# Patient Record
Sex: Female | Born: 1975
Health system: Southern US, Community
[De-identification: ages and names within clinical notes are randomized; demographics above are authoritative.]

## PROBLEM LIST (undated history)

## (undated) ENCOUNTER — Emergency Department (HOSPITAL_COMMUNITY): Admission: EM | Payer: Self-pay | Source: Home / Self Care

## (undated) DIAGNOSIS — E7800 Pure hypercholesterolemia, unspecified: Secondary | ICD-10-CM

## (undated) DIAGNOSIS — F32A Depression, unspecified: Secondary | ICD-10-CM

## (undated) DIAGNOSIS — G510 Bell's palsy: Secondary | ICD-10-CM

## (undated) DIAGNOSIS — J385 Laryngeal spasm: Secondary | ICD-10-CM

## (undated) DIAGNOSIS — I83892 Varicose veins of left lower extremities with other complications: Secondary | ICD-10-CM

## (undated) DIAGNOSIS — D069 Carcinoma in situ of cervix, unspecified: Secondary | ICD-10-CM

## (undated) DIAGNOSIS — K5792 Diverticulitis of intestine, part unspecified, without perforation or abscess without bleeding: Secondary | ICD-10-CM

## (undated) DIAGNOSIS — Z789 Other specified health status: Secondary | ICD-10-CM

## (undated) DIAGNOSIS — C801 Malignant (primary) neoplasm, unspecified: Secondary | ICD-10-CM

## (undated) DIAGNOSIS — F329 Major depressive disorder, single episode, unspecified: Secondary | ICD-10-CM

## (undated) HISTORY — DX: Malignant (primary) neoplasm, unspecified: C80.1

## (undated) HISTORY — DX: Varicose veins of left lower extremity with other complications: I83.892

## (undated) HISTORY — DX: Diverticulitis of intestine, part unspecified, without perforation or abscess without bleeding: K57.92

## (undated) HISTORY — DX: Bell's palsy: G51.0

## (undated) HISTORY — DX: Carcinoma in situ of cervix, unspecified: D06.9

## (undated) HISTORY — DX: Depression, unspecified: F32.A

## (undated) HISTORY — DX: Major depressive disorder, single episode, unspecified: F32.9

---
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
## 2006-07-17 ENCOUNTER — Encounter (HOSPITAL_COMMUNITY): Admission: AD | Admit: 2006-07-17 | Discharge: 2006-07-18 | Payer: Self-pay | Admitting: *Deleted

## 2006-09-29 ENCOUNTER — Ambulatory Visit: Payer: Self-pay | Admitting: Obstetrics & Gynecology

## 2006-10-01 ENCOUNTER — Inpatient Hospital Stay (HOSPITAL_COMMUNITY): Admission: AD | Admit: 2006-10-01 | Discharge: 2006-10-04 | Payer: Self-pay | Admitting: *Deleted

## 2006-10-01 ENCOUNTER — Ambulatory Visit: Payer: Self-pay | Admitting: Obstetrics and Gynecology

## 2006-10-05 ENCOUNTER — Encounter: Payer: Self-pay | Admitting: Vascular Surgery

## 2006-10-17 ENCOUNTER — Ambulatory Visit: Payer: Self-pay | Admitting: *Deleted

## 2006-10-17 ENCOUNTER — Inpatient Hospital Stay (HOSPITAL_COMMUNITY): Admission: AD | Admit: 2006-10-17 | Discharge: 2006-10-18 | Payer: Self-pay | Admitting: Family Medicine

## 2009-12-13 ENCOUNTER — Ambulatory Visit: Payer: Self-pay | Admitting: Obstetrics & Gynecology

## 2009-12-20 ENCOUNTER — Ambulatory Visit (HOSPITAL_COMMUNITY): Admission: RE | Admit: 2009-12-20 | Discharge: 2009-12-20 | Payer: Self-pay | Admitting: Family Medicine

## 2009-12-27 ENCOUNTER — Ambulatory Visit: Payer: Self-pay | Admitting: Obstetrics and Gynecology

## 2011-09-05 LAB — ABO/RH: RH Type: POSITIVE

## 2011-09-05 LAB — HEPATITIS B SURFACE ANTIGEN: Hepatitis B Surface Ag: NEGATIVE

## 2011-09-05 LAB — RPR: RPR: NONREACTIVE

## 2011-09-05 LAB — HIV ANTIBODY (ROUTINE TESTING W REFLEX)
HIV: NONREACTIVE
HIV: NONREACTIVE

## 2011-09-05 LAB — RUBELLA ANTIBODY, IGM: Rubella: IMMUNE

## 2011-09-05 LAB — GC/CHLAMYDIA PROBE AMP, GENITAL
Chlamydia: NEGATIVE
Gonorrhea: NEGATIVE

## 2011-09-05 LAB — ANTIBODY SCREEN: Antibody Screen: NEGATIVE

## 2011-11-18 ENCOUNTER — Inpatient Hospital Stay (HOSPITAL_COMMUNITY): Admission: AD | Admit: 2011-11-18 | Payer: Self-pay | Source: Ambulatory Visit | Admitting: Obstetrics

## 2011-11-19 NOTE — L&D Delivery Note (Signed)
Delivery Note At 2:30 AM a viable female was delivered via Vaginal, Spontaneous Delivery (Presentation: Left Occiput Anterior).  APGAR: 8, 9; weight 7 lb 10 oz (3459 g).   Placenta status: Intact, Spontaneous.  Cord: 3 vessels with the following complications: None.  Cord pH: none  Anesthesia: Local Epidural  Episiotomy: None Lacerations: 2nd degree Suture Repair: 2.0 Est. Blood Loss (mL): 350  Mom to postpartum.  Baby to nursery-stable.  Carlinda Ohlson A 02/03/2012, 2:50 AM

## 2011-12-24 LAB — STREP B DNA PROBE: GBS: NEGATIVE

## 2012-01-21 ENCOUNTER — Encounter (HOSPITAL_COMMUNITY): Payer: Self-pay | Admitting: *Deleted

## 2012-01-21 ENCOUNTER — Inpatient Hospital Stay (HOSPITAL_COMMUNITY)
Admission: AD | Admit: 2012-01-21 | Discharge: 2012-01-21 | Disposition: A | Discharge status: Home / Self Care | Payer: Self-pay | Source: Ambulatory Visit | Attending: Obstetrics | Admitting: Obstetrics

## 2012-01-21 DIAGNOSIS — O368190 Decreased fetal movements, unspecified trimester, not applicable or unspecified: Secondary | ICD-10-CM | POA: Insufficient documentation

## 2012-01-21 HISTORY — DX: Other specified health status: Z78.9

## 2012-01-21 NOTE — Progress Notes (Signed)
Pt states she felt that the baby was moving less yesterday. Pt states she saw the doctor today

## 2012-01-21 NOTE — Discharge Instructions (Signed)
Mtodo para contar los movimientos fetales (Fetal Movement Counts) Nombre de la paciente: __________________________________________________ Fecha probable de parto:____________________ En los embarazos de alto riesgo se recomienda contar las pataditas, pero tambin es una buena idea que lo hagan todas las embarazadas. Comience a contarlas a las 28 semanas de embarazo. Los movimientos fetales aumentan luego de una comida completa o de comer o beber algo dulce (el nivel de azcar en la sangre est ms alto). Tambin es importante beber gran cantidad de lquidos (hidratarse bien) antes de contar. Si se recuesta sobre el lado izquierdo mejorar la circulacin, o puede sentarse en una silla cmoda con los brazos sobre el abdomen y sin distracciones que la rodeen. CONTANDO  Trate de contar a la misma hora todos los das que lo haga.   Marque el da y la hora y vea cunto le lleva sentir 10 movimientos (patadas, agitaciones, sacudones, vueltas). Debe sentir al menos 10 movimientos en 2 horas. Probablemente sienta los 10 movimientos en menos de dos horas. Si no los siente, espere una hora y cuente nuevamente. Luego de algunos das tendr un patrn.   Debemos observar si hay cambios en el patrn o no hay suficientes pataditas en 2 horas. Le lleva ms tiempo contar los 10 movimientos?  SOLICITE ATENCIN MDICA SI:  Siente menos de 10 pataditas en 2 horas. Intntelo dos veces.   No siente movimientos durante 1 hora.   El patrn se modifica o le lleva ms tiempo cada da contar las 10 pataditas.   Siente que el beb no se mueve como lo hace habitualmente.  Fecha: ____________ Movimientos: ____________ Comienzo hora: ____________ Fin hora: ____________ Fecha: ____________ Movimientos: ____________ Comienzo hora: ____________ Fin hora: ____________ Fecha: ____________ Movimientos: ____________ Comienzo hora: ____________ Fin hora: ____________ Fecha: ____________ Movimientos: ____________ Comienzo hora:  ____________ Fin hora: ____________ Fecha: ____________ Movimientos: ____________ Comienzo hora: ____________ Fin hora: ____________ Fecha: ____________ Movimientos: ____________ Comienzo hora: ____________ Fin hora: ____________ Fecha: ____________ Movimientos: ____________ Comienzo hora: ____________ Fin hora: ____________  Fecha: ____________ Movimientos: ____________ Comienzo hora: ____________ Fin hora: ____________ Fecha: ____________ Movimientos: ____________ Comienzo hora: ____________ Fin hora: ____________ Fecha: ____________ Movimientos: ____________ Comienzo hora: ____________ Fin hora: ____________ Fecha: ____________ Movimientos: ____________ Comienzo hora: ____________ Fin hora: ____________ Fecha: ____________ Movimientos: ____________ Comienzo hora: ____________ Fin hora: ____________ Fecha: ____________ Movimientos: ____________ Comienzo hora: ____________ Fin hora: ____________ Fecha: ____________ Movimientos: ____________ Comienzo hora: ____________ Fin hora: ____________  Fecha: ____________ Movimientos: ____________ Comienzo hora: ____________ Fin hora: ____________ Fecha: ____________ Movimientos: ____________ Comienzo hora: ____________ Fin hora: ____________ Fecha: ____________ Movimientos: ____________ Comienzo hora: ____________ Fin hora: ____________ Fecha: ____________ Movimientos: ____________ Comienzo hora: ____________ Fin hora: ____________ Fecha: ____________ Movimientos: ____________ Comienzo hora: ____________ Fin hora: ____________ Fecha: ____________ Movimientos: ____________ Comienzo hora: ____________ Fin hora: ____________ Fecha: ____________ Movimientos: ____________ Comienzo hora: ____________ Fin hora: ____________  Fecha: ____________ Movimientos: ____________ Comienzo hora: ____________ Fin hora: ____________ Fecha: ____________ Movimientos: ____________ Comienzo hora: ____________ Fin hora: ____________ Fecha: ____________ Movimientos: ____________  Comienzo hora: ____________ Fin hora: ____________ Fecha: ____________ Movimientos: ____________ Comienzo hora: ____________ Fin hora: ____________ Fecha: ____________ Movimientos: ____________ Comienzo hora: ____________ Fin hora: ____________ Fecha: ____________ Movimientos: ____________ Comienzo hora: ____________ Fin hora: ____________ Fecha: ____________ Movimientos: ____________ Comienzo hora: ____________ Fin hora: ____________  Fecha: ____________ Movimientos: ____________ Comienzo hora: ____________ Fin hora: ____________ Fecha: ____________ Movimientos: ____________ Comienzo hora: ____________ Fin hora: ____________ Fecha: ____________ Movimientos: ____________ Comienzo hora: ____________ Fin hora: ____________ Fecha: ____________ Movimientos: ____________ Comienzo hora: ____________ Fin hora: ____________ Fecha: ____________ Movimientos:   ____________ Comienzo hora: ____________ Fin hora: ____________ Fecha: ____________ Movimientos: ____________ Comienzo hora: ____________ Fin hora: ____________ Fecha: ____________ Movimientos: ____________ Comienzo hora: ____________ Fin hora: ____________  Fecha: ____________ Movimientos: ____________ Comienzo hora: ____________ Fin hora: ____________ Fecha: ____________ Movimientos: ____________ Comienzo hora: ____________ Fin hora: ____________ Fecha: ____________ Movimientos: ____________ Comienzo hora: ____________ Fin hora: ____________ Fecha: ____________ Movimientos: ____________ Comienzo hora: ____________ Fin hora: ____________ Fecha: ____________ Movimientos: ____________ Comienzo hora: ____________ Fin hora: ____________ Fecha: ____________ Movimientos: ____________ Comienzo hora: ____________ Fin hora: ____________ Fecha: ____________ Movimientos: ____________ Comienzo hora: ____________ Fin hora: ____________  Fecha: ____________ Movimientos: ____________ Comienzo hora: ____________ Fin hora: ____________ Fecha: ____________ Movimientos:  ____________ Comienzo hora: ____________ Fin hora: ____________ Fecha: ____________ Movimientos: ____________ Comienzo hora: ____________ Fin hora: ____________ Fecha: ____________ Movimientos: ____________ Comienzo hora: ____________ Fin hora: ____________ Fecha: ____________ Movimientos: ____________ Comienzo hora: ____________ Fin hora: ____________ Fecha: ____________ Movimientos: ____________ Comienzo hora: ____________ Fin hora: ____________ Fecha: ____________ Movimientos: ____________ Comienzo hora: ____________ Fin hora: ____________  Fecha: ____________ Movimientos: ____________ Comienzo hora: ____________ Fin hora: ____________ Fecha: ____________ Movimientos: ____________ Comienzo hora: ____________ Fin hora: ____________ Fecha: ____________ Movimientos: ____________ Comienzo hora: ____________ Fin hora: ____________ Fecha: ____________ Movimientos: ____________ Comienzo hora: ____________ Fin hora: ____________ Fecha: ____________ Movimientos: ____________ Comienzo hora: ____________ Fin hora: ____________ Fecha: ____________ Movimientos: ____________ Comienzo hora: ____________ Fin hora: ____________ Fecha: ____________ Movimientos: ____________ Comienzo hora: ____________ Fin hora: ____________  Document Released: 02/11/2008 Document Revised: 10/24/2011 [employer] Patient Information 2012 [employer]. 

## 2012-01-21 NOTE — ED Provider Notes (Signed)
History     Chief Complaint  Patient presents with  . Decreased Fetal Movement   HPI This is a 36 y.o. at 39 weeks who presents for NST for history of decreased fetal movment yesterday. Baby is moving well now. Saw Dr Gaynell Face in office today. No contractions, leaking or bleeding.  OB History    Grav Para Term Preterm Abortions TAB SAB Ect Mult Living   2 1 1  0 0 0 0  0 1      Past Medical History  Diagnosis Date  . No pertinent past medical history     Past Surgical History  Procedure Date  . No past surgeries     Family History  Problem Relation Age of Onset  . Diabetes Father   . Cancer Sister     History  Substance Use Topics  . Smoking status: Never Smoker   . Smokeless tobacco: Not on file  . Alcohol Use: No    Allergies: Allergies not on file  No prescriptions prior to admission    ROS As above  Physical Exam   Blood pressure 110/73, pulse 74, temperature 97.3 F (36.3 C), temperature source Oral, resp. rate 18.  Physical Exam  Constitutional: She is oriented to person, place, and time. She appears well-developed and well-nourished. No distress.  HENT:  Head: Normocephalic.  Cardiovascular: Normal rate.   Respiratory: Effort normal.  GI: Soft. She exhibits no distension and no mass. There is no tenderness. There is no rebound and no guarding.  Genitourinary:       FHR reactive with occasional contractions  Musculoskeletal: Normal range of motion.  Neurological: She is alert and oriented to person, place, and time.  Skin: Skin is warm and dry.  Psychiatric: She has a normal mood and affect.    MAU Course  Procedures  Assessment and Plan  A:  SIUP at 39 weeks      Decreased fetal movement yesterday      Reassuring fetal status P:  D/C home      Labor and FM precautions    [facility] 01/21/2012, 5:31 PM

## 2012-01-29 ENCOUNTER — Encounter (HOSPITAL_COMMUNITY): Payer: Self-pay | Admitting: *Deleted

## 2012-01-29 ENCOUNTER — Telehealth (HOSPITAL_COMMUNITY): Payer: Self-pay | Admitting: *Deleted

## 2012-01-29 NOTE — Telephone Encounter (Signed)
Preadmission screen  

## 2012-02-02 ENCOUNTER — Encounter (HOSPITAL_COMMUNITY): Payer: Self-pay | Admitting: *Deleted

## 2012-02-02 ENCOUNTER — Encounter (HOSPITAL_COMMUNITY): Payer: Self-pay | Admitting: Anesthesiology

## 2012-02-02 ENCOUNTER — Inpatient Hospital Stay (HOSPITAL_COMMUNITY): Payer: Medicaid Other | Admitting: Anesthesiology

## 2012-02-02 ENCOUNTER — Inpatient Hospital Stay (HOSPITAL_COMMUNITY)
Admission: AD | Admit: 2012-02-02 | Discharge: 2012-02-04 | DRG: 775 | Disposition: A | Discharge status: Home / Self Care | Payer: Medicaid Other | Source: Ambulatory Visit | Attending: Obstetrics | Admitting: Obstetrics

## 2012-02-02 LAB — CBC
HCT: 42.1 % (ref 36.0–46.0)
Hemoglobin: 14.6 g/dL (ref 12.0–15.0)
MCH: 29.7 pg (ref 26.0–34.0)
MCHC: 34.7 g/dL (ref 30.0–36.0)
MCV: 85.6 fL (ref 78.0–100.0)
Platelets: 241 10*3/uL (ref 150–400)
RBC: 4.92 MIL/uL (ref 3.87–5.11)
RDW: 14.5 % (ref 11.5–15.5)
WBC: 9 10*3/uL (ref 4.0–10.5)

## 2012-02-02 MED ORDER — LACTATED RINGERS IV SOLN
500.0000 mL | INTRAVENOUS | Status: DC | PRN
  Administered 2012-02-02: 500 mL via INTRAVENOUS

## 2012-02-02 MED ORDER — NALBUPHINE SYRINGE 5 MG/0.5 ML: 10.0000 mg | INJECTION | INTRAMUSCULAR | Status: DC | PRN

## 2012-02-02 MED ORDER — EPHEDRINE 5 MG/ML INJ: 10.0000 mg | INTRAVENOUS | Status: DC | PRN

## 2012-02-02 MED ORDER — NALBUPHINE HCL 10 MG/ML IJ SOLN: 10.0000 mg | Freq: Four times a day (QID) | INTRAMUSCULAR | Status: DC | PRN

## 2012-02-02 MED ORDER — ONDANSETRON HCL 4 MG/2ML IJ SOLN: 4.0000 mg | Freq: Four times a day (QID) | INTRAMUSCULAR | Status: DC | PRN

## 2012-02-02 MED ORDER — OXYCODONE-ACETAMINOPHEN 5-325 MG PO TABS
1.0000 | ORAL_TABLET | ORAL | Status: DC | PRN
  Administered 2012-02-03: 1 via ORAL
  Filled 2012-02-02: qty 1

## 2012-02-02 MED ORDER — DIPHENHYDRAMINE HCL 50 MG/ML IJ SOLN: 12.5000 mg | INTRAMUSCULAR | Status: DC | PRN

## 2012-02-02 MED ORDER — LACTATED RINGERS IV SOLN
INTRAVENOUS | Status: DC
  Administered 2012-02-02: 125 mL/h via INTRAVENOUS

## 2012-02-02 MED ORDER — HYDROXYZINE HCL 50 MG/ML IM SOLN: 50.0000 mg | Freq: Four times a day (QID) | INTRAMUSCULAR | Status: DC | PRN

## 2012-02-02 MED ORDER — LACTATED RINGERS IV SOLN
500.0000 mL | Freq: Once | INTRAVENOUS | Status: AC
  Administered 2012-02-03: 500 mL via INTRAVENOUS

## 2012-02-02 MED ORDER — FENTANYL 2.5 MCG/ML BUPIVACAINE 1/10 % EPIDURAL INFUSION (WH - ANES)
14.0000 mL/h | INTRAMUSCULAR | Status: DC
  Administered 2012-02-02: 14 mL/h via EPIDURAL
  Filled 2012-02-02 (×2): qty 60

## 2012-02-02 MED ORDER — IBUPROFEN 600 MG PO TABS: 600.0000 mg | ORAL_TABLET | Freq: Four times a day (QID) | ORAL | Status: DC | PRN

## 2012-02-02 MED ORDER — OXYTOCIN BOLUS FROM INFUSION
500.0000 mL | Freq: Once | INTRAVENOUS | Status: DC
  Filled 2012-02-02: qty 500
  Filled 2012-02-02: qty 1000

## 2012-02-02 MED ORDER — OXYTOCIN 20 UNITS IN LACTATED RINGERS INFUSION - SIMPLE: 125.0000 mL/h | Freq: Once | INTRAVENOUS | Status: DC

## 2012-02-02 MED ORDER — FLEET ENEMA 7-19 GM/118ML RE ENEM: 1.0000 | ENEMA | RECTAL | Status: DC | PRN

## 2012-02-02 MED ORDER — LIDOCAINE HCL (PF) 1 % IJ SOLN
30.0000 mL | INTRAMUSCULAR | Status: DC | PRN
  Administered 2012-02-03: 30 mL via SUBCUTANEOUS
  Filled 2012-02-02: qty 30

## 2012-02-02 MED ORDER — LIDOCAINE HCL (PF) 1 % IJ SOLN
INTRAMUSCULAR | Status: DC | PRN
  Administered 2012-02-02 (×3): 4 mL

## 2012-02-02 MED ORDER — EPHEDRINE 5 MG/ML INJ
10.0000 mg | INTRAVENOUS | Status: DC | PRN
  Filled 2012-02-02: qty 4

## 2012-02-02 MED ORDER — CITRIC ACID-SODIUM CITRATE 334-500 MG/5ML PO SOLN: 30.0000 mL | ORAL | Status: DC | PRN

## 2012-02-02 MED ORDER — ACETAMINOPHEN 325 MG PO TABS: 650.0000 mg | ORAL_TABLET | ORAL | Status: DC | PRN

## 2012-02-02 MED ORDER — PHENYLEPHRINE 40 MCG/ML (10ML) SYRINGE FOR IV PUSH (FOR BLOOD PRESSURE SUPPORT): 80.0000 ug | PREFILLED_SYRINGE | INTRAVENOUS | Status: DC | PRN

## 2012-02-02 MED ORDER — PHENYLEPHRINE 40 MCG/ML (10ML) SYRINGE FOR IV PUSH (FOR BLOOD PRESSURE SUPPORT)
80.0000 ug | PREFILLED_SYRINGE | INTRAVENOUS | Status: DC | PRN
  Filled 2012-02-02: qty 5

## 2012-02-02 NOTE — MAU Note (Signed)
Pt G2 P1 at 41wks, having contractions every 6-75min since this am.  Denies leaking.  Reports small amt of bloody show this am.  Denies any problems with pregnancy.

## 2012-02-02 NOTE — Anesthesia Preprocedure Evaluation (Signed)
Anesthesia Evaluation  Patient identified by MRN, date of birth, ID band Patient awake    Reviewed: Allergy & Precautions, H&P , NPO status , Patient's Chart, lab work & pertinent test results, reviewed documented beta blocker date and time   History of Anesthesia Complications Negative for: history of anesthetic complications  Airway Mallampati: I TM Distance: >3 FB Neck ROM: full    Dental  (+) Teeth Intact   Pulmonary neg pulmonary ROS,  breath sounds clear to auscultation        Cardiovascular negative cardio ROS  Rhythm:regular Rate:Normal     Neuro/Psych negative neurological ROS  negative psych ROS   GI/Hepatic negative GI ROS, Neg liver ROS,   Endo/Other  negative endocrine ROS  Renal/GU negative Renal ROS     Musculoskeletal   Abdominal   Peds  Hematology negative hematology ROS (+)   Anesthesia Other Findings   Reproductive/Obstetrics (+) Pregnancy                           Anesthesia Physical Anesthesia Plan  ASA: II  Anesthesia Plan: Epidural   Post-op Pain Management:    Induction:   Airway Management Planned:   Additional Equipment:   Intra-op Plan:   Post-operative Plan:   Informed Consent: I have reviewed the patients History and Physical, chart, labs and discussed the procedure including the risks, benefits and alternatives for the proposed anesthesia with the patient or authorized representative who has indicated his/her understanding and acceptance.     Plan Discussed with:   Anesthesia Plan Comments:         Anesthesia Quick Evaluation  

## 2012-02-02 NOTE — Progress Notes (Signed)
Dr. Clearance Coots on the phone and notified of pt status, epidural placement, FHR, elevated baseline, maternal temp WNL, UC pattern, and SVE.

## 2012-02-02 NOTE — Anesthesia Procedure Notes (Signed)
Epidural Patient location during procedure: OB Start time: 02/02/2012 9:33 PM Reason for block: procedure for pain  Staffing Performed by: anesthesiologist   Preanesthetic Checklist Completed: patient identified, site marked, surgical consent, pre-op evaluation, timeout performed, IV checked, risks and benefits discussed and monitors and equipment checked  Epidural Patient position: sitting Prep: site prepped and draped and DuraPrep Patient monitoring: continuous pulse ox and blood pressure Approach: midline Injection technique: LOR air  Needle:  Needle type: Tuohy  Needle gauge: 17 G Needle length: 9 cm Needle insertion depth: 4 cm Catheter type: closed end flexible Catheter size: 19 Gauge Catheter at skin depth: 9 cm Test dose: negative  Assessment Events: blood not aspirated, injection not painful, no injection resistance, negative IV test and no paresthesia  Additional Notes Discussed risk of headache, infection, bleeding, nerve injury and failed or incomplete block.  Patient voices understanding and wishes to proceed.

## 2012-02-03 ENCOUNTER — Encounter (HOSPITAL_COMMUNITY): Payer: Self-pay | Admitting: Family Medicine

## 2012-02-03 ENCOUNTER — Inpatient Hospital Stay (HOSPITAL_COMMUNITY): Admission: RE | Admit: 2012-02-03 | Payer: Self-pay | Source: Ambulatory Visit

## 2012-02-03 LAB — CBC
HCT: 36.8 % (ref 36.0–46.0)
Hemoglobin: 12.6 g/dL (ref 12.0–15.0)
MCH: 29.6 pg (ref 26.0–34.0)
MCHC: 34.2 g/dL (ref 30.0–36.0)
MCV: 86.4 fL (ref 78.0–100.0)
Platelets: 198 10*3/uL (ref 150–400)
RBC: 4.26 MIL/uL (ref 3.87–5.11)
RDW: 14.2 % (ref 11.5–15.5)
WBC: 12.5 10*3/uL — ABNORMAL HIGH (ref 4.0–10.5)

## 2012-02-03 LAB — RPR: RPR Ser Ql: NONREACTIVE

## 2012-02-03 MED ORDER — ONDANSETRON HCL 4 MG PO TABS: 4.0000 mg | ORAL_TABLET | ORAL | Status: DC | PRN

## 2012-02-03 MED ORDER — TETANUS-DIPHTH-ACELL PERTUSSIS 5-2.5-18.5 LF-MCG/0.5 IM SUSP
0.5000 mL | Freq: Once | INTRAMUSCULAR | Status: AC
  Administered 2012-02-04: 0.5 mL via INTRAMUSCULAR
  Filled 2012-02-03: qty 0.5

## 2012-02-03 MED ORDER — PRENATAL MULTIVITAMIN CH
1.0000 | ORAL_TABLET | Freq: Every day | ORAL | Status: DC
  Administered 2012-02-03 – 2012-02-04 (×2): 1 via ORAL
  Filled 2012-02-03 (×2): qty 1

## 2012-02-03 MED ORDER — IBUPROFEN 600 MG PO TABS
600.0000 mg | ORAL_TABLET | Freq: Four times a day (QID) | ORAL | Status: DC
  Administered 2012-02-03 – 2012-02-04 (×6): 600 mg via ORAL
  Filled 2012-02-03 (×6): qty 1

## 2012-02-03 MED ORDER — SIMETHICONE 80 MG PO CHEW: 80.0000 mg | CHEWABLE_TABLET | ORAL | Status: DC | PRN

## 2012-02-03 MED ORDER — DIBUCAINE 1 % RE OINT
1.0000 "application " | TOPICAL_OINTMENT | RECTAL | Status: DC | PRN
  Administered 2012-02-03: 1 via RECTAL
  Filled 2012-02-03 (×2): qty 28

## 2012-02-03 MED ORDER — METHYLERGONOVINE MALEATE 0.2 MG PO TABS: 0.2000 mg | ORAL_TABLET | ORAL | Status: DC | PRN

## 2012-02-03 MED ORDER — MEDROXYPROGESTERONE ACETATE 150 MG/ML IM SUSP: 150.0000 mg | INTRAMUSCULAR | Status: DC | PRN

## 2012-02-03 MED ORDER — OXYCODONE-ACETAMINOPHEN 5-325 MG PO TABS
1.0000 | ORAL_TABLET | ORAL | Status: DC | PRN
  Administered 2012-02-03: 1 via ORAL
  Filled 2012-02-03: qty 1

## 2012-02-03 MED ORDER — OXYTOCIN 10 UNIT/ML IJ SOLN
40.0000 [IU] | INTRAVENOUS | Status: DC
  Administered 2012-02-03: 40 [IU] via INTRAVENOUS
  Filled 2012-02-03: qty 4

## 2012-02-03 MED ORDER — BENZOCAINE-MENTHOL 20-0.5 % EX AERO
INHALATION_SPRAY | CUTANEOUS | Status: AC
  Administered 2012-02-03: 1 via TOPICAL
  Filled 2012-02-03: qty 56

## 2012-02-03 MED ORDER — METHYLERGONOVINE MALEATE 0.2 MG/ML IJ SOLN: 0.2000 mg | INTRAMUSCULAR | Status: DC | PRN

## 2012-02-03 MED ORDER — ZOLPIDEM TARTRATE 5 MG PO TABS: 5.0000 mg | ORAL_TABLET | Freq: Every evening | ORAL | Status: DC | PRN

## 2012-02-03 MED ORDER — WITCH HAZEL-GLYCERIN EX PADS: 1.0000 "application " | MEDICATED_PAD | CUTANEOUS | Status: DC | PRN

## 2012-02-03 MED ORDER — ONDANSETRON HCL 4 MG/2ML IJ SOLN: 4.0000 mg | INTRAMUSCULAR | Status: DC | PRN

## 2012-02-03 MED ORDER — OXYTOCIN 20 UNITS IN LACTATED RINGERS INFUSION - SIMPLE: 125.0000 mL/h | INTRAVENOUS | Status: DC | PRN

## 2012-02-03 MED ORDER — SENNOSIDES-DOCUSATE SODIUM 8.6-50 MG PO TABS
2.0000 | ORAL_TABLET | Freq: Every day | ORAL | Status: DC
  Administered 2012-02-03: 2 via ORAL

## 2012-02-03 MED ORDER — DIPHENHYDRAMINE HCL 25 MG PO CAPS: 25.0000 mg | ORAL_CAPSULE | Freq: Four times a day (QID) | ORAL | Status: DC | PRN

## 2012-02-03 MED ORDER — LANOLIN HYDROUS EX OINT: TOPICAL_OINTMENT | CUTANEOUS | Status: DC | PRN

## 2012-02-03 MED ORDER — BENZOCAINE-MENTHOL 20-0.5 % EX AERO
1.0000 "application " | INHALATION_SPRAY | CUTANEOUS | Status: DC | PRN
  Administered 2012-02-03: 1 via TOPICAL
  Filled 2012-02-03: qty 56

## 2012-02-03 NOTE — Progress Notes (Signed)
Catrice Zuleta is a 36 y.o. G2P1001 at [redacted]w[redacted]d by LMP admitted for active labor  Subjective:   Objective: BP 132/87  Pulse 71  Temp(Src) 97.9 F (36.6 C) (Oral)  Resp 18  Ht 5\' 2"  (1.575 m)  Wt 138 lb (62.596 kg)  BMI 25.24 kg/m2  SpO2 100%   Total I/O In: -  Out: 600 [Urine:600]  FHT:  FHR: 150 bpm, variability: moderate,  accelerations:  Present,  decelerations:  Absent UC:   regular, every 2-5 minutes SVE:   Dilation: 10 Effacement (%): 90 Station: +2 Exam by:: Dr. Clearance Coots  Labs: Lab Results  Component Value Date   WBC 9.0 02/02/2012   HGB 14.6 02/02/2012   HCT 42.1 02/02/2012   MCV 85.6 02/02/2012   PLT 241 02/02/2012    Assessment / Plan: Spontaneous labor, progressing normally  Labor: Progressing normally Preeclampsia:  n/a Fetal Wellbeing:  Category I Pain Control:  Epidural I/D:  n/a Anticipated MOD:  NSVD  Giordan Fordham A 02/03/2012, 2:21 AM

## 2012-02-03 NOTE — Progress Notes (Signed)
NSVD of a viable female.  

## 2012-02-03 NOTE — Anesthesia Postprocedure Evaluation (Signed)
  Anesthesia Post-op Note  Patient: Alicia Miller  Procedure(s) Performed: * No surgery found *  Patient Location: Mother/Baby  Anesthesia Type: Epidural  Level of Consciousness: awake  Airway and Oxygen Therapy: Patient Spontanous Breathing  Post-op Pain: none  Post-op Assessment: Post-op Vital signs reviewed, Respiratory Function Stable, No signs of Nausea or vomiting, Adequate PO intake, Pain level controlled, No headache, No backache, No residual numbness and No residual motor weakness  Post-op Vital Signs: Reviewed and stable  Complications: No apparent anesthesia complications

## 2012-02-03 NOTE — H&P (Signed)
Alicia Miller is a 36 y.o. female presenting for UC's.. Maternal Medical History:  Reason for admission: Reason for admission: contractions.  Contractions: Onset was 3-5 hours ago.   Frequency: regular.   Duration is approximately 1 minute.   Perceived severity is moderate.    Fetal activity: Perceived fetal activity is normal.   Last perceived fetal movement was within the past hour.    Prenatal complications: no prenatal complications   OB History    Grav Para Term Preterm Abortions TAB SAB Ect Mult Living   2 1 1  0 0 0 0  0 1     Past Medical History  Diagnosis Date  . No pertinent past medical history    Past Surgical History  Procedure Date  . No past surgeries    Family History: family history includes Cancer in her sister and Diabetes in her father. Social History:  reports that she has never smoked. She has never used smokeless tobacco. She reports that she does not drink alcohol or use illicit drugs.  Review of Systems  All other systems reviewed and are negative.    Dilation: 10 Effacement (%): 90 Station: +1 Exam by:: T. Lessard RN Blood pressure 132/87, pulse 71, temperature 97.9 F (36.6 C), temperature source Oral, resp. rate 18, height 5\' 2"  (1.575 m), weight 138 lb (62.596 kg), SpO2 100.00%. Maternal Exam:  Uterine Assessment: Contraction strength is moderate.  Contraction duration is 1 minute. Contraction frequency is regular.   Abdomen: Patient reports no abdominal tenderness. Fetal presentation: vertex  Introitus: Normal vulva. Normal vagina.    Physical Exam  Nursing note and vitals reviewed. Constitutional: She appears well-developed and well-nourished.  Cardiovascular: Normal rate.   Respiratory: Effort normal.  GI: Soft.  Genitourinary: Vagina normal and uterus normal.  Skin: Skin is warm and dry.  Psychiatric: She has a normal mood and affect. Her behavior is normal. Judgment and thought content normal.    Prenatal labs: ABO,  Rh: B/Positive/-- (10/18 0000) Antibody: Negative (10/18 0000) Rubella: Immune (10/18 0000) RPR: NON REACTIVE (03/17 2034)  HBsAg: Negative (10/18 0000)  HIV: Non-reactive, Non-reactive (10/18 0000)  GBS: Negative (02/05 0000)   Assessment/Plan: Term pregnancy.  Active labor.  Expectant.   Alicia Miller A 02/03/2012, 2:18 AM

## 2012-02-03 NOTE — Progress Notes (Signed)
UR chart review completed.  

## 2012-02-04 NOTE — Discharge Instructions (Signed)
Discharge instructions   You can wash your hair  Shower  Eat what you want  Drink what you want  See me in 6 weeks  Your ankles are going to swell more in the next 2 weeks than when pregnant  No sex for 6 weeks   Neva Ramaswamy A, MD 02/04/2012

## 2012-02-04 NOTE — Discharge Summary (Signed)
Obstetric Discharge Summary Reason for Admission: onset of labor Prenatal Procedures: none Intrapartum Procedures: spontaneous vaginal delivery Postpartum Procedures: none Complications-Operative and Postpartum: none Hemoglobin  Date Value Range Status  02/03/2012 12.6  12.0-15.0 (g/dL) Final     HCT  Date Value Range Status  02/03/2012 36.8  36.0-46.0 (%) Final    Physical Exam:  General: alert Lochia: appropriate Uterine Fundus: firm Incision: healing well DVT Evaluation: No evidence of DVT seen on physical exam.  Discharge Diagnoses: Term Pregnancy-delivered  Discharge Information: Date: 02/04/2012 Activity: pelvic rest Diet: routine Medications: Percocet Condition: stable Instructions: refer to practice specific booklet Discharge to: home Follow-up Information    Follow up with Amadu Schlageter A, MD. Call in 6 weeks.   Contact information:   [address] [place] [address] [phone]          Newborn Data: Live born female  Birth Weight: 7 lb 10 oz (3459 g) APGAR: 8, 9  Home with mother.  Eural Holzschuh A 02/04/2012, 6:47 AM

## 2012-11-30 ENCOUNTER — Ambulatory Visit: Payer: Self-pay | Admitting: Family Medicine

## 2012-11-30 VITALS — BP 100/70 | HR 81 | Temp 98.6°F | Resp 18 | Wt 123.0 lb

## 2012-11-30 DIAGNOSIS — R21 Rash and other nonspecific skin eruption: Secondary | ICD-10-CM

## 2012-11-30 DIAGNOSIS — R0789 Other chest pain: Secondary | ICD-10-CM

## 2012-11-30 DIAGNOSIS — I776 Arteritis, unspecified: Secondary | ICD-10-CM

## 2012-11-30 LAB — POCT CBC
Granulocyte percent: 75 %G (ref 37–80)
HCT, POC: 48.5 % — AB (ref 37.7–47.9)
Hemoglobin: 15.4 g/dL (ref 12.2–16.2)
Lymph, poc: 2.5 (ref 0.6–3.4)
MCH, POC: 28.6 pg (ref 27–31.2)
MCHC: 31.8 g/dL (ref 31.8–35.4)
MCV: 90.1 fL (ref 80–97)
MID (cbc): 0.6 (ref 0–0.9)
MPV: 8.4 fL (ref 0–99.8)
POC Granulocyte: 9.3 — AB (ref 2–6.9)
POC LYMPH PERCENT: 19.9 %L (ref 10–50)
POC MID %: 5.1 %M (ref 0–12)
Platelet Count, POC: 475 10*3/uL — AB (ref 142–424)
RBC: 4.38 M/uL (ref 4.04–5.48)
RDW, POC: 12.4 %
WBC: 12.4 10*3/uL — AB (ref 4.6–10.2)

## 2012-11-30 LAB — POCT SEDIMENTATION RATE: POCT SED RATE: 41 mm/hr — AB (ref 0–22)

## 2012-11-30 MED ORDER — PREDNISONE 20 MG PO TABS: ORAL_TABLET | ORAL | Status: DC

## 2012-11-30 NOTE — Progress Notes (Signed)
Subjective: Patient is here with a rash on her legs it is been there for several weeks. His gotten gotten worse. It itches. Her legs now pain. She thought she gotten bit by her neck on her shoulder by some spiders. She had a couple of bumps there apparently. There is nothing left that I can see from that. She never saw a spider. She also is been having chest pain behind the left breast. Has been going on for an indefinite long time. It hurts sometimes when she takes a deep breath. It seems to be a stabbing pain. She did not speak English very well, but little he helps translate as did the patient's friend who was with her.  Objective: Shows a striking vasculitic-appearing rash on her ankles. Not much on the feet. No the palms or soles. The worst is on the left ankle. A photo was taken. She has little splotches on the upper leg. The big biggest area shows some linear excoriations. There is no blanching of the rash. No nodes were noted. She has a few scattered little spots is caused her groin. None on her chest or back. Her left arm may be starting to get some, a little difficult to tell.  Her chest is clear. Heart regular without murmurs. Mild tenderness behind the left breast, above the nipple line.  Assessment: Vasculitic rash Atypical chest pain History of present possible spider bites  Plan: Await for additional labs to return. In the meanwhile went ahead and placed on prednisone tapered dose to help with pain and maybe the rash. She may well require referral to a specialist, which cannot afford. Suggested that they consider getting on Obamacare.       Results for orders placed in visit on 11/30/12  POCT CBC      Component Value Range   WBC 12.4 (*) 4.6 - 10.2 K/uL   Lymph, poc 2.5  0.6 - 3.4   POC LYMPH PERCENT 19.9  10 - 50 %L   MID (cbc) 0.6  0 - 0.9   POC MID % 5.1  0 - 12 %M   POC Granulocyte 9.3 (*) 2 - 6.9   Granulocyte percent 75.0  37 - 80 %G   RBC 4.38  4.04 - 5.48 M/uL   Hemoglobin 15.4  12.2 - 16.2 g/dL   HCT, POC 16.1 (*) 09.6 - 47.9 %   MCV 90.1  80 - 97 fL   MCH, POC 28.6  27 - 31.2 pg   MCHC 31.8  31.8 - 35.4 g/dL   RDW, POC 04.5     Platelet Count, POC 475 (*) 142 - 424 K/uL   MPV 8.4  0 - 99.8 fL

## 2012-11-30 NOTE — Patient Instructions (Signed)
Take the prednisone as directed for the vasculitis rash.  Return in one week for recheck, sooner if worse

## 2012-12-01 LAB — COMPREHENSIVE METABOLIC PANEL
ALT: 9 U/L (ref 0–35)
AST: 12 U/L (ref 0–37)
Albumin: 4.6 g/dL (ref 3.5–5.2)
Alkaline Phosphatase: 58 U/L (ref 39–117)
BUN: 17 mg/dL (ref 6–23)
CO2: 24 mEq/L (ref 19–32)
Calcium: 9.7 mg/dL (ref 8.4–10.5)
Chloride: 103 mEq/L (ref 96–112)
Creat: 0.66 mg/dL (ref 0.50–1.10)
Glucose, Bld: 96 mg/dL (ref 70–99)
Potassium: 4.4 mEq/L (ref 3.5–5.3)
Sodium: 138 mEq/L (ref 135–145)
Total Bilirubin: 0.4 mg/dL (ref 0.3–1.2)
Total Protein: 7.6 g/dL (ref 6.0–8.3)

## 2012-12-01 LAB — ANA: Anti Nuclear Antibody(ANA): NEGATIVE

## 2012-12-07 ENCOUNTER — Ambulatory Visit: Payer: Self-pay | Admitting: Family Medicine

## 2012-12-07 VITALS — BP 106/74 | HR 92 | Temp 98.4°F | Resp 16 | Ht 63.0 in | Wt 124.4 lb

## 2012-12-07 DIAGNOSIS — F419 Anxiety disorder, unspecified: Secondary | ICD-10-CM

## 2012-12-07 DIAGNOSIS — F411 Generalized anxiety disorder: Secondary | ICD-10-CM

## 2012-12-07 DIAGNOSIS — R21 Rash and other nonspecific skin eruption: Secondary | ICD-10-CM

## 2012-12-07 NOTE — Progress Notes (Signed)
Subjective: Patient is here for recheck of the rash on her legs. She still has concerns about the rash. She is getting her friend to interpret further in Spanish on the phone. Patient reports having been a little bit lightheaded and some tingling in her mouth.  Objective: Rash is much improved. On a mild brownish discoloration remaining in the area that was so inflamed. Her previous sedimentation rate was slightly high at 41 and the CBC had a slight elevated white blood count, both of which would be expected with the degree of inflammation that she had then. Do not believe that other labs are needed at this time. Chest is clear. Heart regular. No carotid bruits.  Assessment: Dermatitis, probably allergic, etiology not totally defined, resolving. Anxiety  Plan: I do not feel like any further testing or biopsies are indicated at this time. We'll just watch and see if anything comes back.

## 2012-12-07 NOTE — Patient Instructions (Addendum)
Return if worse or if rash reoccurs.

## 2013-01-08 ENCOUNTER — Ambulatory Visit: Payer: Self-pay | Admitting: Internal Medicine

## 2013-01-08 VITALS — BP 99/61 | HR 74 | Temp 98.6°F | Resp 18 | Wt 124.0 lb

## 2013-01-08 DIAGNOSIS — R109 Unspecified abdominal pain: Secondary | ICD-10-CM

## 2013-01-08 DIAGNOSIS — R1013 Epigastric pain: Secondary | ICD-10-CM

## 2013-01-08 LAB — POCT UA - MICROSCOPIC ONLY
Bacteria, U Microscopic: NEGATIVE
Casts, Ur, LPF, POC: NEGATIVE
Crystals, Ur, HPF, POC: NEGATIVE
Mucus, UA: NEGATIVE
WBC, Ur, HPF, POC: NEGATIVE
Yeast, UA: NEGATIVE

## 2013-01-08 LAB — POCT CBC
Granulocyte percent: 77 %G (ref 37–80)
HCT, POC: 46.4 % (ref 37.7–47.9)
Hemoglobin: 14.8 g/dL (ref 12.2–16.2)
Lymph, poc: 1.5 (ref 0.6–3.4)
MCH, POC: 28.5 pg (ref 27–31.2)
MCHC: 31.9 g/dL (ref 31.8–35.4)
MCV: 89.2 fL (ref 80–97)
MID (cbc): 0.5 (ref 0–0.9)
MPV: 9.3 fL (ref 0–99.8)
POC Granulocyte: 6.8 (ref 2–6.9)
POC LYMPH PERCENT: 17.3 %L (ref 10–50)
POC MID %: 5.7 %M (ref 0–12)
Platelet Count, POC: 378 10*3/uL (ref 142–424)
RBC: 5.2 M/uL (ref 4.04–5.48)
RDW, POC: 13.2 %
WBC: 8.8 10*3/uL (ref 4.6–10.2)

## 2013-01-08 LAB — POCT URINALYSIS DIPSTICK
Bilirubin, UA: NEGATIVE
Glucose, UA: NEGATIVE
Ketones, UA: NEGATIVE
Leukocytes, UA: NEGATIVE
Nitrite, UA: NEGATIVE
Protein, UA: NEGATIVE
Spec Grav, UA: 1.01
Urobilinogen, UA: 0.2
pH, UA: 7

## 2013-01-08 MED ORDER — MELOXICAM 15 MG PO TABS: 15.0000 mg | ORAL_TABLET | Freq: Every day | ORAL | Status: DC

## 2013-01-08 MED ORDER — CYCLOBENZAPRINE HCL 10 MG PO TABS: 10.0000 mg | ORAL_TABLET | Freq: Every day | ORAL | Status: DC

## 2013-01-08 NOTE — Progress Notes (Signed)
Subjective:    Patient ID: Alicia Miller, female    DOB: 1976-07-08, 37 y.o.   MRN: [id number]  HPI  Patient comes in today with pain in her upper abdominal area that connects straight through her back. She denies any nausea or vomiting. No changes in her diet. She states it is just a constant pain. It does not get better of worse. She is unable to sleep well. She thinks it may be from eating spicy foods frequently. It hurts to take a deep breath. There is more pain in her back than in the abdomen with movement. She feels more pain if the area is pressed on. If she twists her waist she feels the pain sharply. Pain with and without movement.   She has a sore throat also. It hurts to swallow sometimes. It has been hurting for a while.   She has had complaints of headaches especially at night. No chills, night sweats, fever.   Review of Systems  Constitutional: Negative for fever, chills, appetite change, fatigue and unexpected weight change.  HENT: Negative for congestion, rhinorrhea, sneezing, neck pain, neck stiffness, voice change and postnasal drip.   Gastrointestinal: Negative for nausea, vomiting, diarrhea, constipation and blood in stool.  Genitourinary: Negative for dysuria and frequency.       Objective:   Physical Exam BP 99/61  Pulse 74  Temp(Src) 98.6 F (37 C) (Oral)  Resp 18  Wt 124 lb (56.246 kg)  BMI 21.97 kg/m2  LMP 12/24/2012  Breastfeeding? No HEENT= clear No nodes or thyromegaly Heart regular without murmur Lungs clear Abdomen soft/tender to palpation in the epigastrium/no rebound tenderness/no percussion tenderness  Also tender along the lower rib margins bilaterally to the mid thoracic area/rotation increases  this discomfort/bowel sounds present/no organomegaly/lower quadrants clear Mild flank tenderness to CVA percussion bilaterally Straight leg raise negative Lumbosacral area nontender Skin clear       Results for orders placed in visit on  01/08/13  POCT CBC      Result Value Range   WBC 8.8  4.6 - 10.2 K/uL   Lymph, poc 1.5  0.6 - 3.4   POC LYMPH PERCENT 17.3  10 - 50 %L   MID (cbc) 0.5  0 - 0.9   POC MID % 5.7  0 - 12 %M   POC Granulocyte 6.8  2 - 6.9   Granulocyte percent 77.0  37 - 80 %G   RBC 5.20  4.04 - 5.48 M/uL   Hemoglobin 14.8  12.2 - 16.2 g/dL   HCT, POC 30.8  65.7 - 47.9 %   MCV 89.2  80 - 97 fL   MCH, POC 28.5  27 - 31.2 pg   MCHC 31.9  31.8 - 35.4 g/dL   RDW, POC 84.6     Platelet Count, POC 378  142 - 424 K/uL   MPV 9.3  0 - 99.8 fL  POCT URINALYSIS DIPSTICK      Result Value Range   Color, UA yellow     Clarity, UA clear     Glucose, UA neg     Bilirubin, UA neg     Ketones, UA neg     Spec Grav, UA 1.010     Blood, UA trace-lysed     pH, UA 7.0     Protein, UA neg     Urobilinogen, UA 0.2     Nitrite, UA neg     Leukocytes, UA Negative    POCT UA - MICROSCOPIC  ONLY      Result Value Range   WBC, Ur, HPF, POC neg     RBC, urine, microscopic 0-1     Bacteria, U Microscopic neg     Mucus, UA neg     Epithelial cells, urine per micros 0-1     Crystals, Ur, HPF, POC neg     Casts, Ur, LPF, POC neg     Yeast, UA neg       Assessment & Plan:  Epigastric abdominal pain Flank pain Thoracic pain-musculoskeletal  Meds ordered this encounter  Medications  . meloxicam (MOBIC) 15 MG tablet    Sig: Take 1 tablet (15 mg total) by mouth daily.    Dispense:  14 tablet    Refill:  0  . cyclobenzaprine (FLEXERIL) 10 MG tablet    Sig: Take 1 tablet (10 mg total) by mouth at bedtime.    Dispense:  14 tablet    Refill:  0     Etiology uncertain/pain characteristics favor a musculoskeletal but history favors gastrointestinal Will need close followup/recheck Tues At 4 PM-sooner if worse/we will have Spanish speakers in the building that afternoon I discussed this with her friend translating by phone

## 2014-01-04 ENCOUNTER — Ambulatory Visit: Payer: Medicaid Other

## 2014-01-05 ENCOUNTER — Ambulatory Visit: Payer: No Typology Code available for payment source | Attending: Internal Medicine

## 2014-01-20 ENCOUNTER — Encounter: Payer: Self-pay | Admitting: Family Medicine

## 2014-01-20 ENCOUNTER — Ambulatory Visit: Payer: No Typology Code available for payment source | Attending: Internal Medicine | Admitting: Family Medicine

## 2014-01-20 VITALS — BP 117/79 | HR 74 | Temp 98.0°F | Resp 14 | Ht 62.0 in | Wt 127.2 lb

## 2014-01-20 DIAGNOSIS — R209 Unspecified disturbances of skin sensation: Secondary | ICD-10-CM

## 2014-01-20 DIAGNOSIS — G589 Mononeuropathy, unspecified: Secondary | ICD-10-CM | POA: Insufficient documentation

## 2014-01-20 DIAGNOSIS — R109 Unspecified abdominal pain: Secondary | ICD-10-CM | POA: Insufficient documentation

## 2014-01-20 DIAGNOSIS — Z008 Encounter for other general examination: Secondary | ICD-10-CM | POA: Insufficient documentation

## 2014-01-20 DIAGNOSIS — R200 Anesthesia of skin: Secondary | ICD-10-CM

## 2014-01-20 NOTE — Progress Notes (Signed)
Subjective:     Patient ID: Giuliana Handyside, female   DOB: January 27, 1976, 38 y.o.   MRN: [id number]  HPI Mrs [name] presents today with acute abdominal pain. Her pain is located in her left upper quadrant and started the night of 01-19-14 and she describes it as sharp. It has no aggravating factors and is relieved by rest. She currently rates her pain as a 3-5 and improving. She denies any nausea or vomiting associated with the pain. No fevers.   She states when she awoke this morning she was also experiencing numbness around the lips and in the left side of her hands. She denies any tingling.   Review of Systems Her review of systems was negative for any other symptoms.    Objective:   Physical Exam Heart: RRR S1, S2 Respiratory: Clear Abdomen: Soft nontender, nondistended, and normoactive bowel sounds. No splenomegaly Neuro - arms and hands neurologically intact - normal pulses, normal sensation, normal rom. phalens test wnl cranial nerves intact grossly    Assessment:     1. Abdominal pain 2 - neuropathy, transient with unknown etiology    Plan:     Galadriel was seen today for establish care.  Diagnoses and associated orders for this visit:  Numbness of fingers of both hands - Ambulatory referral to Neurology - Vitamin B12  abd pain  - suspect viral gastroenturits. Provided education and reassurance. Advised bland diet and pushing fluids. If wornses or not improving, rtc. If acutely worse go to ED.

## 2014-01-20 NOTE — Progress Notes (Signed)
Patient is here to establish care. Complains of chills and pain in Lt upper abdomen x1 day. Also complains of bilateral numbness in hands starting today. Light pain in upper back x2 weeks. Taking ibuprofen for pain. Pain scale of 5 in abdomen; more discomfort. Back pain of a 5 today. Chills increased to sweating and nausea, no vomiting. Patient has an interpreter.

## 2014-01-20 NOTE — Patient Instructions (Signed)
Dolor abdominal en adultos  (Abdominal Pain, Adult)  El dolor puede tener muchas causas. Normalmente la causa del dolor abdominal no es una enfermedad y mejorará sin tratamiento. Frecuentemente puede controlarse y tratarse en casa. Su médico le realizará un examen físico y posiblemente solicite análisis de sangre y radiografías para ayudar a determinar la gravedad de su dolor. Sin embargo, en muchos casos, debe transcurrir más tiempo antes de que se pueda encontrar una causa evidente del dolor. Antes de llegar a ese punto, es posible que su médico no sepa si necesita más pruebas o un tratamiento más profundo.  INSTRUCCIONES PARA EL CUIDADO EN EL HOGAR   Esté atento al dolor para ver si hay cambios. Las siguientes indicaciones ayudarán a aliviar cualquier molestia que pueda sentir:  · Tome solo medicamentos de venta libre o recetados, según las indicaciones del médico.  · No tome laxantes a menos que se lo haya indicado su médico.  · Pruebe con una dieta líquida absoluta (caldo, té o agua) según se lo indique su médico. Introduzca gradualmente una dieta normal, según su tolerancia.  SOLICITE ATENCIÓN MÉDICA SI:  · Tiene dolor abdominal sin explicación.  · Tiene dolor abdominal relacionado con náuseas o diarrea.  · Tiene dolor cuando orina o defeca.  · Experimenta dolor abdominal que lo despierta de noche.  · Tiene dolor abdominal que empeora o mejora cuando come alimentos.  · Tiene dolor abdominal que empeora cuando come alimentos grasosos.  SOLICITE ATENCIÓN MÉDICA DE INMEDIATO SI:   · El dolor no desaparece en un plazo máximo de 2 horas.  · Tiene fiebre.  · No deja de (vomitar).  · El dolor se siente solo en partes del abdomen, como el lado derecho o la parte inferior izquierda del abdomen.  · Evacúa materia fecal sanguinolenta o negra, de aspecto alquitranado.  ASEGÚRESE DE QUE:  · Comprende estas instrucciones.  · Controlará su afección.  · Recibirá ayuda de inmediato si no mejora o si empeora.  Document  Released: 11/04/2005 Document Revised: 08/25/2013  ExitCare® Patient Information ©2014 [employer].

## 2014-01-21 LAB — VITAMIN B12: Vitamin B-12: 764 pg/mL (ref 211–911)

## 2014-01-24 ENCOUNTER — Telehealth: Payer: Self-pay | Admitting: Emergency Medicine

## 2014-01-24 NOTE — Progress Notes (Signed)
Pt given normal lab results via pacific interpretor

## 2014-02-03 ENCOUNTER — Ambulatory Visit: Payer: No Typology Code available for payment source | Attending: Internal Medicine | Admitting: Internal Medicine

## 2014-02-03 VITALS — BP 106/67 | HR 83 | Temp 99.1°F | Resp 16 | Ht 63.0 in | Wt 125.0 lb

## 2014-02-03 DIAGNOSIS — R300 Dysuria: Secondary | ICD-10-CM

## 2014-02-03 LAB — POCT URINALYSIS DIPSTICK
Glucose, UA: NEGATIVE
Ketones, UA: NEGATIVE
Leukocytes, UA: NEGATIVE
Nitrite, UA: NEGATIVE
Protein, UA: 30
Spec Grav, UA: >=1.03
Urobilinogen, UA: 0.2
pH, UA: 6

## 2014-02-03 NOTE — Progress Notes (Unsigned)
Patient ID: Alicia Miller, female   DOB: 06/26/1976, 38 y.o.   MRN: [id number]   HPI: Alicia Miller is a 38 y.o. female presenting on 02/03/2014 with     Past Medical History  Diagnosis Date  . No pertinent past medical history   . Depression     Past Surgical History  Procedure Laterality Date  . No past surgeries      Current Outpatient Prescriptions  Medication Sig Dispense Refill  . cyclobenzaprine (FLEXERIL) 10 MG tablet Take 1 tablet (10 mg total) by mouth at bedtime.  14 tablet  0   No current facility-administered medications for this visit.    No Known Allergies  Family History  Problem Relation Age of Onset  . Diabetes Father   . Cancer Sister   . Diabetes Paternal Grandmother     History   Social History  . Marital Status: Married    Spouse Name: N/A    Number of Children: N/A  . Years of Education: N/A   Occupational History  . Not on file.   Social History Main Topics  . Smoking status: Never Smoker   . Smokeless tobacco: Never Used  . Alcohol Use: No  . Drug Use: No  . Sexual Activity: Yes    Birth Control/ Protection: Condom   Other Topics Concern  . Not on file   Social History Narrative  . No narrative on file    Review of Systems  Review of Systems  Constitutional: Negative for fever, chills, diaphoresis, activity change, appetite change and fatigue.  HENT: Negative for ear pain, nosebleeds, congestion, facial swelling, rhinorrhea, neck pain, neck stiffness and ear discharge.  Eyes: Negative for pain, discharge, redness, itching and visual disturbance.  Respiratory: Negative for cough, choking, chest tightness, shortness of breath, wheezing and stridor.  Cardiovascular: Negative for chest pain, palpitations and leg swelling.  Gastrointestinal: Negative for abdominal distention, vomiting, diarrhea or consitpation Genitourinary: Negative for dysuria, urgency, frequency, hematuria, flank pain, decreased urine volume,  difficulty urinating and dyspareunia.  Musculoskeletal: Negative for back pain, joint swelling, arthralgias or gait problem.  Neurological: Negative for dizziness, tremors, seizures, syncope, facial asymmetry, speech difficulty, weakness, light-headedness, numbness and headaches.  Hematological: Negative for adenopathy. Does not bruise/bleed easily.  Psychiatric/Behavioral: Negative for hallucinations, behavioral problems, confusion, dysphoric mood   Objective:  BP 106/67  Pulse 83  Temp(Src) 99.1 F (37.3 C) (Oral)  Resp 16  Ht 5\' 3"  (1.6 m)  Wt 125 lb (56.7 kg)  BMI 22.15 kg/m2  SpO2 100%  LMP 01/11/2014 Filed Weights   02/03/14 1153  Weight: 125 lb (56.7 kg)     Physical Exam  Constitutional: Appears well-developed and well-nourished. No distress. HENT: Normocephalic. External right and left ear normal. Oropharynx is clear and moist.  Eyes: Conjunctivae and EOM are normal. PERRLA, no scleral icterus.  Neck: Normal ROM. Neck supple. No JVD. No tracheal deviation. No thyromegaly.  CVS: RRR, S1/S2 +, no murmurs, no gallops, no carotid bruit.  Pulmonary: Effort and breath sounds normal, no stridor, rhonchi, wheezes, rales.  Abdominal: Soft. BS +,  no distension, tenderness, rebound or guarding.  Musculoskeletal: Normal range of motion. No edema and no tenderness.  Neuro: Alert. Normal reflexes, muscle tone coordination. No cranial nerve deficit. Skin: Skin is warm and dry. No rash noted. Not diaphoretic. No erythema. No pallor.  Psychiatric: Normal mood and affect. Behavior, judgment, thought content normal.   Lab Results  Component Value Date   WBC 8.8 01/08/2013   HGB  14.8 01/08/2013   HCT 46.4 01/08/2013   MCV 89.2 01/08/2013   PLT 198 02/03/2012   Lab Results  Component Value Date   CREATININE 0.66 11/30/2012   BUN 17 11/30/2012   NA 138 11/30/2012   K 4.4 11/30/2012   CL 103 11/30/2012   CO2 24 11/30/2012    No results found for this basename: HGBA1C   Lipid Panel   No results found for this basename: chol, trig, hdl, cholhdl, vldl, ldlcalc        There are no active problems to display for this patient.    Preventative Medicine:  Health Maintenance  Topic Date Due  . Pap Smear  09/08/1995  . Influenza Vaccine  06/18/2013  . Tetanus/tdap  02/03/2022    Adult vaccines due  Topic Date Due  . Tetanus/tdap  02/03/2022      Assessment and plan: No diagnosis found.   No Follow-up on file.   The patient was given clear instructions to go to ER or return to medical center if symptoms don't improve, worsen or new problems develop. The patient verbalized understanding. The patient was told to call to get lab results if they haven't heard anything in the next week.     Debbe Odea, MD

## 2014-02-03 NOTE — Progress Notes (Unsigned)
Pt is following up on her nausea, dizzy, chills and feeling tired. Pt reports that her hands are numb. Pt states that she feels a burning sensation when she urinates.  Pt has an interpretor.

## 2014-02-04 ENCOUNTER — Ambulatory Visit: Payer: Self-pay | Admitting: Physician Assistant

## 2014-02-04 VITALS — BP 105/65 | HR 83 | Temp 97.5°F | Resp 16 | Ht 63.0 in | Wt 125.0 lb

## 2014-02-04 DIAGNOSIS — R1030 Lower abdominal pain, unspecified: Secondary | ICD-10-CM

## 2014-02-04 DIAGNOSIS — F419 Anxiety disorder, unspecified: Secondary | ICD-10-CM

## 2014-02-04 DIAGNOSIS — M545 Low back pain, unspecified: Secondary | ICD-10-CM

## 2014-02-04 DIAGNOSIS — R110 Nausea: Secondary | ICD-10-CM

## 2014-02-04 DIAGNOSIS — N760 Acute vaginitis: Secondary | ICD-10-CM

## 2014-02-04 DIAGNOSIS — R42 Dizziness and giddiness: Secondary | ICD-10-CM

## 2014-02-04 DIAGNOSIS — F411 Generalized anxiety disorder: Secondary | ICD-10-CM

## 2014-02-04 DIAGNOSIS — R079 Chest pain, unspecified: Secondary | ICD-10-CM

## 2014-02-04 DIAGNOSIS — R109 Unspecified abdominal pain: Secondary | ICD-10-CM

## 2014-02-04 DIAGNOSIS — B9689 Other specified bacterial agents as the cause of diseases classified elsewhere: Secondary | ICD-10-CM

## 2014-02-04 DIAGNOSIS — R300 Dysuria: Secondary | ICD-10-CM

## 2014-02-04 LAB — POCT CBC
Granulocyte percent: 63.7 %G (ref 37–80)
HCT, POC: 44.3 % (ref 37.7–47.9)
Hemoglobin: 14.5 g/dL (ref 12.2–16.2)
Lymph, poc: 1.4 (ref 0.6–3.4)
MCH, POC: 28.9 pg (ref 27–31.2)
MCHC: 32.7 g/dL (ref 31.8–35.4)
MCV: 88.2 fL (ref 80–97)
MID (cbc): 0.6 (ref 0–0.9)
MPV: 9 fL (ref 0–99.8)
POC Granulocyte: 3.5 (ref 2–6.9)
POC LYMPH PERCENT: 26 %L (ref 10–50)
POC MID %: 10.3 %M (ref 0–12)
Platelet Count, POC: 320 10*3/uL (ref 142–424)
RBC: 5.02 M/uL (ref 4.04–5.48)
RDW, POC: 13.3 %
WBC: 5.5 10*3/uL (ref 4.6–10.2)

## 2014-02-04 LAB — POCT UA - MICROSCOPIC ONLY
Casts, Ur, LPF, POC: NEGATIVE
Crystals, Ur, HPF, POC: NEGATIVE
Epithelial cells, urine per micros: NEGATIVE
Mucus, UA: NEGATIVE
WBC, Ur, HPF, POC: NEGATIVE
Yeast, UA: NEGATIVE

## 2014-02-04 LAB — POCT URINALYSIS DIPSTICK
Bilirubin, UA: NEGATIVE
Glucose, UA: NEGATIVE
Ketones, UA: 40
Leukocytes, UA: NEGATIVE
Nitrite, UA: NEGATIVE
Protein, UA: NEGATIVE
Spec Grav, UA: 1.025
Urobilinogen, UA: 0.2
pH, UA: 5

## 2014-02-04 LAB — POCT WET PREP WITH KOH
Clue Cells Wet Prep HPF POC: 50
KOH Prep POC: NEGATIVE
RBC Wet Prep HPF POC: NEGATIVE
Trichomonas, UA: NEGATIVE
Yeast Wet Prep HPF POC: NEGATIVE

## 2014-02-04 LAB — POCT URINE PREGNANCY: Preg Test, Ur: NEGATIVE

## 2014-02-04 MED ORDER — NAPROXEN 500 MG PO TABS: 500.0000 mg | ORAL_TABLET | Freq: Two times a day (BID) | ORAL | Status: DC | PRN

## 2014-02-04 MED ORDER — HYDROXYZINE HCL 25 MG PO TABS: 12.5000 mg | ORAL_TABLET | Freq: Three times a day (TID) | ORAL | Status: DC | PRN

## 2014-02-04 MED ORDER — METRONIDAZOLE 500 MG PO TABS: 500.0000 mg | ORAL_TABLET | Freq: Two times a day (BID) | ORAL | Status: DC

## 2014-02-04 NOTE — Patient Instructions (Addendum)
Begin taking the metronidazole as directed.  Be sure to finish the full course.    Use the naproxen twice daily with food if needed for back or arm pain  Take the hydroxyzine every 8 hours if needed for panic symptoms - if this medication is not working well, please come back in so we can discuss other options  I will let you know when your thyroid test is back and if we need to do anything for this   Vaginosis bacteriana (Bacterial Vaginosis) La vaginosis bacteriana es una infeccin vaginal que perturba el equilibrio normal de las bacterias que se encuentran en la vagina. Es el resultado de un crecimiento excesivo de ciertas bacterias. Esta es la infeccin vaginal ms frecuente en mujeres en edad reproductiva. El tratamiento es importante para prevenir complicaciones, especialmente en mujeres embarazadas, dado que puede causar un parto prematuro. CAUSAS  La vaginosis bacteriana se origina por un aumento de bacterias nocivas que, generalmente, estn presentes en cantidades ms pequeas en la vagina. Varios tipos diferentes de bacterias pueden causar esta afeccin. Sin embargo, la causa de su desarrollo no se comprende totalmente. [place] o comportamientos pueden exponerlo a un mayor riesgo de desarrollar vaginosis bacteriana, entre los que se incluyen:  Tener una nueva pareja sexual o mltiples parejas sexuales.  Las duchas vaginales  El uso del DIU (dispositivo intrauterino) como mtodo anticonceptivo. El contagio no se produce en baos, por ropas de cama, en piscinas o por contacto con objetos. SIGNOS Y SNTOMAS  Algunas mujeres que padecen vaginosis bacteriana no presentan signos ni sntomas. Los sntomas ms comunes son:  Secrecin vaginal de color grisceo.  Secrecin vaginal con olor similar al [employer], especialmente despus de Retail banker.  Picazn o sensacin de ardor en la vagina o la vulva.  Ardor o dolor al  [employer]. DIAGNSTICO  Su mdico analizar su historia clnica y le examinar la vagina para detectar signos de vaginosis bacteriana. Puede tomarle Truddie Coco de flujo vaginal. Su mdico examinar esta muestra con un microscopio para controlar las bacterias y clulas anormales. Tambin puede realizarse un anlisis del pH vaginal.  TRATAMIENTO  La vaginosis bacteriana puede tratarse con antibiticos, en forma de comprimidos o de crema vaginal. Puede indicarse una segunda tanda de antibiticos si la afeccin se repite despus del tratamiento.  [place] solo medicamentos de venta libre o recetados, segn las indicaciones del mdico.  Si le han recetado antibiticos, tmelos como se le indic. Asegrese de que finaliza la prescripcin completa aunque se sienta mejor.  No mantenga relaciones sexuales Animator.  Comunique a sus compaeros sexuales que sufre una infeccin vaginal. Deben consultar a su mdico y recibir tratamiento si tienen problemas, como picazn o una erupcin cutnea leve.  Practique el sexo seguro usando preservativos y tenga un nico compaero sexual. SOLICITE ATENCIN MDICA SI:   Sus sntomas no mejoran despus de 3 das de [employer].  Aumenta la secrecin o Conservation officer, historic buildings.  Tiene fiebre. ASEGRESE DE QUE:   Comprende estas instrucciones.  Controlar su afeccin.  Recibir ayuda de inmediato si no mejora o si empeora. PARA OBTENER MS INFORMACIN  Centros para el control y la prevencin de Probation officer for Disease Control and Prevention, CDC): AppraiserFraud.fi Asociacin Estadounidense de la Salud Sexual (American Sexual Health Association, SHA): www.ashastd.org  Document Released: 02/11/2008 Document Revised: 08/25/2013 [facility] Patient Information 2014 [place], [place].   Crisis de angustia ( Panic Attacks) Las crisis  de [place] son ataques repentinos y [place] de [place], miedo o Tree surgeon  extremos. Es posible que ocurran sin motivo, cuando est relajado, ansioso o cuando duerme. Las crisis de [place] pueden ocurrir por algunas de estas razones:   En ocasiones, las personas sanas presentan crisis de [place] en situaciones extremas, potencialmente mortales, como la guerra o los desastres naturales. La ansiedad normal es un mecanismo de defensa del cuerpo que nos ayuda a Firefighter ante situaciones de peligro ( respuesta de defensa o huida).  Con frecuencia, las crisis de [place] aparecen acompaadas de trastornos de ansiedad, como trastorno de pnico, trastorno de ansiedad social, trastorno de ansiedad generalizada y fobias. Los trastornos de ansiedad provocan ansiedad excesiva o incontrolable. Pueden interferir en sus vnculos u otras actividades de la vida.  En ocasiones, las crisis de ansiedad se presentan con otras enfermedades mentales, como la depresin y el trastorno por estrs postraumtico.  Algunas enfermedades, medicamentos recetados y drogas pueden provocar crisis de [place]. SNTOMAS  Las crisis de [place] comienzan repentinamente, Artist punto mximo a los 20 minutos y se presentan junto con cuatro o ms de los siguientes sntomas:  Latidos cardacos acelerados o frecuencia cardaca elevada (palpitaciones).  Sudoracin.  Temblores o sacudidas.  Dificultad para respirar o sensacin de asfixia.  Sensacin de [employer].  Dolor o Adult nurse.  Nuseas o sensacin extraa en el estmago.  Mareos o sensacin de desvanecimiento o desmayo.  Escalofros o sofocos.  Hormigueos o adormecimiento en [place] y los pies.  Sensacin de [employer] no son reales o de que no es usted.  Temor a perder el control o el juicio.  Temor a Pharmacologist. Algunos de estos sntomas pueden parecerse a enfermedades graves. Por ejemplo, es posible que piense que tendr un ataque cardaco. Aunque las crisis de [place] pueden ser muy atemorizantes, no  representan un peligro de vida. DIAGNSTICO  Las crisis de [place] se diagnostican con una evaluacin que realiza el mdico. Su mdico le realizar preguntas sobre los sntomas, como cundo y dnde ocurrieron. Tambin le preguntar sobre su historia clnica y consumo de alcohol y drogas, incluidos los medicamentos recetados. Es posible que su mdico le indique anlisis de sangre u otros estudios para Teacher, English as a foreign language graves. El mdico podr derivarlo a un profesional de la salud mental para que le realice una evaluacin ms profunda. TRATAMIENTO   En general, las personas sanas que registran una o dos crisis de [place] bajo una situacin extrema de peligro de vida, no requerirn Clinical research associate.  El [place] de las crisis de [place] asociadas con trastornos de ansiedad u otras enfermedades mentales, generalmente, requiere orientacin por parte de un profesional de la salud mental o un mdico, o bien la combinacin de [place]. Su mdico le ayudar a Leisure centre manager tratamiento para usted.  Las crisis de [place] asociadas a enfermedades fsicas, generalmente, desaparecen con el tratamiento de la enfermedad. Si un medicamento recetado le causa crisis de [place], consulte a su mdico si debe suspenderlo, disminuir la dosis o sustituirlo por otro medicamento.  Las crisis de [place] asociadas al consumo de drogas o alcohol desaparecen con la abstinencia. Algunos adultos necesitan ayuda profesional con el fin de dejar de beber o usar drogas. [place] todos los medicamentos segn las indicaciones.  Consulte con su mdico antes de usar medicamentos recetados o de venta libre por primera vez.  [place] con todas las visitas de control, segn le indique su  mdico. SOLICITE ATENCIN MDICA SI:  No puede tomar los [employer] se lo han indicado.  Los sntomas no mejoran o empeoran. SOLICITE ATENCIN MDICA DE INMEDIATO SI:   Experimenta sntomas  de crisis de [place] diferentes de los que presenta habitualmente.  Tiene pensamientos serios acerca de lastimarse a usted mismo o daar a Producer, television/film/video.  Toma medicamentos para las crisis de [place] y presenta efectos secundarios graves. ASEGRESE DE QUE:  Comprende estas instrucciones.  Controlar su afeccin.  Recibir ayuda de inmediato si no mejora o si empeora. Document Released: 11/04/2005 Document Revised: 08/25/2013 [facility] Patient Information 2014 [place], [place].

## 2014-02-04 NOTE — Progress Notes (Signed)
Subjective:    Patient ID: Alicia Miller, female    DOB: [birth year], 38 y.o.   MRN: [id number]  HPI   Alicia Miller is a pleasant 38 yr old female here with multiple concerns:  First, she complains of 3 days of dysuria.  She denies frequency, urgency, or hematuria.  She was seen for this yesterday.  Urine dip at that time was negative.  She does endorse some low belly pain and low back pain.  She denies hx renal stones but states she has had kidney infections before.  She is having regular bowel movements though did have 1 episode of diarrhea this morning.  She denies vaginal discharge, itching, irritation.  She is sexually active.  Condoms only.  No concern for STI and declines testing today.  LMP 01/11/14, regular every month.  She is not having fever or chills  She complains of nausea but no vomiting - this has been present for 3 days.  Appetite is down.  She is eating and drinking normally though.  No sick contacts with GI symptoms  She reports pain in her arm x 3 weeks.  She reports episodes of dizziness that are happening with increasing frequency.  She endorses that this is a sensation like she is going to pass out, not room spinning dizziness.  She feels hot and sweaty when this happens.  Associated nausea.  She states there is pressure in her chest when this happens.  She does not currently have chest pressure or pain.  She is not short of breath  She complains of numbness/tingling in her hands.  Additionally has perioral numbness    Review of Systems  Constitutional: Positive for fatigue. Negative for fever and chills.  HENT: Negative.   Respiratory: Positive for shortness of breath. Negative for cough.   Cardiovascular: Positive for chest pain (pressure).  Gastrointestinal: Positive for nausea, abdominal pain (lower) and diarrhea. Negative for vomiting.  Genitourinary: Positive for dysuria. Negative for urgency, frequency, hematuria, flank pain, vaginal bleeding,  vaginal discharge and menstrual problem.  Musculoskeletal: Positive for arthralgias and back pain.  Skin: Negative.   Neurological: Positive for light-headedness and numbness (hands, face). Negative for syncope and weakness.       Objective:   Physical Exam  Vitals reviewed. Constitutional: She is oriented to person, place, and time. She appears well-developed and well-nourished. No distress.  HENT:  Head: Normocephalic and atraumatic.  Eyes: Conjunctivae are normal. No scleral icterus.  Neck: Neck supple.  Cardiovascular: Normal rate, regular rhythm, normal heart sounds and intact distal pulses.   Pulmonary/Chest: Effort normal and breath sounds normal. She has no wheezes. She has no rales.  Abdominal: Soft. Bowel sounds are normal. There is tenderness in the right lower quadrant, suprapubic area and left lower quadrant. There is no rebound, no CVA tenderness and no tenderness at McBurney's point.  Musculoskeletal: Normal range of motion.  Lymphadenopathy:    She has no cervical adenopathy.  Neurological: She is alert and oriented to person, place, and time.  Skin: Skin is warm and dry.  Psychiatric: She has a normal mood and affect. Her behavior is normal.    EKG discussed with Dr. Lorelei Pont - NSR  Results for orders placed in visit on 02/04/14  TSH      Result Value Ref Range   TSH 1.509  0.350 - 4.500 uIU/mL  POCT URINALYSIS DIPSTICK      Result Value Ref Range   Color, UA yellow     Clarity, UA  clear     Glucose, UA neg     Bilirubin, UA neg     Ketones, UA 40     Spec Grav, UA 1.025     Blood, UA large     pH, UA 5.0     Protein, UA neg     Urobilinogen, UA 0.2     Nitrite, UA neg     Leukocytes, UA Negative    POCT UA - MICROSCOPIC ONLY      Result Value Ref Range   WBC, Ur, HPF, POC neg     RBC, urine, microscopic 0-2     Bacteria, U Microscopic trace     Mucus, UA neg     Epithelial cells, urine per micros neg     Crystals, Ur, HPF, POC neg     Casts, Ur,  LPF, POC neg     Yeast, UA neg    POCT URINE PREGNANCY      Result Value Ref Range   Preg Test, Ur Negative    POCT CBC      Result Value Ref Range   WBC 5.5  4.6 - 10.2 K/uL   Lymph, poc 1.4  0.6 - 3.4   POC LYMPH PERCENT 26.0  10 - 50 %L   MID (cbc) 0.6  0 - 0.9   POC MID % 10.3  0 - 12 %M   POC Granulocyte 3.5  2 - 6.9   Granulocyte percent 63.7  37 - 80 %G   RBC 5.02  4.04 - 5.48 M/uL   Hemoglobin 14.5  12.2 - 16.2 g/dL   HCT, POC 44.3  37.7 - 47.9 %   MCV 88.2  80 - 97 fL   MCH, POC 28.9  27 - 31.2 pg   MCHC 32.7  31.8 - 35.4 g/dL   RDW, POC 13.3     Platelet Count, POC 320  142 - 424 K/uL   MPV 9.0  0 - 99.8 fL  POCT WET PREP WITH KOH      Result Value Ref Range   Trichomonas, UA Negative     Clue Cells Wet Prep HPF POC 50%     Epithelial Wet Prep HPF POC 5-7     Yeast Wet Prep HPF POC neg     Bacteria Wet Prep HPF POC 2+     RBC Wet Prep HPF POC neg     WBC Wet Prep HPF POC 1-3     KOH Prep POC Negative          Assessment & Plan:  Bacterial vaginosis - Plan: metroNIDAZOLE (FLAGYL) 500 MG tablet  Dysuria - Plan: POCT urinalysis dipstick, POCT UA - Microscopic Only, POCT urine pregnancy, POCT CBC, POCT Wet Prep with KOH  Dizziness - Plan: TSH  Nausea alone - Plan: TSH  Abdominal pain, lower - Plan: TSH  Chest pain - Plan: EKG 12-Lead  Anxiety - Plan: hydrOXYzine (ATARAX/VISTARIL) 25 MG tablet  Low back pain - Plan: naproxen (NAPROSYN) 500 MG tablet   Alicia Miller is a very pleasant 38 yr old female here with multiple complaints.  She does have bacterial vaginosis today, which I think is causing her dysuria and may be contributing to her lower abdominal pain and lower back pain.  Urine is clear today, and was clear yesterday.  CBC is normal.  Will treat with Flagyl for BV.  She is having episodes of nausea, dizziness, chest pressure.  EKG is normal.  She is also  having numbness in bilateral hands and periorally.  I think all of these symptoms are  attributable to anxiety/panic.  I discussed this with pt and discussed that work up is reassuring.  Will try prn Atarax for now.  Discussed with pt that if she continues to have panic/anxiety symptoms, there are other medications we can try.  She will RTC if this is the case.  Pt to call or RTC if worsening or not improving  E. Natividad Brood MHS, PA-C [facility] 3/21/20153:10 PM  '

## 2014-02-05 LAB — TSH: TSH: 1.509 u[IU]/mL (ref 0.350–4.500)

## 2014-04-07 ENCOUNTER — Ambulatory Visit: Payer: No Typology Code available for payment source | Attending: Internal Medicine | Admitting: *Deleted

## 2014-04-07 VITALS — BP 113/74 | HR 79 | Temp 99.3°F | Resp 16

## 2014-04-07 DIAGNOSIS — N390 Urinary tract infection, site not specified: Secondary | ICD-10-CM

## 2014-04-07 DIAGNOSIS — R110 Nausea: Secondary | ICD-10-CM

## 2014-04-07 DIAGNOSIS — M549 Dorsalgia, unspecified: Secondary | ICD-10-CM

## 2014-04-07 LAB — POCT URINALYSIS DIPSTICK
Bilirubin, UA: NEGATIVE
Glucose, UA: NEGATIVE
Ketones, UA: NEGATIVE
Leukocytes, UA: NEGATIVE
Nitrite, UA: NEGATIVE
Protein, UA: NEGATIVE
Spec Grav, UA: 1.01
Urobilinogen, UA: 0.2
pH, UA: 7

## 2014-04-07 LAB — POCT URINE PREGNANCY: Preg Test, Ur: NEGATIVE

## 2014-04-07 MED ORDER — IBUPROFEN 600 MG PO TABS: 600.0000 mg | ORAL_TABLET | Freq: Three times a day (TID) | ORAL | Status: DC | PRN

## 2014-04-07 MED ORDER — ONDANSETRON HCL 4 MG PO TABS: 4.0000 mg | ORAL_TABLET | Freq: Three times a day (TID) | ORAL | Status: DC | PRN

## 2014-04-07 NOTE — Patient Instructions (Signed)
If you are not feeling better by Tuesday return to the clinic.

## 2014-04-07 NOTE — Progress Notes (Unsigned)
Patient here today stating she is having some stomach pain that radiates to the back with nausea. LMP 03/18/2014. UA and pregnancy test are negative. Patient informed of negative results via interpreter services.

## 2014-04-12 ENCOUNTER — Ambulatory Visit (HOSPITAL_COMMUNITY)
Admission: RE | Admit: 2014-04-12 | Discharge: 2014-04-12 | Disposition: A | Discharge status: Home / Self Care | Payer: No Typology Code available for payment source | Source: Ambulatory Visit | Attending: Internal Medicine | Admitting: Internal Medicine

## 2014-04-12 ENCOUNTER — Ambulatory Visit: Payer: No Typology Code available for payment source | Attending: Internal Medicine | Admitting: *Deleted

## 2014-04-12 VITALS — BP 105/69 | HR 71 | Temp 98.3°F | Resp 16 | Wt 128.4 lb

## 2014-04-12 DIAGNOSIS — R1013 Epigastric pain: Secondary | ICD-10-CM | POA: Insufficient documentation

## 2014-04-12 DIAGNOSIS — R109 Unspecified abdominal pain: Secondary | ICD-10-CM

## 2014-04-12 NOTE — Patient Instructions (Signed)
Continue to take your Ibuprofen for pain. Go to [facility] for X-ray of your abdomen. We will call you with the results and keep your appointment for 04/22/2014.

## 2014-04-12 NOTE — Progress Notes (Unsigned)
Patient came in today complaining of stomach and back pain. Patient was here on Apr 07, 2014 prescribed Zofran and Ibuprofen. Patient states nausea is gone but still has stomach and back pain. Patient ordered a an x-ray of abdomen. Patient informed to go to hospital for xray and keep follow up appointment for 04/22/2014. Patient verbalized understanding via telephone interpreter.Alverda Skeans, RN

## 2014-04-18 DIAGNOSIS — K289 Gastrojejunal ulcer, unspecified as acute or chronic, without hemorrhage or perforation: Secondary | ICD-10-CM

## 2014-04-18 DIAGNOSIS — A048 Other specified bacterial intestinal infections: Secondary | ICD-10-CM | POA: Insufficient documentation

## 2014-04-18 DIAGNOSIS — B9681 Helicobacter pylori [H. pylori] as the cause of diseases classified elsewhere: Secondary | ICD-10-CM | POA: Insufficient documentation

## 2014-04-22 ENCOUNTER — Ambulatory Visit: Payer: No Typology Code available for payment source | Attending: Internal Medicine | Admitting: Internal Medicine

## 2014-04-22 ENCOUNTER — Encounter: Payer: Self-pay | Admitting: Internal Medicine

## 2014-04-22 VITALS — BP 108/73 | HR 68 | Temp 98.0°F | Resp 17 | Wt 128.6 lb

## 2014-04-22 DIAGNOSIS — M5380 Other specified dorsopathies, site unspecified: Secondary | ICD-10-CM | POA: Insufficient documentation

## 2014-04-22 DIAGNOSIS — L909 Atrophic disorder of skin, unspecified: Secondary | ICD-10-CM | POA: Insufficient documentation

## 2014-04-22 DIAGNOSIS — L919 Hypertrophic disorder of the skin, unspecified: Secondary | ICD-10-CM

## 2014-04-22 DIAGNOSIS — M549 Dorsalgia, unspecified: Secondary | ICD-10-CM

## 2014-04-22 DIAGNOSIS — L918 Other hypertrophic disorders of the skin: Secondary | ICD-10-CM | POA: Insufficient documentation

## 2014-04-22 DIAGNOSIS — R109 Unspecified abdominal pain: Secondary | ICD-10-CM

## 2014-04-22 DIAGNOSIS — M62830 Muscle spasm of back: Secondary | ICD-10-CM

## 2014-04-22 MED ORDER — GABAPENTIN 300 MG PO CAPS: 300.0000 mg | ORAL_CAPSULE | Freq: Every day | ORAL | Status: DC

## 2014-04-22 NOTE — Progress Notes (Signed)
MRN: [id number] Name: Alicia Miller  Sex: female Age: 38 y.o. DOB: 1976/06/13  Allergies: Review of patient's allergies indicates no known allergies.  Chief Complaint  Patient presents with  . Back Pain    HPI: Patient is 38 y.o. female who complains of chronic abdominal pain and also reported to have nausea but she took Zofran which help her with the symptoms she had abdominal x-ray done recently which was negative, she also reported to have lower back pain, she denies any urinary symptoms, she also reported to have a skin tag on her neck and is requesting to see a dermatologist. Past Medical History  Diagnosis Date  . No pertinent past medical history   . Depression     Past Surgical History  Procedure Laterality Date  . No past surgeries        Medication List       This list is accurate as of: 04/22/14 11:18 AM.  Always use your most recent med list.               gabapentin 300 MG capsule  Commonly known as:  NEURONTIN  Take 1 capsule (300 mg total) by mouth at bedtime.     hydrOXYzine 25 MG tablet  Commonly known as:  ATARAX/VISTARIL  Take 0.5-1 tablets (12.5-25 mg total) by mouth every 8 (eight) hours as needed for anxiety.     ibuprofen 600 MG tablet  Commonly known as:  ADVIL,MOTRIN  Take 1 tablet (600 mg total) by mouth every 8 (eight) hours as needed (Cramping and Back Pain).     metroNIDAZOLE 500 MG tablet  Commonly known as:  FLAGYL  Take 1 tablet (500 mg total) by mouth 2 (two) times daily with a meal. DO NOT CONSUME ALCOHOL WHILE TAKING THIS MEDICATION.     naproxen 500 MG tablet  Commonly known as:  NAPROSYN  Take 1 tablet (500 mg total) by mouth 2 (two) times daily as needed.     ondansetron 4 MG tablet  Commonly known as:  ZOFRAN  Take 1 tablet (4 mg total) by mouth every 8 (eight) hours as needed for nausea or vomiting.        Meds ordered this encounter  Medications  . gabapentin (NEURONTIN) 300 MG capsule    Sig: Take 1  capsule (300 mg total) by mouth at bedtime.    Dispense:  90 capsule    Refill:  0    Immunization History  Administered Date(s) Administered  . Tdap 02/04/2012    Family History  Problem Relation Age of Onset  . Diabetes Father   . Cancer Sister   . Diabetes Paternal Grandmother     History  Substance Use Topics  . Smoking status: Never Smoker   . Smokeless tobacco: Never Used  . Alcohol Use: No    Review of Systems   As noted in HPI  Filed Vitals:   04/22/14 1044  BP: 108/73  Pulse: 68  Temp: 98 F (36.7 C)  Resp: 17    Physical Exam  Physical Exam  Constitutional: No distress.  Eyes: EOM are normal. Pupils are equal, round, and reactive to light.  Cardiovascular: Normal rate and regular rhythm.   Pulmonary/Chest: Breath sounds normal. No respiratory distress. She has no wheezes. She has no rales.  Abdominal:  Minimal epigastric and RLQ tenderness     CBC    Component Value Date/Time   WBC 5.5 02/04/2014 1754   WBC 12.5* 02/03/2012 5427  RBC 5.02 02/04/2014 1754   RBC 4.26 02/03/2012 0605   HGB 14.5 02/04/2014 1754   HGB 12.6 02/03/2012 0605   HCT 44.3 02/04/2014 1754   HCT 36.8 02/03/2012 0605   PLT 198 02/03/2012 0605   MCV 88.2 02/04/2014 1754   MCV 86.4 02/03/2012 0605    CMP     Component Value Date/Time   NA 138 11/30/2012 1548   K 4.4 11/30/2012 1548   CL 103 11/30/2012 1548   CO2 24 11/30/2012 1548   GLUCOSE 96 11/30/2012 1548   BUN 17 11/30/2012 1548   CREATININE 0.66 11/30/2012 1548   CALCIUM 9.7 11/30/2012 1548   PROT 7.6 11/30/2012 1548   ALBUMIN 4.6 11/30/2012 1548   AST 12 11/30/2012 1548   ALT 9 11/30/2012 1548   ALKPHOS 58 11/30/2012 1548   BILITOT 0.4 11/30/2012 1548    No results found for this basename: chol, tri, ldl    No components found with this basename: hga1c    Lab Results  Component Value Date/Time   AST 12 11/30/2012  3:48 PM    Assessment and Plan   Abdominal pain, unspecified site - Plan: I have ordered her US  Abdomen Complete for further evaluation  Back pain - Plan: gabapentin (NEURONTIN) 300 MG capsule  Back muscle spasm - Plan: Advised patient to apply heating pad gabapentin (NEURONTIN) 300 MG capsule  Skin tag - Plan: Ambulatory referral to Dermatology     Return in about 3 months (around 07/23/2014).  Lorayne Marek, MD

## 2014-04-22 NOTE — Progress Notes (Signed)
Patient complains of back pain the past two months States has some nausea and complains of hot flashes Currently has her monthly period Having some pain in her varicose veins

## 2014-04-27 ENCOUNTER — Telehealth: Payer: Self-pay

## 2014-04-27 ENCOUNTER — Ambulatory Visit (HOSPITAL_COMMUNITY)
Admission: RE | Admit: 2014-04-27 | Discharge: 2014-04-27 | Disposition: A | Discharge status: Home / Self Care | Payer: No Typology Code available for payment source | Source: Ambulatory Visit | Attending: Internal Medicine | Admitting: Internal Medicine

## 2014-04-27 DIAGNOSIS — K8020 Calculus of gallbladder without cholecystitis without obstruction: Secondary | ICD-10-CM

## 2014-04-27 DIAGNOSIS — R109 Unspecified abdominal pain: Secondary | ICD-10-CM | POA: Insufficient documentation

## 2014-04-27 DIAGNOSIS — G8929 Other chronic pain: Secondary | ICD-10-CM | POA: Insufficient documentation

## 2014-04-27 DIAGNOSIS — R110 Nausea: Secondary | ICD-10-CM | POA: Insufficient documentation

## 2014-04-27 NOTE — Telephone Encounter (Signed)
Interpreter line used Patient is aware of her [place] results Referral put in epic for general surgery

## 2014-04-27 NOTE — Telephone Encounter (Signed)
Message copied by Dorothe Pea on Wed Apr 27, 2014 10:40 AM ------      Message from: Lorayne Marek      Created: Wed Apr 27, 2014 10:32 AM       Call and let the patient know that her ultrasound reported multiple gallstones, since patient has been symptomatic with pain and nausea,  recommend surgical evaluation for possible cholecystectomy, please put in the referral. ------

## 2014-05-09 DIAGNOSIS — R109 Unspecified abdominal pain: Secondary | ICD-10-CM | POA: Insufficient documentation

## 2014-07-11 ENCOUNTER — Encounter: Payer: Self-pay | Admitting: Internal Medicine

## 2014-07-27 ENCOUNTER — Ambulatory Visit: Payer: Self-pay | Attending: Internal Medicine | Admitting: Internal Medicine

## 2014-07-27 ENCOUNTER — Encounter: Payer: Self-pay | Admitting: Internal Medicine

## 2014-07-27 VITALS — BP 102/67 | HR 66 | Temp 98.0°F | Ht 63.0 in | Wt 128.4 lb

## 2014-07-27 DIAGNOSIS — K029 Dental caries, unspecified: Secondary | ICD-10-CM | POA: Insufficient documentation

## 2014-07-27 DIAGNOSIS — R109 Unspecified abdominal pain: Secondary | ICD-10-CM

## 2014-07-27 DIAGNOSIS — K2990 Gastroduodenitis, unspecified, without bleeding: Principal | ICD-10-CM

## 2014-07-27 DIAGNOSIS — K2970 Gastritis, unspecified, without bleeding: Secondary | ICD-10-CM | POA: Insufficient documentation

## 2014-07-27 NOTE — Progress Notes (Signed)
MRN: [id number] Name: Alicia Miller  Sex: female Age: 38 y.o. DOB: 1976/01/10  Allergies: Review of patient's allergies indicates no known allergies.  Chief Complaint  Patient presents with  . Follow-up    abdominal pain    HPI: Patient is 38 y.o. female who comes today for followup, on the last visit patient had abdominal ultrasound done which reported gallstone as per patient she was evaluated by specialist and was recommended to get EGD done she had EGD done in July and reported to have gastritis, she's currently taking Prilosec and reports improvement in her symptoms, she already has scheduled follow up with her GI in [place]. Patient is complaining of dental cavities and is requesting referral to see a dentist.   Past Medical History  Diagnosis Date  . No pertinent past medical history   . Depression     Past Surgical History  Procedure Laterality Date  . No past surgeries        Medication List       This list is accurate as of: 07/27/14 11:15 AM.  Always use your most recent med list.               ibuprofen 600 MG tablet  Commonly known as:  ADVIL,MOTRIN  Take 1 tablet (600 mg total) by mouth every 8 (eight) hours as needed (Cramping and Back Pain).        No orders of the defined types were placed in this encounter.    Immunization History  Administered Date(s) Administered  . Tdap 02/04/2012    Family History  Problem Relation Age of Onset  . Diabetes Father   . Cancer Sister   . Diabetes Paternal Grandmother     History  Substance Use Topics  . Smoking status: Never Smoker   . Smokeless tobacco: Never Used  . Alcohol Use: No    Review of Systems   As noted in HPI  Filed Vitals:   07/27/14 1029  BP: 102/67  Pulse: 66  Temp: 98 F (36.7 C)    Physical Exam  Physical Exam  HENT:  Left upper molar cavitites  Eyes: EOM are normal. Pupils are equal, round, and reactive to light.  Cardiovascular: Normal rate and  regular rhythm.   Pulmonary/Chest: Breath sounds normal. No respiratory distress. She has no wheezes. She has no rales.  Abdominal: Soft. There is no tenderness. There is no rebound.    CBC    Component Value Date/Time   WBC 5.5 02/04/2014 1754   WBC 12.5* 02/03/2012 0605   RBC 5.02 02/04/2014 1754   RBC 4.26 02/03/2012 0605   HGB 14.5 02/04/2014 1754   HGB 12.6 02/03/2012 0605   HCT 44.3 02/04/2014 1754   HCT 36.8 02/03/2012 0605   PLT 198 02/03/2012 0605   MCV 88.2 02/04/2014 1754   MCV 86.4 02/03/2012 0605    CMP     Component Value Date/Time   NA 138 11/30/2012 1548   K 4.4 11/30/2012 1548   CL 103 11/30/2012 1548   CO2 24 11/30/2012 1548   GLUCOSE 96 11/30/2012 1548   BUN 17 11/30/2012 1548   CREATININE 0.66 11/30/2012 1548   CALCIUM 9.7 11/30/2012 1548   PROT 7.6 11/30/2012 1548   ALBUMIN 4.6 11/30/2012 1548   AST 12 11/30/2012 1548   ALT 9 11/30/2012 1548   ALKPHOS 58 11/30/2012 1548   BILITOT 0.4 11/30/2012 1548    No results found for this basename: chol, tri, ldl  No components found with this basename: hga1c    Lab Results  Component Value Date/Time   AST 12 11/30/2012  3:48 PM    Assessment and Plan  Abdominal pain, unspecified site/Gastritis  Status post EGD reported gastritis, currently patient is on PPI  and following up with her gastroenterologist.  Dental cavities - Plan: Ambulatory referral to Dentistry   Health Maintenance  -Pap Smear: following with GYN   Return in about 6 months (around 01/25/2015), or if symptoms worsen or fail to improve, for Gastritis.  Lorayne Marek, MD

## 2014-07-27 NOTE — Progress Notes (Signed)
Patient states she is her today for follow up abdominal pain. Pain is much better and only with hurt slightly when she applies pressure to the area. Not taking any prescribed pain medications and only takes ibuprofen when needed.

## 2014-08-26 ENCOUNTER — Ambulatory Visit: Payer: Self-pay

## 2014-09-02 ENCOUNTER — Ambulatory Visit: Payer: Self-pay | Attending: Internal Medicine

## 2014-09-16 ENCOUNTER — Other Ambulatory Visit (HOSPITAL_COMMUNITY): Payer: Self-pay

## 2014-09-16 ENCOUNTER — Ambulatory Visit: Payer: Self-pay | Attending: Internal Medicine | Admitting: Internal Medicine

## 2014-09-16 ENCOUNTER — Encounter: Payer: Self-pay | Admitting: Internal Medicine

## 2014-09-16 VITALS — BP 132/76 | HR 98 | Temp 97.8°F | Resp 16 | Wt 128.4 lb

## 2014-09-16 DIAGNOSIS — R1030 Lower abdominal pain, unspecified: Secondary | ICD-10-CM

## 2014-09-16 DIAGNOSIS — M25561 Pain in right knee: Secondary | ICD-10-CM

## 2014-09-16 DIAGNOSIS — R42 Dizziness and giddiness: Secondary | ICD-10-CM

## 2014-09-16 DIAGNOSIS — N946 Dysmenorrhea, unspecified: Secondary | ICD-10-CM

## 2014-09-16 LAB — POCT URINALYSIS DIPSTICK
Bilirubin, UA: NEGATIVE
Glucose, UA: NEGATIVE
Ketones, UA: NEGATIVE
Leukocytes, UA: NEGATIVE
Nitrite, UA: NEGATIVE
Protein, UA: NEGATIVE
Spec Grav, UA: 1.02
Urobilinogen, UA: 0.2
pH, UA: 6

## 2014-09-16 LAB — CBC WITH DIFFERENTIAL/PLATELET
Basophils Absolute: 0.1 10*3/uL (ref 0.0–0.1)
Basophils Relative: 1 % (ref 0–1)
Eosinophils Absolute: 0.3 10*3/uL (ref 0.0–0.7)
Eosinophils Relative: 4 % (ref 0–5)
HCT: 42.1 % (ref 36.0–46.0)
Hemoglobin: 14.7 g/dL (ref 12.0–15.0)
Lymphocytes Relative: 33 % (ref 12–46)
Lymphs Abs: 2.1 10*3/uL (ref 0.7–4.0)
MCH: 28.9 pg (ref 26.0–34.0)
MCHC: 34.9 g/dL (ref 30.0–36.0)
MCV: 82.7 fL (ref 78.0–100.0)
Monocytes Absolute: 0.4 10*3/uL (ref 0.1–1.0)
Monocytes Relative: 7 % (ref 3–12)
Neutro Abs: 3.5 10*3/uL (ref 1.7–7.7)
Neutrophils Relative %: 55 % (ref 43–77)
Platelets: 352 10*3/uL (ref 150–400)
RBC: 5.09 MIL/uL (ref 3.87–5.11)
RDW: 12.8 % (ref 11.5–15.5)
WBC: 6.4 10*3/uL (ref 4.0–10.5)

## 2014-09-16 MED ORDER — MECLIZINE HCL 25 MG PO TABS: 25.0000 mg | ORAL_TABLET | Freq: Three times a day (TID) | ORAL | Status: DC | PRN

## 2014-09-16 NOTE — Progress Notes (Signed)
Patient complains of some dizziness since Monday Also states the past three or four months when she gets her period She is in a lot of pain

## 2014-09-16 NOTE — Progress Notes (Signed)
MRN: [id number] Name: Alicia Miller  Sex: female Age: 38 y.o. DOB: October 05, 1976  Allergies: Review of patient's allergies indicates no known allergies.  Chief Complaint  Patient presents with  . Dizziness  . Abdominal Pain    HPI: Patient is 38 y.o. female who comes today reported to have lower abdominal pain which is more worse during her periods, she also reported to have feeling dizziness that time and especially worse when she moves her head in particular direction she feels nausea but denies any vomiting denies any fever chills any ear pain or discharge, denies any numbness weakness, she has history of gallstones and is going to follow up with his surgeon as per patient his pain is different than the one she had from gallstones. Patient denies any urinary symptoms. Patient is also complaining of right knee pain denies any fall or trauma.  Past Medical History  Diagnosis Date  . No pertinent past medical history   . Depression     Past Surgical History  Procedure Laterality Date  . No past surgeries        Medication List       This list is accurate as of: 09/16/14 10:17 AM.  Always use your most recent med list.               ibuprofen 600 MG tablet  Commonly known as:  ADVIL,MOTRIN  Take 1 tablet (600 mg total) by mouth every 8 (eight) hours as needed (Cramping and Back Pain).     meclizine 25 MG tablet  Commonly known as:  ANTIVERT  Take 1 tablet (25 mg total) by mouth 3 (three) times daily as needed for nausea.        Meds ordered this encounter  Medications  . meclizine (ANTIVERT) 25 MG tablet    Sig: Take 1 tablet (25 mg total) by mouth 3 (three) times daily as needed for nausea.    Dispense:  30 tablet    Refill:  1    Immunization History  Administered Date(s) Administered  . Tdap 02/04/2012    Family History  Problem Relation Age of Onset  . Diabetes Father   . Cancer Sister   . Diabetes Paternal Grandmother     History    Substance Use Topics  . Smoking status: Never Smoker   . Smokeless tobacco: Never Used  . Alcohol Use: No    Review of Systems   As noted in HPI  Filed Vitals:   09/16/14 0927  BP: 132/76  Pulse: 98  Temp: 97.8 F (36.6 C)  Resp: 16    Physical Exam  Physical Exam  Constitutional: No distress.  HENT:  Both TMs visualized not congested  Eyes: EOM are normal. Pupils are equal, round, and reactive to light.  No nystagmus  Cardiovascular: Normal rate and regular rhythm.   Pulmonary/Chest: Breath sounds normal. No respiratory distress. She has no wheezes. She has no rales.  Abdominal:  Lower abdominal tenderness no rebound or guarding  Musculoskeletal: She exhibits no edema.    CBC    Component Value Date/Time   WBC 5.5 02/04/2014 1754   WBC 12.5* 02/03/2012 0605   RBC 5.02 02/04/2014 1754   RBC 4.26 02/03/2012 0605   HGB 14.5 02/04/2014 1754   HGB 12.6 02/03/2012 0605   HCT 44.3 02/04/2014 1754   HCT 36.8 02/03/2012 0605   PLT 198 02/03/2012 0605   MCV 88.2 02/04/2014 1754   MCV 86.4 02/03/2012 0605  CMP     Component Value Date/Time   NA 138 11/30/2012 1548   K 4.4 11/30/2012 1548   CL 103 11/30/2012 1548   CO2 24 11/30/2012 1548   GLUCOSE 96 11/30/2012 1548   BUN 17 11/30/2012 1548   CREATININE 0.66 11/30/2012 1548   CALCIUM 9.7 11/30/2012 1548   PROT 7.6 11/30/2012 1548   ALBUMIN 4.6 11/30/2012 1548   AST 12 11/30/2012 1548   ALT 9 11/30/2012 1548   ALKPHOS 58 11/30/2012 1548   BILITOT 0.4 11/30/2012 1548    No results found for this basename: chol, tri, ldl    No components found with this basename: hga1c    Lab Results  Component Value Date/Time   AST 12 11/30/2012  3:48 PM    Assessment and Plan  Lower abdominal pain - Plan:  Results for orders placed in visit on 09/16/14  POCT URINALYSIS DIPSTICK      Result Value Ref Range   Color, UA yellow     Clarity, UA clear     Glucose, UA negative     Bilirubin, UA negative     Ketones, UA negative      Spec Grav, UA 1.020     Blood, UA trace     pH, UA 6.0     Protein, UA negative     Urobilinogen, UA 0.2     Nitrite, UA negative     Leukocytes, UA Negative     Urinalysis Dipstick is negative for infection, trace blood likely secondary to menstrual periods, US Pelvis Complete  Dysmenorrhea Patient will take ibuprofen when necessary.  Dizziness and giddiness - Plan: COMPLETE METABOLIC PANEL WITH GFR, CBC with Differential, trial of meclizine (ANTIVERT) 25 MG tablet  Right knee pain - Plan: DG Knee Complete 4 Views Right   Return in about 3 months (around 12/17/2014).  Lorayne Marek, MD

## 2014-09-17 LAB — COMPLETE METABOLIC PANEL WITH GFR
ALT: 9 U/L (ref 0–35)
AST: 13 U/L (ref 0–37)
Albumin: 4.5 g/dL (ref 3.5–5.2)
Alkaline Phosphatase: 42 U/L (ref 39–117)
BUN: 13 mg/dL (ref 6–23)
CO2: 26 mEq/L (ref 19–32)
Calcium: 9.6 mg/dL (ref 8.4–10.5)
Chloride: 101 mEq/L (ref 96–112)
Creat: 0.58 mg/dL (ref 0.50–1.10)
GFR, Est African American: >89 mL/min
GFR, Est Non African American: >89 mL/min
Glucose, Bld: 71 mg/dL (ref 70–99)
Potassium: 4.2 mEq/L (ref 3.5–5.3)
Sodium: 138 mEq/L (ref 135–145)
Total Bilirubin: 0.5 mg/dL (ref 0.2–1.2)
Total Protein: 7.2 g/dL (ref 6.0–8.3)

## 2014-09-17 LAB — VITAMIN D 25 HYDROXY (VIT D DEFICIENCY, FRACTURES): Vit D, 25-Hydroxy: 40 ng/mL (ref 30–89)

## 2014-09-19 ENCOUNTER — Telehealth: Payer: Self-pay | Admitting: *Deleted

## 2014-09-19 ENCOUNTER — Encounter: Payer: Self-pay | Admitting: Internal Medicine

## 2014-09-19 NOTE — Telephone Encounter (Signed)
-----   Message from Lorayne Marek, MD sent at 09/19/2014 12:27 PM EST ----- Call and let the Patient know that blood work is normal.

## 2014-09-19 NOTE — Telephone Encounter (Signed)
Pt aware of lab results 

## 2014-09-20 ENCOUNTER — Telehealth: Payer: Self-pay | Admitting: Internal Medicine

## 2014-09-20 NOTE — Telephone Encounter (Signed)
Pt has Ultrasound scheduled on 09/22/14 and is requesting that the orders to be adjusted to included her whole abd rather than just a portion. Please f/u with pt.

## 2014-09-20 NOTE — Telephone Encounter (Signed)
Pt has Ultrasound scheduled on 09/22/14 and is requesting that the orders to be adjusted to included her whole abd rather than just a portion.  Please f/u with pt.

## 2014-09-22 ENCOUNTER — Ambulatory Visit (HOSPITAL_COMMUNITY)
Admission: RE | Admit: 2014-09-22 | Discharge: 2014-09-22 | Disposition: A | Discharge status: Home / Self Care | Payer: Self-pay | Source: Ambulatory Visit | Attending: Internal Medicine | Admitting: Internal Medicine

## 2014-09-22 ENCOUNTER — Other Ambulatory Visit: Payer: Self-pay | Admitting: Internal Medicine

## 2014-09-22 DIAGNOSIS — R1030 Lower abdominal pain, unspecified: Secondary | ICD-10-CM | POA: Insufficient documentation

## 2014-09-22 DIAGNOSIS — M25561 Pain in right knee: Secondary | ICD-10-CM

## 2014-09-22 DIAGNOSIS — M898X6 Other specified disorders of bone, lower leg: Secondary | ICD-10-CM | POA: Insufficient documentation

## 2014-09-28 ENCOUNTER — Telehealth: Payer: Self-pay | Admitting: Internal Medicine

## 2014-09-28 NOTE — Telephone Encounter (Signed)
Pt calling for results for ultrasound and x-ray. Please f/u with pt.

## 2014-09-30 NOTE — Telephone Encounter (Signed)
Expand All Collapse All   Pt calling for results for ultrasound and x-ray. Please f/u with pt.          Can you please call pt Alicia Miller?

## 2014-10-04 ENCOUNTER — Encounter: Payer: Self-pay | Admitting: *Deleted

## 2014-10-04 NOTE — Progress Notes (Signed)
Pt is aware of her [place] and xray results.

## 2014-10-06 ENCOUNTER — Ambulatory Visit: Payer: Self-pay | Attending: Internal Medicine | Admitting: Internal Medicine

## 2014-10-06 ENCOUNTER — Encounter: Payer: Self-pay | Admitting: Internal Medicine

## 2014-10-06 VITALS — BP 116/79 | HR 63 | Temp 97.6°F | Resp 16 | Ht 63.0 in | Wt 132.0 lb

## 2014-10-06 DIAGNOSIS — Z01411 Encounter for gynecological examination (general) (routine) with abnormal findings: Secondary | ICD-10-CM | POA: Insufficient documentation

## 2014-10-06 DIAGNOSIS — N898 Other specified noninflammatory disorders of vagina: Secondary | ICD-10-CM | POA: Insufficient documentation

## 2014-10-06 DIAGNOSIS — Z124 Encounter for screening for malignant neoplasm of cervix: Secondary | ICD-10-CM

## 2014-10-06 LAB — CERVICOVAGINAL ANCILLARY ONLY
Wet Prep (BD Affirm): NEGATIVE
Wet Prep (BD Affirm): NEGATIVE
Wet Prep (BD Affirm): NEGATIVE

## 2014-10-06 NOTE — Progress Notes (Signed)
Patient ID: Alicia Miller, female   DOB: January 04, 1976, 38 y.o.   MRN: [id number]  CC:  Pap/breast exam  HPI:  Patient presents to clinic today for a pap smear and breast exam.  She is a current patient of Dr. Annitta Needs and needs to be examined.  She would like to have STD testing today.  She denies vaginal itch, odor, or lesions.  Has had vaginal discharge since the birth of her children 2 years ago.  She notes that it is a white thin discharge.  She does not complete monthly breast exams.  Has seen a small white papule on her nipple with some malodorous discharge a few weeks ago.  Papule has resolved.    No Known Allergies Past Medical History  Diagnosis Date  . No pertinent past medical history   . Depression    Current Outpatient Prescriptions on File Prior to Visit  Medication Sig Dispense Refill  . ibuprofen (ADVIL,MOTRIN) 600 MG tablet Take 1 tablet (600 mg total) by mouth every 8 (eight) hours as needed (Cramping and Back Pain). 30 tablet 0  . meclizine (ANTIVERT) 25 MG tablet Take 1 tablet (25 mg total) by mouth 3 (three) times daily as needed for nausea. 30 tablet 1   No current facility-administered medications on file prior to visit.   Family History  Problem Relation Age of Onset  . Diabetes Father   . Cancer Sister   . Diabetes Paternal Grandmother    History   Social History  . Marital Status: Single    Spouse Name: N/A    Number of Children: N/A  . Years of Education: N/A   Occupational History  . Not on file.   Social History Main Topics  . Smoking status: Never Smoker   . Smokeless tobacco: Never Used  . Alcohol Use: No  . Drug Use: No  . Sexual Activity: Yes    Birth Control/ Protection: Condom   Other Topics Concern  . Not on file   Social History Narrative    Review of Systems  Genitourinary: Negative.   All other systems reviewed and are negative.    Objective:   Filed Vitals:   10/06/14 0917  BP: 116/79  Pulse: 63  Temp: 97.6 F  (36.4 C)  Resp: 16    Physical Exam  Cardiovascular: Normal rate, regular rhythm and normal heart sounds.   Pulmonary/Chest: Effort normal and breath sounds normal.  Abdominal: Soft. Bowel sounds are normal. She exhibits no distension. There is tenderness.  Genitourinary: Rectum normal and uterus normal. Cervix exhibits friability. Cervix exhibits no motion tenderness and no discharge. Right adnexum displays no tenderness. Left adnexum displays no tenderness. Vaginal discharge found.    Lymphadenopathy:       Right: No inguinal adenopathy present.       Left: No inguinal adenopathy present.     Lab Results  Component Value Date   WBC 6.4 09/16/2014   HGB 14.7 09/16/2014   HCT 42.1 09/16/2014   MCV 82.7 09/16/2014   PLT 352 09/16/2014   Lab Results  Component Value Date   CREATININE 0.58 09/16/2014   BUN 13 09/16/2014   NA 138 09/16/2014   K 4.2 09/16/2014   CL 101 09/16/2014   CO2 26 09/16/2014    No results found for: HGBA1C Lipid Panel  No results found for: CHOL, TRIG, HDL, CHOLHDL, VLDL, LDLCALC     Assessment and plan:   Alicia Miller was seen today for follow-up.  Diagnoses and associated  orders for this visit:  Papanicolaou smear - Cytology - PAP Webb - Cervicovaginal ancillary only - HIV antibody (with reflex) - RPR   Return if symptoms worsen or fail to improve, for with advani.        Chari Manning, NP-C [facility] [phone] 10/06/2014, 9:32 AM

## 2014-10-06 NOTE — Progress Notes (Signed)
Pt is here today for a physical and pap smear.

## 2014-10-06 NOTE — Patient Instructions (Signed)
Alicia Miller (Health Maintenance) Adoptar un estilo de vida saludable y recibir atencin preventiva pueden ser de suma utilidad para promover la salud y Musician. Hable con el mdico para saber cul es el esquema de exmenes peridicos adecuado para usted. Esta es una buena oportunidad para Teacher, adult education peridicamente al mdico sobre cmo prevenir enfermedades y [place] sano. Entre cada control mdico, hay muchas cosas que puede hacer por s solo. Los expertos han investigado mucho acerca de los cambios en el estilo de vida y las medidas preventivas que muy probablemente preserven su salud. Consulte al mdico para obtener ms informacin. EL PESO Y LA DIETA  Consuma una dieta saludable.  Incluya abundante cantidad de verduras, frutas, productos lcteos descremados y protenas magras.  No coma muchos alimentos con alto contenido de grasas slidas, azcares agregados o sal.  Realice actividad fsica con regularidad. Esta es una de las cosas ms importantes que puede hacer por su salud.  La [employer] de las personas adultas deben hacer actividad fsica durante por lo menos 140mnutos semanales. El ejercicio debe aumentar la frecuencia cardaca y hNature conservation officersudar (ejercicio de intensidad moderada).  Adems, casi todos los adultos deben hacer ejercicios de fortalecimiento al mToysRusveces por semana como complemento del ejercicio de iDante Mantenga un peso saludable.  El ndice de masa corporal ([facility] es una medida que puede usarse para identificar posibles problemas relacionados con el peso. Este ofrece un clculo estimativo de la gAir traffic controlleren funcin del peso y lAgricultural consultant El mdico puede determinar su IEncino Outpatient Surgery Center LLCy ayudarlo a aScience writery mTheatre managerun peso saludable.  Para las mujeres mayores de 20aos:  Un ISurgicare Surgical Associates Of Mahwah LLCmenor de 18,5 se considera bajo peso.  Un IBaptist Health Medical Center - North Little Rockentre 18,5 y 24,9 es normal.  Un ITerrell State Hospitalentre 25 y 29,9 es sobrepeso.  Un IMC de 30 o ms se considera  obesidad. Controlar los niveles de colesterol y lpidos en la sangre  Debe comenzar a hacerse anlisis de sangre para controlar los nAscutneyde lpidos y colesterol a partir de lSed[place] y repetir estos estudios cada 5aos.  Tal vez deba someterse a controles de los niveles de colesterol con ms frecuencia si:  Tiene los niveles de lpidos o colesterol elevados.  Es mayor de 50aos.  Tiene un riesgo alto de tener enfermedades cardacas. DETECCIN DE CNCER  Cncer de pulmn  Se recomienda realizar exmenes de deteccin de cncer de pulmn a las personas adultas que tienen entre 581y 80aos, y corren riesgo de tBest boycncer de pulmn debido a sus antecedentes de tabaquismo.  Se recomienda realizar una tomografa computarizada anual de baja dosis de los pulmones a las personas que:  Siguen fumando.  Hayan dejado de fumar en los ltimos 15aos.  Hayan fumado un paquete diario durante 30aos. El ndice ao-paquete equivale a fumar, en promedio, un paquete de cigarrillos diario durante 1ao.  Debe seguir realizndose estudios de deteccin anual hasta que hayan pasado 15aos desde que dej de fumar.  Estos estudios deben suspenderse si tiene un problema de salud que le impedira recibir tratamiento para eScience writerde pulmn. Cncer de mama  Ponga en prctica la "autoconciencia de las mamas". Esto significa reconocer la apariencia normal de las mamas y cSt. Clairs[place]  Adems implica realizarse autoexmenes peridicos de lJohnson & Johnson Informe al mdico si hay algn cambio, sin importar cun pequeo sea.  Si tiene entre 20 y 30aos, un mdico debe hacerle un examen clnico de las mamas cada 1 a 316aoscomo parte del  examen habitual de salud.  Si es mayor de 40aos, debe Electrical engineer un examen clnico de las Microsoft. Tambin debe considerar la posibilidad de realizarse una radiografa de las mamas ([place]) todos los [place].  Si tiene antecedentes familiares de cncer de  mama, hable con el mdico para saber si debe someterse a un estudio gentico.  Si tiene un riesgo alto de Animal nutritionist de mama, hable con el mdico para saber si debe hacerse una resonancia magntica y [employer].  Se recomienda una evaluacin del gen del cncer de mama (BRCA) a las mujeres que tengan familiares con tumores malignos relacionados con el BRCA. Los tumores malignos relacionados con elBRCA incluyen:  [employer].  Los [employer].  Los tumores malignos del peritoneo.  Los resultados de la evaluacin determinarn la necesidad de asesoramiento gentico y de [place] de BRCA1 y BRCA2. Cncer de cuello uterino Ya no se recomiendan los exmenes plvicos de rutina para la deteccin del cncer de cuello uterino en las mujeres que no estn embarazadas que son consideradas sujetos de bajo riesgo de Best boy cncer de los rganos de la pelvis (ovarios, tero y vagina) y que no tienen sntomas. Tal vez sea necesario realizar un examen plvico si tiene sntomas, incluidos aquellos que estn asociados con infecciones en la pelvis. Pregntele al mdico si un examen plvico de deteccin es adecuado para usted.   El Papanicolau es la prueba de deteccin del cncer de cuello uterino para las mujeres que podran Engineer, production.  Si le han realizado una histerectoma por un problema que no era cncer u otra enfermedad que podra causar cncer, ya no necesitar realizarse pruebas de Papanicolaou.  Si es mayor de 39aos y los resultados de las pruebas de Papanicolaou han sido normales durante los ltimos 10aos, ya no es necesario que se realice estos estudios.  Si ha recibido un tratamiento para el cncer de cuello uterino o para una enfermedad que podra causar cncer, necesitar realizarse una prueba de Papanicolaou y controles durante al menos 26 aos de concluido el [place].  Si ya no se realiza pruebas de Papanicolaou, debe evaluar sus factores de riesgo si estos  se modifican (por ejemplo, tiene un nuevo compaero sexual). Esta situacin puede influir en la necesidad de que se someta nuevamente a estudios de deteccin.  Algunas mujeres sufren problemas mdicos que aumentan la probabilidad de tener cncer de cuello uterino. En este caso, el mdico podr indicarle que se someta a exmenes de deteccin y pruebas de Papanicolaou con ms frecuencia.  La prueba del virus del Engineer, technical sales (VPH) es un estudio adicional que puede usarse para la deteccin del cncer de cuello uterino. Esta prueba busca la presencia del virus que puede causar cambios celulares en el cuello del tero. Las clulas que se recolectan durante la prueba de Papanicolaou pueden usarse para el VPH.  La prueba del VPH puede usarse para examinar a las [employer] Corporation de 46TKP. Someterse a International aid/development worker del VPH puede prolongar el [employer] las pruebas de Papanicolaou normales de tres a Product manager.  Adems, se debe realizar la prueba del VPH para evaluar a las mujeres de cualquier edad cuyos resultados del Papanicolau no sean claros.  Despus de los 30aos, las mujeres deben realizarse pruebas del VPH con la misma frecuencia que las pruebas de Papanicolau. Cncer colorrectal  Es posible detectar este tipo de cncer y, a menudo, es posible prevenirlo.  Generalmente, los estudios de deteccin de  rutina del cncer colorrectal empiezan a hacerse a partir de los 50aos y continan hasta los 75aos.  El mdico puede recomendar que se los haga antes, si tiene factores de riesgo de cncer de colon.  Adems, el mdico puede recomendar que use un kit de prueba casera para hallar sangre oculta en la materia fecal.  Es posible que se use una pequea cmara en el extremo de un tubo para examinar directamente el colon (sigmoidoscopa o colonoscopa), con el fin de detectar las formas ms incipientes de cncer colorrectal.  Generalmente, los estudios de deteccin de rutina se realizan  a partir de los 50aos.  El examen directo del colon debe repetirse cada 5 a 10aos hasta cumplir 75aos. Sin embargo, tal vez deba someterse a la prueba de deteccin con ms frecuencia si se encuentran formas incipientes de plipos precancerosos o pequeos tumores. Cncer de piel  Revsese la piel peridicamente desde los dedos de los pies hasta la cabeza.  Informe al mdico si aparecen nuevos lunares o si nota cambios en los que ya tiene, especialmente en la forma o el color.  Tambin notifquele si tiene un lunar cuyo tamao es ms grande que el de la goma de un lpiz.  Use siempre pantalla solar. Aplique pantalla solar tantas veces como pueda a lo largo del da.  Protjase usando mangas y pantalones largos, un sombrero de ala ancha y gafas para el sol todo el ao, siempre que est al aire libre. ENFERMEDADES CARDACAS, DIABETES E HIPERTENSIN ARTERIAL   Debe controlarse la presin arterial al menos cada 1 o 2aos. La hipertensin arterial causa enfermedades cardacas y aumenta el riesgo de ictus.  Si tiene entre 55 y 79aos, consulte al mdico si debe tomar aspirina para prevenir ictus.  Hgase anlisis peridicos para la diabetes, que incluyen la toma de una muestra de sangre para controlar el nivel de azcar en la sangre mientras est en ayunas.  Si su peso es normal y tiene un riesgo bajo de tener diabetes, hgase este anlisis una vez cada tres aos, despus de los 45aos.  Si tiene sobrepeso y un riesgo alto de sufrir diabetes, considere la posibilidad de hacerse los anlisis a una edad ms temprana o con ms frecuencia. PREVENCIN DE INFECCIONES  Hepatitis B  Si tiene un riesgo ms alto de tener hepatitisB, debe hacerse anlisis de deteccin de este virus. Se considera que tiene un alto riesgo de hepatitis B si:  Naci en un pas donde la hepatitisB es frecuente. Pregntele al mdico qu pases son considerados de alto riesgo.  Sus padres nacieron en un pas de alto  riesgo, y usted no recibi la vacuna contra la hepatitis B.  Tiene VIH o sida.  Usa agujas para inyectarse drogas.  Convive con una persona que tiene hepatitisB.  Tuvo relaciones sexuales con una persona que tiene hepatitisB.  Recibe tratamiento de hemodilisis.  Toma ciertos medicamentos para cuadros clnicos tales como cncer, trasplante de  

## 2014-10-07 LAB — CERVICOVAGINAL ANCILLARY ONLY
Chlamydia: NEGATIVE
Neisseria Gonorrhea: NEGATIVE

## 2014-10-07 LAB — CYTOLOGY - PAP

## 2014-10-07 LAB — RPR

## 2014-10-07 LAB — HIV ANTIBODY (ROUTINE TESTING W REFLEX): HIV 1&2 Ab, 4th Generation: NONREACTIVE

## 2014-10-18 ENCOUNTER — Telehealth: Payer: Self-pay | Admitting: *Deleted

## 2014-10-18 NOTE — Telephone Encounter (Signed)
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  She will require a Colpo, please schedule for patient.

## 2014-10-18 NOTE — Telephone Encounter (Signed)
Pt aware of Pap results. And appointment  Apt on 11/03/2014 at 09:00  [facility]

## 2014-10-20 ENCOUNTER — Telehealth: Payer: Self-pay | Admitting: Internal Medicine

## 2014-10-20 NOTE — Telephone Encounter (Signed)
Pt has question regarding pap smear results. Please f/u with pt.

## 2014-10-20 NOTE — Telephone Encounter (Signed)
Returned pt call, left voice massage to return call

## 2014-10-21 NOTE — Telephone Encounter (Signed)
Pt came in person for results, please f/u with pt.

## 2014-10-24 NOTE — Telephone Encounter (Signed)
Pt will like to have a referral for GYN. For F/U HPV

## 2014-10-26 NOTE — Telephone Encounter (Signed)
Noted. Forwarded to PCP (Advani). Also to Chari Manning since she performed the pap.

## 2014-10-26 NOTE — Telephone Encounter (Signed)
Please address with Advani who is her PCP. Thanks

## 2014-10-26 NOTE — Telephone Encounter (Signed)
Denise  Please put in the referral to GYN for abnormal Pap smear

## 2014-11-03 ENCOUNTER — Ambulatory Visit (INDEPENDENT_AMBULATORY_CARE_PROVIDER_SITE_OTHER): Payer: Self-pay | Admitting: Family Medicine

## 2014-11-03 ENCOUNTER — Encounter: Payer: Self-pay | Admitting: Family Medicine

## 2014-11-03 VITALS — BP 112/75 | HR 74 | Temp 98.4°F | Wt 128.3 lb

## 2014-11-03 DIAGNOSIS — R8789 Other abnormal findings in specimens from female genital organs: Secondary | ICD-10-CM

## 2014-11-03 DIAGNOSIS — R87618 Other abnormal cytological findings on specimens from cervix uteri: Secondary | ICD-10-CM

## 2014-11-03 DIAGNOSIS — B977 Papillomavirus as the cause of diseases classified elsewhere: Secondary | ICD-10-CM

## 2014-11-03 LAB — POCT URINE PREGNANCY: Preg Test, Ur: NEGATIVE

## 2014-11-03 NOTE — Patient Instructions (Signed)
Su examen de colposcopia parece buena, ninguna lesin anormal visto, siga con su PCP en un ao para repetir el PAP con la prueba de VPH. Llmenos si tiene alguna pregunta.

## 2014-11-03 NOTE — Progress Notes (Signed)
Patient ID: Alicia Miller, female   DOB: 11/25/1975, 38 y.o.   MRN: [id number] Patient ID: Alicia Miller, female   DOB: [birth year], 38 y.o.   MRN: [id number]  No chief complaint on file.   HPI Alicia Miller is a 38 y.o. female.  Here for colposcopy.  HPI  Indications: Pap smear on November 2015 showed: Normal pathology with positive HPV. Previous colposcopy: None . Prior cervical treatment: N/A.  Past Medical History  Diagnosis Date  . No pertinent past medical history   . Depression     Past Surgical History  Procedure Laterality Date  . No past surgeries      Family History  Problem Relation Age of Onset  . Diabetes Father   . Cancer Sister   . Diabetes Paternal Grandmother     Social History History  Substance Use Topics  . Smoking status: Never Smoker   . Smokeless tobacco: Never Used  . Alcohol Use: No    No Known Allergies  Current Outpatient Prescriptions  Medication Sig Dispense Refill  . ibuprofen (ADVIL,MOTRIN) 600 MG tablet Take 1 tablet (600 mg total) by mouth every 8 (eight) hours as needed (Cramping and Back Pain). 30 tablet 0  . meclizine (ANTIVERT) 25 MG tablet Take 1 tablet (25 mg total) by mouth 3 (three) times daily as needed for nausea. 30 tablet 1  . omeprazole (PRILOSEC) 10 MG capsule Take 10 mg by mouth daily.     No current facility-administered medications for this visit.    Review of Systems Review of Systems  Blood pressure 112/75, pulse 74, temperature 98.4 F (36.9 C), temperature source Oral, weight 128 lb 4.8 oz (58.196 kg), last menstrual period 10/11/2014.  Physical Exam Physical Exam  Data Reviewed 11/03/14  Assessment    Procedure Details  The risks and benefits of the procedure and Written informed consent obtained.  Speculum placed in vagina and excellent visualization of cervix achieved, cervix swabbed x 3 with acetic acid solution. Adequate colposcopy with good view of transitional zone,  negative aceto-white, normal lugol's staining,no abnormal vessels or puntation.  Specimens: None, no abnormal lesion visualized  Complications: none.     Plan     Normal exam. Repeat co-testing in 1 yr. F/U with PCP routinely.       Alicia Miller 11/03/2014, 10:01 AM

## 2014-11-09 ENCOUNTER — Other Ambulatory Visit: Payer: Self-pay | Admitting: Emergency Medicine

## 2014-11-09 DIAGNOSIS — R87629 Unspecified abnormal cytological findings in specimens from vagina: Secondary | ICD-10-CM

## 2014-11-27 ENCOUNTER — Encounter: Payer: Self-pay | Admitting: Obstetrics & Gynecology

## 2014-11-27 DIAGNOSIS — R87810 Cervical high risk human papillomavirus (HPV) DNA test positive: Secondary | ICD-10-CM | POA: Insufficient documentation

## 2014-12-19 ENCOUNTER — Telehealth: Payer: Self-pay | Admitting: Internal Medicine

## 2014-12-19 NOTE — Telephone Encounter (Signed)
Patient called stating that she has been having sharp pain on her back radiating towards her chest and abdomen. Patient would like to get some advice on what to do next.

## 2015-01-10 ENCOUNTER — Telehealth: Payer: Self-pay | Admitting: Internal Medicine

## 2015-01-16 ENCOUNTER — Ambulatory Visit: Payer: Self-pay | Attending: Internal Medicine | Admitting: Physician Assistant

## 2015-01-16 VITALS — BP 106/70 | HR 79 | Temp 99.0°F | Resp 16 | Ht 62.0 in | Wt 128.0 lb

## 2015-01-16 DIAGNOSIS — R309 Painful micturition, unspecified: Secondary | ICD-10-CM

## 2015-01-16 LAB — POCT URINALYSIS DIPSTICK
Glucose, UA: 100
Ketones, UA: NEGATIVE
Leukocytes, UA: NEGATIVE
Nitrite, UA: POSITIVE
Protein, UA: 30
Spec Grav, UA: 1.02
Urobilinogen, UA: 1
pH, UA: 5

## 2015-01-16 MED ORDER — CIPROFLOXACIN HCL 500 MG PO TABS: 500.0000 mg | ORAL_TABLET | Freq: Two times a day (BID) | ORAL | Status: DC

## 2015-01-16 NOTE — Progress Notes (Signed)
Lower abdominal pain as well flank pain and chills.  Patient been taking Maureen Chatters (she got in [place]) 100 mg q day for 2 days She feels medication has helped. Patient reports she has had problems with her gallbladder in the past and is worried that this is related

## 2015-01-16 NOTE — Progress Notes (Signed)
Chief Complaint: Difficulty urinating and back pain  Subjective: This is a 39 year old Hispanic female who is presenting with 2 weeks of back pain, burning on urination, and difficulty urinating. She has no blood in her urine. She has no change in odor. She has been taking some over-the-counter medication with temporarily relief. She has had a low-grade fever.  She also complains of right upper quadrant pain. She has a history of gallstones. Apparently one year ago she had symptoms murmurs symptoms and an ultrasound confirmed gallstones. She was seen by gastroenterologist at [facility]. An EGD was done which showed some ulcers and she was treated for that. She did not need surgical repair at that time. She was lost to follow-up. For the last month she's had recurrent right upper quadrant pain. This wqas brought on by a large, fatty meal. Every since then she's had intermittent pain that lasts about 10 minutes.   ROS:  GEN: = fever or chills, no change in weight Skin: denies lesions or rashes HEENT: denies headache, earache, epistaxis, sore throat, or neck pain ABD: + abd pain, N or V    Objective:  Filed Vitals:   01/16/15 1152  BP: 106/70  Pulse: 79  Temp: 99 F (37.2 C)  Resp: 16  Height: 5\' 2"  (1.575 m)  Weight: 128 lb (58.06 kg)  SpO2: 99%    Physical Exam:  General: in no acute distress. HEENT: no pallor, no icterus, moist oral mucosa, no JVD, no lymphadenopathy Lungs: Clear to auscultation bilaterally. Abdomen: Soft, +tender, nondistended, positive bowel sounds.  Pertinent Lab Results:   Medications: Prior to Admission medications   Medication Sig Start Date End Date Taking? Authorizing Provider  ciprofloxacin (CIPRO) 500 MG tablet Take 1 tablet (500 mg total) by mouth 2 (two) times daily. 01/16/15   Scharlene Catalina Daneil Dan, PA-C  ibuprofen (ADVIL,MOTRIN) 600 MG tablet Take 1 tablet (600 mg total) by mouth every 8 (eight) hours as needed (Cramping and Back Pain). 04/07/14    Tresa Garter, MD  meclizine (ANTIVERT) 25 MG tablet Take 1 tablet (25 mg total) by mouth 3 (three) times daily as needed for nausea. 09/16/14   Lorayne Marek, MD  omeprazole (PRILOSEC) 10 MG capsule Take 10 mg by mouth daily.    Historical Provider, MD    Assessment: 1. Acute cystitis 2. Right upper quadrant pain with history of cholelithiasis  Plan: Cipro Symptom relief Dietary modification Referral to GI  Follow up: as scheduled  The patient was given clear instructions to go to ER or return to medical center if symptoms don't improve, worsen or new problems develop. The patient verbalized understanding. The patient was told to call to get lab results if they haven't heard anything in the next week.   This note has been created with Surveyor, quantity. Any transcriptional errors are unintentional.   Zettie Pho, PA-C 01/16/2015, 12:12 PM

## 2015-01-18 ENCOUNTER — Telehealth: Payer: Self-pay | Admitting: Internal Medicine

## 2015-01-18 NOTE — Telephone Encounter (Signed)
3 days of treatment is enough. If she finished 3 days of cipro she may stop.

## 2015-01-18 NOTE — Telephone Encounter (Signed)
Pt aware of information Stated took medication this morning (information was given in [place])

## 2015-01-18 NOTE — Telephone Encounter (Signed)
Pt called stating that the antibiotics given to her are causing her headaches and abdominal pain . Please f/u with pt

## 2015-01-26 ENCOUNTER — Ambulatory Visit (HOSPITAL_COMMUNITY)
Admission: RE | Admit: 2015-01-26 | Discharge: 2015-01-26 | Disposition: A | Discharge status: Home / Self Care | Payer: Self-pay | Source: Ambulatory Visit | Attending: Internal Medicine | Admitting: Internal Medicine

## 2015-01-26 ENCOUNTER — Ambulatory Visit: Payer: Self-pay | Attending: Internal Medicine | Admitting: Internal Medicine

## 2015-01-26 ENCOUNTER — Encounter: Payer: Self-pay | Admitting: Internal Medicine

## 2015-01-26 VITALS — BP 105/70 | HR 76 | Temp 98.0°F | Resp 16 | Wt 132.2 lb

## 2015-01-26 DIAGNOSIS — R319 Hematuria, unspecified: Secondary | ICD-10-CM | POA: Insufficient documentation

## 2015-01-26 DIAGNOSIS — Z8719 Personal history of other diseases of the digestive system: Secondary | ICD-10-CM | POA: Insufficient documentation

## 2015-01-26 DIAGNOSIS — Z79899 Other long term (current) drug therapy: Secondary | ICD-10-CM | POA: Insufficient documentation

## 2015-01-26 DIAGNOSIS — K8020 Calculus of gallbladder without cholecystitis without obstruction: Secondary | ICD-10-CM

## 2015-01-26 DIAGNOSIS — Z87440 Personal history of urinary (tract) infections: Secondary | ICD-10-CM | POA: Insufficient documentation

## 2015-01-26 DIAGNOSIS — F329 Major depressive disorder, single episode, unspecified: Secondary | ICD-10-CM | POA: Insufficient documentation

## 2015-01-26 DIAGNOSIS — Z792 Long term (current) use of antibiotics: Secondary | ICD-10-CM | POA: Insufficient documentation

## 2015-01-26 DIAGNOSIS — F32A Depression, unspecified: Secondary | ICD-10-CM

## 2015-01-26 LAB — POCT URINALYSIS DIPSTICK
Bilirubin, UA: NEGATIVE
Glucose, UA: NEGATIVE
Ketones, UA: NEGATIVE
Leukocytes, UA: NEGATIVE
Nitrite, UA: NEGATIVE
Protein, UA: NEGATIVE
Spec Grav, UA: 1.025
Urobilinogen, UA: 0.2
pH, UA: 5.5

## 2015-01-26 MED ORDER — SERTRALINE HCL 25 MG PO TABS: 25.0000 mg | ORAL_TABLET | Freq: Every day | ORAL | Status: DC

## 2015-01-26 NOTE — Progress Notes (Signed)
Patient here for follow up on her UTI Patient had a reaction to the antibiotic-cipro and was told to stop taking it Patient states she only took the medication for three days Complains of feeling fatigued and sometimes when she wakes up her Hands and feet are swollen

## 2015-01-26 NOTE — Progress Notes (Signed)
MRN: [id number] Name: Alicia Miller  Sex: female Age: 39 y.o. DOB: Nov 06, 1976  Allergies: Review of patient's allergies indicates no known allergies.  Chief Complaint  Patient presents with  . Follow-up    HPI: Patient is 39 y.o. female who was seen in our office 2 weeks for urinary symptoms, EMR reviewed patient was prescribed Cipro which she took for 3 days, today UA is negative for nitrites but noticed to have persistent blood in the urine, she denies any history of urinary stones but she does have history of gallstones and sometimes she has upper abdominal pain when she eats greasy food, in the past she was referred to general surgery, she is requesting another referral. Patient also has been having depression symptoms for several months as per patient it is affecting her daily life and would like to try some medication, she denies any SI or HI.  Past Medical History  Diagnosis Date  . No pertinent past medical history   . Depression     Past Surgical History  Procedure Laterality Date  . No past surgeries        Medication List       This list is accurate as of: 01/26/15 12:33 PM.  Always use your most recent med list.               ciprofloxacin 500 MG tablet  Commonly known as:  CIPRO  Take 1 tablet (500 mg total) by mouth 2 (two) times daily.     ibuprofen 600 MG tablet  Commonly known as:  ADVIL,MOTRIN  Take 1 tablet (600 mg total) by mouth every 8 (eight) hours as needed (Cramping and Back Pain).     meclizine 25 MG tablet  Commonly known as:  ANTIVERT  Take 1 tablet (25 mg total) by mouth 3 (three) times daily as needed for nausea.     omeprazole 10 MG capsule  Commonly known as:  PRILOSEC  Take 10 mg by mouth daily.     sertraline 25 MG tablet  Commonly known as:  ZOLOFT  Take 1 tablet (25 mg total) by mouth daily.        Meds ordered this encounter  Medications  . sertraline (ZOLOFT) 25 MG tablet    Sig: Take 1 tablet (25 mg total)  by mouth daily.    Dispense:  30 tablet    Refill:  3    Immunization History  Administered Date(s) Administered  . Tdap 02/04/2012    Family History  Problem Relation Age of Onset  . Diabetes Father   . Cancer Sister   . Diabetes Paternal Grandmother     History  Substance Use Topics  . Smoking status: Never Smoker   . Smokeless tobacco: Never Used  . Alcohol Use: No    Review of Systems   As noted in HPI  Filed Vitals:   01/26/15 1200  BP: 105/70  Pulse: 76  Temp: 98 F (36.7 C)  Resp: 16    Physical Exam  Physical Exam  Constitutional: No distress.  Eyes: EOM are normal. Pupils are equal, round, and reactive to light.  Cardiovascular: Normal rate and regular rhythm.   Pulmonary/Chest: Breath sounds normal. No respiratory distress. She has no wheezes. She has no rales.  Abdominal:  Minimal suprapubic tenderness, no rebound or guarding, no CVA tenderness.  Musculoskeletal: She exhibits no edema.    CBC    Component Value Date/Time   WBC 6.4 09/16/2014 1020   WBC  5.5 02/04/2014 1754   RBC 5.09 09/16/2014 1020   RBC 5.02 02/04/2014 1754   HGB 14.7 09/16/2014 1020   HGB 14.5 02/04/2014 1754   HCT 42.1 09/16/2014 1020   HCT 44.3 02/04/2014 1754   PLT 352 09/16/2014 1020   MCV 82.7 09/16/2014 1020   MCV 88.2 02/04/2014 1754   LYMPHSABS 2.1 09/16/2014 1020   MONOABS 0.4 09/16/2014 1020   EOSABS 0.3 09/16/2014 1020   BASOSABS 0.1 09/16/2014 1020    CMP     Component Value Date/Time   NA 138 09/16/2014 1020   K 4.2 09/16/2014 1020   CL 101 09/16/2014 1020   CO2 26 09/16/2014 1020   GLUCOSE 71 09/16/2014 1020   BUN 13 09/16/2014 1020   CREATININE 0.58 09/16/2014 1020   CALCIUM 9.6 09/16/2014 1020   PROT 7.2 09/16/2014 1020   ALBUMIN 4.5 09/16/2014 1020   AST 13 09/16/2014 1020   ALT 9 09/16/2014 1020   ALKPHOS 42 09/16/2014 1020   BILITOT 0.5 09/16/2014 1020   GFRNONAA >89 09/16/2014 1020   GFRAA >89 09/16/2014 1020    No results  found for: CHOL  No components found for: HGA1C  Lab Results  Component Value Date/Time   AST 13 09/16/2014 10:20 AM    Assessment and Plan  History of UTI - Plan:  Results for orders placed or performed in visit on 01/26/15  Urinalysis Dipstick  Result Value Ref Range   Color, UA yellow    Clarity, UA clear    Glucose, UA neg    Bilirubin, UA neg    Ketones, UA neg    Spec Grav, UA 1.025    Blood, UA moderate    pH, UA 5.5    Protein, UA neg    Urobilinogen, UA 0.2    Nitrite, UA neg    Leukocytes, UA Negative    Urinalysis Dipstick is negative for nitrites, noticed persistent  blood in the urine, patient denies having menstrual periods currently, will get an abdominal x-ray KUB, we'll send urine for Urinalysis with Culture Reflex  Hematuria - Plan: DG Abd 1 View, Urinalysis with Culture Reflex  Depression - Plan: trial of sertraline (ZOLOFT) 25 MG tablet , advise patient to get immediate medical attention if she has any worsening of symptoms are developed any SI or HI,  Gall stones - Plan: Ambulatory referral to General Surgery   Return in about 3 months (around 04/28/2015) for depression.   This note has been created with Surveyor, quantity. Any transcriptional errors are unintentional.    Lorayne Marek, MD

## 2015-01-27 ENCOUNTER — Telehealth: Payer: Self-pay

## 2015-01-27 LAB — URINALYSIS W MICROSCOPIC + REFLEX CULTURE
Bacteria, UA: NONE SEEN
Bilirubin Urine: NEGATIVE
Casts: NONE SEEN
Crystals: NONE SEEN
Glucose, UA: NEGATIVE mg/dL
Ketones, ur: NEGATIVE mg/dL
Leukocytes, UA: NEGATIVE
Nitrite: NEGATIVE
Protein, ur: NEGATIVE mg/dL
Specific Gravity, Urine: 1.023 (ref 1.005–1.030)
Urobilinogen, UA: 0.2 mg/dL (ref 0.0–1.0)
pH: 5 (ref 5.0–8.0)

## 2015-01-27 NOTE — Telephone Encounter (Signed)
-----   Message from Lorayne Marek, MD sent at 01/27/2015 10:53 AM EST ----- Call and let the patient know that her UA stll shows Hb/blood, patient has already been referred to Urology

## 2015-01-27 NOTE — Telephone Encounter (Signed)
Interpreter used  Patient is aware that she needs to see urologist

## 2015-02-09 ENCOUNTER — Ambulatory Visit: Payer: Self-pay | Attending: Internal Medicine

## 2015-03-10 ENCOUNTER — Telehealth: Payer: Self-pay | Admitting: Internal Medicine

## 2015-03-16 ENCOUNTER — Ambulatory Visit: Payer: Self-pay | Attending: Internal Medicine | Admitting: Family Medicine

## 2015-03-16 VITALS — BP 115/76 | HR 72 | Temp 98.2°F | Resp 18 | Ht 62.0 in | Wt 132.0 lb

## 2015-03-16 DIAGNOSIS — R110 Nausea: Secondary | ICD-10-CM

## 2015-03-16 LAB — POCT URINE PREGNANCY: Preg Test, Ur: NEGATIVE

## 2015-03-16 NOTE — Progress Notes (Signed)
Body aches, nausea, "On Tuesday, even the smell of food made me nauseous", a few days ago patient felt like she was breathing cold air into chest. No pain, but weak and tired.

## 2015-03-16 NOTE — Patient Instructions (Signed)
Bland, non-spicy foods for a fews days Sips liquids frequencly Pregnancy test negative Follow-up if symptoms worsen or not resolving by Monday

## 2015-03-16 NOTE — Progress Notes (Signed)
Subjective:     Patient ID: Alicia Miller, female   DOB: 07/05/1976, 39 y.o.   MRN: [id number]  HPI   2-3 days history of achiness and nausea. She has had no fever, no vomiting, no diarrhea. He reports she has just finished her period.She denies abd pain. No upper respiratory symptoms.   Review of Systems   See HPI     Objective:   Physical Exam   Alert, oriented, appropriate, in no distress. Skin is warm and dry. Lungs are clear., HS regular. Abd with normoactive BS without tenderness or guarding     Assessment:     Nausea, possibly related to something she ate or a mild virus.    Plan:     Criss Rosales diet with frequent sips of liquids for a few days Follow-up if symptoms worsen or are not resolved by Monday A pregnancy test was negative

## 2015-04-06 ENCOUNTER — Other Ambulatory Visit: Payer: Self-pay | Admitting: Internal Medicine

## 2015-04-06 ENCOUNTER — Ambulatory Visit: Payer: Self-pay | Attending: Internal Medicine | Admitting: Family Medicine

## 2015-04-06 VITALS — BP 111/71 | HR 82 | Temp 98.5°F | Wt 132.4 lb

## 2015-04-06 DIAGNOSIS — J029 Acute pharyngitis, unspecified: Secondary | ICD-10-CM

## 2015-04-06 LAB — POCT RAPID STREP A (OFFICE): Rapid Strep A Screen: NEGATIVE

## 2015-04-06 MED ORDER — LORATADINE 10 MG PO TABS: 10.0000 mg | ORAL_TABLET | Freq: Every day | ORAL | Status: DC

## 2015-04-06 NOTE — Patient Instructions (Signed)
You do not have strep so you do not need an antibiotic. I am prescribing claritin to help with the itching. Warm salt water gargles may help Over-the counter Hall's Breezer will help the sorethroat. Should resolve in a few more days.

## 2015-04-06 NOTE — Progress Notes (Signed)
Pt c/o  Cough,st and body aches x 3 days.

## 2015-04-06 NOTE — Progress Notes (Signed)
Subjective:     Patient ID: Alicia Miller, female   DOB: 09-Jul-1976, 39 y.o.   MRN: [id number]  HPI   Presents for a 3 day history of ST, fever, body aches. Her husband has had recently similar symptoms. She reports a temp of 101+ day before yesterday. She complains of itchy throat and ears.   Review of Systems   She denies significant runny/stuffy nose or cough. Denies headaches, chest pain or SOB    Objective:   Physical Exam   Alert, oriented appropriate Skin warm and dry TMs appear normal Eyes clear Nose clear Neck supple FROM w/o adenopathy or tenderness Throat shows redness and sligh swelling of tonsil on the right, w/o exudate. A strep screen is negative Lungs are clear HS are regular w/o m,g,r.     Assessment:     Viral pharyngitis    Plan:     Discussed symptomatic measures including warm salt water gargles and Halls Breezers Claritin 10 mg. # 30. One po q day for itching Follow-up if symptoms worsen or are not improving in several more days.

## 2015-05-19 HISTORY — PX: CHOLECYSTECTOMY: SHX55

## 2015-05-29 ENCOUNTER — Ambulatory Visit: Payer: Self-pay | Admitting: Family Medicine

## 2015-05-29 ENCOUNTER — Telehealth: Payer: Self-pay | Admitting: *Deleted

## 2015-05-29 NOTE — Telephone Encounter (Signed)
Langley Gauss can you take care of this

## 2015-06-09 ENCOUNTER — Other Ambulatory Visit: Payer: Self-pay

## 2015-06-09 DIAGNOSIS — K8020 Calculus of gallbladder without cholecystitis without obstruction: Secondary | ICD-10-CM

## 2015-06-13 DIAGNOSIS — K8020 Calculus of gallbladder without cholecystitis without obstruction: Secondary | ICD-10-CM | POA: Insufficient documentation

## 2015-08-11 ENCOUNTER — Encounter: Payer: Self-pay | Admitting: Family Medicine

## 2015-08-11 ENCOUNTER — Ambulatory Visit: Payer: Self-pay | Attending: Family Medicine | Admitting: Family Medicine

## 2015-08-11 VITALS — BP 100/62 | HR 82 | Temp 98.4°F | Resp 16 | Ht 62.0 in | Wt 136.0 lb

## 2015-08-11 DIAGNOSIS — R3129 Other microscopic hematuria: Secondary | ICD-10-CM

## 2015-08-11 DIAGNOSIS — R0689 Other abnormalities of breathing: Secondary | ICD-10-CM | POA: Insufficient documentation

## 2015-08-11 DIAGNOSIS — R0989 Other specified symptoms and signs involving the circulatory and respiratory systems: Secondary | ICD-10-CM

## 2015-08-11 DIAGNOSIS — J309 Allergic rhinitis, unspecified: Secondary | ICD-10-CM | POA: Insufficient documentation

## 2015-08-11 DIAGNOSIS — R0683 Snoring: Secondary | ICD-10-CM | POA: Insufficient documentation

## 2015-08-11 DIAGNOSIS — R312 Other microscopic hematuria: Secondary | ICD-10-CM | POA: Insufficient documentation

## 2015-08-11 DIAGNOSIS — K219 Gastro-esophageal reflux disease without esophagitis: Secondary | ICD-10-CM | POA: Insufficient documentation

## 2015-08-11 LAB — COMPLETE METABOLIC PANEL WITH GFR
ALT: 13 U/L (ref 6–29)
AST: 14 U/L (ref 10–30)
Albumin: 4.3 g/dL (ref 3.6–5.1)
Alkaline Phosphatase: 53 U/L (ref 33–115)
BUN: 13 mg/dL (ref 7–25)
CO2: 25 mmol/L (ref 20–31)
Calcium: 9.1 mg/dL (ref 8.6–10.2)
Chloride: 100 mmol/L (ref 98–110)
Creat: 0.77 mg/dL (ref 0.50–1.10)
GFR, Est African American: >89 mL/min (ref >=60)
GFR, Est Non African American: >89 mL/min (ref >=60)
Glucose, Bld: 77 mg/dL (ref 65–99)
Potassium: 3.8 mmol/L (ref 3.5–5.3)
Sodium: 137 mmol/L (ref 135–146)
Total Bilirubin: 0.3 mg/dL (ref 0.2–1.2)
Total Protein: 7.3 g/dL (ref 6.1–8.1)

## 2015-08-11 LAB — POCT URINALYSIS DIPSTICK
Bilirubin, UA: NEGATIVE
Glucose, UA: NEGATIVE
Ketones, UA: NEGATIVE
Leukocytes, UA: NEGATIVE
Nitrite, UA: NEGATIVE
Protein, UA: NEGATIVE
Spec Grav, UA: 1.02
Urobilinogen, UA: 1
pH, UA: 7

## 2015-08-11 LAB — CBC
HCT: 41.9 % (ref 36.0–46.0)
Hemoglobin: 14.4 g/dL (ref 12.0–15.0)
MCH: 28.4 pg (ref 26.0–34.0)
MCHC: 34.4 g/dL (ref 30.0–36.0)
MCV: 82.6 fL (ref 78.0–100.0)
MPV: 9.7 fL (ref 8.6–12.4)
Platelets: 350 10*3/uL (ref 150–400)
RBC: 5.07 MIL/uL (ref 3.87–5.11)
RDW: 13.3 % (ref 11.5–15.5)
WBC: 8.9 10*3/uL (ref 4.0–10.5)

## 2015-08-11 MED ORDER — LORATADINE 10 MG PO TABS: 10.0000 mg | ORAL_TABLET | Freq: Every day | ORAL | Status: DC

## 2015-08-11 NOTE — Progress Notes (Signed)
Patient ID: Alicia Miller, female   DOB: June 06, 1976, 39 y.o.   MRN: [id number]   Subjective:  Patient ID: Alicia Miller, female    DOB: 08-02-1976  Age: 39 y.o. MRN: [id number]  spanish interpreter used  CC: Establish Care   HPI Alicia Miller presents for choking sensation  1. Choking sensation: at night for 1 + years. Feeling of mucus getting stuck in throat. This is worrisome to patient. She snores.  2. microscopic hematuria: in 01/2015.  Patient asked to be referred to renal.  No dysuria, flank pain or groin pain.   3. Ankle swelling: L lateral ankle. Hx of previous injury. No redness. Swelling comes and goes.  4. Chest pain: intermittent sharp pains in L anterior. This has been coming and going for years. No associated fever, cough, SOB, or reflux.  Social History  Substance Use Topics  . Smoking status: Never Smoker   . Smokeless tobacco: Never Used  . Alcohol Use: No    Outpatient Prescriptions Prior to Visit  Medication Sig Dispense Refill  . ibuprofen (ADVIL,MOTRIN) 600 MG tablet TAKE 1 TABLET BY MOUTH EVERY 8 HOURS AS NEEDED FOR CRAMPING AND BACK PAIN (Patient not taking: Reported on 08/11/2015) 30 tablet 0  . loratadine (CLARITIN) 10 MG tablet Take 1 tablet (10 mg total) by mouth daily. (Patient not taking: Reported on 08/11/2015) 30 tablet 0  . meclizine (ANTIVERT) 25 MG tablet Take 1 tablet (25 mg total) by mouth 3 (three) times daily as needed for nausea. (Patient not taking: Reported on 04/06/2015) 30 tablet 1  . omeprazole (PRILOSEC) 10 MG capsule Take 10 mg by mouth daily.    . sertraline (ZOLOFT) 25 MG tablet Take 1 tablet (25 mg total) by mouth daily. (Patient not taking: Reported on 04/06/2015) 30 tablet 3   No facility-administered medications prior to visit.    ROS Review of Systems  Constitutional: Negative for fever and chills.  Eyes: Negative for visual disturbance.  Respiratory: Negative for chest tightness and shortness of breath.     Cardiovascular: Positive for chest pain.  Gastrointestinal: Negative for abdominal pain and blood in stool.  Musculoskeletal: Positive for back pain and joint swelling. Negative for arthralgias.  Skin: Negative for rash.  Allergic/Immunologic: Negative for immunocompromised state.  Hematological: Negative for adenopathy. Does not bruise/bleed easily.  Psychiatric/Behavioral: Negative for suicidal ideas and dysphoric mood.    Objective:  BP 100/62 mmHg  Pulse 82  Temp(Src) 98.4 F (36.9 C) (Oral)  Resp 16  Ht 5\' 2"  (1.575 m)  Wt 136 lb (61.689 kg)  BMI 24.87 kg/m2  SpO2 98%  LMP 08/04/2015  BP/Weight 08/11/2015 04/06/2015 9/76/7341  Systolic BP 937 902 409  Diastolic BP 62 71 76  Wt. (Lbs) 136 132.4 132  BMI 24.87 24.21 24.14   Physical Exam  Constitutional: She is oriented to person, place, and time. She appears well-developed and well-nourished. No distress.  HENT:  Head: Normocephalic and atraumatic.  Mouth/Throat: Oropharynx is clear and moist. No oropharyngeal exudate.    Neck: No thyromegaly present.  Cardiovascular: Intact distal pulses.   Pulmonary/Chest: Effort normal.  Musculoskeletal: She exhibits no edema.  Lymphadenopathy:    She has no cervical adenopathy.  Neurological: She is alert and oriented to person, place, and time.  Skin: Skin is warm and dry. No rash noted.  Psychiatric: She has a normal mood and affect.     Assessment & Plan:   Problem List Items Addressed This Visit    Allergic rhinitis  Relevant Medications   loratadine (CLARITIN) 10 MG tablet   Choking episode occurring at night   Relevant Orders   Ambulatory referral to ENT   Microscopic hematuria - Primary   Relevant Orders   POCT urinalysis dipstick   Urine culture   COMPLETE METABOLIC PANEL WITH GFR   CBC   Snoring   Relevant Orders   Ambulatory referral to ENT      Meds ordered this encounter  Medications  . loratadine (CLARITIN) 10 MG tablet    Sig: Take 1 tablet  (10 mg total) by mouth daily.    Dispense:  30 tablet    Refill:  5    Follow-up: Return in about 3 months (around 11/10/2015).   Boykin Nearing MD

## 2015-08-11 NOTE — Progress Notes (Signed)
Establish Care with pcp C/C swelling on foot bilateral  Stated has pain rt side pain with deep breath  Referral

## 2015-08-11 NOTE — Patient Instructions (Addendum)
[name],  Thank you for coming in today Exam normal today except for enlargement of R tonsil.  [name] was seen today for establish care.  Diagnoses and all orders for this visit:  Microscopic hematuria -     POCT urinalysis dipstick -     Urine culture -     COMPLETE METABOLIC PANEL WITH GFR -     CBC  Allergic rhinitis, unspecified allergic rhinitis type -     loratadine (CLARITIN) 10 MG tablet; Take 1 tablet (10 mg total) by mouth daily.  Snoring -     Ambulatory referral to ENT  Choking episode occurring at night -     Ambulatory referral to ENT   Ankle swelling, very common. Nothing to be concerned about Ice Compression Elevation   F/u in 3-4 months   Dr. Adrian Blackwater

## 2015-08-13 LAB — URINE CULTURE: Colony Count: 30000

## 2015-08-16 ENCOUNTER — Ambulatory Visit: Payer: Self-pay | Attending: Family Medicine

## 2015-08-17 ENCOUNTER — Telehealth: Payer: Self-pay | Admitting: *Deleted

## 2015-08-17 NOTE — Telephone Encounter (Signed)
-----   Message from Boykin Nearing, MD sent at 08/16/2015 10:31 AM EDT ----- Still with small blood in urine Urine culture negative  Renal function is normal Patient does not need to see a nephrologist at this time Will monitor hematuria based on symptoms of pain, frequency or urgency

## 2015-08-17 NOTE — Telephone Encounter (Signed)
Date of birth verified by pt Results given  Results given in Spanish Pt verbalized understanding  Will call if sx worsen

## 2015-09-20 ENCOUNTER — Encounter: Payer: Self-pay | Admitting: Family Medicine

## 2015-09-20 DIAGNOSIS — K219 Gastro-esophageal reflux disease without esophagitis: Secondary | ICD-10-CM

## 2015-09-20 MED ORDER — OMEPRAZOLE 40 MG PO CPDR: 40.0000 mg | DELAYED_RELEASE_CAPSULE | Freq: Every day | ORAL | Status: DC

## 2015-09-20 NOTE — Assessment & Plan Note (Signed)
Followed by ENT Dr. Wilburn Cornelia On PPI

## 2015-10-06 ENCOUNTER — Ambulatory Visit: Payer: Self-pay | Attending: Family Medicine | Admitting: Family Medicine

## 2015-10-06 ENCOUNTER — Encounter: Payer: Self-pay | Admitting: Family Medicine

## 2015-10-06 VITALS — BP 98/63 | HR 70 | Temp 97.6°F | Resp 16 | Ht 62.0 in | Wt 136.0 lb

## 2015-10-06 DIAGNOSIS — R109 Unspecified abdominal pain: Secondary | ICD-10-CM

## 2015-10-06 DIAGNOSIS — I959 Hypotension, unspecified: Secondary | ICD-10-CM | POA: Insufficient documentation

## 2015-10-06 DIAGNOSIS — Z124 Encounter for screening for malignant neoplasm of cervix: Secondary | ICD-10-CM

## 2015-10-06 DIAGNOSIS — I950 Idiopathic hypotension: Secondary | ICD-10-CM

## 2015-10-06 DIAGNOSIS — R87810 Cervical high risk human papillomavirus (HPV) DNA test positive: Secondary | ICD-10-CM

## 2015-10-06 DIAGNOSIS — K30 Functional dyspepsia: Secondary | ICD-10-CM

## 2015-10-06 DIAGNOSIS — Z0000 Encounter for general adult medical examination without abnormal findings: Secondary | ICD-10-CM

## 2015-10-06 DIAGNOSIS — M545 Low back pain: Secondary | ICD-10-CM | POA: Insufficient documentation

## 2015-10-06 DIAGNOSIS — R1011 Right upper quadrant pain: Secondary | ICD-10-CM | POA: Insufficient documentation

## 2015-10-06 DIAGNOSIS — R3129 Other microscopic hematuria: Secondary | ICD-10-CM | POA: Insufficient documentation

## 2015-10-06 DIAGNOSIS — Z9049 Acquired absence of other specified parts of digestive tract: Secondary | ICD-10-CM | POA: Insufficient documentation

## 2015-10-06 LAB — COMPLETE METABOLIC PANEL WITH GFR
ALT: 12 U/L (ref 6–29)
AST: 14 U/L (ref 10–30)
Albumin: 4.2 g/dL (ref 3.6–5.1)
Alkaline Phosphatase: 45 U/L (ref 33–115)
BUN: 10 mg/dL (ref 7–25)
CO2: 28 mmol/L (ref 20–31)
Calcium: 9.7 mg/dL (ref 8.6–10.2)
Chloride: 100 mmol/L (ref 98–110)
Creat: 0.58 mg/dL (ref 0.50–1.10)
GFR, Est African American: >89 mL/min (ref >=60)
GFR, Est Non African American: >89 mL/min (ref >=60)
Glucose, Bld: 80 mg/dL (ref 65–99)
Potassium: 4.6 mmol/L (ref 3.5–5.3)
Sodium: 137 mmol/L (ref 135–146)
Total Bilirubin: 0.4 mg/dL (ref 0.2–1.2)
Total Protein: 7.1 g/dL (ref 6.1–8.1)

## 2015-10-06 LAB — CBC
HCT: 42.4 % (ref 36.0–46.0)
Hemoglobin: 14.2 g/dL (ref 12.0–15.0)
MCH: 28 pg (ref 26.0–34.0)
MCHC: 33.5 g/dL (ref 30.0–36.0)
MCV: 83.6 fL (ref 78.0–100.0)
MPV: 10.1 fL (ref 8.6–12.4)
Platelets: 399 10*3/uL (ref 150–400)
RBC: 5.07 MIL/uL (ref 3.87–5.11)
RDW: 13.5 % (ref 11.5–15.5)
WBC: 6.7 10*3/uL (ref 4.0–10.5)

## 2015-10-06 LAB — POCT URINALYSIS DIPSTICK
Bilirubin, UA: NEGATIVE
Glucose, UA: NEGATIVE
Ketones, UA: NEGATIVE
Nitrite, UA: NEGATIVE
Protein, UA: NEGATIVE
Spec Grav, UA: 1.025
Urobilinogen, UA: 0.2
pH, UA: 6.5

## 2015-10-06 LAB — POCT URINE PREGNANCY: Preg Test, Ur: NEGATIVE

## 2015-10-06 MED ORDER — IBUPROFEN 600 MG PO TABS: 600.0000 mg | ORAL_TABLET | Freq: Three times a day (TID) | ORAL | Status: DC | PRN

## 2015-10-06 NOTE — Assessment & Plan Note (Signed)
A: low BP on today's exam P: Increase sodium and water intake CBC

## 2015-10-06 NOTE — Assessment & Plan Note (Signed)
A; persistent hematuria P: Renal US

## 2015-10-06 NOTE — Assessment & Plan Note (Signed)
A: RUQ pain, exam normal P; Reassurance

## 2015-10-06 NOTE — Progress Notes (Signed)
C/C RUQ pain x 1 month, and back pain  Pain scale #3 No tobacco user  No suicide thought in the past two weeks

## 2015-10-06 NOTE — Assessment & Plan Note (Signed)
Repeat pap done today   

## 2015-10-06 NOTE — Progress Notes (Signed)
Subjective:  Patient ID: Alicia Miller, female    DOB: 03/21/1976  Age: 39 y.o. MRN: [id number] Spanish interpreter used  CC: Abdominal Pain .  HPI Alicia Miller presents for abdominal pain  1. Abdominal pain: R upper abdomen and b/l low back. Pressure. No N/V/Diarrhea, fever. She is s/p cholecystectomy. Has scant vaginal discharge no pelvic pain.  2. Due for pap: request pap today. 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 Negative colpo. Repeat pap in one year recommended. Has pain with intercourse. Otherwise no pelvic pain. Has scant discharge without itching, irritation or odor.   3. Dizzy: intermittently dizzy and lightheaded. Feels BP is low. No emesis or diarrhea. Drinking plenty of fluids. No syncope.   Outpatient Prescriptions Prior to Visit  Medication Sig Dispense Refill  . omeprazole (PRILOSEC) 40 MG capsule Take 1 capsule (40 mg total) by mouth daily.    Marland Kitchen loratadine (CLARITIN) 10 MG tablet Take 1 tablet (10 mg total) by mouth daily. (Patient not taking: Reported on 10/06/2015) 30 tablet 5   No facility-administered medications prior to visit.    ROS Review of Systems  Constitutional: Negative for fever and chills.  Eyes: Negative for visual disturbance.  Respiratory: Negative for chest tightness and shortness of breath.   Cardiovascular: Negative for chest pain.  Gastrointestinal: Positive for abdominal pain. Negative for blood in stool.  Genitourinary: Positive for vaginal discharge and dyspareunia. Negative for genital sores, vaginal pain and pelvic pain.  Musculoskeletal: Positive for back pain and joint swelling. Negative for arthralgias.  Skin: Negative for rash.  Allergic/Immunologic: Negative for immunocompromised state.  Neurological: Positive for dizziness and light-headedness.  Hematological: Negative for adenopathy. Does not bruise/bleed easily.  Psychiatric/Behavioral: Negative for suicidal ideas and dysphoric mood.    Objective:  BP 98/63 mmHg  Pulse  70  Temp(Src) 97.6 F (36.4 C) (Oral)  Resp 16  Ht 5\' 2"  (1.575 m)  Wt 136 lb (61.689 kg)  BMI 24.87 kg/m2  SpO2 97%  LMP 09/29/2015  BP/Weight 10/06/2015 08/11/2015 [id number]  Systolic BP 98 123XX123 [id number]  Diastolic BP 63 62 71  Wt. (Lbs) 136 136 132.4  BMI 24.87 24.87 24.21    Physical Exam  Constitutional: She is oriented to person, place, and time. She appears well-developed and well-nourished. No distress.  HENT:  Head: Normocephalic and atraumatic.  Cardiovascular: Normal rate, regular rhythm, normal heart sounds and intact distal pulses.   Pulmonary/Chest: Effort normal and breath sounds normal.  Abdominal: Soft. Bowel sounds are normal. She exhibits no distension and no mass. There is no tenderness. There is no rebound and no guarding.  Genitourinary: Uterus normal. Cervix exhibits motion tenderness and friability. Cervix exhibits no discharge. No erythema, tenderness or bleeding in the vagina. No foreign body around the vagina. No signs of injury around the vagina. Vaginal discharge found.    Musculoskeletal: She exhibits no edema.  Neurological: She is alert and oriented to person, place, and time.  Skin: Skin is warm and dry. No rash noted.  Psychiatric: She has a normal mood and affect.   U preg negative UA: moderate blood, trace leukocytes   Assessment & Plan:   Problem List Items Addressed This Visit    Low BP   Microscopic hematuria   Relevant Orders   US Renal   RUQ abdominal pain - Primary   Relevant Medications   ibuprofen (ADVIL,MOTRIN) 600 MG tablet   Other Relevant Orders   COMPLETE METABOLIC PANEL WITH GFR   CBC    Other Visit Diagnoses  Pap smear for cervical cancer screening        Relevant Orders    Cytology - [employer]    Healthcare maintenance        Relevant Orders    Flu Vaccine QUAD 36+ mos IM (Completed)       No orders of the defined types were placed in this encounter.    Follow-up: No Follow-up on file.   Boykin Nearing MD

## 2015-10-06 NOTE — Patient Instructions (Addendum)
Alicia Miller was seen today for abdominal pain.  Diagnoses and all orders for this visit:  Stomach pain -     POCT urine pregnancy -     POCT urinalysis dipstick  RUQ abdominal pain -     ibuprofen (ADVIL,MOTRIN) 600 MG tablet; Take 1 tablet (600 mg total) by mouth every 8 (eight) hours as needed. -     COMPLETE METABOLIC PANEL WITH GFR -     CBC  Microscopic hematuria -     US Renal; Future  Pap smear for cervical cancer screening -     Cytology - P[facility] [place]  Healthcare maintenance -     Flu Vaccine QUAD 36+ mos IM  Idiopathic hypotension  increase intake of fluid and salt (can of soup or saltine crackers or gatorade)  You will be called with results  F/u in 6 weeks for abdominal pain and BP check  Dr. Adrian Blackwater

## 2015-10-09 LAB — CERVICOVAGINAL ANCILLARY ONLY
Chlamydia: NEGATIVE
Neisseria Gonorrhea: NEGATIVE

## 2015-10-09 LAB — CYTOLOGY - PAP

## 2015-10-10 ENCOUNTER — Telehealth: Payer: Self-pay | Admitting: Family Medicine

## 2015-10-10 DIAGNOSIS — R1011 Right upper quadrant pain: Secondary | ICD-10-CM

## 2015-10-10 DIAGNOSIS — C530 Malignant neoplasm of endocervix: Secondary | ICD-10-CM | POA: Insufficient documentation

## 2015-10-10 DIAGNOSIS — D069 Carcinoma in situ of cervix, unspecified: Secondary | ICD-10-CM

## 2015-10-10 HISTORY — DX: Carcinoma in situ of cervix, unspecified: D06.9

## 2015-10-10 LAB — CERVICOVAGINAL ANCILLARY ONLY: Wet Prep (BD Affirm): NEGATIVE

## 2015-10-10 NOTE — Assessment & Plan Note (Addendum)
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 This is called a colposcopy. Patient had this done last year when her pap was abnormal at family practice. This year she will have it done at gynecology.   A referral to gynecology has been placed.

## 2015-10-10 NOTE — Assessment & Plan Note (Signed)
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 The transformation zone was present.  Patient called and informed of abnormal pap smear  Patient referred to gyn/oncology

## 2015-10-10 NOTE — Telephone Encounter (Signed)
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 The transformation zone was present. Report available in EPIC.   Spanish interpreter called Spanish interpreter dialed patient, Alicia Miller [phone]   Patient was not available Left message requesting a call back for results   When patient calls, please route patient to clinical, Junious Dresser preferred as she speaks spanish or Heather or Dr. Adrian Blackwater if available.    Patient needs to know the following  Pap revealed adenocarcinoma in situ (this is cancer cells  that has not spread beyond the top layer of the cervix) This needs to be further tested by a biopsy. This is called a colposcopy. Patient had this done last year when her pap was abnormal at family practice. This year she will have it done at gynecology.   A referral to gynecology has been placed.

## 2015-10-10 NOTE — Telephone Encounter (Signed)
Date of birth verified by pt Pap smear results given to pt Pap revealed endocervical adenocarcinoma, Explained to pt abnormal cancer likely cancer Need  evaluation with GYN Information given in Spanish  Pt verbalized understanding

## 2015-10-17 ENCOUNTER — Telehealth: Payer: Self-pay | Admitting: Family Medicine

## 2015-10-17 NOTE — Telephone Encounter (Signed)
Please have patient make f/u with me so we can discuss the findings and the plan in person

## 2015-10-17 NOTE — Telephone Encounter (Signed)
Patient is calling because she is very concerned about her cancer diagnosis and plan for treatment. I spoke with her and she mentioned that she does not have insurance but was eligible for the [employer] discount with [place]. Alinda Sierras, if I am not mistaken she will most likely need to go through the [facility] program. Please follow up with pt. Thank you.

## 2015-10-20 ENCOUNTER — Ambulatory Visit (HOSPITAL_COMMUNITY)
Admission: RE | Admit: 2015-10-20 | Discharge: 2015-10-20 | Disposition: A | Discharge status: Home / Self Care | Payer: Self-pay | Source: Ambulatory Visit | Attending: Family Medicine | Admitting: Family Medicine

## 2015-10-20 ENCOUNTER — Encounter (HOSPITAL_COMMUNITY): Payer: Self-pay

## 2015-10-20 ENCOUNTER — Other Ambulatory Visit (HOSPITAL_COMMUNITY): Payer: Self-pay | Admitting: *Deleted

## 2015-10-20 VITALS — BP 118/72 | Temp 98.5°F | Ht 63.0 in | Wt 139.0 lb

## 2015-10-20 DIAGNOSIS — Z1239 Encounter for other screening for malignant neoplasm of breast: Secondary | ICD-10-CM

## 2015-10-20 DIAGNOSIS — C530 Malignant neoplasm of endocervix: Secondary | ICD-10-CM

## 2015-10-20 DIAGNOSIS — N644 Mastodynia: Secondary | ICD-10-CM

## 2015-10-20 DIAGNOSIS — R3129 Other microscopic hematuria: Secondary | ICD-10-CM | POA: Insufficient documentation

## 2015-10-20 NOTE — Progress Notes (Addendum)
Patient referred to BCCCP by the [facility] due to having an abnormal Pap smear 10/06/2015 that follow up is recommended.  Patient complained of right breast pain that comes and goes. Patient rated pain at a 4 out of 10. Patient complained of a possible right breast lump and a white discharge when expresses.  Pap Smear:  Pap smear not completed today. Last Pap smear was 10/06/2015 at [facility] and showed endocervical adenocarcinoma in situ. Patient has a history of a Pap smear 10/06/2014 that was normal and positive for HPV. A colposcopy was completed to follow up for prior Pap smear 11/03/2014. Referred patient to the [facility] for follow up of abnormal Pap smear 10/06/2015. Appointment scheduled for Wednesday, November 15, 2015.  Physical exam: Breasts Breasts symmetrical. No skin abnormalities bilateral breasts. No nipple retraction bilateral breasts. No nipple discharge bilateral breasts. Patient expressed a thick white discharge out of bump over areola. No lymphadenopathy. No lumps palpated bilateral breasts. Complaints of tenderness right outer and center of breast on exam. Referred patient to the [facility] for diagnostic mammogram and possible right breast ultrasound. Appointment scheduled for Tuesday, October 26, 2015 at 1045.     Pelvic/Bimanual No Pap smear completed today since last Pap smear was 10/06/2015. Pap smear not indicated per BCCCP guidelines.     Used interpreter Benjamine Sprague.

## 2015-10-20 NOTE — Patient Instructions (Signed)
Educational materials on self breast awareness given. Explained to Alicia Miller the importance of follow up for her abnormal Pap smear. Referred patient to the [facility] for follow up of abnormal Pap smear 10/06/2015. Appointment scheduled for Wednesday, November 15, 2015. Referred patient to the [facility] for diagnostic mammogram and possible right breast ultrasound. Appointment scheduled for Tuesday, October 26, 2015 at 1045. Patient aware of appointments and will be there. [name] verbalized understanding.  [name], [name], RN 4:08 PM

## 2015-10-23 ENCOUNTER — Telehealth (HOSPITAL_COMMUNITY): Payer: Self-pay | Admitting: *Deleted

## 2015-10-23 NOTE — Telephone Encounter (Signed)
Spanish interpreter Benjamine Sprague called patient for BCCCP to inform her that her follow-up appointment at the [facility] has been moved up to Wednesday, November 01, 2015 at 1245. Patient verbalized understanding and was appreciative.

## 2015-10-23 NOTE — Telephone Encounter (Signed)
-----   Message from Boykin Nearing, MD sent at 10/20/2015  1:14 PM EST ----- Normal renal ultrasound Microscopic hematuria, non smoker No further w/u needed

## 2015-10-23 NOTE — Telephone Encounter (Signed)
Date of birth verified by pt [place] results given  Pt verbalized understanding  Information given in Spanish  Pt Stated no concerned at this time  Everything is going good, got in a program for Cancer and tx on the way. Will schedule appointment if necessary in the future.

## 2015-10-26 ENCOUNTER — Ambulatory Visit
Admission: RE | Admit: 2015-10-26 | Discharge: 2015-10-26 | Disposition: A | Discharge status: Home / Self Care | Payer: No Typology Code available for payment source | Source: Ambulatory Visit | Attending: Obstetrics and Gynecology | Admitting: Obstetrics and Gynecology

## 2015-10-26 DIAGNOSIS — N644 Mastodynia: Secondary | ICD-10-CM

## 2015-11-01 ENCOUNTER — Ambulatory Visit (INDEPENDENT_AMBULATORY_CARE_PROVIDER_SITE_OTHER): Payer: Self-pay | Admitting: Obstetrics and Gynecology

## 2015-11-01 ENCOUNTER — Encounter: Payer: Self-pay | Admitting: Obstetrics and Gynecology

## 2015-11-01 VITALS — BP 143/70 | HR 71 | Temp 98.6°F | Wt 135.1 lb

## 2015-11-01 DIAGNOSIS — D069 Carcinoma in situ of cervix, unspecified: Secondary | ICD-10-CM

## 2015-11-01 HISTORY — DX: Carcinoma in situ of cervix, unspecified: D06.9

## 2015-11-01 NOTE — Progress Notes (Signed)
Patient ID: Alicia Miller, female   DOB: December 08, 1975, 39 y.o.   MRN: [id number] 39 yo G2P2 referred from [facility] due to abnormal pap smear. 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 Patient here to discuss treatment options. Patient is without complaints. She does not desire to preserve her fertility.  Past Medical History  Diagnosis Date  . No pertinent past medical history   . Depression    Past Surgical History  Procedure Laterality Date  . No past surgeries    . Cholecystectomy  05/2015   Family History  Problem Relation Age of Onset  . Diabetes Father   . Cancer Sister     uterine  . Diabetes Paternal Grandmother    Social History  Substance Use Topics  . Smoking status: Never Smoker   . Smokeless tobacco: Never Used  . Alcohol Use: No   ROS See pertinent in HPI  Blood pressure 143/70, pulse 71, temperature 98.6 F (37 C), weight 135 lb 1.6 oz (61.281 kg), last menstrual period 10/26/2015. GENERAL: Well-developed, well-nourished female in no acute distress.  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  - The patient and her husband were counseled in the presence of spanish interpreter and they wished to think about it before proceeding

## 2015-11-01 NOTE — Patient Instructions (Signed)
Procedimiento de escisin electroquirrgica con asa (Loop Electrosurgical Excision Procedure) El procedimiento de escisin electroquirrgica con asa es la extirpacin de una porcin de la parte inferior del tero (cuello). El procedimento se realiza cuando hay cambios significativamente anormales en las clulas del cuello del tero. Estos cambios pueden causar cncer si se dejan sin tratar.   El procedimiento mismo slo demora algunos minutos. Generalmente se Personal assistant del mdico. Se considera un procedimiento seguro para aquellas mujeres que desean o tratan de quedar embarazadas. Solo bajo raras circunstancias este procedimiento se realiza estando embarazada.  INFORME A SU MDICO:   Si est embarazada o no tuvo el ltimo perodo menstrual.  Alergias a alimentos o medicamentos.  Todos los [employer] [place], incluyendo vitaminas, hierbas, gotas oftlmicas, medicamentos de [place] y Proofreader.  Uso de corticoides (por va oral o cremas).  Problemas anteriores debido a anestsicos o a medicamentos que [employer] sensibilidad.  Cirugas ginecolgicas previas.  Antecedentes de hemorragias o cogulos sanguneos.  Infecciones recientes o actuales en la vagina (herpes, enfermedades de transmisin sexual).  Otros problemas de [place]. [place].  Infecciones.  Lesiones en la vagina, la vejiga o el recto.  Obstruccin rara en la abertura del cuello que causa problemas durante la menstruacin (estenosis cervical). ANTES DEL PROCEDIMIENTO   No tome aspirina ni anticoagulantes durante la semana previa al procedimiento, o segn le hayan indicado.  Consuma una comida ligera antes del procedimiento.  Consulte a su mdico si debe cambiar o suspender los medicamentos que toma habitualmente.  Le administrarn un analgsico 1  2 horas antes del procedimiento. PROCEDIMIENTO   Se coloca un instrumento (espculo) en la vagina. Esto le permite  al mdico observar el cuello.  Se aplica tintura de yodo para encontrar la zona de las clulas anormales.  Se inyecta un medicamento para adormecer el cuello (anestsico local).   Se pasa electricidad a travs de una delgada asa de alambre que luego se [place] para extirpar (cauterizar) un pequeo segmento de la zona afectada.  Se utiliza una ligera electrocauterizacin para sellar los vasos sanguneos pequeos y Engineer, water.  Aplicarn una pasta en la zona cauterizada para prevenir el sangrado.  La muestra de tejido se enva al laboratorio. Luego, se examina en el microscopio. DESPUS DEL PROCEDIMIENTO   Pdale a alguna persona que la lleve hasta su casa.  Puede sentir un clico moderado a suave.  Puede notar una secrecin vaginal negra por la pasta usada para prevenir el sangrado. Esto es normal.  Observe si tiene un sangrado excesivo. Esto requiere atencin mdica inmediata.  Consulte con su mdico la fecha en que los resultados estarn disponibles. Asegrese de [employer].   Esta informacin no tiene Marine scientist el consejo del mdico. Asegrese de hacerle al mdico cualquier pregunta que tenga.   Document Released: 07/17/2011 Document Revised: 01/27/2012 Elsevier Interactive Patient Education 2016 [place].   Abdominal Hysterectomy, Care After Refer to this sheet in the next few weeks. These instructions provide you with information on caring for yourself after your procedure. Your health care provider may also give you more specific instructions. Your treatment has been planned according to current medical practices, but problems sometimes occur. Call your health care provider if you have any problems or questions after your procedure.  WHAT TO EXPECT AFTER THE PROCEDURE After your procedure, it is typical to have the following:  Pain.  Feeling tired.  Poor appetite.  Less interest in sex. It  takes 4-6 weeks to recover from this surgery.   HOME CARE INSTRUCTIONS   Take pain medicines only as directed by your health care provider. Do not take over-the-counter pain medicines without checking with your health care provider first.  Change your bandage as directed by your health care provider.  Return to your health care provider to have your sutures taken out.  Take showers instead of baths for 2-3 weeks. Ask your health care provider when it is safe to start showering.  Do not douche, use tampons, or have sexual intercourse for at least 6 weeks or until your health care provider says you can.   Follow your health care provider's advice about exercise, lifting, driving, and general activities.  Get plenty of rest and sleep.   Do not lift anything heavier than a gallon of milk (about 10 lb [4.5 kg]) for the first month after surgery.  You can resume your normal diet if your health care provider says it is okay.   Do not drink alcohol until your health care provider says you can.   If you are constipated, ask your health care provider if you can take a mild laxative.  Eating foods high in fiber may also help with constipation. Eat plenty of raw fruits and vegetables, whole grains, and beans.  Drink enough fluids to keep your urine clear or pale yellow.   Try to have someone at home with you for the first 1-2 weeks to help around the house.  Keep all follow-up appointments. SEEK MEDICAL CARE IF:   You have chills or fever.  You have swelling, redness, or pain in the area of your incision that is getting worse.   You have pus coming from the incision.   You notice a bad smell coming from the incision or bandage.   Your incision breaks open.   You feel dizzy or light-headed.   You have pain or bleeding when you urinate.   You have persistent diarrhea.   You have persistent nausea and vomiting.   You have abnormal vaginal discharge.   You have a rash.   You have any type of abnormal  reaction or develop an allergy to your medicine.   Your pain medicine is not helping.  SEEK IMMEDIATE MEDICAL CARE IF:   You have a fever and your symptoms suddenly get worse.  You have severe abdominal pain.  You have chest pain.  You have shortness of breath.  You faint.  You have pain, swelling, or redness of your leg.  You have heavy vaginal bleeding with blood clots. MAKE SURE YOU:  Understand these instructions.  Will watch your condition.  Will get help right away if you are not doing well or get worse.   This information is not intended to replace advice given to you by your health care provider. Make sure you discuss any questions you have with your health care provider.   Document Released: 05/24/2005 Document Revised: 11/25/2014 Document Reviewed: 08/27/2013 Elsevier Interactive Patient Education [employer].

## 2015-11-08 NOTE — Addendum Note (Signed)
Encounter addended by: Loletta Parish, RN on: 11/08/2015 12:22 PM<BR>     Documentation filed: Notes Section, Demographics Visit

## 2015-11-15 ENCOUNTER — Encounter: Payer: Self-pay | Admitting: Obstetrics & Gynecology

## 2015-11-16 ENCOUNTER — Ambulatory Visit (INDEPENDENT_AMBULATORY_CARE_PROVIDER_SITE_OTHER): Payer: Self-pay | Admitting: Emergency Medicine

## 2015-11-16 VITALS — BP 112/78 | HR 86 | Temp 98.4°F | Resp 18 | Ht 63.0 in | Wt 133.0 lb

## 2015-11-16 DIAGNOSIS — J209 Acute bronchitis, unspecified: Secondary | ICD-10-CM

## 2015-11-16 MED ORDER — AZITHROMYCIN 250 MG PO TABS: ORAL_TABLET | ORAL | Status: DC

## 2015-11-16 MED ORDER — PROMETHAZINE-CODEINE 6.25-10 MG/5ML PO SYRP: 5.0000 mL | ORAL_SOLUTION | Freq: Four times a day (QID) | ORAL | Status: DC | PRN

## 2015-11-16 NOTE — Progress Notes (Signed)
Subjective:  Patient ID: Diane Cusick, female    DOB: 1976-01-31  Age: 39 y.o. MRN: [id number]  CC: Chest Pain; Cough; Sore Throat; and Headache   HPI Taiyah Ginther presents  patients acutely ill with chills but no fever. She has nasal congestion nasal discharge and postnasal drip. She characterizes it is mucoid character. She has a cough productive of mucopurulent sputum. She has no wheezing or shortness breath. No nausea vomiting no stool change. She's had no improvement with over-the-counter medication  History Sharene has a past medical history of No pertinent past medical history; Depression; and Cancer ([place]).   She has past surgical history that includes No past surgeries and Cholecystectomy (05/2015).   Her  family history includes Cancer in her sister; Diabetes in her father and paternal grandmother.  She   reports that she has never smoked. She has never used smokeless tobacco. She reports that she does not drink alcohol or use illicit drugs.  Outpatient Prescriptions Prior to Visit  Medication Sig Dispense Refill  . ibuprofen (ADVIL,MOTRIN) 600 MG tablet Take 1 tablet (600 mg total) by mouth every 8 (eight) hours as needed. (Patient not taking: Reported on 11/16/2015) 30 tablet 0  . omeprazole (PRILOSEC) 40 MG capsule Take 1 capsule (40 mg total) by mouth daily. (Patient not taking: Reported on 11/16/2015)     No facility-administered medications prior to visit.    Social History   Social History  . Marital Status: Single    Spouse Name: N/A  . Number of Children: N/A  . Years of Education: N/A   Social History Main Topics  . Smoking status: Never Smoker   . Smokeless tobacco: Never Used  . Alcohol Use: No  . Drug Use: No  . Sexual Activity: Yes    Birth Control/ Protection: Condom   Other Topics Concern  . None   Social History Narrative     Review of Systems  Constitutional: Positive for chills. Negative for fever and appetite change.    HENT: Positive for congestion, postnasal drip and rhinorrhea. Negative for ear pain, sinus pressure and sore throat.   Eyes: Negative for pain and redness.  Respiratory: Positive for cough. Negative for shortness of breath and wheezing.   Cardiovascular: Negative for leg swelling.  Gastrointestinal: Negative for nausea, vomiting, abdominal pain, diarrhea, constipation and blood in stool.  Endocrine: Negative for polyuria.  Genitourinary: Negative for dysuria, urgency, frequency and flank pain.  Musculoskeletal: Negative for gait problem.  Skin: Negative for rash.  Neurological: Negative for weakness and headaches.  Psychiatric/Behavioral: Negative for confusion and decreased concentration. The patient is not nervous/anxious.     Objective:  BP 112/78 mmHg  Pulse 86  Temp(Src) 98.4 F (36.9 C) (Oral)  Resp 18  Ht 5\' 3"  (1.6 m)  Wt 133 lb (60.328 kg)  BMI 23.57 kg/m2  SpO2 97%  LMP 10/26/2015  Physical Exam  Constitutional: She is oriented to person, place, and time. She appears well-developed and well-nourished. No distress.  HENT:  Head: Normocephalic and atraumatic.  Right Ear: External ear normal.  Left Ear: External ear normal.  Nose: Nose normal.  Eyes: Conjunctivae and EOM are normal. Pupils are equal, round, and reactive to light. No scleral icterus.  Neck: Normal range of motion. Neck supple. No tracheal deviation present.  Cardiovascular: Normal rate, regular rhythm and normal heart sounds.   Pulmonary/Chest: Effort normal. No respiratory distress. She has no wheezes. She has no rales.  Abdominal: She exhibits no mass. There is  no tenderness. There is no rebound and no guarding.  Musculoskeletal: She exhibits no edema.  Lymphadenopathy:    She has no cervical adenopathy.  Neurological: She is alert and oriented to person, place, and time. Coordination normal.  Skin: Skin is warm and dry. No rash noted.  Psychiatric: She has a normal mood and affect. Her behavior is  normal.      Assessment & Plan:   Sianne was seen today for chest pain, cough, sore throat and headache.  Diagnoses and all orders for this visit:  Acute bronchitis, unspecified organism  Other orders -     azithromycin (ZITHROMAX) 250 MG tablet; Take 2 tabs PO x 1 dose, then 1 tab PO QD x 4 days -     promethazine-codeine (PHENERGAN WITH CODEINE) 6.25-10 MG/5ML syrup; Take 5-10 mLs by mouth every 6 (six) hours as needed.  I am having Ms. Cenobio-Marquez start on azithromycin and promethazine-codeine. I am also having her maintain her omeprazole and ibuprofen.  Meds ordered this encounter  Medications  . azithromycin (ZITHROMAX) 250 MG tablet    Sig: Take 2 tabs PO x 1 dose, then 1 tab PO QD x 4 days    Dispense:  6 tablet    Refill:  0  . promethazine-codeine (PHENERGAN WITH CODEINE) 6.25-10 MG/5ML syrup    Sig: Take 5-10 mLs by mouth every 6 (six) hours as needed.    Dispense:  120 mL    Refill:  0    Appropriate red flag conditions were discussed with the patient as well as actions that should be taken.  Patient expressed his understanding.  Follow-up: Return if symptoms worsen or fail to improve.  Roselee Culver, MD

## 2015-11-16 NOTE — Patient Instructions (Signed)
Bronquitis aguda (Acute Bronchitis) La bronquitis es una inflamacin de las vas respiratorias que se extienden desde la trquea [facility] pulmones (bronquios). La inflamacin produce la formacin de mucosidad. Esto produce tos, que es el sntoma ms frecuente de la bronquitis.  Cuando la bronquitis es [place], generalmente comienza de [place] sbita y desaparece luego de un par de semanas. El hbito de fumar, las alergias y el asma pueden empeorar la bronquitis. Los episodios repetidos de bronquitis pueden causar ms problemas pulmonares.  CAUSAS La causa ms frecuente de bronquitis aguda es el mismo virus que produce el resfro. El virus puede propagarse de Ardelia Mems persona a la otra (contagioso) a travs de la tos y los estornudos, y al tocar objetos contaminados. [place].  Cristy Hilts.  Tos con mucosidad.  Dolores [employer] cuerpo.  Congestin en el pecho.  Escalofros.  Falta de aire.  Dolor de Investment banker, operational. DIAGNSTICO  La bronquitis aguda en general se diagnostica con un examen fsico. El mdico tambin le har preguntas sobre su historia clnica. En algunos casos se indican otros estudios, como radiografas, para Clinical research associate.  TRATAMIENTO  La bronquitis aguda generalmente desaparece en un par de semanas. Con frecuencia, no es Systems analyst. Los medicamentos se indican para aliviar la fiebre o la tos. Generalmente, no es necesario el uso de antibiticos, pero pueden recetarse en ciertas ocasiones. En algunos casos, se recomienda el uso de un inhalador para mejorar la falta de aire y Aeronautical engineer tos. Un vaporizador de aire fro podr ayudarlo a [employer] bronquiales y Armed forces technical officer su eliminacin.  INSTRUCCIONES PARA EL CUIDADO EN EL HOGAR  Descanse lo suficiente.  Beba lquidos en abundancia para mantener la orina de color claro o amarillo plido (excepto que padezca una enfermedad que requiera la restriccin de lquidos). El aumento  de lquidos puede ayudar a que las secreciones respiratorias (esputo) sean menos espesas y a reducir la congestin del pecho, y Mining engineer deshidratacin.  Tome los medicamentos solamente como se lo haya indicado el mdico.  Si le recetaron antibiticos, asegrese de terminarlos, incluso si comienza a sentirse mejor.  Evite fumar o aspirar el humo de otros fumadores. La exposicin al humo del cigarrillo o a irritantes qumicos har que la bronquitis empeore. Si fuma, considere el uso de goma de Higher education careers adviser o la aplicacin de parches en la piel que contengan nicotina para Public house manager los sntomas de abstinencia. Si deja de fumar, sus pulmones se curarn ms rpido.  Reduzca la probabilidad de otro episodio de bronquitis aguda lavando sus manos con frecuencia, evitando a las personas que tengan sntomas y tratando de no tocarse las manos con la boca, la nariz o los ojos.  Concurra a todas las visitas de control como se lo haya indicado el mdico. SOLICITE ATENCIN MDICA SI: Los sntomas no mejoran despus de una semana de [place].  SOLICITE ATENCIN MDICA DE INMEDIATO SI:  Comienza a tener fiebre o escalofros cada vez ms intensos.  Siente dolor en el pecho.  Le falta el aire de manera preocupante.  La flema tiene [place].  Se deshidrata.  Se desmaya o siente que va a desmayarse de forma repetida.  Tiene vmitos que se repiten.  Tiene un dolor de cabeza intenso. ASEGRESE DE QUE:   Comprende estas instrucciones.  Controlar su afeccin.  Recibir ayuda de inmediato si no mejora o si empeora.   Esta informacin no tiene Marine scientist el consejo del mdico. Asegrese de hacerle al mdico cualquier  Comprende estas instrucciones.  · Controlará su afección.  · Recibirá ayuda de inmediato si no mejora o si empeora.     Esta información no tiene como fin reemplazar el consejo del médico. Asegúrese de hacerle al médico cualquier pregunta que tenga.     Document Released: 11/04/2005 Document Revised: 11/25/2014  Elsevier Interactive Patient Education ©2016 Elsevier Inc.

## 2015-11-22 ENCOUNTER — Other Ambulatory Visit (HOSPITAL_COMMUNITY)
Admission: RE | Admit: 2015-11-22 | Discharge: 2015-11-22 | Disposition: A | Discharge status: Home / Self Care | Payer: Self-pay | Source: Ambulatory Visit | Attending: Obstetrics and Gynecology | Admitting: Obstetrics and Gynecology

## 2015-11-22 ENCOUNTER — Ambulatory Visit (INDEPENDENT_AMBULATORY_CARE_PROVIDER_SITE_OTHER): Payer: Self-pay | Admitting: Obstetrics and Gynecology

## 2015-11-22 ENCOUNTER — Encounter: Payer: Self-pay | Admitting: Obstetrics and Gynecology

## 2015-11-22 VITALS — BP 124/72 | HR 75 | Temp 98.2°F | Ht 62.0 in | Wt 135.0 lb

## 2015-11-22 DIAGNOSIS — Z3202 Encounter for pregnancy test, result negative: Secondary | ICD-10-CM

## 2015-11-22 DIAGNOSIS — D069 Carcinoma in situ of cervix, unspecified: Secondary | ICD-10-CM

## 2015-11-22 LAB — POCT PREGNANCY, URINE: Preg Test, Ur: NEGATIVE

## 2015-11-22 MED ORDER — IBUPROFEN 800 MG PO TABS
800.0000 mg | ORAL_TABLET | Freq: Once | ORAL | Status: AC
  Administered 2015-11-22: 800 mg via ORAL

## 2015-11-22 NOTE — Addendum Note (Signed)
Addended by: Shelly Coss on: 11/22/2015 02:29 PM   Modules accepted: Orders, Medications

## 2015-11-22 NOTE — Progress Notes (Signed)
Patient ID: Alicia Miller, female   DOB: 01/21/1976, 40 y.o.   MRN: [id number] 40 yo with AIS on 09/2015 pap smear here for colposcopy  Patient given informed consent, signed copy in the chart, time out was performed.  Placed in lithotomy position. Cervix viewed with speculum and colposcope after application of acetic acid.   Colposcopy adequate?  yes Acetowhite lesions?yes, surrounding the os, more pronounced from 11-2 o'clock Punctation?no Mosaicism?  no Abnormal vasculature?  no Biopsies?cervical biospy 12, 2, 5, 8, and 11 o'clock ECC?yes  COMMENTS: Patient was given post procedure instructions.  She will return in 2 weeks for results.  Mora Bellman, MD

## 2015-11-22 NOTE — Addendum Note (Signed)
Addended by: Shelly Coss on: 11/22/2015 02:36 PM   Modules accepted: Orders

## 2015-11-28 ENCOUNTER — Telehealth: Payer: Self-pay | Admitting: *Deleted

## 2015-11-28 NOTE — Telephone Encounter (Signed)
Per Dr. Elly Modena patient needs referral to gyn oncology due to cervical cancer (adenocarcinoma in situ) on colpo. Patient scheduled on Friday 1/13 at 11 at [facility]. Patient informed of appointment and results. No further questions or concerns. Larkin Ina Jiminez called patient.

## 2015-12-01 ENCOUNTER — Ambulatory Visit: Payer: Self-pay | Attending: Gynecology | Admitting: Gynecology

## 2015-12-01 ENCOUNTER — Encounter: Payer: Self-pay | Admitting: Gynecology

## 2015-12-01 VITALS — BP 122/71 | HR 72 | Temp 99.0°F | Resp 18 | Wt 136.9 lb

## 2015-12-01 DIAGNOSIS — D069 Carcinoma in situ of cervix, unspecified: Secondary | ICD-10-CM

## 2015-12-01 NOTE — Patient Instructions (Signed)
You will receive a phone call from the [facility] to arrange for the cone biopsy.  Conizacin cervical (Conization of the Cervix) La conizacin es el corte (escisin) de una porcin del cuello del tero en forma de cono. El procedimiento se realiza a travs de la vagina, en el consultorio mdico o en una sala de operaciones. Este procedimiento se realiza generalmente cuando hay una hemorragia anormal que proviene del cuello del tero. Tambin se realiza para evaluar un Papanicolau anormal o anormalidades observadas en el cuello durante un examen. El tejido se examina bajo para determinar si hay clulas precancerosas o cncer.  Este procedimiento no se Metallurgist periodo menstrual o Water quality scientist.  INFORME A SU MDICO:  Cualquier alergia que tenga.   Todos los [employer] [place], incluyendo vitaminas, hierbas, gotas oftlmicas, cremas y medicamentos de venta libre.   Problemas previos que usted o los [employer] de su familia hayan tenido con el uso de anestsicos.   Enfermedades de [employer].   Cirugas previas.   Padecimientos mdicos.   Su hbito de fumar.   Si existe un posible embarazo.  RIESGOS Y COMPLICACIONES  Generalmente, la conizacin del cuello uterino es un procedimiento seguro. Sin embargo, Games developer procedimiento, pueden surgir complicaciones. Las complicaciones posibles son:  Retail buyer un sangrado durante [employer] o semanas despus del procedimiento. Un ligero sangrado o manchado luego del procedimiento es normal.  Infeccin (raro).  Lesin en el cuello uterino o en los rganos que lo rodean (poco frecuente).   Problemas con la anestesia.   Aumento del riesgo de parto prematuro en futuros [employer]. ANTES DEL PROCEDIMIENTO  No debe comer ni beber nada durante las 6 - 8 horas previas al examen.   No tome aspirina ni anticoagulantes durante la semana previa al procedimiento, o segn le hayan indicado.   Pdale a alguna persona  que la lleve a su casa luego del procedimiento.  PROCEDIMIENTO Hay tres mtodos diferentes para Customer service manager conizacin del cuello del tero. Ellos son:   Mtodo con bistur fro: es este mtodo se corta una pequea muestra de tejido con forma de cono con un bistur (escalpelo) en el canal cervical y en la zona de transformacin (dnde terminan las clulas normales y [place] anormales).   El mtodo LEEP: en este mtodo se corta una pequea muestra de tejido en forma de cono con un cable delgado que quema (cauteriza) el tejido del cuello uterino con corriente Art therapist.   Tratamiento con lser: en este mtodo se corta una pequea muestra de tejido en forma de cono y luego se cauteriza con un rayo lser para evitar el sangrado.  El procedimiento se realizar como sigue:   Segn el mtodo, le administrarn medicamentos para Nature conservation officer dormir (anestesia general) o medicamentos para adormecer la zona (anestesia local). Podrn administrarle un medicamento para adormecer el cuello del tero (boqueo cervical).   Se inserta un dispositivo lubricado llamado espculo dentro de la vagina para separar las paredes. Esto ayudar a que el mdico pueda ver mejor el interior de la vagina y el cuello del tero.   El tejido se extirpar y ser examinado.   Los resultados sel procedimiento ayudarn al mdico a decidir si es Public house manager. Tambin ayudar al mdico a decidir el mejor tratamiento, si el resultado no es normal. DESPUS DEL PROCEDIMIENTO  Si le han administrado anestesia general se sentir mareada durante 2-3 horas despus del procedimiento.   Si le han aplicado un anestsico local, le  indicarn que haga reposo en el centro quirrgico o en el consultorio del mdico hasta que se encuentre estable y se sienta lista por volver a su casa.   La recuperacin puede demorar hasta 3 semanas.   Podr sentir algunos clicos durante alrededor de 1 semana.   El sangrado o la  hemorragia vaginal pueden durar entre 2 y 4 semanas.   Puede observar una secrecin de color negro que proviene de la vagina. Es la crema colocada en el cuello para Recruitment consultant. Esto es normal.    Esta informacin no tiene Marine scientist el consejo del mdico. Asegrese de hacerle al mdico cualquier pregunta que tenga.   Document Released: 08/14/2005 Document Revised: 07/07/2013 Elsevier Interactive Patient Education [employer].

## 2015-12-01 NOTE — Progress Notes (Signed)
Consult Note: Gyn-Onc   Alicia Miller 40 y.o. female  No chief complaint on file.   Assessment : Cervical lesion concerning for invasive cervical cancer. 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  Plan: I recommend a larger excisional biopsy (cold knife conization) to fully assess the cervix before making further recommendations for definitive therapy. Should only adenocarcinoma in situ be found then an extrafascial hysterectomy would be recommended. Should she have invasive adenocarcinoma of the cervix then a radical hysterectomy and pelvic lymphadenectomy would be recommended.  All of this was explained to the patient and her husband through a [place] interpreter.  The patient will return to Dr. Elly Modena for the conization.    HPI: 40 year old Hispanic woman gravida 2 para 2 seen in consultation request of Dr. Mora Bellman regarding management of a cervical lesion. The patient was initially evaluated secondary to a Pap smear suggesting adenocarcinoma in situ. 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  The patient reports normal cyclic menstrual periods and no significant pelvic pain.  Review of Systems:10 point review of systems is negative except as noted in interval history.   Vitals: Blood pressure 122/71, pulse 72, temperature 99 F (37.2 C), temperature source Oral, resp. rate 18, weight 136 lb 14.4 oz (62.097 kg), last menstrual period 10/26/2015, SpO2 100 %.  Physical Exam: General : The patient is a healthy woman in no acute distress.  HEENT: normocephalic, extraoccular movements normal; neck is supple without thyromegally  Lynphnodes: Supraclavicular and inguinal nodes not enlarged  Abdomen: Soft, non-tender, no ascites, no organomegally, no masses, no hernias  Pelvic:  EGBUS: Normal female  Vagina: Normal, no lesions  Urethra and Bladder: Normal, non-tender  Cervix: Normal size  but there appears to be an ulcerative lesion on the anterior cervix. Uterus: Normal shape size and consistency. Bi-manual examination: Non-tender; no adenxal masses or nodularity , there is no parametrial involvement. Rectal: normal sphincter tone, no masses, no blood  Lower extremities: No edema or varicosities. Normal range of motion      No Known Allergies  Past Medical History  Diagnosis Date  . No pertinent past medical history   . Depression   . Cancer [facility])     Past Surgical History  Procedure Laterality Date  . No past surgeries    . Cholecystectomy  05/2015    Current Outpatient Prescriptions  Medication Sig Dispense Refill  . ibuprofen (ADVIL,MOTRIN) 600 MG tablet Take 600 mg by mouth every 6 (six) hours as needed.    Marland Kitchen omeprazole (PRILOSEC) 40 MG capsule 40 mg.  11   No current facility-administered medications for this visit.    Social History   Social History  . Marital Status: Single    Spouse Name: N/A  . Number of Children: N/A  . Years of Education: N/A   Occupational History  . Not on file.   Social History Main Topics  . Smoking status: Never Smoker   . Smokeless tobacco: Never Used  . Alcohol Use: No  . Drug Use: No  . Sexual Activity: Yes    Birth Control/ Protection: Condom   Other Topics Concern  . Not on file   Social History Narrative    Family History  Problem Relation Age of Onset  . Diabetes Father   . Cancer Sister     uterine  . Diabetes Paternal Grandmother       Alvino Chapel, MD 12/01/2015, 11:38 AM       Consult  Note: Gyn-Onc   Alicia Miller 40 y.o. female  No chief complaint on file.   Assessment :  Plan:  Interval History:   HPI:  Review of Systems:10 point review of systems is negative except as noted in interval history.   Vitals: Blood pressure 122/71, pulse 72, temperature 99 F (37.2 C), temperature source Oral, resp. rate 18, weight 136 lb 14.4 oz (62.097 kg), last  menstrual period 10/26/2015, SpO2 100 %.  Physical Exam: General : The patient is a healthy woman in no acute distress.  HEENT: normocephalic, extraoccular movements normal; neck is supple without thyromegally  Lynphnodes: Supraclavicular and inguinal nodes not enlarged  Abdomen: Soft, non-tender, no ascites, no organomegally, no masses, no hernias  Pelvic:  EGBUS: Normal female  Vagina: Normal, no lesions  Urethra and Bladder: Normal, non-tender  Cervix: Surgically absent  Uterus: Surgically absent  Bi-manual examination: Non-tender; no adenxal masses or nodularity  Rectal: normal sphincter tone, no masses, no blood  Lower extremities: No edema or varicosities. Normal range of motion      No Known Allergies  Past Medical History  Diagnosis Date  . No pertinent past medical history   . Depression   . Cancer [facility])     Past Surgical History  Procedure Laterality Date  . No past surgeries    . Cholecystectomy  05/2015    Current Outpatient Prescriptions  Medication Sig Dispense Refill  . ibuprofen (ADVIL,MOTRIN) 600 MG tablet Take 600 mg by mouth every 6 (six) hours as needed.    Marland Kitchen omeprazole (PRILOSEC) 40 MG capsule 40 mg.  11   No current facility-administered medications for this visit.    Social History   Social History  . Marital Status: Single    Spouse Name: N/A  . Number of Children: N/A  . Years of Education: N/A   Occupational History  . Not on file.   Social History Main Topics  . Smoking status: Never Smoker   . Smokeless tobacco: Never Used  . Alcohol Use: No  . Drug Use: No  . Sexual Activity: Yes    Birth Control/ Protection: Condom   Other Topics Concern  . Not on file   Social History Narrative    Family History  Problem Relation Age of Onset  . Diabetes Father   . Cancer Sister     uterine  . Diabetes Paternal Grandmother       Alvino Chapel, MD 12/01/2015, 11:38 AM

## 2015-12-14 ENCOUNTER — Telehealth: Payer: Self-pay | Admitting: *Deleted

## 2015-12-14 NOTE — Telephone Encounter (Signed)
Called pt w/Alicia Miller and informed her of need for LEEP due to abnormal Cx biopsy.  Appt is 2/6@ 1345. She will watch a video on that Alam Guterrez which explains the procedure.  Pt agreed and voiced understanding.

## 2015-12-25 ENCOUNTER — Encounter: Payer: Self-pay | Admitting: Obstetrics & Gynecology

## 2015-12-25 ENCOUNTER — Ambulatory Visit (INDEPENDENT_AMBULATORY_CARE_PROVIDER_SITE_OTHER): Payer: Self-pay | Admitting: Obstetrics & Gynecology

## 2015-12-25 VITALS — BP 133/79 | HR 76 | Temp 98.5°F | Wt 135.6 lb

## 2015-12-25 DIAGNOSIS — D069 Carcinoma in situ of cervix, unspecified: Secondary | ICD-10-CM

## 2015-12-25 LAB — POCT URINALYSIS DIP (DEVICE)
Bilirubin Urine: NEGATIVE
Glucose, UA: NEGATIVE mg/dL
Ketones, ur: NEGATIVE mg/dL
Leukocytes, UA: NEGATIVE
Nitrite: NEGATIVE
Protein, ur: NEGATIVE mg/dL
Specific Gravity, Urine: >=1.03 (ref 1.005–1.030)
Urobilinogen, UA: 0.2 mg/dL (ref 0.0–1.0)
pH: 5.5 (ref 5.0–8.0)

## 2015-12-25 NOTE — Patient Instructions (Signed)
Procedimiento de escisin electroquirrgica con asa (Loop Electrosurgical Excision Procedure) El procedimiento de escisin electroquirrgica con asa es la extirpacin de una porcin de la parte inferior del tero (cuello). El procedimento se realiza cuando hay cambios significativamente anormales en las clulas del cuello del tero. Estos cambios pueden causar cncer si se dejan sin tratar.   El procedimiento mismo slo demora algunos minutos. Generalmente se Personal assistant del mdico. Se considera un procedimiento seguro para aquellas mujeres que desean o tratan de quedar embarazadas. Solo bajo raras circunstancias este procedimiento se realiza estando embarazada.  INFORME A SU MDICO:   Si est embarazada o no tuvo el ltimo perodo menstrual.  Alergias a alimentos o medicamentos.  Todos los [employer] [place], incluyendo vitaminas, hierbas, gotas oftlmicas, medicamentos de [place] y Proofreader.  Uso de corticoides (por va oral o cremas).  Problemas anteriores debido a anestsicos o a medicamentos que [employer] sensibilidad.  Cirugas ginecolgicas previas.  Antecedentes de hemorragias o cogulos sanguneos.  Infecciones recientes o actuales en la vagina (herpes, enfermedades de transmisin sexual).  Otros problemas de [place]. [place].  Infecciones.  Lesiones en la vagina, la vejiga o el recto.  Obstruccin rara en la abertura del cuello que causa problemas durante la menstruacin (estenosis cervical). ANTES DEL PROCEDIMIENTO   No tome aspirina ni anticoagulantes durante la semana previa al procedimiento, o segn le hayan indicado.  Consuma una comida ligera antes del procedimiento.  Consulte a su mdico si debe cambiar o suspender los medicamentos que toma habitualmente.  Le administrarn un analgsico 1  2 horas antes del procedimiento. PROCEDIMIENTO   Se coloca un instrumento (espculo) en la vagina. Esto le permite  al mdico observar el cuello.  Se aplica tintura de yodo para encontrar la zona de las clulas anormales.  Se inyecta un medicamento para adormecer el cuello (anestsico local).   Se pasa electricidad a travs de una delgada asa de alambre que luego se [place] para extirpar (cauterizar) un pequeo segmento de la zona afectada.  Se utiliza una ligera electrocauterizacin para sellar los vasos sanguneos pequeos y Engineer, water.  Aplicarn una pasta en la zona cauterizada para prevenir el sangrado.  La muestra de tejido se enva al laboratorio. Luego, se examina en el microscopio. DESPUS DEL PROCEDIMIENTO   Pdale a alguna persona que la lleve hasta su casa.  Puede sentir un clico moderado a suave.  Puede notar una secrecin vaginal negra por la pasta usada para prevenir el sangrado. Esto es normal.  Observe si tiene un sangrado excesivo. Esto requiere atencin mdica inmediata.  Consulte con su mdico la fecha en que los resultados estarn disponibles. Asegrese de [employer].   Esta informacin no tiene Marine scientist el consejo del mdico. Asegrese de hacerle al mdico cualquier pregunta que tenga.   Document Released: 07/17/2011 Document Revised: 01/27/2012 Elsevier Interactive Patient Education [employer].

## 2015-12-25 NOTE — Progress Notes (Signed)
Subjective:     Patient ID: Alicia Miller, female   DOB: [birth year], 40 y.o.   MRN: [id number]  [id number] Patient's last menstrual period was 12/23/2015. Patient saw Dr. Aldean Ast after colposcopy which showed adenocarcinoma in situ. He recommended LEEP but she wants to have a hysterectomy. Past Medical History  Diagnosis Date  . No pertinent past medical history   . Depression   . Cancer [facility])    Past Surgical History  Procedure Laterality Date  . No past surgeries    . Cholecystectomy  05/2015   OB History    Gravida Para Term Preterm AB TAB SAB Ectopic Multiple Living   2 2 2  0 0 0 0  0 2     No Known Allergies Current Outpatient Prescriptions on File Prior to Visit  Medication Sig Dispense Refill  . ibuprofen (ADVIL,MOTRIN) 600 MG tablet Take 600 mg by mouth every 6 (six) hours as needed. Reported on 12/25/2015    . omeprazole (PRILOSEC) 40 MG capsule 40 mg. Reported on 12/25/2015  11   No current facility-administered medications on file prior to visit.      Review of Systems  Constitutional: Negative.   Genitourinary: Positive for vaginal bleeding. Negative for vaginal discharge, menstrual problem and pelvic pain.       Objective:   Physical Exam  Constitutional: She is oriented to person, place, and time. She appears well-developed. No distress.  Neurological: She is alert and oriented to person, place, and time.  Psychiatric: She has a normal mood and affect. Her behavior is normal.       Assessment:     Patient Active Problem List   Diagnosis Date Noted  . Adenocarcinoma in situ (AIS) of uterine cervix 11/01/2015  . 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 RUQ abdominal pain 10/06/2015  . Low BP 10/06/2015  . Cervical high risk HPV (human papillomavirus) test positive 10/06/2015  . Microscopic hematuria 08/11/2015  . Allergic rhinitis 08/11/2015  . Snoring 08/11/2015  . Laryngopharyngeal reflux 08/11/2015  . Biliary calculi 06/13/2015   . Abdominal pain 05/09/2014  . Back muscle spasm 04/22/2014  . Gastrointestinal ulcer due to Helicobacter pylori [id number]   Needs LEEP for evaluation     Plan:     Discussed at length via interpreter the reason for LEEP for evaluation to r/o invasion. She declines to view the video. She will schedule procedure with me or Dr Elly Modena.  15 min face to face and coordination of care  Woodroe Mode, MD 12/25/2015

## 2015-12-29 ENCOUNTER — Encounter: Payer: Self-pay | Admitting: Obstetrics & Gynecology

## 2016-01-08 ENCOUNTER — Encounter: Payer: Self-pay | Admitting: Obstetrics and Gynecology

## 2016-01-08 ENCOUNTER — Other Ambulatory Visit (HOSPITAL_COMMUNITY)
Admission: RE | Admit: 2016-01-08 | Discharge: 2016-01-08 | Disposition: A | Discharge status: Home / Self Care | Payer: Self-pay | Source: Ambulatory Visit | Attending: Obstetrics and Gynecology | Admitting: Obstetrics and Gynecology

## 2016-01-08 ENCOUNTER — Ambulatory Visit (INDEPENDENT_AMBULATORY_CARE_PROVIDER_SITE_OTHER): Payer: Self-pay | Admitting: Obstetrics and Gynecology

## 2016-01-08 VITALS — BP 111/68 | HR 73 | Temp 98.3°F | Ht 62.0 in | Wt 146.0 lb

## 2016-01-08 DIAGNOSIS — D069 Carcinoma in situ of cervix, unspecified: Secondary | ICD-10-CM

## 2016-01-08 DIAGNOSIS — Z3202 Encounter for pregnancy test, result negative: Secondary | ICD-10-CM

## 2016-01-08 LAB — POCT PREGNANCY, URINE: Preg Test, Ur: NEGATIVE

## 2016-01-08 NOTE — Progress Notes (Signed)
Patient ID: Alicia Miller, female   DOB: [birth year], 40 y.o.   MRN: [id number] 40 yo here for LEEP Patient identified, informed consent obtained, signed copy in chart, time out performed.  Pap smear and colposcopy reviewed.   Pap AIS in 09/2015 Colpo Biopsy AIS 11/2015 ECC negative Teflon coated speculum with smoke evacuator placed.  Cervix visualized. Paracervical block placed.  A large size LOOP used to remove cone of cervix using blend of cut and cautery on LEEP machine.  Edges/Base cauterized with Diona Foley extensively.  Monsel's solution used for hemostasis.  Patient tolerated procedure well.  Patient given post procedure instructions.  Follow up in 6 months for repeat pap or as needed.

## 2016-01-11 ENCOUNTER — Telehealth: Payer: Self-pay

## 2016-01-11 NOTE — Telephone Encounter (Signed)
Per Dr. Elly Modena, inform pt that her LEEP specimen removed all the precancerous cells with no evidence of remaining disease.  To have pt f/u in 6 months for rpt pap smear and boost immune system with Vit C and antioxidants.  Called pt with Spanish interpreter, Roxine Caddy, and informed pt of results and recommendations from the provider.  Pt stated that she that her tongue has been numb since the procedure.  Informed pt that the lidocaine with epi may have her tongue feeling numb and that it should go away over time.  Pt stated understanding with no further questions.

## 2016-01-19 ENCOUNTER — Ambulatory Visit: Payer: Self-pay | Attending: Family Medicine | Admitting: Family Medicine

## 2016-01-19 ENCOUNTER — Encounter: Payer: Self-pay | Admitting: Family Medicine

## 2016-01-19 VITALS — BP 117/73 | HR 82 | Temp 98.5°F | Resp 16 | Ht 62.0 in | Wt 139.0 lb

## 2016-01-19 DIAGNOSIS — G510 Bell's palsy: Secondary | ICD-10-CM

## 2016-01-19 DIAGNOSIS — R52 Pain, unspecified: Secondary | ICD-10-CM | POA: Insufficient documentation

## 2016-01-19 DIAGNOSIS — K5900 Constipation, unspecified: Secondary | ICD-10-CM | POA: Insufficient documentation

## 2016-01-19 DIAGNOSIS — Z0000 Encounter for general adult medical examination without abnormal findings: Secondary | ICD-10-CM

## 2016-01-19 DIAGNOSIS — Z79899 Other long term (current) drug therapy: Secondary | ICD-10-CM | POA: Insufficient documentation

## 2016-01-19 HISTORY — DX: Bell's palsy: G51.0

## 2016-01-19 LAB — POCT GLYCOSYLATED HEMOGLOBIN (HGB A1C): Hemoglobin A1C: 5.3

## 2016-01-19 MED ORDER — PREDNISONE 20 MG PO TABS: 60.0000 mg | ORAL_TABLET | Freq: Every day | ORAL | Status: DC

## 2016-01-19 NOTE — Assessment & Plan Note (Signed)
A: history consistent with L Bell's Palsy brought on my viral illness. Suspect viral GI illness. With residual subjective slight L lower face weakness.  P: Prednisone 60 mg daily x 7 days  F/u in 2 weeks

## 2016-01-19 NOTE — Progress Notes (Signed)
Subjective:  Patient ID: Alicia Miller, female    DOB: 03/12/1976  Age: 40 y.o. MRN: [id number]  Spanish interpreter used  CC: Generalized Body Aches   HPI Elyzabeth Lawrimore presents for    1. Facial droop: symptoms started on 01/08/2016 following her LEEP procedure.. She developed a sore on her L lateral tongue, then swelling in tongue, then L lower facial droop with tearing of L eye. Her droop with associated with difficulty keeping food and water in her mouth. 4 days later, she started to feel severe aches diffusely. Aches lasted for 1.5 days. She took tylenol extra strength which helped. Her pain is almost completely resolved now. Over the last week, she also experienced some nausea,  constipation and stomach irritability and slight weakness in L face. She denies fever, chills, cough, diarrhea. She has no known sick contacts.   Social History  Substance Use Topics  . Smoking status: Never Smoker   . Smokeless tobacco: Never Used  . Alcohol Use: No    Outpatient Prescriptions Prior to Visit  Medication Sig Dispense Refill  . ibuprofen (ADVIL,MOTRIN) 600 MG tablet Take 600 mg by mouth every 6 (six) hours as needed. Reported on 01/19/2016    . Multiple Vitamins-Minerals (MULTIVITAMIN WITH MINERALS) tablet Take 1 tablet by mouth daily. Reported on 01/19/2016    . omeprazole (PRILOSEC) 40 MG capsule 40 mg. Reported on 01/19/2016  11   No facility-administered medications prior to visit.    ROS Review of Systems  Gastrointestinal: Positive for nausea and constipation.  Musculoskeletal: Positive for myalgias.  Neurological: Positive for facial asymmetry and weakness.    Objective:  BP 117/73 mmHg  Pulse 82  Temp(Src) 98.5 F (36.9 C) (Oral)  Resp 16  Ht 5\' 2"  (1.575 m)  Wt 139 lb (63.05 kg)  BMI 25.42 kg/m2  SpO2 98%  LMP 12/23/2015  BP/Weight 01/19/2016 [id number]  Systolic BP 123XX123 [id number]  Diastolic BP 73 68 79  Wt. (Lbs) 139 146 135.6  BMI 25.42 26.7  24.8    Physical Exam  Constitutional: She is oriented to person, place, and time. She appears well-developed and well-nourished. No distress.  HENT:  Head: Normocephalic and atraumatic.  Right Ear: Hearing, tympanic membrane and ear canal normal.  Left Ear: Hearing, tympanic membrane, external ear and ear canal normal.  Nose: Nose normal.  Mouth/Throat: Uvula is midline. No oral lesions. No uvula swelling.  Eyes: Conjunctivae, EOM and lids are normal. Pupils are equal, round, and reactive to light.  Cardiovascular: Normal rate, regular rhythm, normal heart sounds and intact distal pulses.   Pulmonary/Chest: Effort normal and breath sounds normal.  Musculoskeletal: She exhibits no edema.  Neurological: She is alert and oriented to person, place, and time. No cranial nerve deficit. She exhibits normal muscle tone. Coordination normal.  Skin: Skin is warm and dry. No rash noted.  Psychiatric: She has a normal mood and affect.   Lab Results  Component Value Date   HGBA1C 5.3 01/19/2016   Assessment & Plan:   Kaleea was seen today for generalized body aches.  Diagnoses and all orders for this visit:  Healthcare maintenance -     HgB A1c  Left-sided Bell's palsy -     Discontinue: predniSONE (DELTASONE) 20 MG tablet; Take 3 tablets (60 mg total) by mouth daily with breakfast. -     predniSONE (DELTASONE) 20 MG tablet; Take 3 tablets (60 mg total) by mouth daily with breakfast.    No orders of the  defined types were placed in this encounter.    Follow-up: No Follow-up on file.   Boykin Nearing MD

## 2016-01-19 NOTE — Patient Instructions (Addendum)
Alicia Miller was seen today for generalized body aches.  Diagnoses and all orders for this visit:  Healthcare maintenance -     HgB A1c  Left-sided Bell's palsy -     predniSONE (DELTASONE) 20 MG tablet; Take 3 tablets (60 mg total) by mouth daily with breakfast.    Take prednisone with food   F/u in 2 weeks for Bell's Palsy   Dr. Hermina Barters Palsy Bell palsy is a condition in which the muscles on one side of the face become paralyzed. This often causes one side of the face to droop. It is a common condition and most people recover completely. RISK FACTORS Risk factors for Bell palsy include:  Pregnancy.  Diabetes.  An infection by a virus, such as infections that cause cold sores. CAUSES  Bell palsy is caused by damage to or inflammation of a nerve in your face. It is unclear why this happens, but an infection by a virus may lead to it. Most of the time the reason it happens is unknown. SIGNS AND SYMPTOMS  Symptoms can range from mild to severe and can take place over a number of hours. Symptoms may include:  Being unable to:  Raise one or both eyebrows.  Close one or both eyes.  Feel parts of your face (facial numbness).  Drooping of the eyelid and corner of the mouth.  Weakness in the face.  Paralysis of half your face.  Loss of taste.  Sensitivity to loud noises.  Difficulty chewing.  Tearing up of the affected eye.  Dryness in the affected eye.  Drooling.  Pain behind one ear. DIAGNOSIS  Diagnosis of Bell palsy may include:  A medical history and physical exam.  An MRI.  A CT scan.  Electromyography (EMG). This is a test that checks how your nerves are working. TREATMENT  Treatment may include antiviral medicine to help shorten the length of the condition. Sometimes treatment is not needed and the symptoms go away on their own. HOME CARE INSTRUCTIONS   Take medicines only as directed by your health care provider.  Do facial massages and  exercises as directed by your health care provider.  If your eye is affected:  Use moisturizing eye drops to prevent drying of your eye as directed by your health care provider.  Protect your eye as directed by your health care provider. SEEK MEDICAL CARE IF:  Your symptoms do not get better or get worse.  You are drooling.  Your eye is red, irritated, or hurts. SEEK IMMEDIATE MEDICAL CARE IF:   Another part of your body feels weak or numb.  You have difficulty swallowing.  You have a fever along with symptoms of Bell palsy.  You develop neck pain. MAKE SURE YOU:   Understand these instructions.  Will watch your condition.  Will get help right away if you are not doing well or get worse.   This information is not intended to replace advice given to you by your health care provider. Make sure you discuss any questions you have with your health care provider.   Document Released: 11/04/2005 Document Revised: 07/26/2015 Document Reviewed: 02/11/2014 Elsevier Interactive Patient Education [employer].

## 2016-01-19 NOTE — Progress Notes (Signed)
C/C Body ache Stated had problems with mouth.  Possible bell palsy  No pain today  No tobacco user  No suicidal thought in the past two weeks

## 2016-01-26 ENCOUNTER — Ambulatory Visit: Payer: Self-pay | Admitting: Obstetrics and Gynecology

## 2016-01-29 ENCOUNTER — Ambulatory Visit: Payer: Self-pay

## 2016-02-08 ENCOUNTER — Ambulatory Visit: Payer: Self-pay | Attending: Family Medicine

## 2016-02-12 ENCOUNTER — Encounter: Payer: Self-pay | Admitting: Family Medicine

## 2016-02-12 ENCOUNTER — Encounter: Payer: Self-pay | Admitting: Clinical

## 2016-02-12 ENCOUNTER — Ambulatory Visit: Payer: Self-pay | Attending: Family Medicine | Admitting: Family Medicine

## 2016-02-12 VITALS — BP 109/71 | HR 80 | Temp 98.1°F | Resp 18 | Ht 62.0 in | Wt 138.2 lb

## 2016-02-12 DIAGNOSIS — G510 Bell's palsy: Secondary | ICD-10-CM | POA: Insufficient documentation

## 2016-02-12 DIAGNOSIS — N644 Mastodynia: Secondary | ICD-10-CM | POA: Insufficient documentation

## 2016-02-12 DIAGNOSIS — N393 Stress incontinence (female) (male): Secondary | ICD-10-CM | POA: Insufficient documentation

## 2016-02-12 DIAGNOSIS — Z79899 Other long term (current) drug therapy: Secondary | ICD-10-CM | POA: Insufficient documentation

## 2016-02-12 DIAGNOSIS — M7989 Other specified soft tissue disorders: Secondary | ICD-10-CM | POA: Insufficient documentation

## 2016-02-12 LAB — POCT URINALYSIS DIPSTICK
Bilirubin, UA: NEGATIVE
Glucose, UA: NEGATIVE
Ketones, UA: NEGATIVE
Leukocytes, UA: NEGATIVE
Nitrite, UA: NEGATIVE
Protein, UA: NEGATIVE
Spec Grav, UA: 1.025
Urobilinogen, UA: 0.2
pH, UA: 6.5

## 2016-02-12 LAB — POCT URINE PREGNANCY: Preg Test, Ur: NEGATIVE

## 2016-02-12 NOTE — Progress Notes (Signed)
Patient is here for FU Bell's Palsy  Patient complains of bilateral breast pain beginning in the right and radiating to the left. Pain has been present for the past week. Patient states pain is described as constant tender. PAIN INCREASES WITHOUT USE OF BRA. Pain is scaled currently at a 4. Pain can increase to a 10.  Patient takes Vitamin C as well.  Patient has not began the prednisone

## 2016-02-12 NOTE — Progress Notes (Signed)
Depression screen [facility] 2/9 02/12/2016 11/16/2015 10/06/2015 08/11/2015 04/06/2015  Decreased Interest 0 0 0 0 0  Down, Depressed, Hopeless 0 0 0 0 0  PHQ - 2 Score 0 0 0 0 0  Altered sleeping 1 - - - -  Tired, decreased energy 1 - - - -  Change in appetite 0 - - - -  Feeling bad or failure about yourself  0 - - - -  Trouble concentrating 0 - - - -  Moving slowly or fidgety/restless 0 - - - -  Suicidal thoughts 0 - - - -  PHQ-9 Score 2 - - - -    GAD 7 : Generalized Anxiety Score 02/12/2016  Nervous, Anxious, on Edge 1  Control/stop worrying 1  Worry too much - different things 1  Trouble relaxing 0  Restless 0  Easily annoyed or irritable 1  Afraid - awful might happen 1  Total GAD 7 Score 5

## 2016-02-12 NOTE — Assessment & Plan Note (Signed)
Breast tenderness with normal exam Reassurance Recommended repeat mammogram at age 40.  Patient would like repeat mammogram in one year from last. Discussed risk of false positive mammogram.

## 2016-02-12 NOTE — Patient Instructions (Addendum)
Alicia Miller was seen today for follow-up.  Diagnoses and all orders for this visit:  Breast tenderness in female -     POCT urine pregnancy  Stress incontinence, female -     POCT urinalysis dipstick  normal breast exam- wear a form fitting sports bra to support your breast tissue.  No swelling of hands or feet noted on today's exam  F/u in 3 months for stress incontinence   Dr. Felipa Eth [place] (Urinary Incontinence) La incontinencia urinaria es la prdida involuntaria de orina de la vejiga. CAUSAS  Hay muchas causas de incontinencia urinaria. Ellas son:  Medicamentos.  Infecciones.  Agrandamiento prosttico que causa un aumento del flujo de [place] de la vejiga.  Ciruga.  Enfermedades neurolgicas.  Factores emocionales. SIGNOS Y SNTOMAS Hay cuatro tipos de incontinencia urinaria: 1. Incontinencia urinaria de urgencia: es la prdida involuntaria de orina antes de llegar al bao. Hay una urgencia repentina de orinar pero no llega a tiempo al bao. 2. Incontinencia por estrs: es la prdida repentina de orina con cualquier actividad que fuerza a Art gallery manager orina. La causa ms frecuente son los cambios anatmicos en la pelvis y las zonas del esfnter. 3. Incontinencia por rebosamiento: es la prdida de orina por una obstruccin de la apertura de la vejiga. Esto causa un retroceso de la orina y una acumulacin de presin dentro de la vejiga. Cuando la presin dentro de la vejiga excede la presin que Engineer, manufacturing systems, la orina rebalsa y causa incontinencia, similar al desbordamiento de un dique. 4. Incontinencia total: es la prdida de orina como resultado de la incapacidad de Financial controller orina dentro de la vejiga. DIAGNSTICO  La evaluacin de la causa puede requerir:  Ardelia Mems historia mdica y obsttrica exhaustiva y [place].  Examen fsico completo.  Anlisis de laboratorio como un urocultivo y [place]. Cuando se indican estudios adicionales, pueden  incluirse:  Una ecografa.  Radiografas de vejiga y riones.  Una cistoscopa. Consiste en un examen de la vejiga utilizando un telescopio pequeo.  Estudios urodinmicos para comprobar la funcin nerviosa de la vejiga y Social worker. TRATAMIENTO  El tratamiento de la incontinencia urinaria depende de la causa:  Para la incontinencia urinaria causada por una infeccin urinaria, le indicarn antibiticos. Si la incontinencia urinaria se relaciona con los [employer] toma, el mdico podr Public relations account executive.  Para la incontinencia por estrs, en necesaria una ciruga para restablecer el soporte anatmico de la vejiga o el esfnter, o ambos, lo que Clinical research associate.  Para la incontinencia por rebosamiento causada por una prstata agrandada, una operacin para abrir el canal a travs de la prstata agrandada permitir que el flujo de orina salga de la vejiga. En las mujeres con fibromas, ser necesaria una histerectoma.  Para la incontinencia total, una ciruga del esfnter podr ayudar. Puede ser necesario colocar un esfnter urinario artificial (se coloca un manguito inflable alrededor de Geologist, engineering). En las mujeres que hayan desarrollado un pasaje similar a un orificio entre la vejiga y la vagina (fstula vesicovaginal ), ser necesario realizar una ciruga para cerrar la fstula. INSTRUCCIONES PARA EL CUIDADO EN EL HOGAR  La higiene diaria normal y el uso regular de apsitos o paales para adultos cambiados con regularidad ayudan a prevenir olores y daos en la piel.  Evite la cafena. Puede sobreestimular la vejiga.  Use el bao con regularidad. Trate de ir al bao cada 2  3 horas, aunque no sienta la necesidad. Tmese el tiempo para vaciar la vejiga completamente. Despus  de orinar, espere un minuto. Luego trate de orinar nuevamente.  Para las causas que implican una disfuncin nerviosa, lleve un registro de los medicamentos que toma y un diario de las veces que va al  bao. SOLICITE ATENCIN MDICA SI:  Despus del procedimiento, experimenta un empeoramiento del dolor en vez de mejorar.  La incontinencia empeora en vez de mejorar. SOLICITE ATENCIN MDICA DE INMEDIATO SI:  Le sube la fiebre o tiene escalofros.  No puede orinar.  Observa irritacin en la ingle o baja hacia los muslos. ASEGRESE DE QUE:   Comprende estas instrucciones.   Controlar su afeccin.  Recibir ayuda de inmediato si no mejora o si empeora.   Esta informacin no tiene Marine scientist el consejo del mdico. Asegrese de hacerle al mdico cualquier pregunta que tenga.   Document Released: 11/04/2005 Document Revised: 11/25/2014 Elsevier Interactive Patient Education 2016 [employer].   Informacin para el paciente: Ejercicios con los msculos plvicos (ejercicios de Kegel) (Conceptos Bsicos)  Qu son los ejercicios con los msculos plvicos? - Los ejercicios con los msculos plvicos fortalecen los msculos que controlan el flujo de orina y las evacuaciones. Tambin se los [employer] "ejercicios de Kegel" y pueden ayudarlo a evitar prdidas de orina, gases o evacuaciones, si tiene esos problemas. Tambin pueden ayudar con un padecimiento femenino llamado "prolapso de rgano plvico". En las mujeres que tienen prolapso de rgano plvico, los rganos de la parte inferior del estmago se caen y presionan la vagina o sobresalen por ella.   Cmo aprendo a hacer los ejercicios de Kegel? - Primero, pregntele a su mdico o enfermero cmo hacerlos correctamente. l puede ayudarla a empezar. Usted debe aprender cules son los msculos que debe Museum/gallery curator y a Clinical cytogeneticist es difcil distinguir los msculos correctos.  Alicia Miller podra aprender a hacer ejercicios de Kegel:  ?Al colocar un dedo dentro de la vagina y Museum/gallery curator los msculos que rodean el dedo, o  ?Al hacer de cuenta que est sentada sobre una canica y tiene que levantar la canica con la vagina. Un hombre podra  aprender a Therapist, art de Kegel al apretar los msculos de las nalgas como si estuviera conteniendo gases.  Tanto los hombres [employer] mujeres pueden aprender a Insurance risk surveyor de Kegel al detener e iniciar el flujo de [place]; si lo hace de [place], asegrese de [place] solo una o dos veces para distinguir los msculos correctos. Algunos mdicos piensan que esto no se debe hacer porque si se acostumbra a ello podra causar una infeccin en la vejiga.  No importa cmo aprenda a hacer los ejercicios de Kegel; lo importante es que sepa que los msculos que se usan no estn en el rea del estmago ni en los muslos.  Cuando ya sepa qu msculos contraer, puede hacer los ejercicios en cualquier posicin (sentado en una silla o acostado). No es necesario que los haga en el bao.   Con qu frecuencia debo hacer los ejercicios? - Haga los ejercicios tres veces al da, 3 o 4 das por [place]. Flexione sus msculos entre 8 y 69 veces cada vez, y Civil engineer, contracting contrados unos 6 a 8 segundos cada vez que los [place]. Siga esta rutina durante al menos 3 a 4 meses. Probablemente note los [place], pero podra tomar un poco de [place].  Cmo ayudan los ejercicios de Kegel? - Los ejercicios de Kegel pueden ayudar a: ?Reducir las prdidas de [place] en personas con "incontinencia por estrs", lo que significa que tienen prdidas de  orina cuando tosen, ren, estornudan o hacen un esfuerzo ?Controlar la necesidad sbita de Sports administrator con "incontinencia por urgencia". (La incontinencia por urgencia tambin se conoce como incontinencia por emergencia). ?Controlar la liberacin de gases o evacuaciones ?Reducir la presin o la protuberancia en la vagina causada por el prolapso de rgano plvico (si tiene una protuberancia en la vagina, consulte a su mdico o enfermero para descubrir la causa) Es posible que los ejercicios de Kegel tambin reduzcan las prdidas de [place] en hombres que se han sometido a  Leisure centre manager para Lawyer de prstata o el agrandamiento de la prstata.  Ms informacin sobre este tema

## 2016-02-12 NOTE — Progress Notes (Signed)
Subjective:  Patient ID: Alicia Miller, female    DOB: [birth year]  Age: 40 y.o. MRN: [id number]  Spanish interpreter used  CC: Follow-up   HPI Alicia Miller presents for    1. Facial droop: symptoms started on 01/08/2016 following her LEEP procedure.. She developed a sore on her L lateral tongue, then swelling in tongue, then L lower facial droop with tearing of L eye. Her droop with associated with difficulty keeping food and water in her mouth. 4 days later, she started to feel severe aches diffusely. Aches lasted for 1.5 days. She took tylenol extra strength which helped.  Since last OV, she completed prednisone. She denies facial droop, weakness, numbness.   2. Stress incontinence: x 3 years. Worsening. Leaks urine when she sneezes, coughs, does heavy lifting. No dysuria or hematuria.   3. Breast pain: x one week. Both breast. Upper breast. Soreness. Worse when she is not wearing a bra. She has R breast cyst. She had a normal mammogram in 10/2015.   4. Swelling: of hand and feet x 2 weeks. This was painless. She has hx of varicose veins that swell sometimes. No swelling now.   Social History  Substance Use Topics  . Smoking status: Never Smoker   . Smokeless tobacco: Never Used  . Alcohol Use: No    Outpatient Prescriptions Prior to Visit  Medication Sig Dispense Refill  . ibuprofen (ADVIL,MOTRIN) 600 MG tablet Take 600 mg by mouth every 6 (six) hours as needed. Reported on 01/19/2016    . Multiple Vitamins-Minerals (MULTIVITAMIN WITH MINERALS) tablet Take 1 tablet by mouth daily. Reported on 01/19/2016    . omeprazole (PRILOSEC) 40 MG capsule 40 mg. Reported on 01/19/2016  11  . predniSONE (DELTASONE) 20 MG tablet Take 3 tablets (60 mg total) by mouth daily with breakfast. 21 tablet 0   No facility-administered medications prior to visit.    ROS Review of Systems  Constitutional: Negative for fever and chills.  Eyes: Negative for visual disturbance.    Respiratory: Negative for shortness of breath.   Cardiovascular: Negative for chest pain.       B/l breast pain   Gastrointestinal: Negative for nausea, abdominal pain, constipation and blood in stool.  Musculoskeletal: Negative for myalgias, back pain and arthralgias.  Skin: Negative for rash.  Allergic/Immunologic: Negative for immunocompromised state.  Neurological: Negative for facial asymmetry and weakness.  Hematological: Negative for adenopathy. Does not bruise/bleed easily.  Psychiatric/Behavioral: Negative for suicidal ideas and dysphoric mood.    Objective:  BP 109/71 mmHg  Pulse 80  Temp(Src) 98.1 F (36.7 C) (Oral)  Resp 18  Ht 5\' 2"  (1.575 m)  Wt 138 lb 3.2 oz (62.687 kg)  BMI 25.27 kg/m2  SpO2 99%  LMP 01/23/2016  BP/Weight 02/12/2016 01/19/2016 [id number]  Systolic BP 0000000 123XX123 99991111  Diastolic BP 71 73 68  Wt. (Lbs) 138.2 139 146  BMI 25.27 25.42 26.7   Physical Exam  Constitutional: She is oriented to person, place, and time. She appears well-developed and well-nourished. No distress.  HENT:  Head: Normocephalic and atraumatic.  Nose: Nose normal.  Mouth/Throat: Uvula is midline. No oral lesions. No uvula swelling.  Eyes: Conjunctivae, EOM and lids are normal. Pupils are equal, round, and reactive to light.  Cardiovascular: Normal rate, regular rhythm, normal heart sounds and intact distal pulses.   Pulmonary/Chest: Effort normal and breath sounds normal. Right breast exhibits tenderness. Right breast exhibits no inverted nipple, no mass, no nipple discharge and no skin change.  Left breast exhibits tenderness. Left breast exhibits no inverted nipple, no mass, no nipple discharge and no skin change. Breasts are symmetrical.  Musculoskeletal: She exhibits no edema.  Neurological: She is alert and oriented to person, place, and time. No cranial nerve deficit. She exhibits normal muscle tone. Coordination normal.  Skin: Skin is warm and dry. No rash noted.   Psychiatric: She has a normal mood and affect.   U preg: negative UA: neg except trace RBCs Lab Results  Component Value Date   HGBA1C 5.3 01/19/2016   Depression screen [facility] 2/9 02/12/2016 11/16/2015 10/06/2015  Decreased Interest 0 0 0  Down, Depressed, Hopeless 0 0 0  PHQ - 2 Score 0 0 0  Altered sleeping 1 - -  Tired, decreased energy 1 - -  Change in appetite 0 - -  Feeling bad or failure about yourself  0 - -  Trouble concentrating 0 - -  Moving slowly or fidgety/restless 0 - -  Suicidal thoughts 0 - -  PHQ-9 Score 2 - -    Assessment & Plan:   Kaylena was seen today for follow-up.  Diagnoses and all orders for this visit:  Breast tenderness in female -     POCT urine pregnancy  Stress incontinence, female -     POCT urinalysis dipstick    No orders of the defined types were placed in this encounter.    Follow-up: No Follow-up on file.   Alicia Nearing MD

## 2016-02-12 NOTE — Assessment & Plan Note (Signed)
A; stress incontinence P: kegels

## 2016-02-12 NOTE — Assessment & Plan Note (Signed)
Resolved with no residual deficit

## 2016-07-11 ENCOUNTER — Ambulatory Visit (HOSPITAL_COMMUNITY): Payer: Self-pay

## 2016-07-25 ENCOUNTER — Encounter (HOSPITAL_COMMUNITY): Payer: Self-pay

## 2016-07-25 ENCOUNTER — Ambulatory Visit (HOSPITAL_COMMUNITY)
Admission: RE | Admit: 2016-07-25 | Discharge: 2016-07-25 | Disposition: A | Discharge status: Home / Self Care | Payer: Self-pay | Source: Ambulatory Visit | Attending: Obstetrics and Gynecology | Admitting: Obstetrics and Gynecology

## 2016-07-25 VITALS — BP 120/72 | Temp 98.2°F | Ht 63.0 in | Wt 142.6 lb

## 2016-07-25 DIAGNOSIS — Z01419 Encounter for gynecological examination (general) (routine) without abnormal findings: Secondary | ICD-10-CM

## 2016-07-25 NOTE — Addendum Note (Signed)
Encounter addended by: Armond Hang, LPN on: [id number] AM<BR>    Actions taken: Order Entry activity accessed

## 2016-07-25 NOTE — Patient Instructions (Signed)
Explained to Alicia Miller that her next Pap smear will be due in 6 months and to call Sabrina to schedule with [facility]. Informed patient that it is recommended to start getting mammograms at age 40 and she will be due next October 2018. Let patient know will follow up with her within the next couple weeks with results of Pap smear by phone. Alicia Miller verbalized understanding.  Mehkai Gallo, [name], RN 10:35 AM

## 2016-07-25 NOTE — Progress Notes (Signed)
No complaints today. Patient came to [facility] today for her 6 month follow up Pap smear.  Pap Smear: Pap smear completed today. Last Pap smear was 10/06/2015 at [facility] and showed endocervical adenocarcinoma in situ. A CKC was completed for follow up 12/01/2015. Patient also has a history of a Pap smear 10/06/2014 that was normal and positive for HPV. A colposcopy was completed to follow up for prior Pap smear 11/03/2014. The above results are in EPIC.  Pelvic/Bimanual   Ext Genitalia No lesions, no swelling and no discharge observed on external genitalia.         Vagina Vagina pink and normal texture. No lesions or discharge observed in vagina.          Cervix Cervix is present. Cervix pink and of normal texture. No discharge observed.     Uterus Uterus is present and palpable. Uterus in normal position and normal size.        Adnexae Bilateral ovaries present and palpable. No tenderness on palpation.          Rectovaginal No rectal exam completed today since patient had no rectal complaints. No skin abnormalities observed on exam.    Smoking History: Patient has never smoked.  Patient Navigation: Patient education provided. Access to services provided for patient through [facility] program. Spanish interpreter provided.  Used Spanish interpreter [employer] from [place].

## 2016-07-26 ENCOUNTER — Encounter (HOSPITAL_COMMUNITY): Payer: Self-pay | Admitting: *Deleted

## 2016-07-26 LAB — CYTOLOGY - PAP

## 2016-07-29 ENCOUNTER — Telehealth (HOSPITAL_COMMUNITY): Payer: Self-pay | Admitting: *Deleted

## 2016-07-29 NOTE — Telephone Encounter (Signed)
Telephoned patient at home and discussed negative pap smear results. Advised patient HPV was negative. Next pap smear due in six months. Patient voiced understanding. Used interpreter Lavon Paganini.

## 2016-08-06 MED FILL — OMEPRAZOLE DR 40 MG CAPSULE: 40 | 90 days supply | Qty: 90 | Fill #1

## 2016-08-15 ENCOUNTER — Ambulatory Visit
Admission: RE | Admit: 2016-08-15 | Discharge: 2016-08-15 | Disposition: A | Discharge status: Home / Self Care | Payer: Self-pay | Source: Ambulatory Visit | Attending: Family Medicine | Admitting: Family Medicine

## 2016-08-15 ENCOUNTER — Encounter: Payer: Self-pay | Admitting: Family Medicine

## 2016-08-15 ENCOUNTER — Ambulatory Visit: Payer: Self-pay | Attending: Family Medicine | Admitting: Family Medicine

## 2016-08-15 VITALS — BP 116/77 | HR 76 | Temp 98.0°F | Ht 62.0 in | Wt 142.0 lb

## 2016-08-15 DIAGNOSIS — M25571 Pain in right ankle and joints of right foot: Secondary | ICD-10-CM

## 2016-08-15 DIAGNOSIS — M25572 Pain in left ankle and joints of left foot: Principal | ICD-10-CM

## 2016-08-15 DIAGNOSIS — M25262 Flail joint, left knee: Secondary | ICD-10-CM | POA: Insufficient documentation

## 2016-08-15 DIAGNOSIS — Z23 Encounter for immunization: Secondary | ICD-10-CM

## 2016-08-15 DIAGNOSIS — G8929 Other chronic pain: Secondary | ICD-10-CM

## 2016-08-15 DIAGNOSIS — M2391 Unspecified internal derangement of right knee: Secondary | ICD-10-CM

## 2016-08-15 DIAGNOSIS — M238X1 Other internal derangements of right knee: Secondary | ICD-10-CM

## 2016-08-15 DIAGNOSIS — E559 Vitamin D deficiency, unspecified: Secondary | ICD-10-CM

## 2016-08-15 DIAGNOSIS — R5383 Other fatigue: Secondary | ICD-10-CM

## 2016-08-15 DIAGNOSIS — Z79899 Other long term (current) drug therapy: Secondary | ICD-10-CM | POA: Insufficient documentation

## 2016-08-15 LAB — CBC
HCT: 42.3 % (ref 35.0–45.0)
Hemoglobin: 14.8 g/dL (ref 11.7–15.5)
MCH: 28.8 pg (ref 27.0–33.0)
MCHC: 35 g/dL (ref 32.0–36.0)
MCV: 82.3 fL (ref 80.0–100.0)
MPV: 9.5 fL (ref 7.5–12.5)
Platelets: 376 10*3/uL (ref 140–400)
RBC: 5.14 MIL/uL — ABNORMAL HIGH (ref 3.80–5.10)
RDW: 13.3 % (ref 11.0–15.0)
WBC: 7.8 10*3/uL (ref 3.8–10.8)

## 2016-08-15 LAB — TSH: TSH: 2.65 mIU/L

## 2016-08-15 MED ORDER — NAPROXEN 500 MG PO TABS: 500.0000 mg | ORAL_TABLET | Freq: Two times a day (BID) | ORAL | 0 refills | Status: DC

## 2016-08-15 MED FILL — NAPROXEN 500 MG TABLET: 500 | 5 days supply | Qty: 10 | Fill #0

## 2016-08-15 NOTE — Progress Notes (Signed)
Subjective:  Patient ID: Alicia Miller, female    DOB: 1976/05/17  Age: 40 y.o. MRN: [id number] [facility] spanish interpreter used  CC: Ankle Pain   HPI Tylisha Spikes has hx of adenocarcinoma in situ of cervix, recent pap negative 07/25/2016, she presents for   1. Ankle pain: bilateral. Worse on R. She injured her ankles 8-10 years ago. She was jumping from a height and injured both ankles. At the time she has swelling and pain in her ankles. Now she has pain and brittle feeling in both ankles particularly during cold weather. One week ago she worse a pair of wedge heels and her ankles became painful. She also has pain when she wears flats. She denies foot and dorsal heel pina. She gets pain in lateral ankles with swelling. No skin discoloration.   Previous treatment with topical pain relief ointments and wearing compression socks.   2. R knee laxity: also for past 8-10 years. Have feeling of joint laxity and crepitus. Also joint swelling. She denies knee pain.  She has tried some home strengthening exercises in the past that caused knee pain.   3. Feel tired: started one week ago. Has intermittent nausea and dizziness as well. No recent illness. Denies cold symptoms. Had a recent period. Menses are not heavy. Denies blood in stool. Feels tired today and a little dizzy. Admits to depressed mood some days.   Social History  Substance Use Topics  . Smoking status: Never Smoker  . Smokeless tobacco: Never Used  . Alcohol use No    Outpatient Medications Prior to Visit  Medication Sig Dispense Refill  . ibuprofen (ADVIL,MOTRIN) 600 MG tablet Take 600 mg by mouth every 6 (six) hours as needed. Reported on 01/19/2016    . Multiple Vitamins-Minerals (MULTIVITAMIN WITH MINERALS) tablet Take 1 tablet by mouth daily. Reported on 01/19/2016    . omeprazole (PRILOSEC) 40 MG capsule 40 mg. Reported on 02/12/2016  11   No facility-administered medications prior to visit.      ROS Review of Systems  Constitutional: Positive for fatigue. Negative for chills and fever.  Eyes: Negative for visual disturbance.  Respiratory: Negative for shortness of breath.   Cardiovascular: Negative for chest pain.  Gastrointestinal: Negative for abdominal pain and blood in stool.  Musculoskeletal: Positive for arthralgias. Negative for back pain.  Skin: Negative for rash.  Allergic/Immunologic: Negative for immunocompromised state.  Hematological: Negative for adenopathy. Does not bruise/bleed easily.  Psychiatric/Behavioral: Positive for dysphoric mood. Negative for suicidal ideas.    Objective:  BP 116/77 (BP Location: Left Arm, Patient Position: Sitting, Cuff Size: Small)   Pulse 76   Temp 98 F (36.7 C) (Oral)   Ht 5\' 2"  (1.575 m)   Wt 142 lb (64.4 kg)   LMP 08/11/2016   SpO2 98%   BMI 25.97 kg/m   BP/Weight 08/15/2016 07/25/2016 0000000  Systolic BP 99991111 123456 0000000  Diastolic BP 77 72 71  Wt. (Lbs) 142 142.6 138.2  BMI 25.97 25.26 25.27   Physical Exam  Constitutional: She is oriented to person, place, and time. She appears well-developed and well-nourished. No distress.  HENT:  Head: Normocephalic and atraumatic.  Cardiovascular: Normal rate, regular rhythm, normal heart sounds and intact distal pulses.   Pulmonary/Chest: Effort normal and breath sounds normal.  Musculoskeletal: She exhibits no edema.       Right knee: Normal.       Right ankle: She exhibits swelling.       Left ankle: She  exhibits swelling.       Feet:  Crepitus No joint swelling   Neurological: She is alert and oriented to person, place, and time.  Skin: Skin is warm and dry. No rash noted.  Psychiatric: She has a normal mood and affect.    Depression screen [facility] 2/9 08/15/2016 02/12/2016 11/16/2015  Decreased Interest 1 0 0  Down, Depressed, Hopeless 1 0 0  PHQ - 2 Score 2 0 0  Altered sleeping 0 1 -  Tired, decreased energy 1 1 -  Change in appetite 0 0 -  Feeling bad or  failure about yourself  0 0 -  Trouble concentrating 0 0 -  Moving slowly or fidgety/restless 0 0 -  Suicidal thoughts 0 0 -  PHQ-9 Score 3 2 -   GAD 7 : Generalized Anxiety Score 08/15/2016 02/12/2016  Nervous, Anxious, on Edge 1 1  Control/stop worrying 0 1  Worry too much - different things 0 1  Trouble relaxing 0 0  Restless 0 0  Easily annoyed or irritable 0 1  Afraid - awful might happen 0 1  Total GAD 7 Score 1 5     Assessment & Plan:  Garla was seen today for ankle pain.  Diagnoses and all orders for this visit:  Chronic pain of left ankle -     DG Ankle Complete Left; Future -     Ambulatory referral to Sports Medicine -     naproxen (NAPROSYN) 500 MG tablet; Take 1 tablet (500 mg total) by mouth 2 (two) times daily with a meal. For next 5 days  Chronic pain of right ankle -     DG Ankle Complete Right; Future -     Ambulatory referral to Sports Medicine -     naproxen (NAPROSYN) 500 MG tablet; Take 1 tablet (500 mg total) by mouth 2 (two) times daily with a meal. For next 5 days  Joint laxity of right knee -     Ambulatory referral to Sports Medicine -     naproxen (NAPROSYN) 500 MG tablet; Take 1 tablet (500 mg total) by mouth 2 (two) times daily with a meal. For next 5 days  Other fatigue -     CBC -     Vitamin D, 25-hydroxy -     TSH  Encounter for immunization -     Flu Vaccine QUAD 36+ mos IM   There are no diagnoses linked to this encounter.  No orders of the defined types were placed in this encounter.   Follow-up: Return in about 4 weeks (around 09/12/2016) for ankles pains, knee laxity, fatigue .   Boykin Nearing MD

## 2016-08-15 NOTE — Assessment & Plan Note (Addendum)
Fatigue in well appearing female Normal exam  Plan: CBC, Vit D, TSH check  Consider depression although PHQ score is low overall, she may benefit from SNRI

## 2016-08-15 NOTE — Assessment & Plan Note (Signed)
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

## 2016-08-15 NOTE — Assessment & Plan Note (Signed)
This is chronic and exacerbated by cold weather. Suspect arthritis from remote trauma.  Plan: NSAID Sports medicine referral for soft tissue evaluation

## 2016-08-15 NOTE — Progress Notes (Signed)
Pt is having ankle pain  Pt has been feeling tired for 1 week  Pt is getting flu shot.

## 2016-08-15 NOTE — Patient Instructions (Addendum)
Adina was seen today for ankle pain.  Diagnoses and all orders for this visit:  Chronic pain of left ankle -     DG Ankle Complete Left; Future -     Ambulatory referral to Sports Medicine -     naproxen (NAPROSYN) 500 MG tablet; Take 1 tablet (500 mg total) by mouth 2 (two) times daily with a meal. For next 5 days  Chronic pain of right ankle -     DG Ankle Complete Right; Future -     Ambulatory referral to Sports Medicine -     naproxen (NAPROSYN) 500 MG tablet; Take 1 tablet (500 mg total) by mouth 2 (two) times daily with a meal. For next 5 days  Joint laxity of right knee -     Ambulatory referral to Sports Medicine -     naproxen (NAPROSYN) 500 MG tablet; Take 1 tablet (500 mg total) by mouth 2 (two) times daily with a meal. For next 5 days  Other fatigue -     CBC -     Vitamin D, 25-hydroxy -     TSH   F/u in 4 weeks for fatigue, ankle pains and R knee laxity  Dr. Adrian Blackwater

## 2016-08-16 DIAGNOSIS — E559 Vitamin D deficiency, unspecified: Secondary | ICD-10-CM | POA: Insufficient documentation

## 2016-08-16 LAB — VITAMIN D 25 HYDROXY (VIT D DEFICIENCY, FRACTURES): Vit D, 25-Hydroxy: 26 ng/mL — ABNORMAL LOW (ref 30–100)

## 2016-08-16 MED ORDER — VITAMIN D (ERGOCALCIFEROL) 1.25 MG (50000 UNIT) PO CAPS: 50000.0000 [IU] | ORAL_CAPSULE | ORAL | 0 refills | Status: DC

## 2016-08-16 NOTE — Addendum Note (Signed)
Addended by: Boykin Nearing on: 08/16/2016 01:11 PM   Modules accepted: Orders

## 2016-08-19 MED FILL — VIT D2 1.25 MG (50,000 UNIT: 1.25 MG | 90 days supply | Qty: 3 | Fill #0

## 2016-08-21 ENCOUNTER — Ambulatory Visit: Payer: Self-pay | Admitting: Student

## 2016-08-21 ENCOUNTER — Encounter: Payer: Self-pay | Admitting: Sports Medicine

## 2016-08-21 ENCOUNTER — Ambulatory Visit
Admission: RE | Admit: 2016-08-21 | Discharge: 2016-08-21 | Disposition: A | Discharge status: Home / Self Care | Payer: Self-pay | Source: Ambulatory Visit | Attending: Sports Medicine | Admitting: Sports Medicine

## 2016-08-21 ENCOUNTER — Ambulatory Visit (INDEPENDENT_AMBULATORY_CARE_PROVIDER_SITE_OTHER): Payer: Self-pay | Admitting: Sports Medicine

## 2016-08-21 ENCOUNTER — Other Ambulatory Visit: Payer: Self-pay | Admitting: Sports Medicine

## 2016-08-21 ENCOUNTER — Telehealth: Payer: Self-pay

## 2016-08-21 VITALS — BP 110/77 | HR 70 | Ht 62.0 in | Wt 142.0 lb

## 2016-08-21 DIAGNOSIS — M238X1 Other internal derangements of right knee: Secondary | ICD-10-CM

## 2016-08-21 DIAGNOSIS — M25579 Pain in unspecified ankle and joints of unspecified foot: Secondary | ICD-10-CM

## 2016-08-21 MED ORDER — NAPROXEN 500 MG PO TABS: ORAL_TABLET | ORAL | 0 refills | Status: DC

## 2016-08-21 MED FILL — NAPROXEN 500 MG TABLET: 500 | 30 days supply | Qty: 60 | Fill #0

## 2016-08-21 NOTE — Telephone Encounter (Signed)
Pt was called on 10/04 and informed of xray results and lab results. Pt is aware of VIT D levels and medication being sent to her pharmacy.

## 2016-08-21 NOTE — Progress Notes (Signed)
   Subjective:    Patient ID: Alicia Miller, female    DOB: 11/27/1975, 40 y.o.   MRN: [id number]  HPI chief complaint: Right ankle and right knee pain  40 year old female comes in today with a couple of different complaints. She is complaining of lateral right ankle pain which has been present for several years. She injured her ankle a few years ago. In fact, she has injured both ankles. In addition to pain she does get some instability from time to time. She has noticed swelling along the lateral aspect of the right ankle as well. She saw her primary care physician and was placed on naproxen sodium which has been helpful. She also has a brace which she bought at a [employer]. She denies any previous surgeries to either her feet or ankles. No associated numbness or tingling.  In regards to the right knee, she has chronic right knee pain and has noticed increasing valgus especially when walking. She has not noticed any swelling. Some mild instability. No mechanical symptoms. No prior knee surgery. No groin pain.  Please note that the entire history is obtained with the help of an interpreter.  Past medical history reviewed Medications reviewed Allergies reviewed    Review of Systems As above    Objective:   Physical Exam  Well-developed, well-nourished. No acute distress. Awake alert and oriented 3. Vital signs reviewed  Right knee: Full range of motion. No effusion. Mild valgus deformity with standing. Knee is stable to ligamentous exam. Negative McMurray's.  Examination of both ankles shows full range of motion. No effusion. Negative anterior drawer but a 2+ talar tilt bilaterally. There is swelling along the posterior lateral malleolus on the right along the course of the peroneal tendon. Patient has reproducible pain with peroneal tendon stretching. She is tender to palpation in this area as well. No other tenderness to palpation throughout the foot or ankle bilaterally.  Neurovascularly intact distally. Fairly well-preserved arches. Walks without a limp.  X-rays of both ankles from 08/15/2016 are reviewed. No significant degenerative changes are seen. Nothing acute. X-rays of the right knee including a standing AP, lateral, and sunrise view are unremarkable. I do not see any significant narrowing of the lateral compartment with standing. No significant degenerative changes.      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 The patient is wearing unsupportive shoe wear today. I've asked that she return to the office in a month with better shoes. We may try a green sports insole or possibly a scaphoid pad. I've also asked her to bring in her ankle brace. It may need to be replaced. She may continue on her naproxen sodium as needed. Please note, x-rays of her knee were reviewed after she left the office so I will plan on reviewing these with her when she returns for her follow-up visit.

## 2016-08-21 NOTE — Patient Instructions (Signed)
  We are going to set up some physical therapy for your ankles  I would like for you to get an x-ray of your right knee  I want to see you again in 4 weeks. When you come back, bringing a good pair of walking shoes and-year-old ankle brace.  We also refilled your anti-inflammatory medicine

## 2016-08-23 ENCOUNTER — Ambulatory Visit: Payer: Self-pay | Attending: Family Medicine

## 2016-08-26 ENCOUNTER — Ambulatory Visit: Payer: Self-pay | Attending: Sports Medicine | Admitting: Physical Therapy

## 2016-08-26 ENCOUNTER — Encounter: Payer: Self-pay | Admitting: Physical Therapy

## 2016-08-26 DIAGNOSIS — M25571 Pain in right ankle and joints of right foot: Secondary | ICD-10-CM | POA: Insufficient documentation

## 2016-08-26 DIAGNOSIS — M25572 Pain in left ankle and joints of left foot: Secondary | ICD-10-CM | POA: Insufficient documentation

## 2016-08-26 NOTE — Therapy (Signed)
[place], [place], [address] Phone: [phone]   Fax:  [phone]  Physical Therapy Evaluation  Patient Details  Name: Alicia Miller MRN: [id number] Date of Birth: Dec 24, 1975 Referring Provider: Thurman Coyer, DO  Encounter Date: 08/26/2016      PT End of Session - 08/26/16 0938    Visit Number 1   Number of Visits 9   Date for PT Re-Evaluation 09/27/16   Authorization Type CAFA exp 11/23/16   PT Start Time 0930   PT Stop Time 1015   PT Time Calculation (min) 45 min   Activity Tolerance Patient tolerated treatment well   Behavior During Therapy [facility] for tasks assessed/performed      Past Medical History:  Diagnosis Date  . 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 Cancer (HCC)    Uterine  . Depression   . Left-sided Bell's palsy 01/19/2016  . No pertinent past medical history     Past Surgical History:  Procedure Laterality Date  . CHOLECYSTECTOMY  05/2015  . NO PAST SURGERIES      There were no vitals filed for this visit.       Subjective Assessment - 08/26/16 0941    Subjective 8-10 years ago jumpted from height approx 4 ft and landed with bilateral ankles supinating and had severe swelling for about a week. Gilford Rile are worse. 3 weeks ago began noticing R ankle. Was walking and felt like ankle was going to roll. C/o valgus in R knee that has some pain wit exercises. Also c/o LBP.    How long can you walk comfortably? 1 hour   Patient Stated Goals decrease pain, knee pain with steps/stairs   Currently in Pain? Yes   Pain Score 5    Pain Location Ankle   Pain Orientation Right;Left   Pain Descriptors / Indicators --  breaking bone   Aggravating Factors  walking   Pain Relieving Factors rest   Multiple Pain Sites Yes   Pain Score 4   Pain Location Knee   Pain Orientation Right   Aggravating Factors  walking, stiars/steps   Pain  Relieving Factors rest            [facility] PT Assessment - 08/26/16 0001      Assessment   Medical Diagnosis bilateral ankle instability and peroneal tendonitis   Referring Provider Thurman Coyer, DO   Next MD Visit nov 1   Prior Therapy no     Precautions   Precautions None     Restrictions   Weight Bearing Restrictions No     Balance Screen   Has the patient fallen in the past 6 months No     [place] residence   Living Arrangements Non-relatives/Friends   Additional Comments no stairs     Cognition   Overall Cognitive Status Within Functional Limits for tasks assessed     Observation/Other Assessments   Focus on Therapeutic Outcomes (FOTO)  53% ability     Sensation   Additional Comments WFL     Posture/Postural Control   Posture Comments resting OKC excessive eversion     ROM / Strength   AROM / PROM / Strength AROM;Strength     AROM   AROM Assessment Site Ankle   Right/Left Ankle Right;Left   Right Ankle Dorsiflexion 0   Left Ankle Dorsiflexion 0     Strength  Strength Assessment Site Ankle;Hip;Knee   Right/Left Hip Right;Left   Right Hip Extension 3+/5   Right Hip ABduction 3+/5   Left Hip Extension 3+/5   Left Hip ABduction 3+/5   Right/Left Knee Right;Left   Right Knee Flexion 3+/5   Right Knee Extension 4-/5   Left Knee Flexion 3+/5   Right/Left Ankle Right;Left   Right Ankle Dorsiflexion 4/5   Right Ankle Eversion 4/5   Left Ankle Dorsiflexion 4-/5   Left Ankle Inversion 4+/5   Left Ankle Eversion 4/5     Palpation   Palpation comment denies TTP at ankles or knee                   [facility] Adult PT Treatment/Exercise - 08/26/16 0001      Exercises   Exercises Knee/Hip;Ankle     Knee/Hip Exercises: Sidelying   Clams x20 each     Ankle Exercises: Stretches   Gastroc Stretch Limitations long sitting with strap x30s each     Ankle Exercises: Supine   T-Band long sitting eversion  yellow tband x20 each                PT Education - 08/26/16 0939    Education provided Yes   Education Details anatomy of condition, POC, HEP, exercise form/rationale   Person(s) Educated Patient  via interpreter   Methods Explanation;Demonstration;Tactile cues;Verbal cues;Handout   Comprehension Verbalized understanding;Returned demonstration;Verbal cues required;Tactile cues required;Need further instruction          PT Short Term Goals - 08/26/16 1320      PT SHORT TERM GOAL #1   Title Pt will demo at least 4/5 in all LE MMT to indicate improvement in necessary muscular stability to LE biomechanical chain by 11/10   Baseline see flowsheet   Time 4   Period Weeks   Status New     PT SHORT TERM GOAL #2   Title FOTO to 73% ability to indicate significant improvement in functional ability   Baseline 53% ability at eval   Time 4   Period Weeks   Status New     PT SHORT TERM GOAL #3   Title Pt will verbalize comfort and understanding with independent program at gym   Baseline began establishing at eval   Time 4   Period Weeks   Status New     PT SHORT TERM GOAL #4   Title Pt will demo at least 5 deg DF bilaterally to improve appropriate biomechanical reactions to gait   Baseline 0 bilaterally at eval   Time 4   Period Weeks   Status New                  Plan - 08/26/16 1312    Clinical Impression Statement Pt presents to PT with complaints of bilateral ankle pain as well as R knee pain. Approx 10 years ago pt experienced what sounds like bilateral grade 3 ankle sprains which resulted in excessive stretch and ankle instability. Compensation patterns include gastroc/soleus tightness limiting DF as well as bilateral peroneal tendonitis due to overuse with excessive supination. pt also demo poor proximal strength that will benefit from challenges. Pt will benefit from skilled PT in order to improve proximal and distal strength and stability to transiiton  into independent exercise program.    Rehab Potential Good   PT Frequency 2x / week   PT Duration 4 weeks   PT Treatment/Interventions ADLs/Self Care Home Management;Cryotherapy;Electrical Stimulation;Iontophoresis 4mg /ml Dexamethasone;Functional  mobility training;Stair training;Gait training;Ultrasound;Traction;Moist Heat;Therapeutic activities;Therapeutic exercise;Balance training;Neuromuscular re-education;Patient/family education;Passive range of motion;Manual techniques;Dry needling;Taping;Vasopneumatic Device   PT Next Visit Plan hip strengthening, ankle stability   PT Home Exercise Plan resisted eversion, clams, long sitting gastroc & HS stretch   Consulted and Agree with Plan of Care Patient  via interpreter      Patient will benefit from skilled therapeutic intervention in order to improve the following deficits and impairments:  Pain, Postural dysfunction, Decreased strength, Impaired flexibility, Decreased activity tolerance, Difficulty walking  Visit Diagnosis: Pain in right ankle and joints of right foot - Plan: PT plan of care cert/re-cert  Pain in left ankle and joints of left foot - Plan: PT plan of care cert/re-cert     Problem List Patient Active Problem List   Diagnosis Date Noted  . Vitamin D insufficiency 08/16/2016  . Joint laxity of right knee 08/15/2016  . Chronic pain of left ankle 08/15/2016  . Chronic pain of right ankle 08/15/2016  . Other fatigue 08/15/2016  . Stress incontinence, female 02/12/2016  . Breast tenderness in female 02/12/2016  . 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 Cervical high risk HPV (human papillomavirus) test positive 10/06/2015  . Microscopic hematuria 08/11/2015  . Allergic rhinitis 08/11/2015  . Snoring 08/11/2015  . Laryngopharyngeal reflux 08/11/2015  . Biliary calculi 06/13/2015  . Back muscle spasm 04/22/2014    Lexus Barletta C. Yevette Knust PT, DPT 08/26/16 1:25 PM   [facility] [address] [place], [place], [address] Phone: [phone]   Fax:  [phone]  Name: Alicia Miller MRN: [id number] Date of Birth: [birth year]

## 2016-08-29 ENCOUNTER — Ambulatory Visit: Payer: Self-pay | Admitting: Physical Therapy

## 2016-09-02 ENCOUNTER — Ambulatory Visit: Payer: Self-pay | Admitting: Physical Therapy

## 2016-09-02 DIAGNOSIS — M25572 Pain in left ankle and joints of left foot: Secondary | ICD-10-CM

## 2016-09-02 DIAGNOSIS — M25571 Pain in right ankle and joints of right foot: Secondary | ICD-10-CM

## 2016-09-02 NOTE — Therapy (Signed)
[place], [place], [address] Phone: [phone]   Fax:  [phone]  Physical Therapy Treatment  Patient Details  Name: Alicia Miller MRN: [id number] Date of Birth: [birth year] Referring Provider: Thurman Coyer, DO  Encounter Date: 09/02/2016      PT End of Session - 09/02/16 0855    Visit Number 2   Number of Visits 9   Date for PT Re-Evaluation 09/27/16   Authorization Type CAFA exp 11/23/16   PT Start Time 0849   PT Stop Time 0927   PT Time Calculation (min) 38 min      Past Medical History:  Diagnosis Date  . 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 Cancer (HCC)    Uterine  . Depression   . Left-sided Bell's palsy 01/19/2016  . No pertinent past medical history     Past Surgical History:  Procedure Laterality Date  . CHOLECYSTECTOMY  05/2015  . NO PAST SURGERIES      There were no vitals filed for this visit.      Subjective Assessment - 09/02/16 0851    Subjective I am doing the exercises, no pain today   Patient is accompained by: Interpreter  [employer] interpreter [name]    Currently in Pain? No/denies                         [facility] Adult PT Treatment/Exercise - 09/02/16 0001      Knee/Hip Exercises: Aerobic   Nustep L4 UE/LE x 5 minutes   cues for knee alignment      Knee/Hip Exercises: Supine   Bridges Limitations 5 sec x 20      Knee/Hip Exercises: Sidelying   Clams x20 each     Ankle Exercises: Stretches   Gastroc Stretch Limitations long sitting with strap x30s each   Slant Board Stretch 1 rep;60 seconds     Ankle Exercises: Supine   T-Band long sitting eversion yellow tband x20 each     Ankle Exercises: Standing   Heel Raises 20 reps   Toe Raise 20 reps                PT Education - 09/02/16 [phone]    Education provided Yes   Education Details HEP   Person(s) Educated Patient    Methods Explanation;Handout   Comprehension Verbalized understanding          PT Short Term Goals - 08/26/16 1320      PT SHORT TERM GOAL #1   Title Pt will demo at least 4/5 in all LE MMT to indicate improvement in necessary muscular stability to LE biomechanical chain by 11/10   Baseline see flowsheet   Time 4   Period Weeks   Status New     PT SHORT TERM GOAL #2   Title FOTO to 73% ability to indicate significant improvement in functional ability   Baseline 53% ability at eval   Time 4   Period Weeks   Status New     PT SHORT TERM GOAL #3   Title Pt will verbalize comfort and understanding with independent program at gym   Baseline began establishing at eval   Time 4   Period Weeks   Status New     PT SHORT TERM GOAL #4   Title Pt will demo at least 5 deg DF bilaterally to improve appropriate  biomechanical reactions to gait   Baseline 0 bilaterally at eval   Time 4   Period Weeks   Status New                  Plan - 09/02/16 0900    Clinical Impression Statement Pt demonstrates independence with initial HEP. Added to HEP per PT POC and included additional hip strength and ankle stability exercises without increased pain. With slant board stretch pt c/o Mid back pain. She reports she has chronic back pain. After Nustep Pt c/o leg fatigue.    PT Next Visit Plan hip strengthening, ankle stability, review HEP   PT Home Exercise Plan resisted eversion, clams, long sitting gastroc & HS stretch, Bridge, Hip abduction, heel and toe raises   Consulted and Agree with Plan of Care Patient      Patient will benefit from skilled therapeutic intervention in order to improve the following deficits and impairments:  Pain, Postural dysfunction, Decreased strength, Impaired flexibility, Decreased activity tolerance, Difficulty walking  Visit Diagnosis: Pain in right ankle and joints of right foot  Pain in left ankle and joints of left foot     Problem  List Patient Active Problem List   Diagnosis Date Noted  . Vitamin D insufficiency 08/16/2016  . Joint laxity of right knee 08/15/2016  . Chronic pain of left ankle 08/15/2016  . Chronic pain of right ankle 08/15/2016  . Other fatigue 08/15/2016  . Stress incontinence, female 02/12/2016  . Breast tenderness in female 02/12/2016  . 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 Cervical high risk HPV (human papillomavirus) test positive 10/06/2015  . Microscopic hematuria 08/11/2015  . Allergic rhinitis 08/11/2015  . Snoring 08/11/2015  . Laryngopharyngeal reflux 08/11/2015  . Biliary calculi 06/13/2015  . Back muscle spasm 04/22/2014    Dorene Ar, [place] 09/02/2016, 9:30 AM  [place], [place], [address] Phone: [phone]   Fax:  [phone]  Name: Alicia Miller MRN: [id number] Date of Birth: 1976/05/06

## 2016-09-02 NOTE — Patient Instructions (Signed)
Bridge    [place], piernas dobladas. Inspire presionando con las caderas [place]. [place] [employer], extienda la espalda inferior. Espire rodando [employer] columna desde [place]. Repita  20___ veces. Haga __2_ sesiones diarias.  Side Leg Raise (Side-Lying)    [place] de lado con la pierna de apoyo doblada a 90. [place] la otra pierna guiado por el taln. Mantenga la pierna extendida. Sostenga la posicin por cuenta de 1. Regrese la pierna a la posicin inicial. Repita _20__ veces, con cada pierna.  Heel Raise: Bilateral (Standing)    Parece sobre las puntas de los pies. Repita __20__ Elberta [place]. Realice [id number] [place] por sesin. Realice [id number] sesiones por da.  Toe Raise (Standing)    [place] la punta de los pies hasta quedar apoyado [employer]. Repita __20__ Elberta [place]wood. Realice [id number] Albertina [place] por sesin. Realice [id number] sesiones por da.     Marland Kitchen

## 2016-09-04 ENCOUNTER — Ambulatory Visit: Payer: Self-pay | Admitting: Physical Therapy

## 2016-09-04 ENCOUNTER — Encounter: Payer: Self-pay | Admitting: Physical Therapy

## 2016-09-04 ENCOUNTER — Telehealth: Payer: Self-pay | Admitting: Family Medicine

## 2016-09-04 DIAGNOSIS — M25572 Pain in left ankle and joints of left foot: Secondary | ICD-10-CM

## 2016-09-04 DIAGNOSIS — K0889 Other specified disorders of teeth and supporting structures: Secondary | ICD-10-CM

## 2016-09-04 DIAGNOSIS — M25571 Pain in right ankle and joints of right foot: Secondary | ICD-10-CM

## 2016-09-04 NOTE — Telephone Encounter (Signed)
Please call patient for more information

## 2016-09-04 NOTE — Telephone Encounter (Signed)
Will forward to pcp

## 2016-09-04 NOTE — Telephone Encounter (Signed)
Patient would like be contacted by the doctor. Please follow up.

## 2016-09-04 NOTE — Therapy (Signed)
[place], [place], [address] Phone: [phone]   Fax:  [phone]  Physical Therapy Treatment  Patient Details  Name: Alicia Miller MRN: [id number] Date of Birth: [birth year] Referring Provider: Thurman Coyer, DO  Encounter Date: 09/04/2016      PT End of Session - 09/04/16 1107    Visit Number 3   Number of Visits 9   Date for PT Re-Evaluation 09/27/16   Authorization Type CAFA exp 11/23/16   PT Start Time 1104   PT Stop Time 1145   PT Time Calculation (min) 41 min   Activity Tolerance Patient tolerated treatment well   Behavior During Therapy [facility] for tasks assessed/performed      Past Medical History:  Diagnosis Date  . 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 Cancer (HCC)    Uterine  . Depression   . Left-sided Bell's palsy 01/19/2016  . No pertinent past medical history     Past Surgical History:  Procedure Laterality Date  . CHOLECYSTECTOMY  05/2015  . NO PAST SURGERIES      There were no vitals filed for this visit.      Subjective Assessment - 09/04/16 1106    Subjective Pt denies pain today and exercises at home are going well.    Patient Stated Goals decrease pain, knee pain with steps/stairs   Currently in Pain? No/denies                         [facility] Adult PT Treatment/Exercise - 09/04/16 0001      Knee/Hip Exercises: Stretches   Active Hamstring Stretch 5 reps;10 seconds;Both   Piriformis Stretch Both;30 seconds   Gastroc Stretch 2 reps;30 seconds   Gastroc Stretch Limitations slant board     Knee/Hip Exercises: Aerobic   Nustep L4 7' LE only     Knee/Hip Exercises: Standing   Heel Raises 20 reps   Heel Raises Limitations on airex   Rocker Board Limitations A/P and lateral 1' each   SLS on wobble board bilateral   Other Standing Knee Exercises side stepping yellow tband around feet   Other  Standing Knee Exercises standing squat in PF, clam red tband     Knee/Hip Exercises: Supine   Single Leg Bridge 10 reps;Both   Straight Leg Raise with External Rotation Both;15 reps   Other Supine Knee/Hip Exercises Le 90/90 lift with abdominal control, ball bw knees     Knee/Hip Exercises: Sidelying   Clams x15 each, green tband ankles elevated     Knee/Hip Exercises: Prone   Hip Extension Limitations hamstring curl + hip ext, cues for ankle control                PT Education - 09/04/16 1107    Education provided Yes   Education Details exercise form/rationale   Person(s) Educated Patient   Methods Explanation;Demonstration;Tactile cues;Verbal cues   Comprehension Verbalized understanding;Returned demonstration;Verbal cues required;Tactile cues required;Need further instruction          PT Short Term Goals - 08/26/16 1320      PT SHORT TERM GOAL #1   Title Pt will demo at least 4/5 in all LE MMT to indicate improvement in necessary muscular stability to LE biomechanical chain by 11/10   Baseline see flowsheet   Time 4   Period Weeks   Status New  PT SHORT TERM GOAL #2   Title FOTO to 73% ability to indicate significant improvement in functional ability   Baseline 53% ability at eval   Time 4   Period Weeks   Status New     PT SHORT TERM GOAL #3   Title Pt will verbalize comfort and understanding with independent program at gym   Baseline began establishing at eval   Time 4   Period Weeks   Status New     PT SHORT TERM GOAL #4   Title Pt will demo at least 5 deg DF bilaterally to improve appropriate biomechanical reactions to gait   Baseline 0 bilaterally at eval   Time 4   Period Weeks   Status New                  Plan - 09/04/16 1158    Clinical Impression Statement Increased instability in ankle joints has resulted in muscular tightness to proximal musculature resulting in knee pain. Challenged ankle stability today as well as  stretching proximal soft tissue.    PT Next Visit Plan L stretching, ankle stability   PT Home Exercise Plan resisted eversion, clams, long sitting gastroc & HS stretch, Bridge, Hip abduction, heel and toe raises; side stepping yellow tband at knees   Consulted and Agree with Plan of Care Patient      Patient will benefit from skilled therapeutic intervention in order to improve the following deficits and impairments:     Visit Diagnosis: Pain in right ankle and joints of right foot  Pain in left ankle and joints of left foot     Problem List Patient Active Problem List   Diagnosis Date Noted  . Vitamin D insufficiency 08/16/2016  . Joint laxity of right knee 08/15/2016  . Chronic pain of left ankle 08/15/2016  . Chronic pain of right ankle 08/15/2016  . Other fatigue 08/15/2016  . Stress incontinence, female 02/12/2016  . Breast tenderness in female 02/12/2016  . 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 Cervical high risk HPV (human papillomavirus) test positive 10/06/2015  . Microscopic hematuria 08/11/2015  . Allergic rhinitis 08/11/2015  . Snoring 08/11/2015  . Laryngopharyngeal reflux 08/11/2015  . Biliary calculi 06/13/2015  . Back muscle spasm 04/22/2014   Adison Jerger C. Jaima Janney PT, DPT 09/04/16 12:00 PM   [place] [facility] [address] [place], [place], [address] Phone: [phone]   Fax:  [phone]  Name: Alicia Miller MRN: [id number] Date of Birth: 26-Dec-1975

## 2016-09-04 NOTE — Telephone Encounter (Signed)
Will forward to nurse 

## 2016-09-05 NOTE — Telephone Encounter (Signed)
Dental referral placed.

## 2016-09-05 NOTE — Telephone Encounter (Signed)
Call placed to patient by rep Chadelle.  Patient requesting Dentist referral due to molar pain.

## 2016-09-05 NOTE — Telephone Encounter (Signed)
Will route to PCP 

## 2016-09-06 MED FILL — ?PENICILLIN VK 500 MG TABLE: 500 | 10 days supply | Qty: 40 | Fill #0

## 2016-09-09 ENCOUNTER — Ambulatory Visit: Payer: Self-pay | Admitting: Physical Therapy

## 2016-09-09 ENCOUNTER — Encounter: Payer: Self-pay | Admitting: Physical Therapy

## 2016-09-09 DIAGNOSIS — M25571 Pain in right ankle and joints of right foot: Secondary | ICD-10-CM

## 2016-09-09 DIAGNOSIS — M25572 Pain in left ankle and joints of left foot: Secondary | ICD-10-CM

## 2016-09-09 NOTE — Therapy (Signed)
[place], [place], [address] Phone: [phone]   Fax:  [phone]  Physical Therapy Treatment  Patient Details  Name: Alicia Miller MRN: [id number] Date of Birth: [birth year] Referring Provider: Thurman Coyer, DO  Encounter Date: 09/09/2016      PT End of Session - 09/09/16 0931    Visit Number 4   Number of Visits 9   Date for PT Re-Evaluation 09/27/16   Authorization Type CAFA exp 11/23/16   PT Start Time 0932   PT Stop Time 1013   PT Time Calculation (min) 41 min   Activity Tolerance Patient tolerated treatment well   Behavior During Therapy [facility] for tasks assessed/performed      Past Medical History:  Diagnosis Date  . 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 Cancer (HCC)    Uterine  . Depression   . Left-sided Bell's palsy 01/19/2016  . No pertinent past medical history     Past Surgical History:  Procedure Laterality Date  . CHOLECYSTECTOMY  05/2015  . NO PAST SURGERIES      There were no vitals filed for this visit.      Subjective Assessment - 09/09/16 0932    Subjective Pt denies pain today.    Currently in Pain? No/denies                         [facility] Adult PT Treatment/Exercise - 09/09/16 0001      Knee/Hip Exercises: Stretches   Passive Hamstring Stretch Both;30 seconds   Passive Hamstring Stretch Limitations supine with strap   Piriformis Stretch Both;30 seconds     Knee/Hip Exercises: Aerobic   Nustep L7 5'     Knee/Hip Exercises: Standing   Heel Raises Both;20 reps   Heel Raises Limitations single leg   SLS 2' each on airex  mild discomfort on R med & lateral     Knee/Hip Exercises: Supine   Bridges Limitations 2x10 PF raises, ball bw knees   Single Leg Bridge 10 reps;Both  green tband at knees     Knee/Hip Exercises: Sidelying   Hip ABduction Limitations iso abd hold +1.5lb ankle ER    Clams x20 each, green tband ankles elevated                PT Education - 09/09/16 1014    Education provided Yes   Education Details exercise form/rationale, LE biomechanical chain, HEP, importance of doing HEP   Person(s) Educated Patient   Methods Explanation;Demonstration;Tactile cues;Verbal cues;Handout   Comprehension Verbalized understanding;Returned demonstration;Verbal cues required;Tactile cues required;Need further instruction          PT Short Term Goals - 08/26/16 1320      PT SHORT TERM GOAL #1   Title Pt will demo at least 4/5 in all LE MMT to indicate improvement in necessary muscular stability to LE biomechanical chain by 11/10   Baseline see flowsheet   Time 4   Period Weeks   Status New     PT SHORT TERM GOAL #2   Title FOTO to 73% ability to indicate significant improvement in functional ability   Baseline 53% ability at eval   Time 4   Period Weeks   Status New     PT SHORT TERM GOAL #3   Title Pt will verbalize comfort and understanding with independent program at gym   Baseline began  establishing at eval   Time 4   Period Weeks   Status New     PT SHORT TERM GOAL #4   Title Pt will demo at least 5 deg DF bilaterally to improve appropriate biomechanical reactions to gait   Baseline 0 bilaterally at eval   Time 4   Period Weeks   Status New                  Plan - 09/09/16 1013    Clinical Impression Statement Mild discomfort in R ankle in SLS stability challenge. Fatigue and muscle shaking noted with exercises, especially those that challenged hip strength. Educated on role of hip external rotators in knee support. Pt is having xray performed on R knee to see if there is any arthris forming.    PT Next Visit Plan hip strengthening, ankle stability   Consulted and Agree with Plan of Care Patient      Patient will benefit from skilled therapeutic intervention in order to improve the following deficits and impairments:      Visit Diagnosis: Pain in right ankle and joints of right foot  Pain in left ankle and joints of left foot     Problem List Patient Active Problem List   Diagnosis Date Noted  . Vitamin D insufficiency 08/16/2016  . Joint laxity of right knee 08/15/2016  . Chronic pain of left ankle 08/15/2016  . Chronic pain of right ankle 08/15/2016  . Other fatigue 08/15/2016  . Stress incontinence, female 02/12/2016  . Breast tenderness in female 02/12/2016  . 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 Cervical high risk HPV (human papillomavirus) test positive 10/06/2015  . Microscopic hematuria 08/11/2015  . Allergic rhinitis 08/11/2015  . Snoring 08/11/2015  . Laryngopharyngeal reflux 08/11/2015  . Biliary calculi 06/13/2015  . Back muscle spasm 04/22/2014    [place] Olivier C. Dezmin Kittelson PT, DPT 09/09/16 10:16 AM  [facility] [address] [place], [place], [address] Phone: [phone]   Fax:  [phone]  Name: Alicia Miller MRN: [id number] Date of Birth: December 04, 1975

## 2016-09-12 ENCOUNTER — Ambulatory Visit: Payer: Self-pay | Admitting: Family Medicine

## 2016-09-12 ENCOUNTER — Ambulatory Visit: Payer: Self-pay | Admitting: Physical Therapy

## 2016-09-12 ENCOUNTER — Encounter: Payer: Self-pay | Admitting: Physical Therapy

## 2016-09-12 DIAGNOSIS — M25572 Pain in left ankle and joints of left foot: Secondary | ICD-10-CM

## 2016-09-12 DIAGNOSIS — M25571 Pain in right ankle and joints of right foot: Secondary | ICD-10-CM

## 2016-09-12 NOTE — Therapy (Signed)
Alicia Miller, [place], [address] Phone: [phone]   Fax:  [phone]  Physical Therapy Treatment  Patient Details  Name: Alicia Miller MRN: [id number] Date of Birth: [birth year] Referring Provider: Thurman Coyer, DO  Encounter Date: 09/12/2016      PT End of Session - 09/12/16 0931    Visit Number 5   Number of Visits 9   Date for PT Re-Evaluation 09/27/16   Authorization Type CAFA exp 11/23/16   PT Start Time 0931   PT Stop Time 1017   PT Time Calculation (min) 46 min      Past Medical History:  Diagnosis Date  . 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 Cancer (HCC)    Uterine  . Depression   . Left-sided Bell's palsy 01/19/2016  . No pertinent past medical history     Past Surgical History:  Procedure Laterality Date  . CHOLECYSTECTOMY  05/2015  . NO PAST SURGERIES      There were no vitals filed for this visit.      Subjective Assessment - 09/12/16 0931    Subjective pt reports ankles are feeling good today. Had very little pain since last visit.    Patient Stated Goals decrease pain, knee pain with steps/stairs   Currently in Pain? No/denies   Pain Score 3   Pain Location Knee   Pain Orientation Right   Pain Descriptors / Indicators --  popping, discomfort   Aggravating Factors  stairs/steps ascending                         [facility] Adult PT Treatment/Exercise - 09/12/16 0001      Knee/Hip Exercises: Stretches   Passive Hamstring Stretch Both;30 seconds   Passive Hamstring Stretch Limitations supine with strap   Piriformis Stretch Both;30 seconds   Gastroc Stretch 2 reps;30 seconds   Gastroc Stretch Limitations slant board     Knee/Hip Exercises: Aerobic   Elliptical 5 min L1 ramp 10     Knee/Hip Exercises: Standing   Heel Raises 20 reps;10 reps   Heel Raises Limitations external rotation   Forward  Lunges Limitations LLE slide posterior 3x10   Abduction Limitations LLE abduction, RLE iso mini squat 3x10 yellow tband   Step Down Limitations RLE eccentric lower, yellow taband at knees  ankle pain with proper alignment     Knee/Hip Exercises: Supine   Bridges Limitations in DF, yellow tband, marches to meet ball in hands     Knee/Hip Exercises: Prone   Straight Leg Raises Limitations quadruped, ER 3x10 lifts     Manual Therapy   Manual Therapy Taping   Kinesiotex Facilitate Muscle     Kinesiotix   Facilitate Muscle  femoral external rotation                PT Education - 09/12/16 0934    Education provided Yes   Education Details exercise form/rationale, use of bracing   Person(s) Educated Patient   Methods Explanation;Demonstration;Tactile cues;Verbal cues   Comprehension Verbalized understanding;Returned demonstration;Verbal cues required;Tactile cues required;Need further instruction          PT Short Term Goals - 08/26/16 1320      PT SHORT TERM GOAL #1   Title Pt will demo at least 4/5 in all LE MMT to indicate improvement in necessary muscular stability to LE biomechanical chain by 11/10  Baseline see flowsheet   Time 4   Period Weeks   Status New     PT SHORT TERM GOAL #2   Title FOTO to 73% ability to indicate significant improvement in functional ability   Baseline 53% ability at eval   Time 4   Period Weeks   Status New     PT SHORT TERM GOAL #3   Title Pt will verbalize comfort and understanding with independent program at gym   Baseline began establishing at eval   Time 4   Period Weeks   Status New     PT SHORT TERM GOAL #4   Title Pt will demo at least 5 deg DF bilaterally to improve appropriate biomechanical reactions to gait   Baseline 0 bilaterally at eval   Time 4   Period Weeks   Status New                  Plan - 09/12/16 0949    Clinical Impression Statement pt experienced ankle discomfort when knee was  properly aligned due to instability. Instructed that this will improve. Continues to demo significant weakness in hip abduction and ER musculature which will benefit from further challengin to provide support to LE biomechanical chain. Pt asked about using rods to support her knee, I explained that her knee is able to obtain appropraite alignment, she just needs to train muscular control in that position; therefore, using a brace would weaken her muscles and increase knee pain.    PT Next Visit Plan hip strengthening, ankle stability   PT Home Exercise Plan resisted eversion, clams, long sitting gastroc & HS stretch, Bridge, Hip abduction, heel and toe raises; side stepping yellow tband at knees; qped SLR in ER   Consulted and Agree with Plan of Care Patient      Patient will benefit from skilled therapeutic intervention in order to improve the following deficits and impairments:     Visit Diagnosis: Pain in right ankle and joints of right foot  Pain in left ankle and joints of left foot     Problem List Patient Active Problem List   Diagnosis Date Noted  . Vitamin D insufficiency 08/16/2016  . Joint laxity of right knee 08/15/2016  . Chronic pain of left ankle 08/15/2016  . Chronic pain of right ankle 08/15/2016  . Other fatigue 08/15/2016  . Stress incontinence, female 02/12/2016  . Breast tenderness in female 02/12/2016  . 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 Cervical high risk HPV (human papillomavirus) test positive 10/06/2015  . Microscopic hematuria 08/11/2015  . Allergic rhinitis 08/11/2015  . Snoring 08/11/2015  . Laryngopharyngeal reflux 08/11/2015  . Biliary calculi 06/13/2015  . Back muscle spasm 04/22/2014   Alicia Miller C. Alicia Miller PT, DPT 09/12/16 12:57 PM   [name] [address] [place], [place], [address] Phone: [phone]   Fax:  [phone]  Name: Alicia Miller MRN: [id number] Date of  Birth: Nov 03, 1976

## 2016-09-16 ENCOUNTER — Encounter: Payer: Self-pay | Admitting: Physical Therapy

## 2016-09-16 ENCOUNTER — Ambulatory Visit: Payer: Self-pay | Admitting: Physical Therapy

## 2016-09-16 DIAGNOSIS — M25572 Pain in left ankle and joints of left foot: Secondary | ICD-10-CM

## 2016-09-16 DIAGNOSIS — M25571 Pain in right ankle and joints of right foot: Secondary | ICD-10-CM

## 2016-09-16 NOTE — Therapy (Signed)
[place], [place], [address] Phone: [phone]   Fax:  [phone]  Physical Therapy Treatment  Patient Details  Name: Alicia Miller MRN: [id number] Date of Birth: 1976-04-03 Referring Provider: Thurman Coyer, DO  Encounter Date: 09/16/2016      PT End of Session - 09/16/16 1458    Visit Number 6   Number of Visits 9   Date for PT Re-Evaluation 09/27/16   Authorization Type CAFA exp 11/23/16   PT Start Time 1459   PT Stop Time 1543   PT Time Calculation (min) 44 min   Activity Tolerance Patient tolerated treatment well   Behavior During Therapy [facility] for tasks assessed/performed      Past Medical History:  Diagnosis Date  . 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 Cancer (HCC)    Uterine  . Depression   . Left-sided Bell's palsy 01/19/2016  . No pertinent past medical history     Past Surgical History:  Procedure Laterality Date  . CHOLECYSTECTOMY  05/2015  . NO PAST SURGERIES      There were no vitals filed for this visit.      Subjective Assessment - 09/16/16 1459    Subjective Pt reports a little bit of knee pain since seeing her last.    Patient Stated Goals decrease pain, knee pain with steps/stairs   Currently in Pain? No/denies   Pain Location Ankle   Pain Score 3   Pain Location Knee   Pain Orientation Right                         [facility] Adult PT Treatment/Exercise - 09/16/16 0001      Knee/Hip Exercises: Stretches   Passive Hamstring Stretch Both;30 seconds   Gastroc Stretch 2 reps;30 seconds   Gastroc Stretch Limitations slant board   Soleus Stretch Limitations figure 4 30s each     Knee/Hip Exercises: Aerobic   Elliptical 5 min L1 ramp 10     Knee/Hip Exercises: Machines for Strengthening   Cybex Leg Press 45# 3x20   Other Tour manager- see note     Knee/Hip Exercises: Standing   Other  Standing Knee Exercises illiopsoas walk yellow tband      Reformer: 2red 1 blue bilateral press with pelvic tilt Static bridges, ball bw knees iso bridge with press, ball bw knees UE press 1 red 1 blue sidelying press bilateral 1 red 1 blue          PT Education - 09/16/16 1501    Education provided Yes   Education Details exercise form/rationale   Person(s) Educated Patient   Methods Explanation;Demonstration;Tactile cues;Verbal cues   Comprehension Verbalized understanding;Returned demonstration;Verbal cues required;Tactile cues required;Need further instruction          PT Short Term Goals - 08/26/16 1320      PT SHORT TERM GOAL #1   Title Pt will demo at least 4/5 in all LE MMT to indicate improvement in necessary muscular stability to LE biomechanical chain by 11/10   Baseline see flowsheet   Time 4   Period Weeks   Status New     PT SHORT TERM GOAL #2   Title FOTO to 73% ability to indicate significant improvement in functional ability   Baseline 53% ability at eval   Time 4   Period Weeks   Status New  PT SHORT TERM GOAL #3   Title Pt will verbalize comfort and understanding with independent program at gym   Baseline began establishing at eval   Time 4   Period Weeks   Status New     PT SHORT TERM GOAL #4   Title Pt will demo at least 5 deg DF bilaterally to improve appropriate biomechanical reactions to gait   Baseline 0 bilaterally at eval   Time 4   Period Weeks   Status New                  Plan - 09/16/16 1544    Clinical Impression Statement Pt tolerated reformer well, no c/o pain but with notable fatigue and shaking. Pt was educated on importance of continued exercise after leaving PT so she can continue to strengthen. Suggested gym membership or pilates class.    PT Next Visit Plan hip strengthening, ankle stability   PT Home Exercise Plan resisted eversion, clams, long sitting gastroc & HS stretch, Bridge, Hip abduction, heel  and toe raises; side stepping yellow tband at knees; qped SLR in ER   Consulted and Agree with Plan of Care Patient      Patient will benefit from skilled therapeutic intervention in order to improve the following deficits and impairments:     Visit Diagnosis: Pain in right ankle and joints of right foot  Pain in left ankle and joints of left foot     Problem List Patient Active Problem List   Diagnosis Date Noted  . Vitamin D insufficiency 08/16/2016  . Joint laxity of right knee 08/15/2016  . Chronic pain of left ankle 08/15/2016  . Chronic pain of right ankle 08/15/2016  . Other fatigue 08/15/2016  . Stress incontinence, female 02/12/2016  . Breast tenderness in female 02/12/2016  . 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 Cervical high risk HPV (human papillomavirus) test positive 10/06/2015  . Microscopic hematuria 08/11/2015  . Allergic rhinitis 08/11/2015  . Snoring 08/11/2015  . Laryngopharyngeal reflux 08/11/2015  . Biliary calculi 06/13/2015  . Back muscle spasm 04/22/2014   Ranell Finelli C. Ronen Bromwell PT, DPT 09/16/16 3:46 PM   [place] [facility] [address] [place], [place], [address] Phone: [phone]   Fax:  [phone]  Name: Alicia Miller MRN: [id number] Date of Birth: 1976/10/05

## 2016-09-18 ENCOUNTER — Encounter: Payer: Self-pay | Admitting: Family Medicine

## 2016-09-18 ENCOUNTER — Ambulatory Visit (INDEPENDENT_AMBULATORY_CARE_PROVIDER_SITE_OTHER): Payer: Self-pay | Admitting: Family Medicine

## 2016-09-18 DIAGNOSIS — M25572 Pain in left ankle and joints of left foot: Secondary | ICD-10-CM

## 2016-09-18 DIAGNOSIS — M25571 Pain in right ankle and joints of right foot: Secondary | ICD-10-CM

## 2016-09-18 DIAGNOSIS — G8929 Other chronic pain: Secondary | ICD-10-CM

## 2016-09-18 DIAGNOSIS — M238X1 Other internal derangements of right knee: Secondary | ICD-10-CM

## 2016-09-18 NOTE — Progress Notes (Signed)
Alicia Miller - 40 y.o. female MRN [id number]  Date of birth: [birth year]  SUBJECTIVE:  Including CC & ROS.  Chief Complaint  Patient presents with  . Ankle Pain  . Knee Pain    The entirety of this visit was conducted with a Spanish interpreter.   [name] is a 40 yo F that is following up for bilateral ankle pain and right knee pain. She reports that she had significant pain yesterday in her ankles. She has been referred to physical therapy. She is also been taking an anti-inflammatory. She does not do any significant exercise but feels the pain when she has to do work around the house. The pain is most significant in her ankles when she has to push off. She has had ankle sprains of both ankles in the past.  She reports that she has right knee pain that is worse with going up or down stairs. The pain is worse when she has to do a squat and has been seen in physical therapy. She has pain on the anterior aspect of the knee. She denies any swelling.  ROS: No unexpected weight loss, fever, chills, swelling, instability, muscle pain, numbness/tingling, redness, otherwise see HPI   HISTORY: Past Medical, Surgical, Social, and Family History Reviewed & Updated per EMR.   Pertinent Historical Findings include: FHx -  Dm2, cancer  DATA REVIEWED: 08/21/16: Right knee x-ray: No demonstrable fracture or joint effusion.  No evident arthropathy. 08/15/16: B/l ankle x-ray: no abnormality   PHYSICAL EXAM:  VS: BP:114/80  HR: bpm  TEMP: ( )  RESP:   HT:5\' 2"  (157.5 cm)   WT:142 lb (64.4 kg)  BMI:26 PHYSICAL EXAM: Gen: NAD, alert, cooperative with exam, well-appearing HEENT: clear conjunctiva, EOMI CV:  no edema, capillary refill brisk,  Resp: non-labored, normal speech Skin: no rashes, normal turgor  Neuro: no gross deficits.  Psych:  alert and oriented Foot:  Normal range of motion bilaterally. Normal strength in all directions. Significant flattening of the transverse  arch bilaterally. Has callus formation under the fifth metatarsal head bilaterally. No tenderness to palpation over the metatarsals. Right knee: No obvious effusion. No tenderness to palpation of the medial or lateral joint line. Normal quad and patellar tendon. Normal flexion and extension. No pain with patellar grind. Negative McMurray's test. No pain with valgus or varus stress testing. She has weakness on the right with hip abduction.  ASSESSMENT & PLAN:   Chronic pain of right ankle Likely she is having excessive wobble of the talotibial joint with the flattening of her transverse arch. Her x-rays do not demonstrate any excessive arthritic change. - Green sport insoles placed today size 6-7 - Small scaphoid pad placed. - If she has improvement with this then consider custom orthotics. - She was given home exercises.  Chronic pain of left ankle Likely she is having excessive wobble of the talotibial joint with the flattening of her transverse arch. Her x-rays do not demonstrate any excessive arthritic change. - Green sport insoles placed today size 6-7 - Small scaphoid pad placed. - If she has improvement with this then consider custom orthotics. - She was given home exercises.  Joint laxity of right knee She demonstrated weak hip abduction which is likely contributing. Most likely it is patellofemoral in nature as x-rays don't show any significant arthritic change. If we can improve her ankle pain and hip strength then likely will have benefit with her knee problem. - Encouraged home exercises. - She'll follow-up in 4  weeks if no improvement.

## 2016-09-19 ENCOUNTER — Encounter: Payer: Self-pay | Admitting: Physical Therapy

## 2016-09-19 ENCOUNTER — Ambulatory Visit: Payer: Self-pay | Attending: Sports Medicine | Admitting: Physical Therapy

## 2016-09-19 DIAGNOSIS — M25571 Pain in right ankle and joints of right foot: Secondary | ICD-10-CM | POA: Insufficient documentation

## 2016-09-19 DIAGNOSIS — M25572 Pain in left ankle and joints of left foot: Secondary | ICD-10-CM | POA: Insufficient documentation

## 2016-09-19 NOTE — Assessment & Plan Note (Addendum)
Likely she is having excessive wobble of the talotibial joint with the flattening of her transverse arch. Her x-rays do not demonstrate any excessive arthritic change. - Green sport insoles placed today size 6-7 - Small scaphoid pad placed. - If she has improvement with this then consider custom orthotics. - She was given home exercises.

## 2016-09-19 NOTE — Assessment & Plan Note (Signed)
She demonstrated weak hip abduction which is likely contributing. Most likely it is patellofemoral in nature as x-rays don't show any significant arthritic change. If we can improve her ankle pain and hip strength then likely will have benefit with her knee problem. - Encouraged home exercises. - She'll follow-up in 4 weeks if no improvement.

## 2016-09-19 NOTE — Therapy (Signed)
[place], [place], [address] Phone: [phone]   Fax:  [phone]  Physical Therapy Treatment  Patient Details  Name: Alicia Miller MRN: [id number] Date of Birth: 1976/04/04 Referring Provider: Thurman Coyer, DO  Encounter Date: 09/19/2016      PT End of Session - 09/19/16 0856    Visit Number 7   Number of Visits 9   Date for PT Re-Evaluation 09/27/16   Authorization Type CAFA exp 11/23/16   PT Start Time 0850   PT Stop Time 0930   PT Time Calculation (min) 40 min   Activity Tolerance Patient tolerated treatment well   Behavior During Therapy [facility] for tasks assessed/performed      Past Medical History:  Diagnosis Date  . 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 Cancer (HCC)    Uterine  . Depression   . Left-sided Bell's palsy 01/19/2016  . No pertinent past medical history     Past Surgical History:  Procedure Laterality Date  . CHOLECYSTECTOMY  05/2015  . NO PAST SURGERIES      There were no vitals filed for this visit.      Subjective Assessment - 09/19/16 0857    Subjective Pt reports she was provided insoles but they make the area around her heel very tired. A little pain in knee today. Reports making the wrong motion with her ankles and stretched them too far.    Patient Stated Goals decrease pain, knee pain with steps/stairs   Currently in Pain? No/denies   Pain Score 3   Pain Location Knee   Pain Orientation Right                         [facility] Adult PT Treatment/Exercise - 09/19/16 0001      Knee/Hip Exercises: Stretches   Passive Hamstring Stretch Both;30 seconds   Passive Hamstring Stretch Limitations supine with strap   Quad Stretch Limitations standing quad stretch   Gastroc Stretch Limitations runner stretch bilat     Knee/Hip Exercises: Aerobic   Elliptical 10 min L1 ramp 10     Knee/Hip  Exercises: Standing   Heel Raises Limitations toe walking fwd/retro with pelvic tilt   Hip Extension 2 sets;10 reps;Both   Extension Limitations knee flexed holding ball   Functional Squat 15 reps   Functional Squat Limitations cues for form                PT Education - 09/19/16 0930    Education provided Yes   Education Details exercise form/rationale, food/water intake, BP when exercising, chaning inserts in shoes   Person(s) Educated Patient   Methods Explanation;Demonstration;Tactile cues;Handout;Verbal cues   Comprehension Verbalized understanding;Returned demonstration;Verbal cues required;Tactile cues required;Need further instruction          PT Short Term Goals - 08/26/16 1320      PT SHORT TERM GOAL #1   Title Pt will demo at least 4/5 in all LE MMT to indicate improvement in necessary muscular stability to LE biomechanical chain by 11/10   Baseline see flowsheet   Time 4   Period Weeks   Status New     PT SHORT TERM GOAL #2   Title FOTO to 73% ability to indicate significant improvement in functional ability   Baseline 53% ability at eval   Time 4   Period Weeks   Status New  PT SHORT TERM GOAL #3   Title Pt will verbalize comfort and understanding with independent program at gym   Baseline began establishing at eval   Time 4   Period Weeks   Status New     PT SHORT TERM GOAL #4   Title Pt will demo at least 5 deg DF bilaterally to improve appropriate biomechanical reactions to gait   Baseline 0 bilaterally at eval   Time 4   Period Weeks   Status New                  Plan - 09/19/16 0900    Clinical Impression Statement Knee pain resolved following eliptical at the beginning of treatment. After technique correction during squats, pt was able to perform with proper form without complaints of increased pain. Significant improvement in ankle stability and gait pattern with shoe inserts noted. Discussed transition to pilates classes  following d/c for further strengthening.    PT Next Visit Plan hip strengthening, ankle stability   PT Home Exercise Plan resisted eversion, clams, long sitting gastroc & HS stretch, Bridge, Hip abduction, heel and toe raises; side stepping yellow tband at knees; qped SLR in ER   Consulted and Agree with Plan of Care Patient      Patient will benefit from skilled therapeutic intervention in order to improve the following deficits and impairments:     Visit Diagnosis: Pain in right ankle and joints of right foot  Pain in left ankle and joints of left foot     Problem List Patient Active Problem List   Diagnosis Date Noted  . Vitamin D insufficiency 08/16/2016  . Joint laxity of right knee 08/15/2016  . Chronic pain of left ankle 08/15/2016  . Chronic pain of right ankle 08/15/2016  . Other fatigue 08/15/2016  . Stress incontinence, female 02/12/2016  . Breast tenderness in female 02/12/2016  . 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 Cervical high risk HPV (human papillomavirus) test positive 10/06/2015  . Microscopic hematuria 08/11/2015  . Allergic rhinitis 08/11/2015  . Snoring 08/11/2015  . Laryngopharyngeal reflux 08/11/2015  . Biliary calculi 06/13/2015  . Back muscle spasm 04/22/2014    Shondra Capps C. Meryl Ponder PT, DPT 09/19/16 9:39 AM   [facility] [address] [place], [place], [address] Phone: [phone]   Fax:  [phone]  Name: Alicia Miller MRN: [id number] Date of Birth: 07-Mar-1976

## 2016-09-20 ENCOUNTER — Ambulatory Visit: Payer: Self-pay | Admitting: Family Medicine

## 2016-09-23 ENCOUNTER — Ambulatory Visit: Payer: Self-pay | Admitting: Physical Therapy

## 2016-09-23 ENCOUNTER — Encounter: Payer: Self-pay | Admitting: Physical Therapy

## 2016-09-23 DIAGNOSIS — M25571 Pain in right ankle and joints of right foot: Secondary | ICD-10-CM

## 2016-09-23 DIAGNOSIS — M25572 Pain in left ankle and joints of left foot: Secondary | ICD-10-CM

## 2016-09-23 NOTE — Therapy (Signed)
[place], [place], [address] Phone: [phone]   Fax:  [phone]  Physical Therapy Treatment  Patient Details  Name: Alicia Miller MRN: [id number] Date of Birth: [birth year] Referring Provider: Thurman Coyer, DO  Encounter Date: 09/23/2016      PT End of Session - 09/23/16 1148    Visit Number 8   Number of Visits 9   Date for PT Re-Evaluation 09/27/16   Authorization Type CAFA exp 11/23/16   PT Start Time 1148   PT Stop Time 1227   PT Time Calculation (min) 39 min   Activity Tolerance Patient tolerated treatment well   Behavior During Therapy [facility] for tasks assessed/performed      Past Medical History:  Diagnosis Date  . 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 Cancer (HCC)    Uterine  . Depression   . Left-sided Bell's palsy 01/19/2016  . No pertinent past medical history     Past Surgical History:  Procedure Laterality Date  . CHOLECYSTECTOMY  05/2015  . NO PAST SURGERIES      There were no vitals filed for this visit.      Subjective Assessment - 09/23/16 1149    Subjective pt reports she is doing good today, getting used to inserts. Knee bothers her just a little bit.    Patient Stated Goals decrease pain, knee pain with steps/stairs   Currently in Pain? No/denies   Pain Score 2   Pain Location Knee   Pain Orientation Right   Pain Descriptors / Indicators Sharp   Aggravating Factors  first couple of steps on elipitical   Pain Relieving Factors rest                         [facility] Adult PT Treatment/Exercise - 09/23/16 0001      Exercises   Exercises Other Exercises   Other Exercises  front plank series and side planks     Knee/Hip Exercises: Stretches   Passive Hamstring Stretch Both;30 seconds   Passive Hamstring Stretch Limitations seated EOB   Quad Stretch Limitations standing quad stretch   Piriformis Stretch Both;20 seconds   Gastroc Stretch Limitations runner stretch bilat     Knee/Hip Exercises: Aerobic   Elliptical 10 min L1 ramp 10     Knee/Hip Exercises: Standing   Other Standing Knee Exercises illiopsoas walk yellow tband     Knee/Hip Exercises: Prone   Hip Extension Limitations paired with HS curl   Straight Leg Raises Limitations quadruped, ER 3x10 lifts                PT Education - 09/23/16 1151    Education provided Yes   Education Details exercise form/rationale   Person(s) Educated Patient   Methods Explanation;Demonstration;Tactile cues;Verbal cues   Comprehension Verbalized understanding;Returned demonstration;Verbal cues required;Tactile cues required;Need further instruction          PT Short Term Goals - 08/26/16 1320      PT SHORT TERM GOAL #1   Title Pt will demo at least 4/5 in all LE MMT to indicate improvement in necessary muscular stability to LE biomechanical chain by 11/10   Baseline see flowsheet   Time 4   Period Weeks   Status New     PT SHORT TERM GOAL #2   Title FOTO to 73% ability to indicate significant improvement in functional ability  Baseline 53% ability at eval   Time 4   Period Weeks   Status New     PT SHORT TERM GOAL #3   Title Pt will verbalize comfort and understanding with independent program at gym   Baseline began establishing at eval   Time 4   Period Weeks   Status New     PT SHORT TERM GOAL #4   Title Pt will demo at least 5 deg DF bilaterally to improve appropriate biomechanical reactions to gait   Baseline 0 bilaterally at eval   Time 4   Period Weeks   Status New                  Plan - 09/23/16 1250    Clinical Impression Statement Denied increase in knee pain until performing deep squats. Able to do mini squat without pain, discussed strengthening in pain free ranges for further improvements. Shaking noted in LE musculature and significant fatgiue, stressed importance of  continued exercise following d/c.    PT Next Visit Plan d/c   PT Home Exercise Plan resisted eversion, clams, long sitting gastroc & HS stretch, Bridge, Hip abduction, heel and toe raises; side stepping yellow tband at knees; qped SLR in ER; plank fwd & side, prone hip ext with knee flexed   Consulted and Agree with Plan of Care Patient      Patient will benefit from skilled therapeutic intervention in order to improve the following deficits and impairments:     Visit Diagnosis: Pain in right ankle and joints of right foot  Pain in left ankle and joints of left foot     Problem List Patient Active Problem List   Diagnosis Date Noted  . Vitamin D insufficiency 08/16/2016  . Joint laxity of right knee 08/15/2016  . Chronic pain of left ankle 08/15/2016  . Chronic pain of right ankle 08/15/2016  . Other fatigue 08/15/2016  . Stress incontinence, female 02/12/2016  . Breast tenderness in female 02/12/2016  . 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 Cervical high risk HPV (human papillomavirus) test positive 10/06/2015  . Microscopic hematuria 08/11/2015  . Allergic rhinitis 08/11/2015  . Snoring 08/11/2015  . Laryngopharyngeal reflux 08/11/2015  . Biliary calculi 06/13/2015  . Back muscle spasm 04/22/2014    Chayden Garrelts C. Sherwood Castilla PT, DPT 09/23/16 12:53 PM   [place] [facility] [address] [place], [place], [address] Phone: [phone]   Fax:  [phone]  Name: Niajah Mauel MRN: [id number] Date of Birth: 11-Sep-1976

## 2016-09-26 ENCOUNTER — Encounter: Payer: Self-pay | Admitting: Physical Therapy

## 2016-09-26 ENCOUNTER — Ambulatory Visit: Payer: Self-pay | Admitting: Physical Therapy

## 2016-09-26 DIAGNOSIS — M25571 Pain in right ankle and joints of right foot: Secondary | ICD-10-CM

## 2016-09-26 DIAGNOSIS — M25572 Pain in left ankle and joints of left foot: Secondary | ICD-10-CM

## 2016-09-26 NOTE — Therapy (Signed)
[facility] [address] [place], [place], [address] Phone: [phone]   Fax:  [phone]  Physical Therapy Treatment/Discharge Summary  Patient Details  Name: Alicia Miller MRN: [id number] Date of Birth: 01-05-1976 Referring Provider: Ralene Cork, DO  Encounter Date: 09/26/2016      PT End of Session - 09/26/16 0805    Visit Number 9   Number of Visits 9   Date for PT Re-Evaluation 09/27/16   Authorization Type CAFA exp 11/23/16   PT Start Time 0803   PT Stop Time 0846   PT Time Calculation (min) 43 min   Activity Tolerance Patient tolerated treatment well   Behavior During Therapy [facility] for tasks assessed/performed      Past Medical History:  Diagnosis Date  . 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 Cancer (HCC)    Uterine  . Depression   . Left-sided Bell's palsy 01/19/2016  . No pertinent past medical history     Past Surgical History:  Procedure Laterality Date  . CHOLECYSTECTOMY  05/2015  . NO PAST SURGERIES      There were no vitals filed for this visit.      Subjective Assessment - 09/26/16 0805    Subjective Pt denies pain this morning. Is doing exercises at home but is having a hard time "keeping the posture I am supposed to have"   Currently in Pain? No/denies            [facility] PT Assessment - 09/26/16 0001      Observation/Other Assessments   Focus on Therapeutic Outcomes (FOTO)  69% ability     AROM   Right Ankle Dorsiflexion 5   Left Ankle Dorsiflexion 4     Strength   Right Hip Extension 5/5   Right Hip ABduction 4+/5   Left Hip Extension 5/5   Left Hip ABduction 4/5   Right Knee Flexion 4+/5   Right Knee Extension 5/5   Left Knee Flexion 4+/5   Right Ankle Dorsiflexion 5/5   Right Ankle Eversion 4/5   Left Ankle Dorsiflexion 4+/5   Left Ankle Inversion 5/5   Left Ankle Eversion 4+/5                      [facility] Adult PT Treatment/Exercise - 09/26/16 0001      Exercises   Other Exercises  plank series and progressions     Knee/Hip Exercises: Aerobic   Elliptical 10 min L1 ramp 10     Knee/Hip Exercises: Machines for Strengthening   Cybex Knee Extension 15#x30   Cybex Knee Flexion 15#x30   Cybex Leg Press 20# x30                PT Education - 09/26/16 0806    Education provided Yes   Education Details exercise form/rationale, HEP, gym equipment and form, importance of continued strengthening   Person(s) Educated Patient   Methods Explanation;Demonstration;Tactile cues;Verbal cues   Comprehension Verbalized understanding;Returned demonstration;Verbal cues required;Tactile cues required          PT Short Term Goals - 09/26/16 0854      PT SHORT TERM GOAL #1   Title Pt will demo at least 4/5 in all LE MMT to indicate improvement in necessary muscular stability to LE biomechanical chain by 11/10   Status Achieved     PT SHORT TERM GOAL #2   Title FOTO to  73% ability to indicate significant improvement in functional ability   Baseline 69% ability reported at d/c   Status Not Met     PT SHORT TERM GOAL #3   Title Pt will verbalize comfort and understanding with independent program at gym   Status Achieved     PT SHORT TERM GOAL #4   Title Pt will demo at least 5 deg DF bilaterally to improve appropriate biomechanical reactions to gait   Baseline see flowsheet   Status Partially Met                  Plan - 09/26/16 0853    Clinical Impression Statement Discussed use of gym equipment and further strengthening. I encouraged her to utilize trainers for education on use of machines so she can be comfortable where she joins. pt verbalized comfort with independence and was instructed to contact us with any further questions.    Consulted and Agree with Plan of Care Patient      Patient will benefit from skilled therapeutic intervention in order to improve the  following deficits and impairments:     Visit Diagnosis: Pain in right ankle and joints of right foot  Pain in left ankle and joints of left foot     Problem List Patient Active Problem List   Diagnosis Date Noted  . Vitamin D insufficiency 08/16/2016  . Joint laxity of right knee 08/15/2016  . Chronic pain of left ankle 08/15/2016  . Chronic pain of right ankle 08/15/2016  . Other fatigue 08/15/2016  . Stress incontinence, female 02/12/2016  . Breast tenderness in female 02/12/2016  . 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 Cervical high risk HPV (human papillomavirus) test positive 10/06/2015  . Microscopic hematuria 08/11/2015  . Allergic rhinitis 08/11/2015  . Snoring 08/11/2015  . Laryngopharyngeal reflux 08/11/2015  . Biliary calculi 06/13/2015  . Back muscle spasm 04/22/2014   PHYSICAL THERAPY DISCHARGE SUMMARY  Visits from Start of Care: 9  Current functional level related to goals / functional outcomes: See above   Remaining deficits: See above   Education / Equipment: Anatomy of condition, POC, HEP, exercise form/rationale  Plan: Patient agrees to discharge.  Patient goals were partially met. Patient is being discharged due to being pleased with the current functional level.  ?????       Nas Wafer C. Vada Swift PT, DPT 09/26/16 8:57 AM   [place] [facility] [address] [place], [place], [address] Phone: [phone]   Fax:  [phone]  Name: Alicia Miller MRN: [id number] Date of Birth: 11-30-1975

## 2016-10-07 ENCOUNTER — Ambulatory Visit: Payer: Self-pay | Attending: Family Medicine | Admitting: Family Medicine

## 2016-10-07 ENCOUNTER — Encounter: Payer: Self-pay | Admitting: Family Medicine

## 2016-10-07 VITALS — BP 119/79 | HR 76 | Temp 98.6°F | Ht 62.0 in | Wt 146.8 lb

## 2016-10-07 DIAGNOSIS — E559 Vitamin D deficiency, unspecified: Secondary | ICD-10-CM | POA: Insufficient documentation

## 2016-10-07 DIAGNOSIS — J029 Acute pharyngitis, unspecified: Secondary | ICD-10-CM | POA: Insufficient documentation

## 2016-10-07 DIAGNOSIS — G8929 Other chronic pain: Secondary | ICD-10-CM

## 2016-10-07 DIAGNOSIS — R0982 Postnasal drip: Secondary | ICD-10-CM | POA: Insufficient documentation

## 2016-10-07 DIAGNOSIS — K219 Gastro-esophageal reflux disease without esophagitis: Secondary | ICD-10-CM | POA: Insufficient documentation

## 2016-10-07 DIAGNOSIS — M238X1 Other internal derangements of right knee: Secondary | ICD-10-CM

## 2016-10-07 DIAGNOSIS — M25571 Pain in right ankle and joints of right foot: Secondary | ICD-10-CM | POA: Insufficient documentation

## 2016-10-07 DIAGNOSIS — M25572 Pain in left ankle and joints of left foot: Secondary | ICD-10-CM | POA: Insufficient documentation

## 2016-10-07 MED ORDER — FEXOFENADINE HCL 180 MG PO TABS: 180.0000 mg | ORAL_TABLET | Freq: Every day | ORAL | 2 refills | Status: DC

## 2016-10-07 NOTE — Assessment & Plan Note (Signed)
Improved Continue  in soles  Continue PT exercises at home

## 2016-10-07 NOTE — Assessment & Plan Note (Signed)
Improved Continue sports medicine

## 2016-10-07 NOTE — Assessment & Plan Note (Signed)
Improved symptoms with replacement Recheck level at next visit

## 2016-10-07 NOTE — Progress Notes (Signed)
Pt is here today to follow up on ankle pain. Pt states that she is having some throat pain.

## 2016-10-07 NOTE — Progress Notes (Signed)
Subjective:  Patient ID: Alicia Miller, female    DOB: [birth year]  Age: 40 y.o. MRN: [id number] [facility] spanish interpreter used  CC: Ankle Pain   HPI Alicia Miller has hx of adenocarcinoma in situ of cervix, recent pap negative 07/25/2016, she presents for   1. Ankle pain: bilateral. Worse on R. She injured her ankles 8-10 years ago. She was jumping from a height and injured both ankles. At the time she has swelling and pain in her ankles. Now she has pain and brittle feeling in both ankles particularly during cold weather. One week ago she worse a pair of wedge heels and her ankles became painful. She also has pain when she wears flats. She denies foot and dorsal heel pain. She gets pain in lateral ankles with swelling. No skin discoloration.   Previous treatment with topical pain relief ointments and wearing compression socks, physical therapy. She currently has no ankle pain.  She has pain at times when she walks.   2. R knee laxity: also for past 8-10 years. Have feeling of joint laxity and crepitus. Also joint swelling. She denies knee pain.  She has tried some home strengthening exercises in the past that caused knee pain.  No pain in need currently. Has a little anterior knee pain when she squats or stands from prolonged sitting.   She takes naproxen when needed.   3. Vit D insufficiency: taking once monthly vitamin D. Her fatigue has improved.   4. Sore throat: she has diagnosis of GERD. She has been evaluated by ENT. She reports that the PPI has not helped. She feel phlegm in her throat day and night. The symptom is worse at night. She has choking sensation at night.   5. Stomach pains: started two weeks ago. Occurred on 2 days. Felt pain in the pit of her stomach with bloating. The pain radiated to her back. She had similar pain prior to her gallbladder removal. The most recent episode she only felt pain in her back with a little nausea. Her period started on  Friday 10/04/17. Since starting her period her bloating has resolved. Just a little back pain currently.   Social History  Substance Use Topics  . Smoking status: Never Smoker  . Smokeless tobacco: Never Used  . Alcohol use No    Outpatient Medications Prior to Visit  Medication Sig Dispense Refill  . Multiple Vitamins-Minerals (MULTIVITAMIN WITH MINERALS) tablet Take 1 tablet by mouth daily. Reported on 01/19/2016    . naproxen (NAPROSYN) 500 MG tablet Take one tab twice a day with food as needed for pain 60 tablet 0  . Vitamin D, Ergocalciferol, (DRISDOL) 50000 units CAPS capsule Take 1 capsule (50,000 Units total) by mouth every 30 (thirty) days. 12 capsule 0  . ibuprofen (ADVIL,MOTRIN) 600 MG tablet Take 600 mg by mouth every 6 (six) hours as needed. Reported on 01/19/2016    . naproxen (NAPROSYN) 500 MG tablet Take 1 tablet (500 mg total) by mouth 2 (two) times daily with a meal. For next 5 days 10 tablet 0  . omeprazole (PRILOSEC) 40 MG capsule 40 mg. Reported on 02/12/2016  11   No facility-administered medications prior to visit.     ROS Review of Systems  Constitutional: Positive for fatigue. Negative for chills and fever.  HENT: Positive for congestion, postnasal drip and sore throat.   Eyes: Negative for visual disturbance.  Respiratory: Negative for shortness of breath.   Cardiovascular: Negative for chest pain.  Gastrointestinal:  Positive for abdominal pain and nausea. Negative for abdominal distention, anal bleeding, constipation, diarrhea, rectal pain and vomiting.  Musculoskeletal: Positive for arthralgias and back pain.  Skin: Negative for rash.  Allergic/Immunologic: Negative for immunocompromised state.  Hematological: Negative for adenopathy. Does not bruise/bleed easily.  Psychiatric/Behavioral: Positive for dysphoric mood. Negative for suicidal ideas.    Objective:  BP 119/79 (BP Location: Left Arm, Patient Position: Sitting, Cuff Size: Small)   Pulse 76   Temp  98.6 F (37 C) (Oral)   Ht 5\' 2"  (1.575 m)   Wt 146 lb 12.8 oz (66.6 kg)   LMP 10/04/2016   SpO2 98%   BMI 26.85 kg/m   BP/Weight 10/07/2016 09/18/2016 [id number]  Systolic BP [id number]  Diastolic BP 79 80 77  Wt. (Lbs) 146.8 142 142  BMI 26.85 25.97 25.97   Physical Exam  Constitutional: She is oriented to person, place, and time. She appears well-developed and well-nourished. No distress.  HENT:  Head: Normocephalic and atraumatic.  Cardiovascular: Normal rate, regular rhythm, normal heart sounds and intact distal pulses.   Pulmonary/Chest: Effort normal and breath sounds normal.  Musculoskeletal: She exhibits no edema.       Right knee: Normal.       Right ankle: She exhibits swelling.       Left ankle: She exhibits swelling.       Feet:  Neurological: She is alert and oriented to person, place, and time.  Skin: Skin is warm and dry. No rash noted.  Psychiatric: She has a normal mood and affect.    Depression screen [facility] 2/9 10/07/2016 09/18/2016 08/21/2016  Decreased Interest 0 0 0  Down, Depressed, Hopeless 0 0 0  PHQ - 2 Score 0 0 0  Altered sleeping 1 - -  Tired, decreased energy 0 - -  Change in appetite 0 - -  Feeling bad or failure about yourself  0 - -  Trouble concentrating 0 - -  Moving slowly or fidgety/restless 0 - -  Suicidal thoughts 0 - -  PHQ-9 Score 1 - -   GAD 7 : Generalized Anxiety Score 10/07/2016 08/15/2016 02/12/2016  Nervous, Anxious, on Edge 0 1 1  Control/stop worrying 0 0 1  Worry too much - different things 0 0 1  Trouble relaxing 0 0 0  Restless 0 0 0  Easily annoyed or irritable 1 0 1  Afraid - awful might happen 0 0 1  Total GAD 7 Score 1 1 5      Assessment & Plan:  Alicia Miller was seen today for ankle pain.  Diagnoses and all orders for this visit:  Post-nasal drip -     fexofenadine (ALLEGRA ALLERGY) 180 MG tablet; Take 1 tablet (180 mg total) by mouth daily.   There are no diagnoses linked to this encounter.  No orders of  the defined types were placed in this encounter.   Follow-up: No Follow-up on file.   Boykin Nearing MD

## 2016-10-07 NOTE — Assessment & Plan Note (Signed)
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

## 2016-10-07 NOTE — Patient Instructions (Addendum)
Alicia Miller was seen today for ankle pain.  Diagnoses and all orders for this visit:  Post-nasal drip -     fexofenadine (ALLEGRA ALLERGY) 180 MG tablet; Take 1 tablet (180 mg total) by mouth daily.  Chronic pain of left ankle  Chronic pain of right ankle  Joint laxity of right knee  Vitamin D insufficiency    F/u in 6 weeks for post nasal drip  Dr. Adrian Blackwater

## 2016-10-16 ENCOUNTER — Encounter: Payer: Self-pay | Admitting: Sports Medicine

## 2016-10-16 ENCOUNTER — Ambulatory Visit (INDEPENDENT_AMBULATORY_CARE_PROVIDER_SITE_OTHER): Payer: Self-pay | Admitting: Sports Medicine

## 2016-10-16 VITALS — BP 120/65 | HR 85 | Ht 62.0 in | Wt 146.0 lb

## 2016-10-16 DIAGNOSIS — M25572 Pain in left ankle and joints of left foot: Secondary | ICD-10-CM

## 2016-10-16 DIAGNOSIS — M25571 Pain in right ankle and joints of right foot: Secondary | ICD-10-CM

## 2016-10-16 DIAGNOSIS — G8929 Other chronic pain: Secondary | ICD-10-CM

## 2016-10-16 NOTE — Progress Notes (Signed)
   Subjective:    Patient ID: Alicia Miller, female    DOB: 06-Feb-1976, 40 y.o.   MRN: [id number]  HPI   Patient comes in today for follow-up on bilateral ankle pain and right knee pain. All areas have improved with physical therapy. She has been discharged to a home exercise program. She no longer needs to wear an ankle brace. She is not needing to take her naproxen sodium. She is wearing her green sports insoles with arch support and finds them to be comfortable. She states that she has a good understanding of her home exercises. Please note that the history is obtained with the help of an interpreter today.    Review of Systems     Objective:   Physical Exam  Well-developed, well-nourished. No acute distress  Examination of the right knee shows full range of motion. There is no effusion. No tenderness to palpation. Good stability.  Examination of both ankles show full range of motion. No effusion. Good ankle stability.  She is neurovascularly intact distally. Walks without a limp.      Assessment & Plan:   Improved bilateral ankle and right knee pain secondary to pes planus and generalized lower extremity weakness  Patient will continue to wear her inserts when active. She understands the importance of continuing with her home exercises. She also understands that she can take her naproxen sodium on an as-needed basis. I've reassured her that her x-rays are unremarkable. She may continue with activity as tolerated and follow-up with me as needed.

## 2016-11-06 ENCOUNTER — Ambulatory Visit: Payer: Self-pay

## 2016-11-07 ENCOUNTER — Encounter: Payer: Self-pay | Admitting: Family Medicine

## 2016-11-07 ENCOUNTER — Ambulatory Visit: Payer: Self-pay | Attending: Family Medicine | Admitting: Family Medicine

## 2016-11-07 VITALS — BP 101/66 | HR 76 | Temp 98.5°F | Ht 62.0 in | Wt 147.2 lb

## 2016-11-07 DIAGNOSIS — M545 Low back pain: Secondary | ICD-10-CM | POA: Insufficient documentation

## 2016-11-07 DIAGNOSIS — Z9049 Acquired absence of other specified parts of digestive tract: Secondary | ICD-10-CM | POA: Insufficient documentation

## 2016-11-07 DIAGNOSIS — R200 Anesthesia of skin: Secondary | ICD-10-CM | POA: Insufficient documentation

## 2016-11-07 DIAGNOSIS — K219 Gastro-esophageal reflux disease without esophagitis: Secondary | ICD-10-CM | POA: Insufficient documentation

## 2016-11-07 DIAGNOSIS — R110 Nausea: Secondary | ICD-10-CM | POA: Insufficient documentation

## 2016-11-07 DIAGNOSIS — R140 Abdominal distension (gaseous): Secondary | ICD-10-CM | POA: Insufficient documentation

## 2016-11-07 DIAGNOSIS — R1031 Right lower quadrant pain: Secondary | ICD-10-CM | POA: Insufficient documentation

## 2016-11-07 LAB — CBC
HCT: 43.2 % (ref 35.0–45.0)
Hemoglobin: 14.9 g/dL (ref 11.7–15.5)
MCH: 28.9 pg (ref 27.0–33.0)
MCHC: 34.5 g/dL (ref 32.0–36.0)
MCV: 83.9 fL (ref 80.0–100.0)
MPV: 9.8 fL (ref 7.5–12.5)
Platelets: 344 10*3/uL (ref 140–400)
RBC: 5.15 MIL/uL — ABNORMAL HIGH (ref 3.80–5.10)
RDW: 13.3 % (ref 11.0–15.0)
WBC: 6.9 10*3/uL (ref 3.8–10.8)

## 2016-11-07 LAB — POCT URINALYSIS DIPSTICK
Bilirubin, UA: NEGATIVE
Glucose, UA: NEGATIVE
Ketones, UA: NEGATIVE
Leukocytes, UA: NEGATIVE
Nitrite, UA: NEGATIVE
Protein, UA: NEGATIVE
Spec Grav, UA: 1.02
Urobilinogen, UA: 0.2
pH, UA: 6

## 2016-11-07 LAB — POCT URINE PREGNANCY: Preg Test, Ur: NEGATIVE

## 2016-11-07 MED ORDER — OMEPRAZOLE 20 MG PO CPDR: 20.0000 mg | DELAYED_RELEASE_CAPSULE | Freq: Every day | ORAL | 2 refills | Status: DC

## 2016-11-07 MED FILL — OMEPRAZOLE DR 20 MG CAPSULE: 20 | 30 days supply | Qty: 30 | Fill #0

## 2016-11-07 NOTE — Assessment & Plan Note (Signed)
Restart PPI

## 2016-11-07 NOTE — Progress Notes (Signed)
Subjective:  Patient ID: Alicia Miller, female    DOB: Nov 22, 1975  Age: 40 y.o. MRN: [id number]  CC: Abdominal Pain   HPI Crystalann Devendorf has hx of AIS pap 09/2015, confirmed on colposcopy 11/2015, s/p LEEP with negative margins 12/2015, repeat pap normal with HPV negative 07/2016. She presents for    1. R sided abdominal pain: 3-4 weeks had strong right lower abdominal pain. Pain lasted for 10-15 minutes.  Pain was associated with nausea, no emesis. No fever or chills. Pain was exacerbated by eating. She also had bloating.  She also had R lower back pain and numbness. she is s/p cholecystectomy in 05/2015.   Social History  Substance Use Topics  . Smoking status: Never Smoker  . Smokeless tobacco: Never Used  . Alcohol use No   Past Surgical History:  Procedure Laterality Date  . CHOLECYSTECTOMY  05/2015  . NO PAST SURGERIES      Outpatient Medications Prior to Visit  Medication Sig Dispense Refill  . fexofenadine (ALLEGRA ALLERGY) 180 MG tablet Take 1 tablet (180 mg total) by mouth daily. 30 tablet 2  . naproxen (NAPROSYN) 500 MG tablet Take one tab twice a day with food as needed for pain 60 tablet 0  . Vitamin D, Ergocalciferol, (DRISDOL) 50000 units CAPS capsule Take 1 capsule (50,000 Units total) by mouth every 30 (thirty) days. 12 capsule 0  . Multiple Vitamins-Minerals (MULTIVITAMIN WITH MINERALS) tablet Take 1 tablet by mouth daily. Reported on 01/19/2016     No facility-administered medications prior to visit.     ROS Review of Systems  Constitutional: Negative for chills and fever.  Eyes: Negative for visual disturbance.  Respiratory: Negative for shortness of breath.   Cardiovascular: Negative for chest pain.  Gastrointestinal: Positive for abdominal distention, abdominal pain and nausea. Negative for anal bleeding, blood in stool, constipation, diarrhea, rectal pain and vomiting.  Musculoskeletal: Positive for back pain. Negative for arthralgias.  Skin:  Negative for rash.  Allergic/Immunologic: Negative for immunocompromised state.  Hematological: Negative for adenopathy. Does not bruise/bleed easily.  Psychiatric/Behavioral: Negative for dysphoric mood and suicidal ideas.    Objective:  BP 101/66 (BP Location: Left Arm, Patient Position: Sitting, Cuff Size: Small)   Pulse 76   Temp 98.5 F (36.9 C) (Oral)   Ht 5\' 2"  (1.575 m)   Wt 147 lb 3.2 oz (66.8 kg)   LMP 10/05/2016   SpO2 97%   BMI 26.92 kg/m   BP/Weight 11/07/2016 10/16/2016 [id number]  Systolic BP 99991111 [id number] 123456  Diastolic BP 66 65 79  Wt. (Lbs) 147.2 146 146.8  BMI 26.92 26.7 26.85    Physical Exam  Constitutional: She is oriented to person, place, and time. She appears well-developed and well-nourished. No distress.  HENT:  Head: Normocephalic and atraumatic.  Cardiovascular: Normal rate, regular rhythm, normal heart sounds and intact distal pulses.   Pulmonary/Chest: Effort normal and breath sounds normal.  Abdominal: Soft. Bowel sounds are normal. She exhibits no distension and no mass. There is tenderness in the right lower quadrant and left lower quadrant. There is no rebound and no guarding.  Musculoskeletal: She exhibits no edema.  Neurological: She is alert and oriented to person, place, and time.  Skin: Skin is warm and dry. No rash noted.  Psychiatric: She has a normal mood and affect.   U preg:  UA: moderate RBC, otherwise normal   Assessment & Plan:  Relia was seen today for abdominal pain.  Diagnoses and all orders for this  visit:  Right lower quadrant abdominal pain -     Urinalysis Dipstick -     POCT urine pregnancy -     CT Abdomen Pelvis W Contrast; Future -     COMPLETE METABOLIC PANEL WITH GFR -     CBC -     Lipase  Laryngopharyngeal reflux -     omeprazole (PRILOSEC) 20 MG capsule; Take 1 capsule (20 mg total) by mouth daily.   There are no diagnoses linked to this encounter.  No orders of the defined types were placed in this  encounter.   Follow-up: Return in about 4 weeks (around 12/05/2016) for abdominal pain and GERD .   Boykin Nearing MD

## 2016-11-07 NOTE — Assessment & Plan Note (Signed)
RLQ pain 3 weeks ago DDx: urethral stone, inflammation of appendix, IBS

## 2016-11-07 NOTE — Progress Notes (Signed)
Pt is here today for right side abdominal pain. Pt states that she is in no pain today. Pt states that she is having discomfort above gallstone surgery scar.

## 2016-11-07 NOTE — Patient Instructions (Addendum)
Alicia Miller was seen today for abdominal pain.  Diagnoses and all orders for this visit:  Right lower quadrant abdominal pain -     Urinalysis Dipstick -     POCT urine pregnancy -     CT Abdomen Pelvis W Contrast; Future -     COMPLETE METABOLIC PANEL WITH GFR -     CBC -     Lipase  Laryngopharyngeal reflux -     omeprazole (PRILOSEC) 20 MG capsule; Take 1 capsule (20 mg total) by mouth daily.   There was moderate blood on your UA concerning for possible kidney stone, be sure to take plenty of water. If you have pain again take antiinflammatory, ibuprofen or naproxen Getting CT scan of abdomen to check on stones and appendix.  If testing is normal, IBS is most likely.  Cut out gas producing food fist, see below.   F/u in 4 weeks for abdominal pain   Dr. Adrian Blackwater   Recommend dietary changes for possible IBS with diarrhea:  1. Exclude foods that increase flatulence (eg, beans, onions, celery, carrots, raisins, bananas, apricots, prunes, Brussels sprouts, wheat germ, pretzels, and bagels), alcohol, and caffeine.  Have symptoms improved?   If yes, continue to excluded above  If no,  2. Exclude lactose   Have symptoms improved?  If yes, continue to exclude 1 and 2.  If no,  3. Exclude FODMAP Characteristics and sources of common FODMAPs  F Fermentable    O Oligosaccharides Fructans, galacto-oligosaccharides Wheat, barley, rye, onion, leek, white part of spring onion, garlic, shallots, artichokes, beetroot, fennel, peas, chicory, pistachio, cashews, legumes, lentils, and chickpeas  D Disaccharides Lactose Milk, custard, ice cream, and yogurt  M Monosaccharides "Free fructose" (fructose in excess of glucose) Apples, pears, mangoes, cherries, watermelon, asparagus, sugar snap peas, honey, high-fructose corn syrup  A And  P Polyols Sorbitol, mannitol, maltitol, and xylitol Apples, pears, apricots, cherries, nectarines, peaches, plums, watermelon, mushrooms, cauliflower,  artificially sweetened chewing gum and confectionery   Be sure to drink plenty of fluids to keep up with water loses from diarrhea

## 2016-11-08 LAB — COMPLETE METABOLIC PANEL WITH GFR
ALT: 12 U/L (ref 6–29)
AST: 14 U/L (ref 10–30)
Albumin: 4.7 g/dL (ref 3.6–5.1)
Alkaline Phosphatase: 51 U/L (ref 33–115)
BUN: 13 mg/dL (ref 7–25)
CO2: 20 mmol/L (ref 20–31)
Calcium: 9.8 mg/dL (ref 8.6–10.2)
Chloride: 103 mmol/L (ref 98–110)
Creat: 0.7 mg/dL (ref 0.50–1.10)
GFR, Est African American: >89 mL/min (ref >=60)
GFR, Est Non African American: >89 mL/min (ref >=60)
Glucose, Bld: 98 mg/dL (ref 65–99)
Potassium: 4.2 mmol/L (ref 3.5–5.3)
Sodium: 142 mmol/L (ref 135–146)
Total Bilirubin: 0.4 mg/dL (ref 0.2–1.2)
Total Protein: 7.7 g/dL (ref 6.1–8.1)

## 2016-11-08 LAB — LIPASE: Lipase: 46 U/L (ref 7–60)

## 2016-11-13 ENCOUNTER — Telehealth: Payer: Self-pay

## 2016-11-13 ENCOUNTER — Telehealth: Payer: Self-pay | Admitting: Family Medicine

## 2016-11-13 NOTE — Telephone Encounter (Signed)
Patient is aware of lab results.

## 2016-11-13 NOTE — Telephone Encounter (Signed)
Pt was called [id number]) a VM was left informing pt to return phone call for lab results. If pt returns call inform her that all labs were normal, and liver, kidney function is normal.

## 2016-11-14 ENCOUNTER — Encounter (HOSPITAL_COMMUNITY): Payer: Self-pay

## 2016-11-14 ENCOUNTER — Ambulatory Visit (HOSPITAL_COMMUNITY)
Admission: RE | Admit: 2016-11-14 | Discharge: 2016-11-14 | Disposition: A | Discharge status: Home / Self Care | Payer: Self-pay | Source: Ambulatory Visit | Attending: Family Medicine | Admitting: Family Medicine

## 2016-11-14 DIAGNOSIS — R1031 Right lower quadrant pain: Secondary | ICD-10-CM | POA: Insufficient documentation

## 2016-11-14 MED ORDER — IOPAMIDOL (ISOVUE-300) INJECTION 61%
INTRAVENOUS | Status: AC
  Administered 2016-11-14: 100 mL
  Filled 2016-11-14: qty 100

## 2016-11-15 ENCOUNTER — Ambulatory Visit: Payer: Self-pay | Attending: Family Medicine | Admitting: *Deleted

## 2016-11-15 VITALS — BP 110/75 | HR 83 | Temp 98.6°F | Resp 20

## 2016-11-15 DIAGNOSIS — R319 Hematuria, unspecified: Secondary | ICD-10-CM | POA: Insufficient documentation

## 2016-11-15 DIAGNOSIS — R350 Frequency of micturition: Secondary | ICD-10-CM

## 2016-11-15 DIAGNOSIS — N390 Urinary tract infection, site not specified: Secondary | ICD-10-CM | POA: Insufficient documentation

## 2016-11-15 LAB — POCT URINALYSIS DIPSTICK
Bilirubin, UA: NEGATIVE
Glucose, UA: 100
Protein, UA: 30
Spec Grav, UA: 1.015
Urobilinogen, UA: 1
pH, UA: 7

## 2016-11-15 MED ORDER — SULFAMETHOXAZOLE-TRIMETHOPRIM 800-160 MG PO TABS: 1.0000 | ORAL_TABLET | Freq: Two times a day (BID) | ORAL | 0 refills | Status: AC

## 2016-11-15 MED FILL — SULFAMETHOXAZOLE-TMP DS TAB: 800-160 | 7 days supply | Qty: 14 | Fill #0

## 2016-11-15 NOTE — Progress Notes (Signed)
Spanish interpreter Doroteo Bradford [phone] translated during nurse visit.  Pt states sx's onset Wednesday with dysuria, urinary frequency. States a lot of urine does not come out when she goes.  Sx's got worse last night.  Pt informed of CT results per Dr. Adrian Blackwater. Encourage to keep f/u apt as discussed at last OV. Pt verbalized understanding.

## 2016-11-17 LAB — URINE CULTURE

## 2016-11-20 ENCOUNTER — Other Ambulatory Visit: Payer: Self-pay | Admitting: Internal Medicine

## 2016-11-21 ENCOUNTER — Ambulatory Visit: Payer: Self-pay | Attending: Family Medicine

## 2016-11-21 NOTE — Telephone Encounter (Signed)
Pt. Came into facility stating she came in to leave a urine sample and was giving medication. Pt. Was told by the nurse that she was going to send out the culture and if the results came out different her medication would change. Pt. Was told she could view her results on MyChart and pt. States she is not able to get into her chart. Pt. Would like her results. Please f/u

## 2016-11-21 NOTE — Telephone Encounter (Signed)
No results for pt at this time.

## 2016-11-27 ENCOUNTER — Telehealth: Payer: Self-pay

## 2016-11-27 NOTE — Telephone Encounter (Signed)
Pt was called and a VM was left informing pt to return phone call for lab results. If pt call back please inform pt of:  Non obstructing right renal stone  Otherwise normal CT abdomen and pelvis

## 2016-12-10 ENCOUNTER — Ambulatory Visit: Payer: Self-pay | Attending: Family Medicine | Admitting: Family Medicine

## 2016-12-10 ENCOUNTER — Encounter: Payer: Self-pay | Admitting: Family Medicine

## 2016-12-10 ENCOUNTER — Other Ambulatory Visit: Payer: Self-pay

## 2016-12-10 VITALS — BP 111/72 | HR 77 | Temp 97.7°F | Wt 146.0 lb

## 2016-12-10 DIAGNOSIS — J029 Acute pharyngitis, unspecified: Secondary | ICD-10-CM | POA: Insufficient documentation

## 2016-12-10 DIAGNOSIS — A048 Other specified bacterial intestinal infections: Secondary | ICD-10-CM

## 2016-12-10 DIAGNOSIS — J385 Laryngeal spasm: Secondary | ICD-10-CM

## 2016-12-10 DIAGNOSIS — K219 Gastro-esophageal reflux disease without esophagitis: Secondary | ICD-10-CM | POA: Insufficient documentation

## 2016-12-10 DIAGNOSIS — R0600 Dyspnea, unspecified: Secondary | ICD-10-CM

## 2016-12-10 DIAGNOSIS — Z9049 Acquired absence of other specified parts of digestive tract: Secondary | ICD-10-CM | POA: Insufficient documentation

## 2016-12-10 DIAGNOSIS — R0609 Other forms of dyspnea: Secondary | ICD-10-CM | POA: Insufficient documentation

## 2016-12-10 HISTORY — DX: Laryngeal spasm: J38.5

## 2016-12-10 MED ORDER — PANTOPRAZOLE SODIUM 40 MG PO TBEC: 40.0000 mg | DELAYED_RELEASE_TABLET | Freq: Every day | ORAL | 3 refills | Status: DC

## 2016-12-10 MED FILL — ?PANTOPRAZOLE SOD DR 40MG: 40 MG | 30 days supply | Qty: 30 | Fill #0

## 2016-12-10 NOTE — Patient Instructions (Addendum)
Alicia Miller was seen today for abdominal pain.  Diagnoses and all orders for this visit:  Laryngopharyngeal reflux -     H. pylori breath test -     Ambulatory referral to ENT -     pantoprazole (PROTONIX) 40 MG tablet; Take 1 tablet (40 mg total) by mouth at bedtime. -     Pulmonary Function Test; Future  Dyspnea on exertion -     EKG 12-Lead -     Cancel: DG Chest 2 View; Future -     Cancel: Pulmonary Function Test; Future -     Pulmonary Function Test; Future    I suspect laryngospasm. You have been referred back to ENT for laryngoscopy.   PFTs to rule out asthma but less is unlikely.  No anemia EKG normal   Checking for H. Pylori  Change to protonix which is a strong proton pump inhibitor  F/u in 6 weeks for laryngospasm   Dr. Adrian Blackwater

## 2016-12-10 NOTE — Assessment & Plan Note (Signed)
Patient present with signs and symptoms of laryngospasm. She she has long standing GERD-like dysphagia symptoms. She has new onset dyspnea with recent exercise attempt.  Normal EKG.  Plan: protonix Referral back to ENT for laryngoscopy  PFT to rule out restrictive lung disease, unlikely Reassurance and patient education

## 2016-12-10 NOTE — Progress Notes (Signed)
Subjective:  Patient ID: Alicia Miller, female    DOB: 1976-01-24  Age: 41 y.o. MRN: [id number] spanish interpreter used  CC: Abdominal Pain   HPI Alicia Miller has hx of AIS pap 09/2015, confirmed on colposcopy 11/2015, s/p LEEP with negative margins 12/2015, repeat pap normal with HPV negative 07/2016. She presents for    1. R sided abdominal pain: resolved. CT abdomen with small renal stone only. She had moderate UTI. She took partial bactrim course. She stopped due to swelling and itching in hands. No dysuria.   2. Sore throat: for past yea and a half off an on. She has diagnosis of GERD. She has been evaluated by ENT. She was prescribed omeprazole. She reports that the omprazole has not helped. She feel phlegm in her throat day and night. The symptom is worse at night. She has choking sensation at night.    3. Shortness of breath: with exertion. She recently started working out. She tried jogging. She felt that herer lungs are tight when she runs. She felt choking sensation in throat. She has no history of asthma. No anemia.  No family history of CAD. She does not usually exercise. She denies chest pain.   Social History  Substance Use Topics  . Smoking status: Never Smoker  . Smokeless tobacco: Never Used  . Alcohol use No   Past Surgical History:  Procedure Laterality Date  . CHOLECYSTECTOMY  05/2015    Outpatient Medications Prior to Visit  Medication Sig Dispense Refill  . Multiple Vitamins-Minerals (MULTIVITAMIN WITH MINERALS) tablet Take 1 tablet by mouth daily. Reported on 01/19/2016    . naproxen (NAPROSYN) 500 MG tablet Take one tab twice a day with food as needed for pain 60 tablet 0  . omeprazole (PRILOSEC) 20 MG capsule Take 1 capsule (20 mg total) by mouth daily. 30 capsule 2  . Vitamin D, Ergocalciferol, (DRISDOL) 50000 units CAPS capsule Take 1 capsule (50,000 Units total) by mouth every 30 (thirty) days. 12 capsule 0   No facility-administered  medications prior to visit.     ROS Review of Systems  Constitutional: Negative for chills and fever.  HENT: Positive for sore throat.   Eyes: Negative for visual disturbance.  Respiratory: Negative for shortness of breath.   Cardiovascular: Negative for chest pain.  Gastrointestinal: Negative for abdominal distention, abdominal pain, anal bleeding, blood in stool, constipation, diarrhea, nausea, rectal pain and vomiting.  Musculoskeletal: Positive for back pain. Negative for arthralgias.  Skin: Negative for rash.  Allergic/Immunologic: Negative for immunocompromised state.  Hematological: Negative for adenopathy. Does not bruise/bleed easily.  Psychiatric/Behavioral: Negative for dysphoric mood and suicidal ideas.    Objective:  BP 111/72 (BP Location: Left Arm, Patient Position: Sitting, Cuff Size: Small)   Pulse 77   Temp 97.7 F (36.5 C) (Oral)   Wt 146 lb (66.2 kg)   LMP 12/09/2016   SpO2 98%   BMI 26.70 kg/m   BP/Weight 12/10/2016 11/15/2016 [id number]  Systolic BP 99991111 [id number] 99991111  Diastolic BP 72 75 66  Wt. (Lbs) 146 - 147.2  BMI 26.7 - 26.92    Physical Exam  Constitutional: She is oriented to person, place, and time. She appears well-developed and well-nourished. No distress.  HENT:  Head: Normocephalic and atraumatic.  Cardiovascular: Normal rate, regular rhythm, normal heart sounds and intact distal pulses.   Pulmonary/Chest: Effort normal and breath sounds normal.  Abdominal: Soft. Bowel sounds are normal. She exhibits no distension and no mass. There is tenderness in  the right lower quadrant and left lower quadrant. There is no rebound and no guarding.  Musculoskeletal: She exhibits no edema.  Neurological: She is alert and oriented to person, place, and time.  Skin: Skin is warm and dry. No rash noted.  Psychiatric: She has a normal mood and affect.    EKG: normal EKG,  Assessment & Plan:  Alicia Miller was seen today for abdominal pain.  Diagnoses and all orders  for this visit:  Laryngopharyngeal reflux -     H. pylori breath test -     Ambulatory referral to ENT -     pantoprazole (PROTONIX) 40 MG tablet; Take 1 tablet (40 mg total) by mouth at bedtime. -     Pulmonary Function Test; Future  Dyspnea on exertion -     EKG 12-Lead -     Cancel: DG Chest 2 View; Future -     Cancel: Pulmonary Function Test; Future -     Pulmonary Function Test; Future   There are no diagnoses linked to this encounter.  No orders of the defined types were placed in this encounter.   Follow-up: Return in about 6 weeks (around 01/21/2017) for laryngospasm .   Boykin Nearing MD

## 2016-12-11 LAB — H. PYLORI BREATH TEST: H. pylori Breath Test: DETECTED — AB

## 2016-12-12 DIAGNOSIS — A048 Other specified bacterial intestinal infections: Secondary | ICD-10-CM | POA: Insufficient documentation

## 2016-12-12 MED ORDER — AMOXICILLIN 500 MG PO CAPS: 1000.0000 mg | ORAL_CAPSULE | Freq: Two times a day (BID) | ORAL | 0 refills | Status: AC

## 2016-12-12 MED ORDER — CLARITHROMYCIN 500 MG PO TABS: 500.0000 mg | ORAL_TABLET | Freq: Two times a day (BID) | ORAL | 0 refills | Status: AC

## 2016-12-12 MED ORDER — PANTOPRAZOLE SODIUM 40 MG PO TBEC: 40.0000 mg | DELAYED_RELEASE_TABLET | Freq: Two times a day (BID) | ORAL | 0 refills | Status: DC

## 2016-12-12 NOTE — Addendum Note (Signed)
Addended by: Boykin Nearing on: 12/12/2016 02:23 PM   Modules accepted: Orders

## 2016-12-12 NOTE — Assessment & Plan Note (Signed)
Positive H pylori Treated with 14 day course of triple therapy Clarithromycin 500 mg BID Amoxicillin 1000 mg BID Protonix 40 mg BID

## 2016-12-16 ENCOUNTER — Telehealth: Payer: Self-pay | Admitting: Family Medicine

## 2016-12-16 ENCOUNTER — Telehealth: Payer: Self-pay

## 2016-12-16 ENCOUNTER — Ambulatory Visit (HOSPITAL_COMMUNITY)
Admission: RE | Admit: 2016-12-16 | Discharge: 2016-12-16 | Disposition: A | Discharge status: Home / Self Care | Payer: Self-pay | Source: Ambulatory Visit | Attending: Family Medicine | Admitting: Family Medicine

## 2016-12-16 DIAGNOSIS — K219 Gastro-esophageal reflux disease without esophagitis: Secondary | ICD-10-CM | POA: Insufficient documentation

## 2016-12-16 DIAGNOSIS — R0609 Other forms of dyspnea: Secondary | ICD-10-CM | POA: Insufficient documentation

## 2016-12-16 DIAGNOSIS — R0600 Dyspnea, unspecified: Secondary | ICD-10-CM

## 2016-12-16 LAB — PULMONARY FUNCTION TEST
DL/VA % pred: 116 %
DL/VA: 5.34 ml/min/mmHg/L
DLCO unc % pred: 103 %
DLCO unc: 23.12 ml/min/mmHg
FEF 25-75 Post: 2.32 L/sec
FEF 25-75 Pre: 2.69 L/sec
FEF2575-%Change-Post: -13 %
FEF2575-%Pred-Post: 75 %
FEF2575-%Pred-Pre: 87 %
FEV1-%Change-Post: -4 %
FEV1-%Pred-Post: 91 %
FEV1-%Pred-Pre: 96 %
FEV1-Post: 2.65 L
FEV1-Pre: 2.78 L
FEV1FVC-%Change-Post: 3 %
FEV1FVC-%Pred-Pre: 96 %
FEV6-%Change-Post: -9 %
FEV6-%Pred-Post: 91 %
FEV6-%Pred-Pre: 100 %
FEV6-Post: 3.17 L
FEV6-Pre: 3.49 L
FEV6FVC-%Pred-Post: 102 %
FEV6FVC-%Pred-Pre: 102 %
FVC-%Change-Post: -7 %
FVC-%Pred-Post: 91 %
FVC-%Pred-Pre: 99 %
FVC-Post: 3.22 L
FVC-Pre: 3.49 L
Post FEV1/FVC ratio: 82 %
Post FEV6/FVC ratio: 100 %
Pre FEV1/FVC ratio: 80 %
Pre FEV6/FVC Ratio: 100 %
RV % pred: 166 %
RV: 2.5 L
TLC % pred: 117 %
TLC: 5.66 L

## 2016-12-16 MED ORDER — ALBUTEROL SULFATE (2.5 MG/3ML) 0.083% IN NEBU
2.5000 mg | INHALATION_SOLUTION | Freq: Once | RESPIRATORY_TRACT | Status: AC
  Administered 2016-12-16: 2.5 mg via RESPIRATORY_TRACT

## 2016-12-16 NOTE — Telephone Encounter (Signed)
Pt called , pt is aware of medication at pharmacy will start taking rx.

## 2016-12-16 NOTE — Telephone Encounter (Signed)
Pt was called and informed of positive h.pylori and medication that were sent over to pharmacy.

## 2016-12-17 ENCOUNTER — Telehealth: Payer: Self-pay | Admitting: Family Medicine

## 2016-12-17 NOTE — Telephone Encounter (Signed)
-----   Message from Boykin Nearing, MD sent at 12/16/2016  1:25 PM EST ----- Very minimal airway obstruction on PFTs Continue with plan to see ENT Treat H. Pylori

## 2016-12-17 NOTE — Telephone Encounter (Signed)
Medical Assistant used [employer] to contact patient.  Interpreter Name: Oley Balm Interpreter #: [id number] Patient was not available, [employer] left patient a voicemail. Voicemail states to give a call back to [place] with [facility] at [phone].

## 2016-12-17 NOTE — Telephone Encounter (Signed)
Medical Assistant used [employer] to contact patient.  Interpreter Name: Arnette Norris #: [id number] Patient verified DOB Patient is aware of PFT showing very minimal airway obstruction. Patient is aware of keeping the plan with visiting an ENT. Patient is aware of [facility] scheduling the appointment since the patient has the orange card. Patient is also advised to continue with treatment of H Pylori. Patient expressed her understanding and had no further questions at this time.

## 2016-12-17 NOTE — Telephone Encounter (Signed)
Patient returning call to nurse  

## 2016-12-18 ENCOUNTER — Telehealth: Payer: Self-pay

## 2016-12-18 NOTE — Telephone Encounter (Signed)
Pt was called and a VM was left informing pt to return phone call for lab results. 

## 2016-12-18 NOTE — Telephone Encounter (Signed)
Pt returned phone call and Venetia Night informed pt of lab results.

## 2016-12-20 ENCOUNTER — Telehealth: Payer: Self-pay | Admitting: Family Medicine

## 2016-12-20 NOTE — Telephone Encounter (Signed)
Will route to PCP 

## 2016-12-20 NOTE — Telephone Encounter (Signed)
Patient on triple therapy for H. Pylori She is advised to stop therapy

## 2016-12-20 NOTE — Telephone Encounter (Signed)
Patient called stating that she feels like her medications are causing her to have headaches, colic, and dry mouth with a very bad taste in her mouth. Please f/u

## 2016-12-26 NOTE — Telephone Encounter (Signed)
Patient called to find out the status of the called that she plaved on 12/20/16 regarding the reaction that she is having to the medicine. Pt also stated that she received a call from the pharmacy stating that her prescription is ready for pick. Pt still has medication left and is not sure she has to continue with them especially is she's having this reaction. Please follow up.  Thank you.

## 2017-01-01 NOTE — Telephone Encounter (Signed)
Pt was called and informed to stop taking the medication for her H.pylori.

## 2017-01-10 ENCOUNTER — Encounter: Payer: Self-pay | Admitting: Family Medicine

## 2017-01-10 ENCOUNTER — Ambulatory Visit: Payer: Self-pay | Attending: Family Medicine | Admitting: Family Medicine

## 2017-01-10 VITALS — BP 120/71 | HR 83 | Temp 98.2°F | Ht 62.0 in | Wt 142.0 lb

## 2017-01-10 DIAGNOSIS — R87810 Cervical high risk human papillomavirus (HPV) DNA test positive: Secondary | ICD-10-CM

## 2017-01-10 DIAGNOSIS — A048 Other specified bacterial intestinal infections: Secondary | ICD-10-CM

## 2017-01-10 DIAGNOSIS — Z9049 Acquired absence of other specified parts of digestive tract: Secondary | ICD-10-CM | POA: Insufficient documentation

## 2017-01-10 DIAGNOSIS — B9681 Helicobacter pylori [H. pylori] as the cause of diseases classified elsewhere: Secondary | ICD-10-CM | POA: Insufficient documentation

## 2017-01-10 DIAGNOSIS — D069 Carcinoma in situ of cervix, unspecified: Secondary | ICD-10-CM

## 2017-01-10 DIAGNOSIS — K219 Gastro-esophageal reflux disease without esophagitis: Secondary | ICD-10-CM | POA: Insufficient documentation

## 2017-01-10 DIAGNOSIS — J385 Laryngeal spasm: Secondary | ICD-10-CM | POA: Insufficient documentation

## 2017-01-10 MED ORDER — PANTOPRAZOLE SODIUM 40 MG PO TBEC: 40.0000 mg | DELAYED_RELEASE_TABLET | Freq: Every day | ORAL | 5 refills | Status: DC

## 2017-01-10 MED FILL — ?PANTOPRAZOLE SOD DR 40MG: 40 MG | 30 days supply | Qty: 30 | Fill #0

## 2017-01-10 NOTE — Patient Instructions (Addendum)
Diagnoses and all orders for this visit:  Laryngopharyngeal reflux -     pantoprazole (PROTONIX) 40 MG tablet; Take 1 tablet (40 mg total) by mouth daily.  Positive H. pylori test -     pantoprazole (PROTONIX) 40 MG tablet; Take 1 tablet (40 mg total) by mouth daily. -     H. pylori breath test; Future   F/u the week of March 5-9 for repeat H pylori breath test. Lab visit. You will be called with results.    F/u with me in 3 months sooner if needed   Dr. Adrian Blackwater   Opciones de alimentos para pacientes con reflujo gastroesofgico - Adultos (Food Choices for Gastroesophageal Reflux Disease, Adult) Cuando se tiene reflujo gastroesofgico (ERGE), los alimentos que se ingieren y los hbitos de alimentacin son Theatre stage manager. Elegir los alimentos adecuados puede ayudar a [employer]. [place]?  Elija las frutas, los vegetales, los cereales integrales y los productos lcteos con bajo contenido de [place].  [place], de pescado y de ave con bajo contenido de grasas.  Limite las grasas, como los [place], los aderezos para [place], la [address], los frutos secos y Publishing copy.  Lleve un registro de alimentos. Esto ayuda a identificar los alimentos que ocasionan sntomas.  Evite los alimentos que le ocasionen sntomas. Pueden ser distintos para cada persona.  Haga comidas pequeas durante Psychiatrist de 3 comidas abundantes.  Coma lentamente, en un lugar donde est distendido.  Limite el consumo de alimentos fritos.  Cocine los alimentos utilizando mtodos que no sean la fritura.  Evite el consumo alcohol.  Evite beber grandes cantidades de lquidos con las comidas.  Evite agacharse o recostarse hasta despus de 2 o 3horas de haber comido. QU ALIMENTOS NO SE RECOMIENDAN? Estos son algunos alimentos y bebidas que pueden empeorar los sntomas: Actor. Jugo de tomate. Salsa de tomate y espagueti. Ajes. [place] y [place]. Rbano  picante. Frutas  Naranjas, pomelos y limn (fruta y [place]). Carnes  Carnes de [place], de pescado y de ave con gran contenido de grasas. Esto incluye los perros calientes, las [place], el [place], la salchicha, el salame y el tocino. Lcteos  Leche entera y [place]. [employer]. Crema. [place]. Helados. Queso crema. Bebidas  T o caf. Bebidas gaseosas o bebidas energizantes. Condimentos  Salsa picante. Salsa barbacoa. Dulces/postres  Chocolate y cacao. [place]. Menta y mentol. Grasas y [employer]. Esto incluye las papas fritas. Otros  [place]. Especias picantes. Esto incluye la pimienta negra, la pimienta blanca, la pimienta roja, la pimienta de cayena, el curry en polvo, los clavos de [place], el jengibre y el [place] en polvo. Esta no es [employer] de los alimentos y las bebidas que se Higher education careers adviser. Comunquese con el nutricionista para recibir ms informacin.  Esta informacin no tiene Marine scientist el consejo del mdico. Asegrese de hacerle al mdico cualquier pregunta que tenga. Document Released: 05/05/2012 Document Revised: 11/25/2014 Document Reviewed: 09/08/2013 Elsevier Interactive Patient Education  2017 Elsevier Inc.  Infeccin por Helicobacter Pylori (Helicobacter Pylori Infection) La infeccin por Helicobacter pylori es una infeccin en el estmago que es causada por la bacteria Helicobacter pylori (H. pylori). Este tipo de bacteria vive frecuentemente en el revestimiento del estmago. La infeccin puede causar lceras e irritacin (gastritis) en algunas personas. Es la causa ms comn de lceras en el estmago (lcera gstrica) y en la parte superior del intestino (lcera duodenal). [employer]  infeccin tambin puede aumentar el riesgo de cncer de [place] y un tipo de cncer de los glbulos blancos (linfoma) que afecta al [place]. CAUSAS H. pylori es un tipo de bacteria que se encuentra frecuentemente en el estmago de las  personas saludables. La bacteria puede pasar de Ardelia Mems persona a otra por contacto a travs de las heces o la saliva. No se sabe por qu algunas personas desarrollan lceras, gastritis o cncer a partir de la infeccin. FACTORES DE RIESGO Es ms probable que esta afeccin se manifieste en las personas que:  Tienen familiares con esta infeccin.  Viven con [employer]; por ejemplo, en un dormitorio.  Son de origen africano, hispano o [place] con esta infeccin no tienen sntomas. Si tiene sntomas, estos pueden incluir los siguientes:  Merchant navy officer.  Dolor de [place].  Nuseas.  Vmitos.  Sangre en el vmito.  Prdida del apetito.  Mal aliento. DIAGNSTICO Esta afeccin se puede diagnosticar en funcin de los sntomas, un examen fsico y Pharmacist, community. Podrn solicitarle otros estudios, por ejemplo:  Anlisis de sangre o pruebas de materia fecal para verificar las protenas (anticuerpos) que el cuerpo puede producir en respuesta a las bacterias. Estas pruebas son la mejor manera de confirmar el diagnstico.  Ardelia Mems prueba de aliento para verificar el tipo de gas que la bacteria H. pylori libera despus de descomponer una sustancia llamada urea. Para la prueba, se le pide que beba urea. Esta prueba se realiza frecuentemente despus del tratamiento para saber si el tratamiento funcion.  Un procedimiento en el que un tubo delgado y flexible con una pequea cmara en el extremo se coloca en el estmago y el intestino superior (endoscopia superior). El mdico tambin puede tomar muestras de tejido (biopsia) para Optometrist pruebas de H. pylori y cncer. TRATAMIENTO El tratamiento para esta afeccin generalmente implica tomar una combinacin de medicamentos (terapia triple) durante un par de semanas. La terapia triple incluye un medicamento para reducir el cido en el estmago y dos tipos de antibiticos. Se aprobaron muchas combinaciones  de frmacos para el tratamiento. El tratamiento generalmente mata a la H. pylori y reduce el riesgo de cncer. Despus del tratamiento, es posible que deba volver a realizarse una prueba de H. pylori. En algunos casos, es posible que sea necesario repetir Dispensing optician. [place] los medicamentos de venta libre y los recetados solamente como se lo haya indicado el mdico.  Tome los antibiticos como se lo haya indicado el mdico. No deje de tomar los antibiticos aunque comience a sentirse mejor.  Puede retomar todas sus actividades habituales y Clinical biochemist su dieta habitual.  Tome estas medidas para evitar infecciones futuras:  Lvese las manos con frecuencia.  Asegrese de que los alimentos que consume se hayan preparado adecuadamente.  Beba agua solamente de fuentes limpias.  Concurra a todas las visitas de control como se lo haya indicado el mdico. Esto es importante. SOLICITE ATENCIN MDICA SI:  Los sntomas no mejoran.  Los sntomas regresan despus del tratamiento. Esta informacin no tiene Marine scientist el consejo del mdico. Asegrese de hacerle al mdico cualquier pregunta que tenga. Document Released: 02/26/2016 Document Revised: 02/26/2016 Document Reviewed: 11/16/2014 Elsevier Interactive Patient Education  2017 [employer].

## 2017-01-10 NOTE — Progress Notes (Signed)
Subjective:  Patient ID: Alicia Miller, female    DOB: [birth year]  Age: 41 y.o. MRN: [id number] spanish interpreter used  CC: No chief complaint on file.   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 She presents for    1. H. Pylori: she completed triple therapy although she had headache, sour taste in mouth and some GI upset with regimen. Her GERD has improved significantly. She still has intermittent symptoms. She completed therapy last week.    2. Laryngospasm: she had an episode of choking and feeling her throat closing up while at the dentist. She has continued to exercise but not jogging or strenuous exercise which has reduced her symptoms with exercise. She has completed PFTs which revealed mild obstructive pattern. She is still awaiting ENT appt for laryngoscopy.   Social History  Substance Use Topics  . Smoking status: Never Smoker  . Smokeless tobacco: Never Used  . Alcohol use No   Past Surgical History:  Procedure Laterality Date  . CHOLECYSTECTOMY  05/2015    Outpatient Medications Prior to Visit  Medication Sig Dispense Refill  . Multiple Vitamins-Minerals (MULTIVITAMIN WITH MINERALS) tablet Take 1 tablet by mouth daily. Reported on 01/19/2016    . pantoprazole (PROTONIX) 40 MG tablet Take 1 tablet (40 mg total) by mouth 2 (two) times daily. 28 tablet 0   No facility-administered medications prior to visit.     ROS Review of Systems  Constitutional: Negative for chills and fever.  HENT: Positive for sore throat.   Eyes: Negative for visual disturbance.  Respiratory: Negative for shortness of breath.   Cardiovascular: Negative for chest pain.  Gastrointestinal: Negative for abdominal distention, abdominal pain, anal bleeding, blood in stool, constipation, diarrhea, nausea, rectal pain and vomiting.  Musculoskeletal: Negative for  arthralgias.  Skin: Negative for rash.  Allergic/Immunologic: Negative for immunocompromised state.  Hematological: Negative for adenopathy. Does not bruise/bleed easily.  Psychiatric/Behavioral: Negative for dysphoric mood and suicidal ideas.    Objective:  BP 120/71 (BP Location: Left Arm, Patient Position: Sitting, Cuff Size: Small)   Pulse 83   Temp 98.2 F (36.8 C) (Oral)   Ht 5\' 2"  (1.575 m)   Wt 142 lb (64.4 kg)   LMP 01/07/2017   SpO2 97%   BMI 25.97 kg/m   BP/Weight 01/10/2017 12/10/2016 [id number]  Systolic BP [id number]  Diastolic BP 71 72 75  Wt. (Lbs) 142 146 -  BMI 25.97 26.7 -    Physical Exam  Constitutional: She is oriented to person, place, and time. She appears well-developed and well-nourished. No distress.  HENT:  Head: Normocephalic and atraumatic.  Cardiovascular: Normal rate, regular rhythm, normal heart sounds and intact distal pulses.   Pulmonary/Chest: Effort normal and breath sounds normal.  Abdominal: Soft. Bowel sounds are normal. She exhibits no distension and no mass. There is no rebound.  Musculoskeletal: She exhibits no edema.  Neurological: She is alert and oriented to person, place, and time.  Skin: Skin is warm and dry. No rash noted.  Psychiatric: She has a normal mood and affect.    EKG: normal EKG,  Assessment & Plan:  Alicia Miller was seen today for gastroesophageal reflux.  Diagnoses and all orders for this visit:  Laryngopharyngeal reflux -     pantoprazole (PROTONIX) 40 MG tablet; Take 1 tablet (40 mg total) by mouth daily.  Positive H. pylori test -  pantoprazole (PROTONIX) 40 MG tablet; Take 1 tablet (40 mg total) by mouth daily. -     H. pylori breath test; Future   There are no diagnoses linked to this encounter.  No orders of the defined types were placed in this encounter.   Follow-up: Return in about 3 months (around 04/09/2017) for gerd .   Boykin Nearing MD

## 2017-01-12 ENCOUNTER — Encounter: Payer: Self-pay | Admitting: Family Medicine

## 2017-01-12 NOTE — Assessment & Plan Note (Addendum)
Laryngospasm triggered by GERD,exertion and anxiety Improved with less symptoms since treating H. Pylori  Plan: Continue protonix 40 mg daily  Still plan for ENT visit for direct laryngoscopy

## 2017-01-12 NOTE — Assessment & Plan Note (Signed)
Repeat pap smear normal

## 2017-01-12 NOTE — Assessment & Plan Note (Signed)
Improved following triple therapy Plan Continue protonix reduced to 40 mg daily GERD diet Test of cure next week

## 2017-01-24 ENCOUNTER — Ambulatory Visit: Payer: Self-pay | Attending: Family Medicine

## 2017-01-24 DIAGNOSIS — A048 Other specified bacterial intestinal infections: Secondary | ICD-10-CM | POA: Insufficient documentation

## 2017-01-27 LAB — H. PYLORI BREATH TEST: H. pylori Breath Test: DETECTED — AB

## 2017-01-29 ENCOUNTER — Telehealth: Payer: Self-pay

## 2017-01-29 NOTE — Telephone Encounter (Signed)
Pt was called and informed of lab results and to continue medication.

## 2017-01-30 ENCOUNTER — Other Ambulatory Visit (HOSPITAL_COMMUNITY): Payer: Self-pay | Admitting: *Deleted

## 2017-01-30 ENCOUNTER — Ambulatory Visit (HOSPITAL_COMMUNITY)
Admission: RE | Admit: 2017-01-30 | Discharge: 2017-01-30 | Disposition: A | Discharge status: Home / Self Care | Payer: Self-pay | Source: Ambulatory Visit | Attending: Obstetrics and Gynecology | Admitting: Obstetrics and Gynecology

## 2017-01-30 ENCOUNTER — Encounter (HOSPITAL_COMMUNITY): Payer: Self-pay

## 2017-01-30 VITALS — BP 108/68 | Temp 98.6°F | Ht 61.0 in | Wt 143.0 lb

## 2017-01-30 DIAGNOSIS — Z01419 Encounter for gynecological examination (general) (routine) without abnormal findings: Secondary | ICD-10-CM

## 2017-01-30 NOTE — Progress Notes (Signed)
Patient complained of right breast periareolar mass that there has been no change since since Diagnostic mammogram and ultrasound completed 10/26/2015. Results were benign and screening mammogram recommended at age 41 unless persistent or intervening clinical concerns.   Pap Smear: Pap smear completed today. Last Pap smear was 07/25/2016 at [facility] and normal with negative HPV. 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 A CKC was completed for follow up 12/01/2015. Patient also has a history of a Pap smear 10/06/2014 that was normal and positive for HPV. A colposcopy was completed to follow up 11/03/2014. The above results are in EPIC.  Pelvic/Bimanual   Ext Genitalia No lesions, no swelling and no discharge observed on external genitalia.         Vagina Vagina pink and normal texture. No lesions or discharge observed in vagina.          Cervix Cervix is present. Cervix pink and of normal texture. Cervix friable. No discharge observed.     Uterus Uterus is present and palpable. Uterus in normal position and normal size.        Adnexae Bilateral ovaries present and palpable. No tenderness on palpation.          Rectovaginal No rectal exam completed today since patient had no rectal complaints. No skin abnormalities observed on exam.    Smoking History: Patient has never smoked.  Patient Navigation: Patient education provided. Access to services provided for patient through [facility] program. Spanish interpreter provided.  Used Spanish interpreter Retta Mac from [employer].

## 2017-01-30 NOTE — Patient Instructions (Signed)
Explained to  Alicia Miller that her next Pap smear will be due in 6 months due to her history of a cervical cancer if today's Pap smear is normal. Reminded patient that she will need a screening mammogram in October. Let patient know will follow up with her within the next couple weeks with results of Pap smear by phone. Brittain Cenobio-Marquez verbalized understanding.  Vale Mousseau, [name], RN 8:32 AM

## 2017-01-31 ENCOUNTER — Encounter (HOSPITAL_COMMUNITY): Payer: Self-pay | Admitting: *Deleted

## 2017-02-03 LAB — CYTOLOGY - PAP
Diagnosis: NEGATIVE
HPV: NOT DETECTED

## 2017-02-05 ENCOUNTER — Ambulatory Visit: Payer: Self-pay | Attending: Family Medicine

## 2017-02-17 ENCOUNTER — Encounter (HOSPITAL_COMMUNITY): Payer: Self-pay | Admitting: *Deleted

## 2017-02-17 NOTE — Progress Notes (Signed)
Letter mailed to patient about pap smear results. Unable to reach patient by phone.

## 2017-04-02 ENCOUNTER — Encounter: Payer: Self-pay | Admitting: Family Medicine

## 2017-04-10 ENCOUNTER — Ambulatory Visit: Payer: Self-pay | Attending: Family Medicine | Admitting: Family Medicine

## 2017-04-10 ENCOUNTER — Encounter: Payer: Self-pay | Admitting: Family Medicine

## 2017-04-10 VITALS — BP 98/66 | HR 79 | Temp 98.2°F | Wt 140.4 lb

## 2017-04-10 DIAGNOSIS — J301 Allergic rhinitis due to pollen: Secondary | ICD-10-CM

## 2017-04-10 DIAGNOSIS — H5712 Ocular pain, left eye: Secondary | ICD-10-CM | POA: Insufficient documentation

## 2017-04-10 DIAGNOSIS — I8390 Asymptomatic varicose veins of unspecified lower extremity: Secondary | ICD-10-CM | POA: Insufficient documentation

## 2017-04-10 DIAGNOSIS — K219 Gastro-esophageal reflux disease without esophagitis: Secondary | ICD-10-CM | POA: Insufficient documentation

## 2017-04-10 DIAGNOSIS — J302 Other seasonal allergic rhinitis: Secondary | ICD-10-CM | POA: Insufficient documentation

## 2017-04-10 DIAGNOSIS — I83892 Varicose veins of left lower extremities with other complications: Secondary | ICD-10-CM | POA: Insufficient documentation

## 2017-04-10 DIAGNOSIS — B079 Viral wart, unspecified: Secondary | ICD-10-CM

## 2017-04-10 DIAGNOSIS — Z9049 Acquired absence of other specified parts of digestive tract: Secondary | ICD-10-CM | POA: Insufficient documentation

## 2017-04-10 MED ORDER — MEDICAL COMPRESSION STOCKINGS MISC: 0 refills | Status: DC

## 2017-04-10 MED ORDER — SUMATRIPTAN 20 MG/ACT NA SOLN: NASAL | 0 refills | Status: DC

## 2017-04-10 MED ORDER — PANTOPRAZOLE SODIUM 20 MG PO TBEC: 20.0000 mg | DELAYED_RELEASE_TABLET | Freq: Every day | ORAL | 5 refills | Status: DC

## 2017-04-10 MED ORDER — LORATADINE 10 MG PO TABS: 10.0000 mg | ORAL_TABLET | Freq: Every day | ORAL | 5 refills | Status: DC

## 2017-04-10 MED FILL — PANTOPRAZOLE SOD DR 20 MG T: 20 | 30 days supply | Qty: 30 | Fill #0

## 2017-04-10 NOTE — Assessment & Plan Note (Signed)
Improved Plan Protonix 20 mg before supper

## 2017-04-10 NOTE — Progress Notes (Signed)
Subjective:  Patient ID: Alicia Miller, female    DOB: 1976-04-17  Age: 41 y.o. 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 She presents for    1. H. Pylori: she completed triple therapy although she had headache, sour taste in mouth and some GI upset with regimen. Her GERD has improved significantly. She still has intermittent symptoms. She completed triple therapy.   2. Laryngospasm: episodes are very mild. She experiences them daily and the symptoms are worse at night when she is lying down. She feels like there is phlegm in her throat. She has been able to get the dental work done.  She take Protonix 40 mg as needed.  She has continued to exercise but not jogging or strenuous exercise which has reduced her symptoms with exercise. She has completed PFTs which revealed mild obstructive pattern. She is still awaiting ENT appt for laryngoscopy via orange.   3. Itching: in throat (tonsils and palates). Also reports itching around lips and cheeks for the past 3 weeks. No redness or rash. She has history of allergy of pollen. Not taking antihistamine.   4. L eye pain: sharp pains. Unsure of when they started but has occurred on 5 occasions. Associated with tearing from L eye. No trauma, no or redness in eye. No vision changes. Symptoms last for 1-2 minutes.  5. Varicose vein: L leg. Slightly tender. No skin changes. Worsen around menses.   6. Scalp wart: R sided. Painless. Bothersome simply because she does not like it. Would like it removed.   Social History  Substance Use Topics  . Smoking status: Never Smoker  . Smokeless tobacco: Never Used  . Alcohol use No   Past Surgical History:  Procedure Laterality Date  . CHOLECYSTECTOMY  05/2015    Outpatient  Medications Prior to Visit  Medication Sig Dispense Refill  . Multiple Vitamins-Minerals (MULTIVITAMIN WITH MINERALS) tablet Take 1 tablet by mouth daily. Reported on 01/19/2016    . pantoprazole (PROTONIX) 40 MG tablet Take 1 tablet (40 mg total) by mouth daily. 30 tablet 5   No facility-administered medications prior to visit.     ROS Review of Systems  Constitutional: Negative for chills and fever.  HENT: Negative for sore throat.   Eyes: Positive for pain and discharge. Negative for photophobia, redness, itching and visual disturbance.  Respiratory: Negative for shortness of breath.   Cardiovascular: Negative for chest pain.  Gastrointestinal: Negative for abdominal distention, abdominal pain, anal bleeding, blood in stool, constipation, diarrhea, nausea, rectal pain and vomiting.  Musculoskeletal: Negative for arthralgias.  Skin: Negative for rash.  Allergic/Immunologic: Negative for immunocompromised state.  Hematological: Negative for adenopathy. Does not bruise/bleed easily.  Psychiatric/Behavioral: Negative for dysphoric mood and suicidal ideas.    Objective:  BP 98/66   Pulse 79   Temp 98.2 F (36.8 C) (Oral)   Wt 140 lb 6.4 oz (63.7 kg)   LMP 04/06/2017   SpO2 97%   BMI 26.53 kg/m    BP/Weight 04/10/2017 01/30/2017 7/82/4235  Systolic BP 98 361 443  Diastolic BP 66 68 71  Wt. (Lbs) 140.4 143 142  BMI 26.53 27.02 25.97    Physical Exam  Constitutional: She is oriented to person, place, and time. She appears well-developed and well-nourished. No distress.  HENT:  Head: Normocephalic and atraumatic.  Eyes: Conjunctivae and EOM are normal. Pupils are equal, round, and reactive to light.    Cardiovascular: Normal rate, regular rhythm, normal heart sounds and intact distal pulses.   Pulmonary/Chest: Effort normal and breath sounds normal.  Abdominal: Soft. Bowel sounds are normal. She exhibits no distension and no mass. There is no rebound.  Musculoskeletal:  She exhibits no edema.       Legs: Neurological: She is alert and oriented to person, place, and time.  Skin: Skin is warm and dry. No rash noted.  Psychiatric: She has a normal mood and affect.   Vision screen 20/25 both eyes   Assessment & Plan:  Alicia Miller was seen today for gastroesophageal reflux.  Diagnoses and all orders for this visit:  Pain in left eye -     Cancel: Ambulatory referral to Optometry -     SUMAtriptan (IMITREX) 20 MG/ACT nasal spray; Spray once into L nares for cluster headache. May repeat in 2 hours if headache persists or recurs. Max 2 doses in 7 day period.  Laryngopharyngeal reflux -     pantoprazole (PROTONIX) 20 MG tablet; Take 1 tablet (20 mg total) by mouth daily with supper.  Varicose vein of leg -     Elastic Bandages & Supports (MEDICAL COMPRESSION STOCKINGS) MISC; Knee high compression stockings 25-30 mm Hg pressure wear daily  Seasonal allergic rhinitis due to pollen -     loratadine (CLARITIN) 10 MG tablet; Take 1 tablet (10 mg total) by mouth daily.   There are no diagnoses linked to this encounter.  No orders of the defined types were placed in this encounter.   Follow-up: No Follow-up on file.   Boykin Nearing MD

## 2017-04-10 NOTE — Assessment & Plan Note (Signed)
Will remove at follow up

## 2017-04-10 NOTE — Patient Instructions (Addendum)
Alicia Miller was seen today for gastroesophageal reflux.  Diagnoses and all orders for this visit:  Pain in left eye -     Cancel: Ambulatory referral to Optometry -     SUMAtriptan (IMITREX) 20 MG/ACT nasal spray; Spray once into L nares for cluster headache. May repeat in 2 hours if headache persists or recurs. Max 2 doses in 7 day period.  Laryngopharyngeal reflux -     pantoprazole (PROTONIX) 20 MG tablet; Take 1 tablet (20 mg total) by mouth daily with supper.  Varicose vein of leg -     Elastic Bandages & Supports (MEDICAL COMPRESSION STOCKINGS) MISC; Knee high compression stockings 25-30 mm Hg pressure wear daily  Seasonal allergic rhinitis due to pollen -     loratadine (CLARITIN) 10 MG tablet; Take 1 tablet (10 mg total) by mouth daily.   I suspect pain and tearing in L eye is a cluster headache. Vision and eye exam is normal. Please keep a log of pain symptoms, how often and how long they last.   F/u in 2 weeks for L eye pain and removal of wart from R scalp  Dr. Ricke Hey en brotes (Cluster Headache) La cefalea en brotes es un dolor de cabeza muy intenso. Normalmente se produce en un lado de la cabeza, pero puede cambiar de lado. Con frecuencia, las cefaleas en brotes:  Son intensas.  Pueden ocurrir con Production manager o meses y Personal assistant por un tiempo.  Duran entre 15 minutos y 3 horas.  Comienzan [employer] a la misma hora.  Suceden a la noche.  Ocurren varias veces por da. CUIDADOS EN EL HOGAR En la poca en que aparecen las cefaleas en brotes:  Duerma la misma cantidad de horas todas las noches y el mismo tiempo cada noche.  Evite el alcohol.  Si fuma, abandone el hbito. SOLICITE AYUDA SI:  Hay cambios en la intensidad o en la frecuencia con que aparece el dolor.  Los medicamentos no lo ayudan. SOLICITE AYUDA DE INMEDIATO SI:  Pierde el conocimiento (se desmaya).  Se siente dbil o pierde la sensibilidad  (tiene adormecimiento) en un lado del cuerpo o el rostro.  Ve dos imgenes de un mismo objeto (visin doble).  Tiene Higher education careers adviser (nuseas) o devuelve (vomita), y esto contina durante varias horas.  No puede mantener el equilibrio o tiene dificultad para hablar o caminar.  Siente dolor o rigidez en el cuello.  Tiene fiebre. ASEGRESE DE QUE:  Comprende estas instrucciones.  Controlar su afeccin.  Recibir ayuda de inmediato si no mejora o si empeora. Esta informacin no tiene Marine scientist el consejo del mdico. Asegrese de hacerle al mdico cualquier pregunta que tenga. Document Released: 04/08/2011 Document Revised: 11/25/2014 Document Reviewed: 04/30/2016 Elsevier Interactive Patient Education  2017 [employer] are on the list for the ENT  Dr. Adrian Blackwater

## 2017-04-10 NOTE — Assessment & Plan Note (Signed)
Suspect cluster headache Exam normal Plan: Information Headache diary internasal Imitrex prn persistent headache

## 2017-04-24 ENCOUNTER — Ambulatory Visit: Payer: Self-pay | Attending: Family Medicine | Admitting: Family Medicine

## 2017-04-24 ENCOUNTER — Encounter: Payer: Self-pay | Admitting: Family Medicine

## 2017-04-24 VITALS — BP 102/65 | HR 76 | Temp 98.1°F | Wt 139.6 lb

## 2017-04-24 DIAGNOSIS — Q825 Congenital non-neoplastic nevus: Secondary | ICD-10-CM

## 2017-04-24 DIAGNOSIS — H5712 Ocular pain, left eye: Secondary | ICD-10-CM

## 2017-04-24 DIAGNOSIS — Z9049 Acquired absence of other specified parts of digestive tract: Secondary | ICD-10-CM | POA: Insufficient documentation

## 2017-04-24 DIAGNOSIS — D224 Melanocytic nevi of scalp and neck: Secondary | ICD-10-CM | POA: Insufficient documentation

## 2017-04-24 DIAGNOSIS — B079 Viral wart, unspecified: Secondary | ICD-10-CM

## 2017-04-24 NOTE — Progress Notes (Signed)
Subjective:  Patient ID: Alicia Miller, female    DOB: [birth year]  Age: 41 y.o. MRN: [id number] [employer] Spanish interpreter used Alicia Miller ID # [id number] CC: Verrucous Vulgaris  HPI Alicia Miller has hx of AIS pap 09/2015, confirmed on colposcopy 11/2015, s/p LEEP with negative margins 12/2015, repeat pap normal with HPV negative 07/2016. She presents for     1. L eye pain: sharp pains. Unsure of when they started but has occurred on 5 occasions. Associated with tearing from L eye. No trauma, no or redness in eye. No vision changes. Symptoms last for 1-2 minutes.  She reports since last visit. She still has sharp pains but rare. Still last 1-2 minutes. Pains occur less often. She has not taking intranasal sumatriptan.   2. Scalp wart: R sided. Painless. Enlarging. Presents today for removal.   Social History  Substance Use Topics  . Smoking status: Never Smoker  . Smokeless tobacco: Never Used  . Alcohol use No   Past Surgical History:  Procedure Laterality Date  . CHOLECYSTECTOMY  05/2015    Outpatient Medications Prior to Visit  Medication Sig Dispense Refill  . Elastic Bandages & Supports (MEDICAL COMPRESSION STOCKINGS) MISC Knee high compression stockings 25-30 mm Hg pressure wear daily 2 each 0  . loratadine (CLARITIN) 10 MG tablet Take 1 tablet (10 mg total) by mouth daily. 30 tablet 5  . Multiple Vitamins-Minerals (MULTIVITAMIN WITH MINERALS) tablet Take 1 tablet by mouth daily. Reported on 01/19/2016    . pantoprazole (PROTONIX) 20 MG tablet Take 1 tablet (20 mg total) by mouth daily with supper. 30 tablet 5  . SUMAtriptan (IMITREX) 20 MG/ACT nasal spray Spray once into L nares for cluster headache. May repeat in 2 hours if headache persists or recurs. Max 2 doses in 7 day period. 1 Inhaler 0   No facility-administered medications prior to visit.     ROS Review of Systems  Constitutional: Negative for chills and fever.  HENT: Negative for sore throat.   Eyes:  Positive for pain and discharge. Negative for photophobia, redness, itching and visual disturbance.  Respiratory: Negative for shortness of breath.   Cardiovascular: Negative for chest pain.  Gastrointestinal: Negative for abdominal distention, abdominal pain, anal bleeding, blood in stool, constipation, diarrhea, nausea, rectal pain and vomiting.  Musculoskeletal: Negative for arthralgias.  Skin: Negative for rash.  Allergic/Immunologic: Negative for immunocompromised state.  Hematological: Negative for adenopathy. Does not bruise/bleed easily.  Psychiatric/Behavioral: Negative for dysphoric mood and suicidal ideas.    Objective:  BP 102/65   Pulse 76   Temp 98.1 F (36.7 C) (Oral)   Wt 139 lb 9.6 oz (63.3 kg)   LMP 04/06/2017   SpO2 98%   BMI 26.38 kg/m   BP/Weight 04/24/2017 04/10/2017 2/87/6811  Systolic BP 572 98 620  Diastolic BP 65 66 68  Wt. (Lbs) 139.6 140.4 143  BMI 26.38 26.53 27.02    Physical Exam  Constitutional: She is oriented to person, place, and time. She appears well-developed and well-nourished. No distress.  HENT:  Head: Normocephalic and atraumatic.    Eyes: Conjunctivae and EOM are normal. Pupils are equal, round, and reactive to light.    Cardiovascular: Normal rate, regular rhythm, normal heart sounds and intact distal pulses.   Pulmonary/Chest: Effort normal and breath sounds normal.  Abdominal: Soft. Bowel sounds are normal. She exhibits no distension and no mass. There is no rebound.  Musculoskeletal: She exhibits no edema.       Legs: Neurological: She is  alert and oriented to person, place, and time.  Skin: Skin is warm and dry. No rash noted.  Psychiatric: She has a normal mood and affect.   Wart Removal Procedure Note  Pre-operative Diagnosis: wart  Post-operative Diagnosis: wart Locations:right scalp  Indications: cosmetic  Anesthesia: Lidocaine 1% with epinephrine without added sodium bicarbonate  Procedure Details  The risks  (including bleeding and infection) and benefits of the procedure and Written informed consent obtained. Using sterile iris scissors, multiple skin tags were snipped off at their bases after cleansing with Betadine.  Bleeding was controlled by pressure.   Findings: Pathognomonic benign lesions. Patient requested pathology. Specimen sent.   Condition: Stable  Complications: none.  Plan: 1. Instructed to keep the wounds dry and covered for 24-48h and clean thereafter. 2. Warning signs of infection were reviewed.   3. Recommended that the patient use OTC analgesics as needed for pain.  4. Return as needed.  Assessment & Plan:  Alicia Miller was seen today for verrucous vulgaris.  Diagnoses and all orders for this visit:  Wart of scalp -     Dermatology pathology  Pain in left eye  Facial wart -     Ambulatory referral to Dermatology   There are no diagnoses linked to this encounter.  No orders of the defined types were placed in this encounter.   Follow-up: Return in about 3 months (around 07/25/2017).   Boykin Nearing MD

## 2017-04-24 NOTE — Patient Instructions (Addendum)
Diagnoses and all orders for this visit:  Wart of scalp  Pain in left eye  Facial wart -     Ambulatory referral to Dermatology   We are using [facility] new [facility] now. Please collect any labs bills and meet with one of our financial counselors.  Take ibuprofen or tylenol for any residual pain following wart removal.   Follow up in 3 months, sooner if needed   Dr. Adrian Blackwater  Escisin de lesiones en la piel, cuidados posteriores (Excision of Skin Lesions, Care After) Siga estas instrucciones durante las prximas semanas. Estas indicaciones le proporcionan informacin acerca de cmo deber cuidarse despus del procedimiento. El mdico tambin podr darle instrucciones ms especficas. El tratamiento ha sido planificado segn las prcticas mdicas actuales, pero en algunos casos pueden ocurrir problemas. Comunquese con el mdico si tiene algn problema o dudas despus del procedimiento. QU ESPERAR DESPUS DEL PROCEDIMIENTO Despus del procedimiento, es comn tener dolor o Chemical engineer de la escisin. [place] los medicamentos de venta libre y los recetados solamente como se lo haya indicado el mdico.  Siga las indicaciones del mdico acerca de lo siguiente: ? Cmo Government social research officer de la escisin. Debe mantener TEFL teacher limpio, seco y protegido durante al menos 48horas. ? Cmo y cundo cambiar las vendas (vendaje). ? Cundo retirar el vendaje. ? Quitar lo que sea que se haya usado para Energy manager de la escisin.  El [place] zona de la escisin para detectar signos de infeccin. Est atento a lo siguiente: ? Dolor, hinchazn o enrojecimiento. ? Lquido, sangre o pus.  En caso de hemorragia, presione la herida durante 41minutos de forma suave pero firme con una toalla doblada.  Evite las actividades y los ejercicios de alto impacto hasta que le quiten los puntos (suturas) o hasta que la zona cicatrice.  Siga las  indicaciones del mdico acerca de cmo minimizar la formacin de cicatrices. Evite la exposicin al sol hasta que el rea se haya cicatrizado. Las cicatrices deben reducirse con el transcurso del [place].  Concurra a todas las visitas de control como se lo haya indicado el mdico. Esto es importante. SOLICITE ATENCIN MDICA SI:  Alicia Miller.  Tiene enrojecimiento, hinchazn o Management consultant de la escisin.  Tiene lquido, sangre o pus que emanan del lugar de la escisin.  Tiene Magazine features editor de la escisin.  Tiene dolor, y este no mejora despus de 2 o 3das del procedimiento.  Observa irregularidades en la piel o cambios en la sensibilidad. Esta informacin no tiene Marine scientist el consejo del mdico. Asegrese de hacerle al mdico cualquier pregunta que tenga. Document Released: 07/26/2015 Document Revised: 07/26/2015 Document Reviewed: 12/21/2014 Elsevier Interactive Patient Education  2018 [place] en brotes (Cluster Headache) La cefalea en brotes es un dolor de cabeza muy intenso. Normalmente se produce en un lado de la cabeza, pero puede cambiar de lado. Con frecuencia, las cefaleas en brotes:  Son intensas.  Pueden ocurrir con Production manager o meses y Personal assistant por un tiempo.  Duran entre 15 minutos y 3 horas.  Comienzan [employer] a la misma hora.  Suceden a la noche.  Ocurren varias veces por da. CUIDADOS EN EL HOGAR En la poca en que aparecen las cefaleas en brotes:  Duerma la misma cantidad de horas todas las noches y el mismo tiempo cada noche.  Evite el alcohol.  Si fuma, abandone el hbito. SOLICITE AYUDA SI:  Hay cambios en la intensidad o en la frecuencia con que aparece el dolor.  Los medicamentos no lo ayudan.  SOLICITE AYUDA DE INMEDIATO SI:  Pierde el conocimiento (se desmaya).  Se siente dbil o pierde la sensibilidad (tiene adormecimiento) en un lado del cuerpo o  el rostro.  Ve dos imgenes de un mismo objeto (visin doble).  Tiene Higher education careers adviser (nuseas) o devuelve (vomita), y esto contina durante varias horas.  No puede mantener el equilibrio o tiene dificultad para hablar o caminar.  Siente dolor o rigidez en el cuello.  Tiene fiebre.  ASEGRESE DE QUE:  Comprende estas instrucciones.  Controlar su afeccin.  Recibir ayuda de inmediato si no mejora o si empeora.  Esta informacin no tiene Marine scientist el consejo del mdico. Asegrese de hacerle al mdico cualquier pregunta que tenga. Document Released: 04/08/2011 Document Revised: 11/25/2014 Document Reviewed: 04/30/2016 Elsevier Interactive Patient Education  2017 [employer].

## 2017-04-28 NOTE — Assessment & Plan Note (Signed)
Pathology confirms No atypia removed

## 2017-05-19 ENCOUNTER — Telehealth: Payer: Self-pay

## 2017-05-19 NOTE — Telephone Encounter (Signed)
Pt was called and informed of lab results. 

## 2017-05-30 ENCOUNTER — Ambulatory Visit: Payer: Self-pay | Attending: Family Medicine

## 2017-06-02 ENCOUNTER — Encounter (HOSPITAL_COMMUNITY): Payer: Self-pay | Admitting: *Deleted

## 2017-06-02 ENCOUNTER — Ambulatory Visit (HOSPITAL_COMMUNITY)
Admission: EM | Admit: 2017-06-02 | Discharge: 2017-06-02 | Disposition: A | Discharge status: Home / Self Care | Payer: Self-pay | Attending: Family Medicine | Admitting: Family Medicine

## 2017-06-02 DIAGNOSIS — R21 Rash and other nonspecific skin eruption: Secondary | ICD-10-CM

## 2017-06-02 MED ORDER — TRIAMCINOLONE ACETONIDE 0.1 % EX CREA: 1.0000 "application " | TOPICAL_CREAM | Freq: Two times a day (BID) | CUTANEOUS | 0 refills | Status: DC

## 2017-06-02 MED FILL — TRIAMCINOLONE 0.1% CREAM: 0.1 | 30 days supply | Qty: 30 | Fill #0

## 2017-06-02 NOTE — ED Triage Notes (Signed)
Rash   X   3   Weeks on  Extremities    And  Back            No  Known  Causative    Agent          Itches

## 2017-06-02 NOTE — ED Provider Notes (Addendum)
  [place]   [id number] 06/02/17 Arrival Time: [place] PLAN:  1. Rash and nonspecific skin eruption    Appear to be insect bites. No infection.  Meds ordered this encounter  Medications  . triamcinolone cream (KENALOG) 0.1 %    Sig: Apply 1 application topically 2 (two) times daily.    Dispense:  30 g    Refill:  0   Use for no more than one week. D/C instructions given verbally through interpreter.  Reviewed expectations re: course of current medical issues. Questions answered. Outlined signs and symptoms indicating need for more acute intervention. Patient verbalized understanding. After Visit Summary given.   SUBJECTIVE:  Alicia Miller is a 41 y.o. female who presents with complaint of itchy rash on arms and legs mainly. Some on lower back. For 3 weeks. First noticed on lower back then extremities. Afebrile. No self-treatment. Itching bothering her the most. Is outside frequently. No new exposures. No contacts with similar rash. No h/o similar. No specific aggravating or alleviating factors reported..  ROS: As per HPI.   OBJECTIVE:  Vitals:   06/02/17 1020  BP: 107/73  Pulse: 69  Resp: 16  Temp: 98.4 F (36.9 C)  TempSrc: Oral  SpO2: 100%     General appearance: alert; no distress Skin: discrete circular lightly erythematous and raised areas on low back and extremities. Approx 1cm in diameter. No confluence. Do blanch mostly. Non-tender   Allergies  Allergen Reactions  . Bactrim [Sulfamethoxazole-Trimethoprim] Itching    PMHx, SurgHx, SocialHx, Medications, and Allergies were reviewed in the Visit Navigator and updated as appropriate.      Vanessa Kick, MD 06/02/17 1106    Vanessa Kick, MD 06/02/17 [phone]

## 2017-06-12 ENCOUNTER — Encounter: Payer: Self-pay | Admitting: Family Medicine

## 2017-06-12 ENCOUNTER — Ambulatory Visit: Payer: Self-pay | Attending: Family Medicine | Admitting: Family Medicine

## 2017-06-12 VITALS — BP 102/64 | HR 74 | Temp 98.3°F | Ht 62.0 in | Wt 140.0 lb

## 2017-06-12 DIAGNOSIS — L298 Other pruritus: Secondary | ICD-10-CM

## 2017-06-12 DIAGNOSIS — K219 Gastro-esophageal reflux disease without esophagitis: Secondary | ICD-10-CM | POA: Insufficient documentation

## 2017-06-12 DIAGNOSIS — Q825 Congenital non-neoplastic nevus: Secondary | ICD-10-CM | POA: Insufficient documentation

## 2017-06-12 DIAGNOSIS — R921 Mammographic calcification found on diagnostic imaging of breast: Secondary | ICD-10-CM | POA: Insufficient documentation

## 2017-06-12 DIAGNOSIS — L821 Other seborrheic keratosis: Secondary | ICD-10-CM | POA: Insufficient documentation

## 2017-06-12 DIAGNOSIS — I8390 Asymptomatic varicose veins of unspecified lower extremity: Secondary | ICD-10-CM | POA: Insufficient documentation

## 2017-06-12 MED ORDER — TRIAMCINOLONE ACETONIDE 0.1 % EX CREA: 1.0000 "application " | TOPICAL_CREAM | Freq: Two times a day (BID) | CUTANEOUS | 0 refills | Status: DC

## 2017-06-12 NOTE — Patient Instructions (Addendum)
Alicia Miller was seen today for rash.  Diagnoses and all orders for this visit:  Varicose vein of leg -     Ambulatory referral to Vascular Surgery  Nevus comedonicus of face -     Cancel: Ambulatory referral to Dermatology -     Ambulatory referral to Dermatology  Seborrheic keratoses -     Cancel: Ambulatory referral to Dermatology  Pruritic erythematous rash -     triamcinolone cream (KENALOG) 0.1 %; Apply 1 application topically 2 (two) times daily.   Please wear compression stockings as much as possible Dermatology referral sent Papules on arm and leg is seborrheic keratosis   F/u in 3 months for flu shot and check up sooner if needed   Dr. Jola Schmidt seborreica (Seborrheic Keratosis) La queratosis seborreica es un trastorno frecuente y no canceroso (benigno) que se caracteriza por la presencia de tumores cutneos. Esta afeccin hace que aparezcan manchas cerosas, speras, marrones o negras en la piel. Estos tumores cutneos pueden ser planos o tener relieve. CAUSAS Se desconoce la causa de este trastorno. FACTORES DE RIESGO Es ms probable que esta afeccin se manifieste en:  Las personas que tienen antecedentes familiares de queratosis seborreica.  [place] [address]NIO.  Las [place].  Las personas que han recibido un tratamiento de reposicin con Therapist, occupational. SNTOMAS Esta afeccin a menudo se presenta en la cara, el pecho, los hombros, la espalda u otras reas. Estos tumores presentan las siguientes caractersticas:  Por lo general, son indoloros, pero pueden irritarse y Engineer, agricultural.  Pueden ser de [employer], [place], negro, o de otros colores.  Tienen un poco de relieve o son planos.  A veces, son speros o tienen una textura similar a la de Civil Service fast streamer.  A menudo, tienen un aspecto seroso en la superficie.  Son redondos u [place].  A veces, se ven como si estuviesen "pegados".  Suelen aparecer en grupos, pero pueden crecer de forma  individual. DIAGNSTICO Esta afeccin se diagnostica mediante la historia clnica y un examen fsico. Puede analizarse Truddie Coco de un tumor (biopsia de piel). Tal vez haya que consultar a un especialista en piel (dermatlogo). TRATAMIENTO Por lo general, esta afeccin no requiere tratamiento, excepto si los tumores se irritan o sangran con frecuencia. Si no le gusta el aspecto de estos tumores, puede solicitarle al mdico que se los extirpe. Con frecuencia, estos tumores se tratan mediante un procedimiento en el que se aplica nitrgeno lquido para "congelar" el tumor (criociruga). Tambin pueden quemarse con electricidad o extirparse. INSTRUCCIONES PARA EL CUIDADO EN EL HOGAR  Controle los tumores para ver si hay cambios.  Concurra a todas las visitas de control como se lo haya indicado el mdico. Esto es importante.  No se rasque ni toquetee el tumor o los tumores. Esto puede hacer que se irriten o infecten.  SOLICITE ATENCIN MDICA SI:  Le aparecen muchos tumores nuevos de forma repentina.  El tumor sangra, le pica o le duele.  El tumor se agranda o cambia de color de forma repentina.  Esta informacin no tiene Marine scientist el consejo del mdico. Asegrese de hacerle al mdico cualquier pregunta que tenga. Document Released: 07/17/2011 Document Revised: 07/26/2015 Document Reviewed: 03/22/2015 Elsevier Interactive Patient Education  2017 [employer].

## 2017-06-12 NOTE — Progress Notes (Signed)
Subjective:  Patient ID: Alicia Miller, female    DOB: 03/22/1976  Age: 41 y.o. MRN: [id number]  CC: Rash   HPI Alicia Miller has GERD, she is uninsured she  presents for   1. Rash: she was seen at [facility] on  06/02/17 with itching on arms, legs and low back for 3 weeks. Her exam revealed a slightly erythematous rash on back and extremities. She denies new foods, contact exposures or medications. She was prescribed triamcinolone 0.1 % cream. This helps. She found bed bugs at home. She is fumigating. She has thrown out her mattress and carpet.   2. Varicose veins: painful. Left leg. Not wearing compression stockings due to discomfort and heat.   3. Referral:  Derm- desires removal of mole from face. Also has papules on left upper arm and left thigh that is pruritic.  ENT- waiting to on ENT referral for laryngospasm.    Social History  Substance Use Topics  . Smoking status: Never Smoker  . Smokeless tobacco: Never Used  . Alcohol use No    Outpatient Medications Prior to Visit  Medication Sig Dispense Refill  . triamcinolone cream (KENALOG) 0.1 % Apply 1 application topically 2 (two) times daily. 30 g 0  . Elastic Bandages & Supports (MEDICAL COMPRESSION STOCKINGS) MISC Knee high compression stockings 25-30 mm Hg pressure wear daily (Patient not taking: Reported on 06/12/2017) 2 each 0  . Multiple Vitamins-Minerals (MULTIVITAMIN WITH MINERALS) tablet Take 1 tablet by mouth daily. Reported on 01/19/2016    . pantoprazole (PROTONIX) 20 MG tablet Take 1 tablet (20 mg total) by mouth daily with supper. 30 tablet 5  . SUMAtriptan (IMITREX) 20 MG/ACT nasal spray Spray once into L nares for cluster headache. May repeat in 2 hours if headache persists or recurs. Max 2 doses in 7 day period. (Patient not taking: Reported on 06/12/2017) 1 Inhaler 0   No facility-administered medications prior to visit.     ROS Review of Systems  Constitutional: Negative for  chills and fever.  Eyes: Negative for visual disturbance.  Respiratory: Negative for shortness of breath.   Cardiovascular: Negative for chest pain.  Gastrointestinal: Negative for abdominal pain and blood in stool.  Musculoskeletal: Negative for arthralgias and back pain.  Skin: Negative for rash.  Allergic/Immunologic: Negative for immunocompromised state.  Hematological: Negative for adenopathy. Does not bruise/bleed easily.  Psychiatric/Behavioral: Negative for dysphoric mood and suicidal ideas.    Objective:  BP 102/64   Pulse 74   Temp 98.3 F (36.8 C) (Oral)   Ht 5\' 2"  (1.575 m)   Wt 140 lb (63.5 kg)   LMP 05/30/2017   SpO2 98%   BMI 25.61 kg/m   BP/Weight 06/12/2017 01/31/4007 04/24/6194  Systolic BP 093 267 124  Diastolic BP 64 73 65  Wt. (Lbs) 140 - 139.6  BMI 25.61 - 26.38    Physical Exam  Constitutional: She is oriented to person, place, and time. She appears well-developed and well-nourished. No distress.  HENT:  Head: Normocephalic and atraumatic.    Cardiovascular: Normal rate, regular rhythm, normal heart sounds and intact distal pulses.   Pulmonary/Chest: Effort normal and breath sounds normal.  Musculoskeletal: She exhibits no edema.  Neurological: She is alert and oriented to person, place, and time.  Skin: Skin is warm and dry. No rash noted.     Psychiatric: She has a normal mood and affect.     Assessment & Plan:  Alicia Miller was seen today for rash.  Diagnoses and all orders for this visit:  Varicose vein of leg -     Ambulatory referral to Vascular Surgery  Nevus comedonicus of face -     Cancel: Ambulatory referral to Dermatology -     Ambulatory referral to Dermatology  Seborrheic keratoses -     Cancel: Ambulatory referral to Dermatology  Pruritic erythematous rash -     triamcinolone cream (KENALOG) 0.1 %; Apply 1 application topically 2 (two) times daily.   There are no diagnoses linked to this encounter.  No orders of the  defined types were placed in this encounter.   Follow-up: Return in about 3 months (around 09/12/2017) for check up and flu shot.   Boykin Nearing MD

## 2017-06-24 ENCOUNTER — Encounter: Payer: Self-pay | Admitting: Vascular Surgery

## 2017-06-30 ENCOUNTER — Other Ambulatory Visit: Payer: Self-pay | Admitting: Obstetrics and Gynecology

## 2017-06-30 DIAGNOSIS — Z1231 Encounter for screening mammogram for malignant neoplasm of breast: Secondary | ICD-10-CM

## 2017-06-30 MED FILL — TRIAMCINOLONE 0.1% CREAM: 0.1 | 10 days supply | Qty: 30 | Fill #0

## 2017-07-02 ENCOUNTER — Encounter: Payer: Self-pay | Admitting: Vascular Surgery

## 2017-07-04 ENCOUNTER — Encounter: Payer: Self-pay | Admitting: Vascular Surgery

## 2017-07-07 ENCOUNTER — Encounter: Payer: Self-pay | Admitting: Vascular Surgery

## 2017-07-07 ENCOUNTER — Ambulatory Visit (INDEPENDENT_AMBULATORY_CARE_PROVIDER_SITE_OTHER): Payer: No Typology Code available for payment source | Admitting: Vascular Surgery

## 2017-07-07 VITALS — BP 121/69 | HR 77 | Temp 98.1°F | Resp 16 | Ht 62.0 in | Wt 142.0 lb

## 2017-07-07 DIAGNOSIS — I83892 Varicose veins of left lower extremities with other complications: Secondary | ICD-10-CM

## 2017-07-07 NOTE — Progress Notes (Signed)
Subjective:     Patient ID: Alicia Miller, female   DOB: 09/14/1976, 41 y.o.   MRN: [id number]  HPI This 40 year old female was referred by Dr.Funches for evaluation of painful varicose veins in the left leg. Patient has had the veins for many years. She is accompanied by an interpreter today as she speaks only [place]. She has no history of DVT thrombophlebitis stasis ulcers or bleeding. She does not elastic compression stockings because they're "uncomfortable". She has no symptoms in the right leg. She develops swelling at the end of the day and has heavy aching burning discomfort.  Past Medical History:  Diagnosis Date  . 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 Cancer (HCC)    Uterine  . Depression   . Left-sided Bell's palsy 01/19/2016  . No pertinent past medical history   . Varicose veins of left leg with edema     Social History  Substance Use Topics  . Smoking status: Never Smoker  . Smokeless tobacco: Never Used  . Alcohol use No    Family History  Problem Relation Age of Onset  . Diabetes Father   . Cancer Sister        uterine  . Diabetes Paternal Grandmother     Allergies  Allergen Reactions  . Bactrim [Sulfamethoxazole-Trimethoprim] Itching     Current Outpatient Prescriptions:  .  Elastic Bandages & Supports (MEDICAL COMPRESSION STOCKINGS) MISC, Knee high compression stockings 25-30 mm Hg pressure wear daily (Patient not taking: Reported on 07/07/2017), Disp: 2 each, Rfl: 0 .  Multiple Vitamins-Minerals (MULTIVITAMIN WITH MINERALS) tablet, Take 1 tablet by mouth daily. Reported on 01/19/2016, Disp: , Rfl:  .  pantoprazole (PROTONIX) 20 MG tablet, Take 1 tablet (20 mg total) by mouth daily with supper., Disp: 30 tablet, Rfl: 5 .  SUMAtriptan (IMITREX) 20 MG/ACT nasal spray, Spray once into L nares for cluster headache. May repeat in 2 hours if headache persists or  recurs. Max 2 doses in 7 day period. (Patient not taking: Reported on 07/07/2017), Disp: 1 Inhaler, Rfl: 0 .  triamcinolone cream (KENALOG) 0.1 %, Apply 1 application topically 2 (two) times daily. (Patient not taking: Reported on 07/07/2017), Disp: 30 g, Rfl: 0  Vitals:   07/07/17 1208  BP: 121/69  Pulse: 77  Resp: 16  Temp: 98.1 F (36.7 C)  SpO2: 99%  Weight: 142 lb (64.4 kg)  Height: 5\' 2"  (1.575 m)    Body mass index is 25.97 kg/m.         Review of Systems Does occasionally have chest discomfort but no history of coronary artery disease. Has history of GERD. Had carcinoma in situ of the cervix.    Objective:   Physical Exam BP 121/69 (BP Location: Left Arm, Patient Position: Sitting, Cuff Size: Normal)   Pulse 77   Temp 98.1 F (36.7 C)   Resp 16   Ht 5\' 2"  (1.575 m)   Wt 142 lb (64.4 kg)   SpO2 99%   BMI 25.97 kg/m     Gen.-alert and oriented x3 in no apparent distress HEENT normal for age Lungs no rhonchi or wheezing Cardiovascular regular rhythm no murmurs carotid pulses 3+ palpable no bruits audible Abdomen soft nontender no palpable masses Musculoskeletal free of  major deformities Skin clear -no rashes Neurologic normal Lower extremities 3+ femoral and dorsalis pedis pulses palpable bilaterally with no edema Left leg with  bulging varicosities between the knee and medial malleolus of the great saphenous vein with no hyperpigmentation or ulceration noted.  Today I performed a bedside SonoSite ultrasound exam which revealed enlarged left great saphenous vein with apparent reflux supplying these painful varicosities       Assessment:     Painful varicosities left leg due to gross reflux left great saphenous vein    Plan:         #1 long leg elastic compression stockings 20-30 mm gradient #2 elevate legs as much as possible #3 ibuprofen daily on a regular basis for pain #4 return in 3 months-she will have formal venous reflux exam upon  return and depending on the findings were leg formal recommendation. She does have an orange card through [facility] so this will not be submitted to insurance for prior approval

## 2017-07-08 NOTE — Addendum Note (Signed)
Addended by: Lianne Cure A on: 07/08/2017 12:33 PM   Modules accepted: Orders

## 2017-07-16 ENCOUNTER — Ambulatory Visit: Payer: Self-pay | Attending: Internal Medicine

## 2017-08-06 ENCOUNTER — Ambulatory Visit (INDEPENDENT_AMBULATORY_CARE_PROVIDER_SITE_OTHER): Payer: Self-pay | Admitting: Family Medicine

## 2017-08-06 VITALS — BP 106/64 | HR 83 | Temp 98.3°F | Ht 62.0 in | Wt 142.0 lb

## 2017-08-06 DIAGNOSIS — D239 Other benign neoplasm of skin, unspecified: Secondary | ICD-10-CM | POA: Insufficient documentation

## 2017-08-06 DIAGNOSIS — I781 Nevus, non-neoplastic: Secondary | ICD-10-CM | POA: Insufficient documentation

## 2017-08-06 NOTE — Assessment & Plan Note (Addendum)
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 Likely benign. Patient states she desires removal -excisional biopsy of lesion on left thigh performed by Dr. Gwendlyn Deutscher. Both verbal and written informed consent obtained prior to procedure. Fibroma excision performed in clinic using sterile technique. Area was initially prepped with alcohol swab prior to lidocaine injection. Needle with lidocaine was inserted bilaterally and aspirated to ensure no blood was drawn back. 3cc 1% Lidocaine with epi was injected to raise a wheal. Area was draped to ensure sterile field and prepped with betadine prior to procedure. Prior to removal with scalpel, area was palpated to ensure adequate anesthesia.  Area was marked to ensure proper borders. Lesion was removed and placed in labeled container for pathology. Gauze applied with pressure and 6 sutures were placed. Area was cleaned and dressed.  Patient tolerated procedure well with negligible blood loss. Removed lesion was sent for pathology  Advised patient to look for signs of bleeding or infection and to return if problems arise.  -monitor lesion on left arm -follow up in 7 days for suture removal -instructions for keeping suture site clean were verbally given to patient and instructions printed in [place] for patient -consider excision of lesion on left arm if patient desires

## 2017-08-06 NOTE — Patient Instructions (Signed)
[place] (Sutured Wound Care) Las suturas son puntos que pueden usarse para cerrar heridas. El cuidado correcto de las heridas puede ayudar a Information systems manager y a [place] infecciones. Adems puede ayudar a que la cicatrizacin sea ms rpida. CMO CUIDAR LA HERIDA SUTURADA Cuidados de la herida  Mantenga la herida limpia y seca.  Si le colocaron una venda (vendaje), cmbiela por lo menos una vez al da o como se lo haya indicado el mdico. Tambin debe cambiarla si se moja o se ensucia.  Mantenga la herida completamente seca durante las primeras 24horas o como se lo haya indicado el mdico. Transcurrido ese tiempo, puede ducharse o tomar baos de inmersin. No obstante, asegrese de no sumergir la herida en agua hasta que le hayan quitado las suturas.  Limpie la herida una vez al da o como se lo haya indicado el mdico. ? Lave la herida con agua y [place]. ? Enjuguela con agua para quitar todo el jabn. ? Seque dando palmaditas con una toalla limpia. No frote la herida.  Despus de limpiar la herida, aplique sobre esta una capa delgada de ungento con antibitico como se lo haya indicado el mdico. El ungento se aplica con estos fines: ? Ayuda a prevenir una infeccin. ? Evita que la venda se adhiera a la herida.  El retiro de las suturas debe hacerse como se lo haya indicado el [place] o aplquese los medicamentos solamente como se lo haya indicado el mdico.  Para ayudar a Mining engineer formacin de cicatrices, cbrase la herida con pantalla solar siempre que est al [employer], despus de que se hayan retirado las suturas y la herida haya cicatrizado. Use una pantalla solar con factor de proteccin solar (FPS) de por lo menos30.  Si le recetaron antibiticos o un ungento, asegrese de terminarlos, incluso si comienza a Sports administrator.  No se rasque ni se toque la herida.  Concurra a todas las visitas de control como se lo  haya indicado el mdico. Esto es importante.  Controle la herida [employer] para detectar signos de infeccin. Est atento a lo siguiente: ? Dolor, hinchazn o enrojecimiento. ? Lquido, sangre o pus.  Cuando est sentado o acostado, eleve la zona de la lesin por encima del nivel del corazn, si es posible.  No estire la herida.  Beba suficiente lquido para mantener el pis (orina) claro o de color amarillo plido. SOLICITE AYUDA SI:  Le aplicaron la antitetnica y en el lugar de la insercin de la aguja tiene alguno de estos signos: ? Hinchazn. ? Dolor intenso. ? Enrojecimiento. ? Hemorragia.  Tiene fiebre.  La herida estaba cerrada y se abre.  Percibe que sale mal olor de la herida.  Nota un cuerpo extrao en la herida, como un trozo de [place] o vidrio.  Los medicamentos no Forensic psychologist.  Tiene alguno de estos signos en el lugar de la herida. ? Aumenta el enrojecimiento. ? Aumenta la hinchazn. ? Aumenta el dolor.  Alguna de estas sustancias emana de la herida. ? Lquido. ? [place]. ? Pus.  Observa que la piel cerca de la herida cambia de color.  Debe cambiar la venda con frecuencia debido a que hay secrecin de lquido, sangre o pus de la herida.  Tiene una erupcin cutnea nueva.  Tiene entumecimiento alrededor de la herida.  SOLICITE AYUDA DE INMEDIATO SI:  Hay mucha hinchazn alrededor de la herida.  El dolor empeora repentinamente y  es muy intenso.  Tiene bultos dolorosos cerca de la herida o en la piel en cualquier parte del cuerpo.  Tiene una lnea roja que sale de la herida.  Tiene la herida en la mano o en el pie, y no puede mover los dedos con normalidad.  La herida est en la mano o en el pie y [employer] dedos tienen un tono plido o [place].  Esta informacin no tiene Marine scientist el consejo del mdico. Asegrese de hacerle al mdico cualquier pregunta que tenga. Document Released: 05/20/2011 Document Revised:  03/21/2015 Document Reviewed: 06/16/2013 Elsevier Interactive Patient Education  2017 [employer].   It was a pleasure meeting you today.   Today we discussed your moles and we excises the one on your left thigh. You have 6 stitches in and you need to come back in 7 days for suture removal.   Please keep area clean and dry. Do not rub area but you may wash with water running down the leg.   Please follow up in 7 days or sooner if symptoms persist or worsen. Please call the clinic immediately if you develop a fever, the area swells, the area becomes red, or you notice pus or drainage.   Our clinic's number is [phone]. Please call with questions or concerns.   Thank you,  [name], DO

## 2017-08-06 NOTE — Progress Notes (Signed)
   Subjective:    Patient ID: Alicia Miller, female    DOB: 09/22/1976, 41 y.o.   MRN: [id number] Intepretor: [id number]  CC: Moles  HPI: Mole Patient was referred to dermatology clinic by PCP for mole removal. Patient states she has a mole on her nose, left arm, and left leg. Patient states mole on her nose has been there since childhood and is unsure of when it arose. Patient states she saw her PCP in July and he was able to remove a mole on her head but states she would need to see a dermatologist for removal of the mole on her face. States it was originally smaller in childhood and has progressively increased in size. Patient states mole on arm and her leg began 2 years ago and notes no changes. States they itch sometimes. Denies any drainage or bleeding.   Review of Systems All negative other than noted in HPI  Objective:  BP 106/64   Pulse 83   Temp 98.3 F (36.8 C) (Oral)   Ht 5\' 2"  (1.575 m)   Wt 142 lb (64.4 kg)   SpO2 99%   BMI 25.97 kg/m  Vitals and nursing note reviewed  General: well nourished, in no acute distress HEENT: normocephalic Extremities: no edema or cyanosis. Warm, well perfused.  Neuro: alert and oriented, no focal deficits Skin: warm and dry, no rashes noted. Nevus noted on right side of nose 75mmx5mm in size, hyperpigmented, raised, no tenderness to palpation. Area of hyperpigmentation on back of left upper arm, flat. Area of hyperpigmentation on left upper thigh, slightly raised, 54mmx7mm.         Assessment & Plan:    Nevus, non-neoplastic Nevus on nose measuring 59mmx5mm -continue to monitor -consider referral to plastic surgery if patient desires removal.  -follow up with PCP  Dermatofibroma Hyperpigmented lesions on left upper arm and left thigh likely dermatofibroma given slightly raised, hyperpigmentation, and slight itching. Likely benign. Patient states she desires removal -excisional biopsy of lesion on left thigh performed by Dr.  Gwendlyn Deutscher. Both verbal and written informed consent obtained prior to procedure. Fibroma excision performed in clinic using sterile technique. Area was initially prepped with alcohol swab prior to lidocaine injection. Needle with lidocaine was inserted bilaterally and aspirated to ensure no blood was drawn back. 3cc 1% Lidocaine with epi was injected to raise a wheal. Area was draped to ensure sterile field and prepped with betadine prior to procedure. Prior to removal with scalpel, area was palpated to ensure adequate anesthesia.  Area was marked to ensure proper borders. Lesion was removed and placed in labeled container for pathology. Gauze applied with pressure and 6 sutures were placed. Area was cleaned and dressed.  Patient tolerated procedure well with negligible blood loss. Removed lesion was sent for pathology  Advised patient to look for signs of bleeding or infection and to return if problems arise.  -monitor lesion on left arm -follow up in 7 days for suture removal -instructions for keeping suture site clean were verbally given to patient and instructions printed in [place] for patient -consider excision of lesion on left arm if patient desires   Return in about 7 days (around 08/13/2017).  Alicia More, DO, PGY-1

## 2017-08-06 NOTE — Assessment & Plan Note (Signed)
Nevus on nose measuring 23mmx5mm -continue to monitor -consider referral to plastic surgery if patient desires removal.  -follow up with PCP

## 2017-08-08 ENCOUNTER — Telehealth: Payer: Self-pay | Admitting: Family Medicine

## 2017-08-08 NOTE — Telephone Encounter (Signed)
Biopsy result discussed with her. I reminded her to return for suture removal 7 days after procedure. She verbalized understanding.

## 2017-08-12 ENCOUNTER — Ambulatory Visit: Payer: Self-pay | Admitting: Family Medicine

## 2017-08-15 ENCOUNTER — Encounter: Payer: Self-pay | Admitting: Family Medicine

## 2017-08-15 ENCOUNTER — Ambulatory Visit (INDEPENDENT_AMBULATORY_CARE_PROVIDER_SITE_OTHER): Payer: Self-pay | Admitting: Family Medicine

## 2017-08-15 VITALS — BP 102/74 | HR 82 | Temp 98.2°F | Wt 140.0 lb

## 2017-08-15 DIAGNOSIS — Z4802 Encounter for removal of sutures: Secondary | ICD-10-CM

## 2017-08-15 DIAGNOSIS — Z5189 Encounter for other specified aftercare: Secondary | ICD-10-CM

## 2017-08-15 NOTE — Progress Notes (Signed)
Subjective:    Alicia Miller is a 41 y.o. female who obtained an excision of her dermatofibroma about 1 week ago, here for wound check and suture removal. No complaints.  The following portions of the patient's history were reviewed and updated as appropriate: allergies, current medications, past family history, past medical history, past social history, past surgical history and problem list.  Review of Systems Pertinent items noted in HPI and remainder of comprehensive ROS otherwise negative.    Objective:    BP 102/74   Pulse 82   Temp 98.2 F (36.8 C) (Oral)   Wt 140 lb (63.5 kg)   LMP 07/23/2017   SpO2 99%   BMI 25.61 kg/m  exam:   Gen: Well appearing. Ext: surgical laceration on her left lateral thigh appears clean, no drainage, no erythema.     Assessment:    Laceration is healing well, without evidence of infection.    Plan:     1. 6 sutures were removed under sterile condition after obtained verbal informed consent. 2. Wound care discussed. 3. Follow up as needed.

## 2017-08-15 NOTE — Patient Instructions (Signed)
Wound Check  If you have a wound, it may take some time to heal. Eventually, a scar will form. The scar will also fade with time. It is important to take care of your wound while it is healing. This helps to protect your wound from infection.  How should I take care of my wound at home?   Some wounds are allowed to close on their own or are repaired at a later date. There are many different ways to close and cover a wound, including stitches (sutures), skin glue, and adhesive strips. Follow your health care provider's instructions about:  ? Wound care.  ? Bandage (dressing) changes and removal.  ? Wound closure removal.   Take medicines only as directed by your health care provider.   Keep all follow-up visits as directed by your health care provider. This is important.   Do not take baths, swim, or use a hot tub until your health care provider approves. You may shower as directed by your health care provider.   Keep your wound clean and dry.  What affects scar formation?  Scars affect each person differently. How your body scars depends on:   The location and size of your wound.   Traits that you inherited from your parents (genetic predisposition).   How you take care of your wound. Irritation and inflammation increase the amount of scar formation.   Sun exposure. This can darken a scar.    When should I call or see my health care provider?  Call or see your health care provider if:   You have redness, swelling, or pain at your wound site.   You have fluid, blood, or pus coming from your wound.   You have muscle aches, chills, or a general ill feeling.   You notice a bad smell coming from the wound.   Your wound separates after the sutures, staples, or skin adhesive strips have been removed.   You have persistent nausea or vomiting.   You have a fever.   You are dizzy.    When should I call 911 or go to the emergency room?  Call 911 or go to the emergency room if:   You faint.   You have  difficulty breathing.    This information is not intended to replace advice given to you by your health care provider. Make sure you discuss any questions you have with your health care provider.  Document Released: 08/10/2004 Document Revised: 04/17/2016 Document Reviewed: 08/16/2014  Elsevier Interactive Patient Education  2018 Elsevier Inc.

## 2017-08-22 ENCOUNTER — Ambulatory Visit: Payer: Self-pay | Attending: Internal Medicine | Admitting: Family Medicine

## 2017-08-22 ENCOUNTER — Ambulatory Visit (HOSPITAL_BASED_OUTPATIENT_CLINIC_OR_DEPARTMENT_OTHER): Payer: Self-pay | Admitting: Family Medicine

## 2017-08-22 ENCOUNTER — Encounter: Payer: Self-pay | Admitting: Family Medicine

## 2017-08-22 VITALS — BP 109/75 | HR 67 | Temp 97.5°F | Resp 16 | Wt 144.4 lb

## 2017-08-22 DIAGNOSIS — J0110 Acute frontal sinusitis, unspecified: Secondary | ICD-10-CM | POA: Insufficient documentation

## 2017-08-22 DIAGNOSIS — Z86001 Personal history of in-situ neoplasm of cervix uteri: Secondary | ICD-10-CM | POA: Insufficient documentation

## 2017-08-22 DIAGNOSIS — Z882 Allergy status to sulfonamides status: Secondary | ICD-10-CM | POA: Insufficient documentation

## 2017-08-22 MED ORDER — CETIRIZINE HCL 10 MG PO TABS: 10.0000 mg | ORAL_TABLET | Freq: Every day | ORAL | 1 refills | Status: DC

## 2017-08-22 MED FILL — ?CETIRIZINE HCL 10 MG TABLE: 10 | 30 days supply | Qty: 30 | Fill #0

## 2017-08-22 NOTE — Progress Notes (Signed)
Subjective:  Patient ID: Alicia Miller, female    DOB: May 18, 1976  Age: 41 y.o. MRN: [id number]  CC: Nasal Congestion   HPI Alicia Miller presents With a 2 day history of cough productive of clear sputum, nasal congestion, postnasal drip and throat discomfort and intermittent headaches. She denies otalgia, nausea or vomiting and has no dizziness. She has not had a fever greater than denies history of sick contacts. She has used loratadine with no improvement.   Past Medical History:  Diagnosis Date  . 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 Cancer (HCC)    Uterine  . Depression   . Left-sided Bell's palsy 01/19/2016  . No pertinent past medical history   . Varicose veins of left leg with edema     Past Surgical History:  Procedure Laterality Date  . CHOLECYSTECTOMY  05/2015    Allergies  Allergen Reactions  . Bactrim [Sulfamethoxazole-Trimethoprim] Itching     Outpatient Medications Prior to Visit  Medication Sig Dispense Refill  . Elastic Bandages & Supports (MEDICAL COMPRESSION STOCKINGS) MISC Knee high compression stockings 25-30 mm Hg pressure wear daily (Patient not taking: Reported on 07/07/2017) 2 each 0  . Multiple Vitamins-Minerals (MULTIVITAMIN WITH MINERALS) tablet Take 1 tablet by mouth daily. Reported on 01/19/2016    . pantoprazole (PROTONIX) 20 MG tablet Take 1 tablet (20 mg total) by mouth daily with supper. 30 tablet 5  . SUMAtriptan (IMITREX) 20 MG/ACT nasal spray Spray once into L nares for cluster headache. May repeat in 2 hours if headache persists or recurs. Max 2 doses in 7 day period. (Patient not taking: Reported on 07/07/2017) 1 Inhaler 0  . triamcinolone cream (KENALOG) 0.1 % Apply 1 application topically 2 (two) times daily. (Patient not taking: Reported on 07/07/2017) 30 g 0   No facility-administered medications prior to visit.      ROS Review of Systems  Constitutional: Negative for activity change, appetite change and fatigue.  HENT: Positive for congestion and sore throat. Negative for sinus pressure.   Eyes: Negative for visual disturbance.  Respiratory: Positive for cough. Negative for chest tightness, shortness of breath and wheezing.   Cardiovascular: Negative for chest pain and palpitations.  Gastrointestinal: Negative for abdominal distention, abdominal pain and constipation.  Endocrine: Negative for polydipsia.  Genitourinary: Negative for dysuria and frequency.  Musculoskeletal: Negative for arthralgias and back pain.  Skin: Negative for rash.  Neurological: Positive for headaches. Negative for tremors, light-headedness and numbness.  Hematological: Does not bruise/bleed easily.  Psychiatric/Behavioral: Negative for agitation and behavioral problems.    Objective:  BP 109/75   Pulse 67   Temp (!) 97.5 F (36.4 C)   Resp 16   LMP 07/23/2017   SpO2 97%   BP/Weight 08/22/2017 08/22/2017 04/19/931  Systolic BP 355 - 732  Diastolic BP 75 - 74  Wt. (Lbs) - 141.4 140  BMI - 25.86 25.61      Physical Exam  Constitutional: She is oriented to person, place, and time. She appears well-developed and well-nourished.  HENT:  Right Ear: External ear normal.  Left Ear: External ear normal.  No sinus tenderness on palpation Mild oropharyngeal erythema  Cardiovascular: Normal rate, normal heart sounds and intact distal pulses.   No murmur heard. Pulmonary/Chest: Effort normal and breath sounds normal. She has no wheezes. She has no rales. She exhibits no tenderness.  Abdominal: Soft. Bowel sounds are  normal. She exhibits no distension and no mass. There is no tenderness.  Musculoskeletal: Normal range of motion.  Neurological: She is alert and oriented to person, place, and time.     Assessment & Plan:   1. Acute non-recurrent frontal sinusitis No indication for antibiotics at this time Placed  on Zyrtec Advised to use Tylenol for myalgias or headache Also to use nasal flushes Rest, increase hydration   Meds ordered this encounter  Medications  . DISCONTD: cetirizine (ZYRTEC) 10 MG tablet    Sig: Take 1 tablet (10 mg total) by mouth daily.    Dispense:  30 tablet    Refill:  1  . cetirizine (ZYRTEC) 10 MG tablet    Sig: Take 1 tablet (10 mg total) by mouth daily.    Dispense:  30 tablet    Refill:  1    Follow-up: Return in about 2 months (around 10/22/2017), or if symptoms worsen or fail to improve, for follow up of chronic medical conditions with PCP- Dr Doreene Burke.   Arnoldo Morale MD

## 2017-08-22 NOTE — Progress Notes (Signed)
Patient Triage Assessment Form Todays Date: 08/22/2017 Name: Alicia Miller DOB: [birth year] Reason for walkin: "problems with throat for 1 week" Denies pain. Clear phlegm with cough and nasal drainage.  When did your symptoms start? On Wednesday Please list symptoms:cough nasal, congestion, unable to breathe d/t nasal congestion Are you having pain: no  Tried loratidine  Assessment Vital Signs:  T:  97.5 P: 67  R: 16  SpO2: 97% BP: 109/75  Plan Work-in with provider: Dr. Jarold Song

## 2017-08-22 NOTE — Patient Instructions (Signed)
Sinusitis en los adultos  (Sinusitis, Adult)  La sinusitis es la inflamacin y el dolor en los senos paranasales. Los senos paranasales son espacios vacos en los huesos alrededor del rostro. Estos se encuentran en estos lugares:   Alrededor de los ojos.   En la mitad de la frente.   Detrs de la nariz.   En los pmulos.  Los senos y las fosas nasales estn cubiertos de un lquido fibroso (mucosidad). Normalmente, la mucosidad drena a travs de los senos. Cuando los tejidos nasales se inflaman o hinchan, la mucosidad puede quedar atrapada o bloqueada, por lo que el aire no puede fluir por los senos paranasales. Esto permite que se desarrollen bacterias, virus y hongos, lo que produce infecciones.  CUIDADOS EN EL HOGAR  Medicamentos   Tome, use o aplique los medicamentos de venta libre y los recetados solamente como se lo haya indicado el mdico. Estos pueden incluir aerosoles nasales.   Si le recetaron un antibitico, tmelo como se lo haya indicado el mdico. No deje de tomar los antibiticos aunque comience a sentirse mejor.  Hidrtese y humidifique los ambientes   Beba suficiente agua para mantener la orina clara o de color amarillo plido.   Use un humidificador de vapor fro para mantener la humedad de su hogar por encima del 50%.   Inhale vapor durante 10a 15minutos, de 3a 4veces al da o como se lo haya indicado el mdico. Puede hacer esto en el bao con el vapor del agua caliente de la ducha.   Trate de no exponerse al aire fro o seco.  Reposo   Descanse todo lo que pueda.   Duerma con la cabeza elevada.   Asegrese de dormir lo suficiente cada noche.  Instrucciones generales   Pngase un pao caliente y hmedo en el rostro 3o 4veces al da, o como se lo haya indicado su mdico. Esto ayuda a calmar las molestias.   Lave sus manos frecuentemente con agua y jabn. Use un desinfectante para manos si no dispone de agua y jabn.   No fume. Evite estar cerca de personas que fuman (fumador  pasivo).   Concurra a todas las visitas de control como se lo haya indicado el mdico. Esto es importante.  SOLICITE AYUDA SI:   Tiene fiebre.   Los sntomas empeoran.   Los sntomas no mejoran en el perodo de 10das.  SOLICITE AYUDA DE INMEDIATO SI:   Siente un dolor de cabeza muy intenso.   No puede dejar de vomitar.   Tiene dolor o hinchazn en la zona del rostro o los ojos.   Tiene dificultad para ver.   Se siente confundido.   Tiene el cuello rgido.   Tiene dificultad para respirar.  Esta informacin no tiene como fin reemplazar el consejo del mdico. Asegrese de hacerle al mdico cualquier pregunta que tenga.  Document Released: 07/29/2012 Document Revised: 02/26/2016 Document Reviewed: 08/30/2015  Elsevier Interactive Patient Education  2018 Elsevier Inc.

## 2017-08-22 NOTE — Progress Notes (Signed)
Patient Triage Assessment Form Todays Date: 08/22/2017 Name: Alicia Miller DOB: [birth year] Reason for walkin: "problems with throat for 1 week" Denies pain. Clear phlegm with cough and nasal drainage.  When did your symptoms start? On Wednesday Please list symptoms:cough nasal, congestion, unable to breathe d/t nasal congestion Are you having pain: no  Tried loratidine  Assessment Vital Signs:  T:  97.5 P: 67  R: 16  SpO2: 97% BP: 109/75  Plan Work-in with provider: Dr. Jarold Song

## 2017-09-03 ENCOUNTER — Ambulatory Visit: Payer: Self-pay | Attending: Internal Medicine

## 2017-09-11 ENCOUNTER — Encounter (HOSPITAL_COMMUNITY): Payer: Self-pay

## 2017-09-11 ENCOUNTER — Ambulatory Visit
Admission: RE | Admit: 2017-09-11 | Discharge: 2017-09-11 | Disposition: A | Discharge status: Home / Self Care | Payer: No Typology Code available for payment source | Source: Ambulatory Visit | Attending: Obstetrics and Gynecology | Admitting: Obstetrics and Gynecology

## 2017-09-11 ENCOUNTER — Ambulatory Visit (HOSPITAL_COMMUNITY)
Admission: RE | Admit: 2017-09-11 | Discharge: 2017-09-11 | Disposition: A | Discharge status: Home / Self Care | Payer: Self-pay | Source: Ambulatory Visit | Attending: Obstetrics and Gynecology | Admitting: Obstetrics and Gynecology

## 2017-09-11 VITALS — BP 120/70 | Temp 98.6°F | Wt 140.0 lb

## 2017-09-11 DIAGNOSIS — Z01419 Encounter for gynecological examination (general) (routine) without abnormal findings: Secondary | ICD-10-CM

## 2017-09-11 DIAGNOSIS — N898 Other specified noninflammatory disorders of vagina: Secondary | ICD-10-CM

## 2017-09-11 DIAGNOSIS — Z1231 Encounter for screening mammogram for malignant neoplasm of breast: Secondary | ICD-10-CM

## 2017-09-11 NOTE — Patient Instructions (Signed)
Explained breast self awareness with Alicia Miller. Let patient know that her follow-up and next Pap smear will be due based on the results of today's Pap smear. Referred patient to the [facility] for a screening mammogram. Appointment scheduled for Thursday, September 11, 2017 at 1110. Let patient know will follow up with her within the next couple weeks with results of Pap smear and wet prep by phone. Informed patient that the [facility] will follow up with her within the next couple of weeks with results of mammogram by letter or phone. Alicia Miller verbalized understanding.  Lanisa Ishler, [name], RN 2:00 PM

## 2017-09-11 NOTE — Progress Notes (Signed)
Complaints of a right breast bump next to nipple that becomes inflamed during menstrual cycle and a thick white discharge comes out of it when squeezed.  Pap Smear: Pap smear completed today. Last Pap smear was 01/30/2017 at [facility] and normal with negative HPV. Patient has a history of an abnormal Pap smear 10/06/2015 at [facility] that showed endocervical adenocarcinoma in situ. A LEEP was completed for follow up 12/01/2015. Patient also has a history of a Pap smear 10/06/2014 that was normal and positive for HPV. A colposcopy was completed to follow up 11/03/2014. The above results and Pap smear result from 07/25/2016 are in EPIC.  Physical exam: Breasts Breasts symmetrical. No skin abnormalities left breast. An inflamed montgomery gland observed right areola at 9 o'clock. No nipple retraction bilateral breasts. No nipple discharge bilateral breasts. No lymphadenopathy. No lumps palpated bilateral breasts. Patient complained of some tenderness when palpated inflamed montgomery gland. Per Dr. Shelly Bombard a screening mammogram is indicated. Referred patient to the [facility] for a screening mammogram. Appointment scheduled for Thursday, September 11, 2017 at 1110.  Pelvic/Bimanual   Ext Genitalia No lesions, no swelling and no discharge observed on external genitalia.         Vagina Vagina pink and normal texture. No lesions and thick white yeast appearing discharge observed in vagina. Wet prep completed.        Cervix Cervix is present. Cervix pink and of normal texture. Thick white yeast appearing discharge observed on cervix.     Uterus Uterus is present and palpable. Uterus in normal position and normal size.        Adnexae Bilateral ovaries present and palpable. No tenderness on palpation.          Rectovaginal No rectal exam completed today since patient had no rectal complaints. No skin abnormalities observed on exam.    Smoking History: Patient has  never smoked.  Patient Navigation: Patient education provided. Access to services provided for patient through [facility] program. Spanish interpreter provided.  Used Spanish interpreter [employer] from [place].

## 2017-09-12 ENCOUNTER — Encounter (HOSPITAL_COMMUNITY): Payer: Self-pay | Admitting: *Deleted

## 2017-09-12 LAB — CYTOLOGY - PAP
Bacterial vaginitis: NEGATIVE
Candida vaginitis: NEGATIVE
Diagnosis: NEGATIVE
HPV: NOT DETECTED
Trichomonas: NEGATIVE

## 2017-09-15 ENCOUNTER — Telehealth (HOSPITAL_COMMUNITY): Payer: Self-pay | Admitting: *Deleted

## 2017-09-15 NOTE — Telephone Encounter (Signed)
Telephoned patient at home number and advised patient of negative pap smear results. HPV was negative. Next pap smear due in one year. Patient voiced understanding. Used interpreter Lavon Paganini.

## 2017-10-07 ENCOUNTER — Ambulatory Visit (INDEPENDENT_AMBULATORY_CARE_PROVIDER_SITE_OTHER): Payer: Self-pay | Admitting: Vascular Surgery

## 2017-10-07 ENCOUNTER — Ambulatory Visit (HOSPITAL_COMMUNITY)
Admission: RE | Admit: 2017-10-07 | Discharge: 2017-10-07 | Disposition: A | Discharge status: Home / Self Care | Payer: Self-pay | Source: Ambulatory Visit | Attending: Vascular Surgery | Admitting: Vascular Surgery

## 2017-10-07 ENCOUNTER — Encounter: Payer: Self-pay | Admitting: Vascular Surgery

## 2017-10-07 VITALS — BP 115/79 | HR 83 | Temp 97.3°F | Resp 16 | Ht 62.0 in | Wt 141.4 lb

## 2017-10-07 DIAGNOSIS — I83892 Varicose veins of left lower extremities with other complications: Secondary | ICD-10-CM | POA: Insufficient documentation

## 2017-10-07 NOTE — Progress Notes (Signed)
Subjective:     Patient ID: Alicia Miller, female   DOB: 03-01-1976, 42 y.o.   MRN: [id number]  HPI  This 41 year old female returns for 3 month follow-up regarding her painful varicosities in the left leg. These have been present for 11 years. She has tried long leg elastic compression stockings 20-30 millimeter gradient as well as elevation and ibuprofen and is continuing to have heaviness and aching discomfort. She does 120s treated.  Past Medical History:  Diagnosis Date  . 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 Cancer (HCC)    Uterine  . Depression   . Left-sided Bell's palsy 01/19/2016  . No pertinent past medical history   . Varicose veins of left leg with edema     Social History   Tobacco Use  . Smoking status: Never Smoker  . Smokeless tobacco: Never Used  Substance Use Topics  . Alcohol use: No    Family History  Problem Relation Age of Onset  . Diabetes Father   . Cancer Sister        uterine  . Diabetes Paternal Grandmother   . Diabetes Paternal Uncle   . Diabetes Paternal Uncle   . Diabetes Paternal Uncle   . Diabetes Paternal Uncle   . Diabetes Paternal Uncle   . Diabetes Paternal Uncle     Allergies  Allergen Reactions  . Bactrim [Sulfamethoxazole-Trimethoprim] Itching     Current Outpatient Medications:  .  cetirizine (ZYRTEC) 10 MG tablet, Take 1 tablet (10 mg total) by mouth daily., Disp: 30 tablet, Rfl: 1 .  Elastic Bandages & Supports (MEDICAL COMPRESSION STOCKINGS) MISC, Knee high compression stockings 25-30 mm Hg pressure wear daily, Disp: 2 each, Rfl: 0 .  Multiple Vitamins-Minerals (MULTIVITAMIN WITH MINERALS) tablet, Take 1 tablet by mouth daily. Reported on 01/19/2016, Disp: , Rfl:  .  pantoprazole (PROTONIX) 20 MG tablet, Take 1 tablet (20 mg total) by mouth daily with supper., Disp: 30 tablet, Rfl: 5 .  SUMAtriptan (IMITREX) 20 MG/ACT nasal  spray, Spray once into L nares for cluster headache. May repeat in 2 hours if headache persists or recurs. Max 2 doses in 7 day period. (Patient not taking: Reported on 07/07/2017), Disp: 1 Inhaler, Rfl: 0 .  triamcinolone cream (KENALOG) 0.1 %, Apply 1 application topically 2 (two) times daily. (Patient not taking: Reported on 07/07/2017), Disp: 30 g, Rfl: 0  Vitals:   10/07/17 1306  BP: 115/79  Pulse: 83  Resp: 16  Temp: (!) 97.3 F (36.3 C)  TempSrc: Oral  SpO2: 97%  Weight: 141 lb 6.4 oz (64.1 kg)  Height: 5\' 2"  (1.575 m)    Body mass index is 25.86 kg/m.         Review of Systems Denies chest pain, dyspnea on exertion, PND, orthopnea, hemoptysis    Objective:   Physical Exam BP 115/79 (BP Location: Left Arm, Patient Position: Sitting, Cuff Size: Normal)   Pulse 83   Temp (!) 97.3 F (36.3 C) (Oral)   Resp 16   Ht 5\' 2"  (1.575 m)   Wt 141 lb 6.4 oz (64.1 kg)   SpO2 97%   BMI 25.86 kg/m   Gen. well-developed well-nourished female no apparent distress alert and oriented 3 Lungs no rhonchi or wheezing Left leg with bulging varicosities over great saphenous system beginning at the knee extending toward the medial malleolus with no distal edema, hyperpigmentation,  or ulceration noted.  Today I ordered a venous duplex exam the left leg which I reviewed and interpreted. 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

## 2017-10-08 ENCOUNTER — Encounter: Payer: Self-pay | Admitting: Internal Medicine

## 2017-10-08 ENCOUNTER — Ambulatory Visit: Payer: Self-pay | Attending: Internal Medicine | Admitting: Internal Medicine

## 2017-10-08 ENCOUNTER — Ambulatory Visit (HOSPITAL_COMMUNITY)
Admission: RE | Admit: 2017-10-08 | Discharge: 2017-10-08 | Disposition: A | Discharge status: Home / Self Care | Payer: Self-pay | Source: Ambulatory Visit | Attending: Internal Medicine | Admitting: Internal Medicine

## 2017-10-08 VITALS — BP 108/68 | HR 80 | Temp 98.2°F | Resp 18 | Ht 62.0 in | Wt 140.0 lb

## 2017-10-08 DIAGNOSIS — K219 Gastro-esophageal reflux disease without esophagitis: Secondary | ICD-10-CM | POA: Insufficient documentation

## 2017-10-08 DIAGNOSIS — M25511 Pain in right shoulder: Secondary | ICD-10-CM | POA: Insufficient documentation

## 2017-10-08 DIAGNOSIS — Z8541 Personal history of malignant neoplasm of cervix uteri: Secondary | ICD-10-CM | POA: Insufficient documentation

## 2017-10-08 DIAGNOSIS — I83892 Varicose veins of left lower extremities with other complications: Secondary | ICD-10-CM | POA: Insufficient documentation

## 2017-10-08 DIAGNOSIS — J385 Laryngeal spasm: Secondary | ICD-10-CM | POA: Insufficient documentation

## 2017-10-08 DIAGNOSIS — F329 Major depressive disorder, single episode, unspecified: Secondary | ICD-10-CM | POA: Insufficient documentation

## 2017-10-08 LAB — POCT URINALYSIS DIPSTICK
Bilirubin, UA: NEGATIVE
Glucose, UA: NEGATIVE
Ketones, UA: NEGATIVE
Leukocytes, UA: NEGATIVE
Nitrite, UA: NEGATIVE
Protein, UA: NEGATIVE
Spec Grav, UA: 1.025 (ref 1.010–1.025)
Urobilinogen, UA: 0.2 E.U./dL
pH, UA: 6 (ref 5.0–8.0)

## 2017-10-08 MED ORDER — ACETAMINOPHEN-CODEINE #3 300-30 MG PO TABS: 1.0000 | ORAL_TABLET | ORAL | 0 refills | Status: DC | PRN

## 2017-10-08 MED ORDER — DICLOFENAC SODIUM 1 % TD GEL: 4.0000 g | Freq: Four times a day (QID) | TRANSDERMAL | 1 refills | Status: DC

## 2017-10-08 MED FILL — DICLOFENAC SODIUM 1% GEL: 1 | 6 days supply | Qty: 100 | Fill #0

## 2017-10-08 MED FILL — ACETAMINOPHEN/COD #3 TABLET: 300-30 | 10 days supply | Qty: 60 | Fill #0

## 2017-10-08 NOTE — Patient Instructions (Signed)
Dolor en el hombro (Shoulder Pain) Muchas cosas pueden provocar dolor en el hombro, por ejemplo:  Una lesin.  Un movimiento del brazo que se repite una y otra vez de la misma manera (uso excesivo).  Dolor en las articulaciones (artritis). CUIDADOS EN EL HOGAR Tome estas medidas para aliviar el dolor:  Apriete una pelota blanda o una almohadilla de goma tanto como sea posible. Esto ayuda a evitar la hinchazn. Tambin fortalece el brazo.  Tome los medicamentos de venta libre y los recetados solamente como se lo haya indicado el mdico.  Si se lo indican, aplique hielo sobre la zona: ? Ponga el hielo en una bolsa plstica. ? Coloque una toalla entre la piel y la bolsa de hielo. ? Coloque el hielo durante 20minutos, 2 a 3veces por da. Deje de aplicarse hielo si no ayuda a aliviar el dolor.  Si le indicaron que use un cabestrillo o un inmovilizador en el hombro: ? selos como se lo hayan indicado. ? Quteselos para ducharse o para baarse. ? Mueva el brazo lo menos posible. ? Siga moviendo la mano. Esto ayuda a evitar la hinchazn. SOLICITE AYUDA SI:  El dolor empeora.  Los medicamentos no le alivian el dolor.  Siente un dolor nuevo en el brazo, la mano o los dedos. SOLICITE AYUDA DE INMEDIATO SI:  El brazo, la mano o los dedos: ? Hormiguean. ? Estn adormecidos. ? Estn hinchados. ? Estn doloridos. ? Se tornan de color blanco o azul. Esta informacin no tiene como fin reemplazar el consejo del mdico. Asegrese de hacerle al mdico cualquier pregunta que tenga. Document Released: 01/31/2009 Document Revised: 02/26/2016 Document Reviewed: 02/27/2015 Elsevier Interactive Patient Education  2018 Elsevier Inc.  

## 2017-10-08 NOTE — Progress Notes (Signed)
Alicia Miller, is a 41 y.o. female  FTD:[id number]  CWC:[id number]  DOB - 10-09-1976  Chief Complaint  Patient presents with  . Referral      Subjective:   Alicia Miller is a 41 y.o. female here today for a referral to ENT. Patient was recently treated here for productive cough, throat discomfort, postnasal drip and congestion. She did not get better and now feels like her throat is closing up, happens intermittently. She denies fever. Throat still hurts. No SOB. Headache has resolved but still has right shoulder pain. Patient admits to some domestic stressors. Denies any suicidal ideation or thoughts. No chest pain, No abdominal pain - No Nausea, No new weakness tingling or numbness.  Problem  Acute Pain of Right Shoulder    ALLERGIES: Allergies  Allergen Reactions  . Bactrim [Sulfamethoxazole-Trimethoprim] Itching    PAST MEDICAL HISTORY: Past Medical History:  Diagnosis Date  . 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 Cancer (HCC)    Uterine  . Depression   . Left-sided Bell's palsy 01/19/2016  . No pertinent past medical history   . Varicose veins of left leg with edema     MEDICATIONS AT HOME: Prior to Admission medications   Medication Sig Start Date End Date Taking? Authorizing Provider  cetirizine (ZYRTEC) 10 MG tablet Take 1 tablet (10 mg total) by mouth daily. 08/22/17  Yes Arnoldo Morale, MD  Elastic Bandages & Supports (MEDICAL COMPRESSION STOCKINGS) MISC Knee high compression stockings 25-30 mm Hg pressure wear daily 04/10/17  Yes Funches, Josalyn, MD  Multiple Vitamins-Minerals (MULTIVITAMIN WITH MINERALS) tablet Take 1 tablet by mouth daily. Reported on 01/19/2016   Yes [provider]  acetaminophen-codeine (TYLENOL #3) 300-30 MG tablet Take 1 tablet by mouth every 4 (four) hours as needed. 10/08/17   Tresa Garter, MD  diclofenac sodium  (VOLTAREN) 1 % GEL Apply 4 g topically 4 (four) times daily. 10/08/17   Tresa Garter, MD  pantoprazole (PROTONIX) 20 MG tablet Take 1 tablet (20 mg total) by mouth daily with supper. 04/10/17 05/10/17  Boykin Nearing, MD  SUMAtriptan (IMITREX) 20 MG/ACT nasal spray Spray once into L nares for cluster headache. May repeat in 2 hours if headache persists or recurs. Max 2 doses in 7 day period. Patient not taking: Reported on 10/08/2017 04/10/17   Boykin Nearing, MD  triamcinolone cream (KENALOG) 0.1 % Apply 1 application topically 2 (two) times daily. Patient not taking: Reported on 07/07/2017 06/12/17   Boykin Nearing, MD    Objective:   Vitals:   10/08/17 1432  BP: 108/68  Pulse: 80  Resp: 18  Temp: 98.2 F (36.8 C)  TempSrc: Oral  SpO2: 98%  Weight: 140 lb (63.5 kg)  Height: 5\' 2"  (1.575 m)   Exam General appearance : Awake, alert, not in any distress. Speech Clear. Not toxic looking HEENT: Atraumatic and Normocephalic, pupils equally reactive to light and accomodation Neck: Supple, no JVD. No cervical lymphadenopathy.  Chest: Good air entry bilaterally, no added sounds  CVS: S1 S2 regular, no murmurs.  Abdomen: Bowel sounds present, Non tender and not distended with no gaurding, rigidity or rebound. Extremities: B/L Lower Ext shows no edema, both legs are warm to touch Neurology: Awake alert, and oriented X 3, CN II-XII intact, Non focal Skin: No Rash  Data Review Lab Results  Component Value Date   HGBA1C 5.3  01/19/2016    Assessment & Plan   1. Laryngopharyngeal reflux  - Ambulatory referral to ENT  2. Laryngospasm  - Ambulatory referral to ENT  3. Acute pain of right shoulder  - DG Shoulder Right; Future - acetaminophen-codeine (TYLENOL #3) 300-30 MG tablet; Take 1 tablet by mouth every 4 (four) hours as needed.  Dispense: 60 tablet; Refill: 0 - diclofenac sodium (VOLTAREN) 1 % GEL; Apply 4 g topically 4 (four) times daily.  Dispense: 1 Tube; Refill:  1  Patient have been counseled extensively about nutrition and exercise. Other issues discussed during this visit include: low cholesterol diet, weight control and daily exercise  Interpreter was used to communicate directly with patient for the entire encounter including providing detailed patient instructions.   Return in about 6 months (around 04/07/2018) for Follow up Pain and comorbidities.  The patient was given clear instructions to go to ER or return to medical center if symptoms don't improve, worsen or new problems develop. The patient verbalized understanding. The patient was told to call to get lab results if they haven't heard anything in the next week.   This note has been created with Surveyor, quantity. Any transcriptional errors are unintentional.    Angelica Chessman, MD, [place], Karilyn Cota, [place] and [place], [place]   10/08/2017, 3:01 PM

## 2017-10-20 ENCOUNTER — Other Ambulatory Visit: Payer: Self-pay | Admitting: *Deleted

## 2017-10-20 DIAGNOSIS — I83892 Varicose veins of left lower extremities with other complications: Secondary | ICD-10-CM

## 2017-10-21 ENCOUNTER — Telehealth: Payer: Self-pay

## 2017-10-21 NOTE — Telephone Encounter (Signed)
CMA call regarding x ray  results   Patient Verify DOB   Patient was aware and understood

## 2017-10-22 ENCOUNTER — Encounter: Payer: Self-pay | Admitting: Internal Medicine

## 2017-10-22 ENCOUNTER — Ambulatory Visit: Payer: Self-pay | Attending: Internal Medicine | Admitting: Internal Medicine

## 2017-10-22 VITALS — BP 118/70 | HR 82 | Temp 98.4°F | Resp 18 | Ht 62.0 in | Wt 144.0 lb

## 2017-10-22 DIAGNOSIS — Z9049 Acquired absence of other specified parts of digestive tract: Secondary | ICD-10-CM | POA: Insufficient documentation

## 2017-10-22 DIAGNOSIS — M25511 Pain in right shoulder: Secondary | ICD-10-CM

## 2017-10-22 DIAGNOSIS — F329 Major depressive disorder, single episode, unspecified: Secondary | ICD-10-CM | POA: Insufficient documentation

## 2017-10-22 DIAGNOSIS — Z131 Encounter for screening for diabetes mellitus: Secondary | ICD-10-CM

## 2017-10-22 DIAGNOSIS — K219 Gastro-esophageal reflux disease without esophagitis: Secondary | ICD-10-CM

## 2017-10-22 DIAGNOSIS — Z79899 Other long term (current) drug therapy: Secondary | ICD-10-CM | POA: Insufficient documentation

## 2017-10-22 DIAGNOSIS — Z882 Allergy status to sulfonamides status: Secondary | ICD-10-CM | POA: Insufficient documentation

## 2017-10-22 DIAGNOSIS — M545 Low back pain: Secondary | ICD-10-CM | POA: Insufficient documentation

## 2017-10-22 DIAGNOSIS — M79641 Pain in right hand: Secondary | ICD-10-CM | POA: Insufficient documentation

## 2017-10-22 DIAGNOSIS — R1013 Epigastric pain: Secondary | ICD-10-CM

## 2017-10-22 DIAGNOSIS — M79601 Pain in right arm: Secondary | ICD-10-CM | POA: Insufficient documentation

## 2017-10-22 LAB — POCT GLYCOSYLATED HEMOGLOBIN (HGB A1C): Hemoglobin A1C: 5.4

## 2017-10-22 MED ORDER — PANTOPRAZOLE SODIUM 40 MG PO TBEC: 40.0000 mg | DELAYED_RELEASE_TABLET | Freq: Every day | ORAL | 5 refills | Status: DC

## 2017-10-22 MED ORDER — ACETAMINOPHEN-CODEINE #3 300-30 MG PO TABS: 1.0000 | ORAL_TABLET | ORAL | 0 refills | Status: DC | PRN

## 2017-10-22 MED ORDER — DICLOFENAC SODIUM 1 % TD GEL: 4.0000 g | Freq: Four times a day (QID) | TRANSDERMAL | 1 refills | Status: DC

## 2017-10-22 MED FILL — ?PANTOPRAZOLE SOD DR 40MG: 40 MG | 30 days supply | Qty: 30 | Fill #0

## 2017-10-22 MED FILL — DICLOFENAC SODIUM 1% GEL: 1 | 6 days supply | Qty: 100 | Fill #0

## 2017-10-22 MED FILL — ACETAMINOPHEN/COD #3 TABLET: 300-30 | 10 days supply | Qty: 60 | Fill #0

## 2017-10-22 NOTE — Progress Notes (Signed)
Alicia Miller, is a 41 y.o. female  QQI:[id number]  [id number]  DOB - Jun 16, 1976  Chief Complaint  Patient presents with  . Follow-up      Subjective:   Alicia Miller is a 41 y.o. female here today for a follow up visit. She is status post cholecystectomy. Her major complaint today is right hand, arm and shoulder pain. She said her right hand and arm hurts every time she does a repetitive movement or has to do house chores. She also complained of epigastric pain which she said felt like her pain when she had gall bladder issues and had to have surgery. She denies any N/V/D. No abdominal distension. She also complained of back pain, lower central area. She thinks this may be kidney stone related although she has no history of kidney stone. She denies any fever. She has no urinary symptoms, no change in bowel habits. Patient has No headache, No chest pain, No new weakness tingling or numbness, No Cough - SOB.  Problem  Epigastric Pain   ALLERGIES: Allergies  Allergen Reactions  . Bactrim [Sulfamethoxazole-Trimethoprim] Itching    PAST MEDICAL HISTORY: Past Medical History:  Diagnosis Date  . 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 Cancer (HCC)    Uterine  . Depression   . Left-sided Bell's palsy 01/19/2016  . No pertinent past medical history   . Varicose veins of left leg with edema     MEDICATIONS AT HOME: Prior to Admission medications   Medication Sig Start Date End Date Taking? Authorizing Provider  acetaminophen-codeine (TYLENOL #3) 300-30 MG tablet Take 1 tablet by mouth every 4 (four) hours as needed. 10/22/17  Yes Tresa Garter, MD  cetirizine (ZYRTEC) 10 MG tablet Take 1 tablet (10 mg total) by mouth daily. 08/22/17  Yes Arnoldo Morale, MD  diclofenac sodium (VOLTAREN) 1 % GEL Apply 4 g topically 4 (four) times daily. 10/22/17  Yes Tresa Garter,  MD  Elastic Bandages & Supports (MEDICAL COMPRESSION STOCKINGS) MISC Knee high compression stockings 25-30 mm Hg pressure wear daily 04/10/17  Yes Funches, Josalyn, MD  Multiple Vitamins-Minerals (MULTIVITAMIN WITH MINERALS) tablet Take 1 tablet by mouth daily. Reported on 01/19/2016   Yes [provider]  pantoprazole (PROTONIX) 40 MG tablet Take 1 tablet (40 mg total) by mouth daily with supper. 10/22/17   Tresa Garter, MD  SUMAtriptan (IMITREX) 20 MG/ACT nasal spray Spray once into L nares for cluster headache. May repeat in 2 hours if headache persists or recurs. Max 2 doses in 7 day period. Patient not taking: Reported on 10/08/2017 04/10/17   Boykin Nearing, MD    Objective:   Vitals:   10/22/17 1542  BP: 118/70  Pulse: 82  Resp: 18  Temp: 98.4 F (36.9 C)  TempSrc: Oral  SpO2: 96%  Weight: 144 lb (65.3 kg)  Height: '5\' 2"'$  (1.575 m)   Exam General appearance : Awake, alert, not in any distress. Speech Clear. Not toxic looking HEENT: Atraumatic and Normocephalic, pupils equally reactive to light and accomodation Neck: Supple, no JVD. No cervical lymphadenopathy.  Chest: Good air entry bilaterally, no added sounds  CVS: S1 S2 regular, no murmurs.  Abdomen: Bowel sounds present, Non tender and not distended with no gaurding, rigidity or rebound. Extremities: B/L Lower Ext shows no edema, both legs are warm to touch Neurology: Awake alert, and oriented X 3,  CN II-XII intact, Non focal Skin: No Rash  Data Review Lab Results  Component Value Date   HGBA1C 5.3 01/19/2016    Assessment & Plan   1. Laryngopharyngeal reflux  - pantoprazole (PROTONIX) 40 MG tablet; Take 1 tablet (40 mg total) by mouth daily with supper.  Dispense: 30 tablet; Refill: 5  2. Acute pain of right shoulder No significant physical finding, intact range of motions and X ray was normal. - acetaminophen-codeine (TYLENOL #3) 300-30 MG tablet; Take 1 tablet by mouth every 4 (four) hours as  needed.  Dispense: 60 tablet; Refill: 0 - diclofenac sodium (VOLTAREN) 1 % GEL; Apply 4 g topically 4 (four) times daily.  Dispense: 1 Tube; Refill: 1  3. Epigastric pain  - pantoprazole (PROTONIX) 40 MG tablet; Take 1 tablet (40 mg total) by mouth daily with supper.  Dispense: 30 tablet; Refill: 5  - CBC with Differential/Platelet - CMP14+EGFR - Lipid panel - Lipase  Patient have been counseled extensively about nutrition and exercise. Other issues discussed during this visit include: low cholesterol diet, weight control and daily exercise  Return in about 6 months (around 04/22/2018) for Follow up Pain and comorbidities.  The patient was given clear instructions to go to ER or return to medical center if symptoms don't improve, worsen or new problems develop. The patient verbalized understanding. The patient was told to call to get lab results if they haven't heard anything in the next week.   This note has been created with Surveyor, quantity. Any transcriptional errors are unintentional.    Angelica Chessman, MD, [place], Karilyn Cota, [place] and [place], [place]   10/22/2017, 4:37 PM

## 2017-10-22 NOTE — Patient Instructions (Signed)
Enfermedad por reflujo gastroesofgico en los adultos (Gastroesophageal Reflux Disease, Adult) Normalmente, los alimentos descienden por el esfago y se depositan en el estmago para su digestin. Si una persona tiene enfermedad por reflujo gastroesofgico (ERGE), los alimentos y el cido estomacal regresan al esfago. Cuando esto ocurre, el esfago se irrita y se hincha (inflama). Con el tiempo, la ERGE puede provocar la formacin de pequeas perforaciones (lceras) en la mucosa del esfago. CUIDADOS EN EL HOGAR Dieta  Siga la dieta como se lo haya indicado el mdico. Tal vez deba evitar los siguientes alimentos y bebidas: ? Caf y t (con o sin cafena). ? Bebidas que contengan alcohol. ? Bebidas energizantes y deportivas. ? Gaseosas o refrescos. ? Chocolate y cacao. ? Menta y [place]. ? Ajo y cebollas. ? Rbano picante. ? Alimentos muy condimentados y cidos, como pimientos, [place] en polvo, curry en polvo, vinagre, salsas picantes y Engineering geologist. ? Frutas ctricas y sus jugos, como naranjas, limones y limas. ? Alimentos a base de tomates, como salsa roja, [place], salsa y pizza con salsa roja. ? Alimentos fritos y Radio broadcast assistant, como rosquillas, papas fritas y aderezos con alto contenido de Lobbyist. ? Carnes con alto contenido de [place], como hot dogs, filetes de entrecot, salchicha, jamn y tocino. ? Productos lcteos con alto contenido de grasa, como [place], [place] y [place] crema.  Consuma pequeas porciones de comida con ms frecuencia. Evite consumir porciones abundantes.  Evite beber [place] comidas.  No coma durante las 2 o 3horas previas a la hora de [place].  No se acueste inmediatamente despus de comer.  No haga actividad fsica enseguida despus de comer. Instrucciones generales  Est atento a cualquier cambio en los sntomas.  Tome los medicamentos de venta libre y los recetados solamente como se lo haya indicado el mdico. No tome  aspirina, ibuprofeno ni otros antiinflamatorios no esteroides (AINE), a menos que el mdico lo autorice.  No consuma ningn producto que contenga tabaco, lo que incluye cigarrillos, tabaco de Higher education careers adviser y Psychologist, sport and exercise. Si necesita ayuda para dejar de fumar, consulte al mdico.  Use ropa suelta. No use nada ajustado alrededor [employer].  Levante (eleve) unas 6pulgadas (15centmetros) la cabecera de la cama.  Intente bajar el nivel de estrs. Si necesita ayuda para hacerlo, consulte al [employer].  Si tiene sobrepeso, Multimedia programmer un peso saludable. Pregntele a su mdico cmo puede perder peso de manera segura.  Concurra a todas las visitas de control como se lo haya indicado el mdico. Esto es importante. SOLICITE AYUDA SI:  Aparecen nuevos sntomas.  [place] y no sabe por qu.  Tiene dificultad para tragar o siente dolor al [employer].  Tiene sibilancias o tos que no desaparece.  Los sntomas no mejoran con Dispensing optician.  Tiene la voz ronca. SOLICITE AYUDA DE INMEDIATO SI:  Tiene dolor en los brazos, el cuello, los [place], la dentadura o la espalda.  Philbert Riser, se marea o tiene sensacin de desvanecimiento.  Siente falta de aire o Tourist information centre manager.  Vomita y el vmito es parecido a la sangre o a los granos de caf.  Pierde el conocimiento (se desmaya).  Las heces son sanguinolentas o de color negro.  No puede tragar, beber o comer. Esta informacin no tiene Marine scientist el consejo del mdico. Asegrese de hacerle al mdico cualquier pregunta que tenga. Document Released: 12/07/2010 Document Revised: 07/26/2015 Document Reviewed: 03/01/2015 Elsevier Interactive Patient Education  [employer].

## 2017-10-23 LAB — CBC WITH DIFFERENTIAL/PLATELET
Basophils Absolute: 0 10*3/uL (ref 0.0–0.2)
Basos: 0 %
EOS (ABSOLUTE): 0.4 10*3/uL (ref 0.0–0.4)
Eos: 4 %
Hematocrit: 41.6 % (ref 34.0–46.6)
Hemoglobin: 14.3 g/dL (ref 11.1–15.9)
Immature Grans (Abs): 0 10*3/uL (ref 0.0–0.1)
Immature Granulocytes: 0 %
Lymphocytes Absolute: 2.7 10*3/uL (ref 0.7–3.1)
Lymphs: 27 %
MCH: 28.4 pg (ref 26.6–33.0)
MCHC: 34.4 g/dL (ref 31.5–35.7)
MCV: 83 fL (ref 79–97)
Monocytes Absolute: 0.6 10*3/uL (ref 0.1–0.9)
Monocytes: 6 %
Neutrophils Absolute: 6.5 10*3/uL (ref 1.4–7.0)
Neutrophils: 63 %
Platelets: 346 10*3/uL (ref 150–379)
RBC: 5.03 x10E6/uL (ref 3.77–5.28)
RDW: 13.1 % (ref 12.3–15.4)
WBC: 10.3 10*3/uL (ref 3.4–10.8)

## 2017-10-23 LAB — LIPID PANEL
Chol/HDL Ratio: 4.7 ratio — ABNORMAL HIGH (ref 0.0–4.4)
Cholesterol, Total: 228 mg/dL — ABNORMAL HIGH (ref 100–199)
HDL: 49 mg/dL (ref >39)
LDL Calculated: 136 mg/dL — ABNORMAL HIGH (ref 0–99)
Triglycerides: 213 mg/dL — ABNORMAL HIGH (ref 0–149)
VLDL Cholesterol Cal: 43 mg/dL — ABNORMAL HIGH (ref 5–40)

## 2017-10-23 LAB — CMP14+EGFR
ALT: 12 IU/L (ref 0–32)
AST: 16 IU/L (ref 0–40)
Albumin/Globulin Ratio: 1.6 (ref 1.2–2.2)
Albumin: 4.6 g/dL (ref 3.5–5.5)
Alkaline Phosphatase: 52 IU/L (ref 39–117)
BUN/Creatinine Ratio: 17 (ref 9–23)
BUN: 15 mg/dL (ref 6–24)
Bilirubin Total: 0.3 mg/dL (ref 0.0–1.2)
CO2: 25 mmol/L (ref 20–29)
Calcium: 9.7 mg/dL (ref 8.7–10.2)
Chloride: 100 mmol/L (ref 96–106)
Creatinine, Ser: 0.86 mg/dL (ref 0.57–1.00)
GFR calc Af Amer: 98 mL/min/{1.73_m2} (ref >59)
GFR calc non Af Amer: 85 mL/min/{1.73_m2} (ref >59)
Globulin, Total: 2.9 g/dL (ref 1.5–4.5)
Glucose: 87 mg/dL (ref 65–99)
Potassium: 4 mmol/L (ref 3.5–5.2)
Sodium: 140 mmol/L (ref 134–144)
Total Protein: 7.5 g/dL (ref 6.0–8.5)

## 2017-10-23 LAB — LIPASE: Lipase: 56 U/L (ref 14–72)

## 2017-10-29 ENCOUNTER — Telehealth: Payer: Self-pay

## 2017-10-29 NOTE — Telephone Encounter (Signed)
CMA call regarding lab results   Patient did not answer but left a detailed message & to call back if have any questions  

## 2017-10-30 NOTE — Progress Notes (Signed)
Please share results

## 2017-10-31 MED FILL — IBUPROFEN 400 MG TAB: 400 | 5 days supply | Qty: 30 | Fill #0

## 2017-10-31 MED FILL — CHLORHEXIDINE 0.12% RINSE: 0.12 | 30 days supply | Qty: 473 | Fill #0

## 2017-11-14 ENCOUNTER — Telehealth: Payer: Self-pay | Admitting: *Deleted

## 2017-11-14 NOTE — Telephone Encounter (Signed)
Notes recorded by Tresa Garter, MD on 10/23/2017 at 11:54 AM EST Results are within normal limits except for elevated cholesterol. To address this please limit saturated fat to no more than 7% of your calories, limit cholesterol to 200 mg/day, increase fiber and exercise as tolerated. No Diabetes  Interpreter assistance provided by [employer], [place]: [phone] Pt aware of results and result note.  She asked if she needs medication for cholesterol.

## 2017-11-19 NOTE — Telephone Encounter (Signed)
His note indicates he wanted her to work on lifestyle modifications.

## 2017-12-02 ENCOUNTER — Encounter (HOSPITAL_COMMUNITY): Payer: Self-pay | Admitting: Emergency Medicine

## 2017-12-02 ENCOUNTER — Ambulatory Visit (HOSPITAL_COMMUNITY)
Admission: EM | Admit: 2017-12-02 | Discharge: 2017-12-02 | Disposition: A | Discharge status: Home / Self Care | Payer: Self-pay | Attending: Family Medicine | Admitting: Family Medicine

## 2017-12-02 DIAGNOSIS — Z79899 Other long term (current) drug therapy: Secondary | ICD-10-CM | POA: Insufficient documentation

## 2017-12-02 DIAGNOSIS — R51 Headache: Secondary | ICD-10-CM | POA: Insufficient documentation

## 2017-12-02 DIAGNOSIS — Z3202 Encounter for pregnancy test, result negative: Secondary | ICD-10-CM

## 2017-12-02 DIAGNOSIS — G510 Bell's palsy: Secondary | ICD-10-CM | POA: Insufficient documentation

## 2017-12-02 DIAGNOSIS — K219 Gastro-esophageal reflux disease without esophagitis: Secondary | ICD-10-CM | POA: Insufficient documentation

## 2017-12-02 DIAGNOSIS — R002 Palpitations: Secondary | ICD-10-CM | POA: Insufficient documentation

## 2017-12-02 DIAGNOSIS — R110 Nausea: Secondary | ICD-10-CM | POA: Insufficient documentation

## 2017-12-02 DIAGNOSIS — R200 Anesthesia of skin: Secondary | ICD-10-CM | POA: Insufficient documentation

## 2017-12-02 DIAGNOSIS — R42 Dizziness and giddiness: Secondary | ICD-10-CM

## 2017-12-02 LAB — POCT I-STAT, CHEM 8
BUN: 10 mg/dL (ref 6–20)
Calcium, Ion: 1.14 mmol/L — ABNORMAL LOW (ref 1.15–1.40)
Chloride: 102 mmol/L (ref 101–111)
Creatinine, Ser: 0.6 mg/dL (ref 0.44–1.00)
Glucose, Bld: 95 mg/dL (ref 65–99)
HCT: 45 % (ref 36.0–46.0)
Hemoglobin: 15.3 g/dL — ABNORMAL HIGH (ref 12.0–15.0)
Potassium: 3.9 mmol/L (ref 3.5–5.1)
Sodium: 138 mmol/L (ref 135–145)
TCO2: 25 mmol/L (ref 22–32)

## 2017-12-02 LAB — TSH: TSH: 3.216 u[IU]/mL (ref 0.350–4.500)

## 2017-12-02 LAB — POCT URINALYSIS DIP (DEVICE)
Bilirubin Urine: NEGATIVE
Glucose, UA: NEGATIVE mg/dL
Ketones, ur: NEGATIVE mg/dL
Leukocytes, UA: NEGATIVE
Nitrite: NEGATIVE
Protein, ur: NEGATIVE mg/dL
Specific Gravity, Urine: 1.01 (ref 1.005–1.030)
Urobilinogen, UA: 0.2 mg/dL (ref 0.0–1.0)
pH: 5.5 (ref 5.0–8.0)

## 2017-12-02 LAB — POCT PREGNANCY, URINE: Preg Test, Ur: NEGATIVE

## 2017-12-02 MED ORDER — ONDANSETRON 4 MG PO TBDP
4.0000 mg | ORAL_TABLET | Freq: Once | ORAL | Status: AC
  Administered 2017-12-02: 4 mg via ORAL

## 2017-12-02 MED ORDER — ONDANSETRON 4 MG PO TBDP
ORAL_TABLET | ORAL | Status: AC
  Filled 2017-12-02: qty 1

## 2017-12-02 MED ORDER — ONDANSETRON HCL 4 MG PO TABS: 4.0000 mg | ORAL_TABLET | Freq: Three times a day (TID) | ORAL | 0 refills | Status: DC | PRN

## 2017-12-02 MED ORDER — KETOROLAC TROMETHAMINE 60 MG/2ML IM SOLN
INTRAMUSCULAR | Status: AC
  Filled 2017-12-02: qty 2

## 2017-12-02 MED ORDER — IBUPROFEN 800 MG PO TABS: 800.0000 mg | ORAL_TABLET | Freq: Three times a day (TID) | ORAL | 0 refills | Status: DC

## 2017-12-02 MED ORDER — KETOROLAC TROMETHAMINE 60 MG/2ML IM SOLN
60.0000 mg | Freq: Once | INTRAMUSCULAR | Status: AC
  Administered 2017-12-02: 60 mg via INTRAMUSCULAR

## 2017-12-02 NOTE — Discharge Instructions (Signed)
Drink plenty of water to ensure adequate hydration. Ibuprofen as needed for headache, may take dose tomorrow if still with headache. Your test results today look fine. I recommend making an appointment with your primary care provider for a recheck of your symptoms in 1 week, as additional lab tests may be warranted. If your symptoms worsen, increased dizziness, passing out, chest pain or palpitations, weakness, numbness or tingling please return to be seen or go to the er.

## 2017-12-02 NOTE — ED Triage Notes (Signed)
Spanish interpreter used for triage.   PT reports dizziness with numbness in mouth and  Both hands. PT reports headaches and generalized weakness as well.   This has been going on for 1 week.

## 2017-12-02 NOTE — ED Provider Notes (Signed)
[place]    CSN: [id number] Arrival date & time: 12/02/17  1646     History   Chief Complaint Chief Complaint  Patient presents with  . Dizziness    HPI Alicia Miller is a 42 y.o. female.   [name] presents with complaints of episodes of dizziness, hot flashes, nausea, headache, and numbness to hand and mouth which have been occurring intermittently for 1 week. Spanish interpreter used to collect history and physical. She states she has had similar episodes in the past, has been months now, has not been evaluated for this. At times of episodes also feels palpitations. Currently denies palpitations. Denies chest pain or shortness of breath. Currently feels fatigued, mouth numbness and mildly dizzy. States had a headache which improved approximately 1 hour ago. Mild nausea currently. Does not take any medications, has not taken any medications for current symptoms. Without vomiting or diarrhea. Denies urinary symptoms. LMP 1/5. Denies vaginal symptoms or bleeding. History of uterine cancer, bell's palsy, gerd. Per chart review patient does have imitrex as historical medication presumably for migraine headaches, patient states she does not take this.    ROS per HPI.       Past Medical History:  Diagnosis Date  . 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 Cancer (HCC)    Uterine  . Depression   . Left-sided Bell's palsy 01/19/2016  . No pertinent past medical history   . Varicose veins of left leg with edema     Patient Active Problem List   Diagnosis Date Noted  . Epigastric pain 10/22/2017  . Acute pain of right shoulder 10/08/2017  . Nevus, non-neoplastic 08/06/2017  . Dermatofibroma 08/06/2017  . Seborrheic keratoses 06/12/2017  . 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 Pruritic erythematous rash 06/12/2017  . Varicose veins of left lower extremity with  complications 81/82/9937  . Pain in left eye 04/10/2017  . Positive H. pylori test 12/12/2016  . Laryngospasm 12/10/2016  . Right lower quadrant abdominal pain 11/07/2016  . Vitamin D insufficiency 08/16/2016  . Joint laxity of right knee 08/15/2016  . Chronic pain of left ankle 08/15/2016  . Chronic pain of right ankle 08/15/2016  . Other fatigue 08/15/2016  . Stress incontinence, female 02/12/2016  . Breast tenderness in female 02/12/2016  . Microscopic hematuria 08/11/2015  . Allergic rhinitis 08/11/2015  . Snoring 08/11/2015  . Laryngopharyngeal reflux 08/11/2015  . Biliary calculi 06/13/2015  . Back muscle spasm 04/22/2014    Past Surgical History:  Procedure Laterality Date  . CHOLECYSTECTOMY  05/2015    OB History    Gravida Para Term Preterm AB Living   2 2 2  0 0 2   SAB TAB Ectopic Multiple Live Births   0 0   0 2       Home Medications    Prior to Admission medications   Medication Sig Start Date End Date Taking? Authorizing Provider  acetaminophen-codeine (TYLENOL #3) 300-30 MG tablet Take 1 tablet by mouth every 4 (four) hours as needed. 10/22/17   Tresa Garter, MD  cetirizine (ZYRTEC) 10 MG tablet Take 1 tablet (10 mg total) by mouth daily. 08/22/17   Arnoldo Morale, MD  diclofenac sodium (VOLTAREN) 1 % GEL Apply 4 g topically 4 (four) times daily. 10/22/17   Tresa Garter, MD  Elastic Bandages & Supports (MEDICAL COMPRESSION STOCKINGS) MISC Knee high compression  stockings 25-30 mm Hg pressure wear daily 04/10/17   Boykin Nearing, MD  ibuprofen (ADVIL,MOTRIN) 800 MG tablet Take 1 tablet (800 mg total) by mouth 3 (three) times daily. 12/02/17   Zigmund Gottron, NP  Multiple Vitamins-Minerals (MULTIVITAMIN WITH MINERALS) tablet Take 1 tablet by mouth daily. Reported on 01/19/2016    [provider]  ondansetron (ZOFRAN) 4 MG tablet Take 1 tablet (4 mg total) by mouth every 8 (eight) hours as needed for nausea or vomiting. 12/02/17   Zigmund Gottron, NP  pantoprazole (PROTONIX) 40 MG tablet Take 1 tablet (40 mg total) by mouth daily with supper. 10/22/17   Tresa Garter, MD  SUMAtriptan (IMITREX) 20 MG/ACT nasal spray Spray once into L nares for cluster headache. May repeat in 2 hours if headache persists or recurs. Max 2 doses in 7 day period. Patient not taking: Reported on 10/08/2017 04/10/17   Boykin Nearing, MD    Family History Family History  Problem Relation Age of Onset  . Diabetes Father   . Cancer Sister        uterine  . Diabetes Paternal Grandmother   . Diabetes Paternal Uncle   . Diabetes Paternal Uncle   . Diabetes Paternal Uncle   . Diabetes Paternal Uncle   . Diabetes Paternal Uncle   . Diabetes Paternal Uncle     Social History Social History   Tobacco Use  . Smoking status: Never Smoker  . Smokeless tobacco: Never Used  Substance Use Topics  . Alcohol use: No  . Drug use: No     Allergies   Bactrim [sulfamethoxazole-trimethoprim]   Review of Systems Review of Systems   Physical Exam Triage Vital Signs ED Triage Vitals  Enc Vitals Group     BP 12/02/17 1709 129/82     Pulse Rate 12/02/17 1709 75     Resp 12/02/17 1709 16     Temp 12/02/17 1709 97.9 F (36.6 C)     Temp Source 12/02/17 1709 Oral     SpO2 12/02/17 1709 99 %     Weight 12/02/17 1713 144 lb (65.3 kg)     Height --      Head Circumference --      Peak Flow --      Pain Score 12/02/17 1713 8     Pain Loc --      Pain Edu? --      Excl. in [place]? --    No data found.  Updated Vital Signs BP 129/82   Pulse 75   Temp 97.9 F (36.6 C) (Oral)   Resp 16   Wt 144 lb (65.3 kg)   LMP 11/22/2017   SpO2 99%   BMI 26.34 kg/m   Visual Acuity Right Eye Distance:   Left Eye Distance:   Bilateral Distance:    Right Eye Near:   Left Eye Near:    Bilateral Near:     Physical Exam  Constitutional: She is oriented to person, place, and time. She appears well-developed and well-nourished. No distress.    HENT:  Head: Normocephalic and atraumatic.  Right Ear: Tympanic membrane, external ear and ear canal normal.  Left Ear: Tympanic membrane, external ear and ear canal normal.  Nose: Nose normal.  Mouth/Throat: Uvula is midline, oropharynx is clear and moist and mucous membranes are normal. No tonsillar exudate.  Eyes: Conjunctivae and EOM are normal. Pupils are equal, round, and reactive to light.  Neck: Normal range of motion.  Cardiovascular:  Normal rate, regular rhythm and normal heart sounds.  Pulmonary/Chest: Effort normal and breath sounds normal.  Neurological: She is alert and oriented to person, place, and time. She displays normal reflexes. No cranial nerve deficit or sensory deficit. She exhibits normal muscle tone. Coordination normal.  Skin: Skin is warm and dry.   EKG NSR rate 64 without acute abnormalities.   UC Treatments / Results  Labs (all labs ordered are listed, but only abnormal results are displayed) Labs Reviewed  POCT URINALYSIS DIP (DEVICE) - Abnormal; Notable for the following components:      Result Value   Hgb urine dipstick SMALL (*)    All other components within normal limits  POCT I-STAT, CHEM 8 - Abnormal; Notable for the following components:   Calcium, Ion 1.14 (*)    Hemoglobin 15.3 (*)    All other components within normal limits  TSH  POCT PREGNANCY, URINE    EKG  EKG Interpretation None       Radiology No results found.  Procedures Procedures (including critical care time)  Medications Ordered in UC Medications  ondansetron (ZOFRAN-ODT) disintegrating tablet 4 mg (4 mg Oral Given 12/02/17 1820)  ketorolac (TORADOL) injection 60 mg (60 mg Intramuscular Given 12/02/17 1843)     Initial Impression / Assessment and Plan / UC Course  I have reviewed the triage vital signs and the nursing notes.  Pertinent labs & imaging results that were available during my care of the patient were reviewed by me and considered in my medical  decision making (see chart for details).     Patient states that nausea, dizziness and numbness have resolved. Headache has improved and almost all the way gone. Without acute neurological findings. Labs without acute findings at this time, tsh pending. ekg without acute abnormalities. Question migraine as contributing to symptoms. Recommend follow up with PCP as may require additional work up if symptoms persist, as this may also be endocrine in nature potentially. Return precautions provided. Patient verbalized understanding and agreeable to plan.    Final Clinical Impressions(s) / UC Diagnoses   Final diagnoses:  Dizziness    ED Discharge Orders        Ordered    ibuprofen (ADVIL,MOTRIN) 800 MG tablet  3 times daily     12/02/17 1914    ondansetron (ZOFRAN) 4 MG tablet  Every 8 hours PRN     12/02/17 1914       Controlled Substance Prescriptions [place] Controlled Substance Registry consulted? Not Applicable   Zigmund Gottron, NP 12/02/17 6979

## 2017-12-03 MED FILL — ?ONDANSETRON HCL 4MG TABLET: 4 | 6 days supply | Qty: 20 | Fill #0

## 2017-12-03 MED FILL — IBUPROFEN 800 MG TABLET: 800 | 7 days supply | Qty: 21 | Fill #0

## 2017-12-03 NOTE — Progress Notes (Signed)
Patient ID: Alicia Miller, female   DOB: [birth year], 42 y.o.   MRN: [id number]     Alicia Miller, is a 42 y.o. female  FYB:[id number]  NID:[id number]  DOB - 1976-02-21  Subjective:  Chief Complaint and HPI: Alicia Miller is a 42 y.o. female here today for a follow up visit After being seen in the urgent care for dizziness, hot flashes, nausea, headache, numbness of hand and mouth.  EKG showed no acute findings.  Symptoms resolved with toradol and zofran.    She is continuing to have these symptoms.  She has been having these symptoms for about 2 weeks.  + nausea.  No vomiting.  No diarrhea/constipation. LMP 11/22/2017. +without a car for a while which has been stressful.  Parents are sick, and she worries about that at times. No other new stressors.  Dizziness is intermittent.  No precipitating or alleviating factors.  She c/o circumoral tingling and B hands tingling at times.  No further CP.  +neck tightness and occasional HA  "Ulice Dash" with [employer].   Urgent care notes and all recent labs reviewed.     ROS:   Constitutional:  No f/c, No night sweats, No unexplained weight loss. EENT:  No vision changes, No blurry vision, No hearing changes. No mouth, throat, or ear problems.  Respiratory: No cough, No SOB Cardiac: No CP, no palpitations GI:  No abd pain, No N/V/D. GU: No Urinary s/sx Musculoskeletal: No joint pain Neuro: + headache, +dizziness, no motor weakness.  Skin: No rash Endocrine:  No polydipsia. No polyuria.  Psych: Denies SI/HI  No problems updated.  ALLERGIES: Allergies  Allergen Reactions  . Bactrim [Sulfamethoxazole-Trimethoprim] Itching    PAST MEDICAL HISTORY: Past Medical History:  Diagnosis Date  . 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 Cancer (HCC)    Uterine  . Depression   . Left-sided Bell's palsy 01/19/2016  . No pertinent  past medical history   . Varicose veins of left leg with edema     MEDICATIONS AT HOME: Prior to Admission medications   Medication Sig Start Date End Date Taking? Authorizing Provider  diclofenac sodium (VOLTAREN) 1 % GEL Apply 4 g topically 4 (four) times daily. Patient not taking: Reported on 12/04/2017 10/22/17   Tresa Garter, MD  Elastic Bandages & Supports (MEDICAL COMPRESSION STOCKINGS) MISC Knee high compression stockings 25-30 mm Hg pressure wear daily Patient not taking: Reported on 12/04/2017 04/10/17   Boykin Nearing, MD  ibuprofen (ADVIL,MOTRIN) 800 MG tablet Take 1 tablet (800 mg total) by mouth 3 (three) times daily. 12/04/17   Argentina Donovan, PA-C  meclizine (ANTIVERT) 25 MG tablet Take 1 tablet (25 mg total) by mouth 3 (three) times daily as needed for dizziness. 12/04/17   Argentina Donovan, PA-C  methocarbamol (ROBAXIN) 500 MG tablet Take 1 tablet (500 mg total) by mouth every 8 (eight) hours as needed for muscle spasms. 12/04/17   Argentina Donovan, PA-C  Multiple Vitamins-Minerals (MULTIVITAMIN WITH MINERALS) tablet Take 1 tablet by mouth daily. Reported on 01/19/2016    [provider]  ondansetron (ZOFRAN) 4 MG tablet Take 1 tablet (4 mg total) by mouth every 8 (eight) hours as needed for nausea or vomiting. Patient not taking: Reported on 12/04/2017 12/02/17   Zigmund Gottron, NP  SUMAtriptan (IMITREX) 20 MG/ACT nasal spray Spray once into L nares for cluster  headache. May repeat in 2 hours if headache persists or recurs. Max 2 doses in 7 day period. Patient not taking: Reported on 10/08/2017 04/10/17   Boykin Nearing, MD     Objective:  EXAM:   Vitals:   12/04/17 1403  BP: 102/64  Pulse: 70  Resp: 16  Temp: 97.6 F (36.4 C)  TempSrc: Oral  SpO2: 99%  Weight: 145 lb 12.8 oz (66.1 kg)  Height: 5\' 2"  (1.575 m)    General appearance : A&OX3. NAD. Non-toxic-appearing HEENT: Atraumatic and Normocephalic.  PERRLA. EOM intact.  TM clear  B. Mouth-MMM, post pharynx WNL w/o erythema, No PND. Neck: supple, no JVD. No cervical lymphadenopathy. No thyromegaly Chest/Lungs:  Breathing-non-labored, Good air entry bilaterally, breath sounds normal without rales, rhonchi, or wheezing  CVS: S1 S2 regular, no murmurs, gallops, rubs  Extremities: Bilateral Lower Ext shows no edema, both legs are warm to touch with = pulse throughout +B trapezius spasm into neck Neurology:  CN II-XII grossly intact, Non focal.  Finger to nose/heel to shin WNL.   Psych:  TP linear. J/I WNL. Normal speech. Appropriate eye contact and affect.  Skin:  No Rash  Data Review Lab Results  Component Value Date   HGBA1C 5.4 10/22/2017   HGBA1C 5.3 01/19/2016     Assessment & Plan   1. Fatigue, unspecified type  2. Vitamin D deficiency hasn't been checked since 07/2016 - Vitamin D, 25-hydroxy  3. Muscle spasm - ibuprofen (ADVIL,MOTRIN) 800 MG tablet; Take 1 tablet (800 mg total) by mouth 3 (three) times daily.  Dispense: 21 tablet; Refill: 0 - methocarbamol (ROBAXIN) 500 MG tablet; Take 1 tablet (500 mg total) by mouth every 8 (eight) hours as needed for muscle spasms.  Dispense: 90 tablet; Refill: 0  4. Dizziness - meclizine (ANTIVERT) 25 MG tablet; Take 1 tablet (25 mg total) by mouth 3 (three) times daily as needed for dizziness.  Dispense: 30 tablet; Refill: 0  5.  Anxiety-a lot of these symptoms seem like anxiety/panic related.  I counseled her at length on self-care, proper diet, exercise, deep breathing techniques, adequate water intake, and rest.  I spent more than 63min face to face counseling her.  Consider SSRI or buspar with PCP if this persists.      Patient have been counseled extensively about nutrition and exercise  Return in about 3 weeks (around 12/25/2017) for Dr Doreene Burke for dizziness, muscle spasm.  The patient was given clear instructions to go to ER or return to medical center if symptoms don't improve, worsen or new problems  develop. The patient verbalized understanding. The patient was told to call to get lab results if they haven't heard anything in the next week.     Freeman Caldron, PA-C [facility], [place]   12/04/2017, 2:37 PM

## 2017-12-04 ENCOUNTER — Ambulatory Visit: Payer: Self-pay | Attending: Internal Medicine | Admitting: Physician Assistant

## 2017-12-04 VITALS — BP 102/64 | HR 70 | Temp 97.6°F | Resp 16 | Ht 62.0 in | Wt 145.8 lb

## 2017-12-04 DIAGNOSIS — Z8542 Personal history of malignant neoplasm of other parts of uterus: Secondary | ICD-10-CM | POA: Insufficient documentation

## 2017-12-04 DIAGNOSIS — R42 Dizziness and giddiness: Secondary | ICD-10-CM | POA: Insufficient documentation

## 2017-12-04 DIAGNOSIS — Z79899 Other long term (current) drug therapy: Secondary | ICD-10-CM | POA: Insufficient documentation

## 2017-12-04 DIAGNOSIS — M62838 Other muscle spasm: Secondary | ICD-10-CM | POA: Insufficient documentation

## 2017-12-04 DIAGNOSIS — E559 Vitamin D deficiency, unspecified: Secondary | ICD-10-CM | POA: Insufficient documentation

## 2017-12-04 DIAGNOSIS — F419 Anxiety disorder, unspecified: Secondary | ICD-10-CM | POA: Insufficient documentation

## 2017-12-04 DIAGNOSIS — R5383 Other fatigue: Secondary | ICD-10-CM | POA: Insufficient documentation

## 2017-12-04 MED ORDER — METHOCARBAMOL 500 MG PO TABS: 500.0000 mg | ORAL_TABLET | Freq: Three times a day (TID) | ORAL | 0 refills | Status: DC | PRN

## 2017-12-04 MED ORDER — IBUPROFEN 800 MG PO TABS: 800.0000 mg | ORAL_TABLET | Freq: Three times a day (TID) | ORAL | 0 refills | Status: DC

## 2017-12-04 MED ORDER — MECLIZINE HCL 25 MG PO TABS: 25.0000 mg | ORAL_TABLET | Freq: Three times a day (TID) | ORAL | 0 refills | Status: DC | PRN

## 2017-12-04 MED FILL — ?MECLIZINE 25MG TAB: 25 | 10 days supply | Qty: 30 | Fill #0

## 2017-12-04 MED FILL — METHOCARBAMOL 500 MG TABLET: 500 | 30 days supply | Qty: 90 | Fill #0

## 2017-12-04 NOTE — Patient Instructions (Signed)
Trastorno de ansiedad generalizada, en adultos  Generalized Anxiety Disorder, Adult  El trastorno de ansiedad generalizada (TAG) es un trastorno de salud mental. Las personas con esta afeccin se preocupan constantemente por los eventos de todos los das. A diferencia de la ansiedad normal, la preocupacin relacionada con el TAG no se produce por un evento especfico. Estas preocupaciones tampoco desaparecen ni mejoran con el tiempo. EL TAG interfiere con las funciones de la vida, incluidas las relaciones, el trabajo y la escuela.  El TAG puede variar de leve a grave. Las personas con TAG grave tienen intensas oleadas de ansiedad con sntomas fsicos (crisis de angustia).  Cules son las causas?  Se desconoce la causa exacta del TAG.  Qu incrementa el riesgo?  Es ms probable que esta afeccin se manifieste en:   Mujeres.   Las personas que tienen antecedentes familiares de trastornos de ansiedad.   Las personas que son muy tmidas.   Las personas que experimentan eventos muy estresantes en la vida, tales como la muerte de un ser querido.   Las personas que tienen un entorno familiar muy estresante.    Cules son los signos o los sntomas?  Con frecuencia, las personas que padecen el TAG se preocupan excesivamente por muchas cosas en la vida, tales como su salud y su familia. Tambin pueden experimentar una preocupacin desmedida por lo siguiente:   Hacer las cosas bien.   Llegar a tiempo.   Los desastres naturales.   Las amistades.    Los sntomas fsicos del TAG incluyen:   Fatiga.   Tensin muscular o contracciones musculares.   Temblores o agitacin.   Sobresaltarse con facilidad.   Sentir que el corazn late fuerte o est acelerado.   Sentir falta de aire o como que no se puede respirar profundamente.   Problemas para quedarse dormido o para seguir durmiendo.   Sudoracin.   Nuseas, diarrea o sndrome del intestino irritable (SII).   Dolores de cabeza.   Dificultad para concentrarse o  recordar hechos.   Agitacin.   Irritabilidad.    Cmo se diagnostica?  El mdico puede diagnosticar el TAG en funcin de los sntomas y los antecedentes mdicos. Se le realizar un examen fsico. El mdico le har preguntas especficas sobre sus sntomas, que incluyen qu tan graves son, cundo comenzaron y si van y vienen. Tambin puede hacerle preguntas sobre el consumo de alcohol o drogas, incluidos los medicamentos recetados. Su mdico podr derivarlo a un especialista de salud mental para ms evaluaciones.  Su mdico le realizar un examen exhaustivo y puede hacer pruebas adicionales para descartar otras posibles causas de sus sntomas.  Para recibir un diagnstico del TAG, una persona debe tener ansiedad que:   Est fuera de su control.   Afecte distintos aspectos de su vida, como el trabajo y las relaciones.   Cause angustia que le impida participar en sus actividades habituales.   Incluya al menos tres sntomas fsicos del TAG tales como inquietud, fatiga, dificultad para concentrarse, irritabilidad, tensin muscular o problemas para dormir.    Antes de que su mdico pueda confirmar un diagnstico del TAG, estos sntomas deben estar presentes ms das de los que no lo estn y deben tener una duracin de seis meses o ms.  Cmo se trata?  Las siguientes terapias se usan generalmente para tratar el TAG:   Medicamentos. Por lo general, los medicamentos antidepresivos se recetan para un control diario a largo plazo. Se pueden agregar medicamentos para la ansiedad en casos   graves, especialmente cuando ocurren crisis de angustia.   Psicoterapia (psicoanlisis). Determinados tipos de psicoterapia pueden ser tiles para tratar el TAG al brindar apoyo, educacin y orientacin. Entre las opciones se incluyen las siguientes:  ? Terapia cognitivo conductual (TCC). Las personas aprenden habilidades para sobrellevar situaciones y tcnicas para aliviar la ansiedad. Aprenden a identificar conductas y pensamientos  irreales o negativos, y a reemplazarlos por positivos.  ? Terapia de aceptacin y compromiso (acceptance and commitment therapy, ACT). Este tratamiento ensea a las personas a ser conscientes como una forma de lidiar con pensamientos y sentimientos no deseados.  ? Biorretroalimentacin. Este proceso lo capacita para controlar la respuesta del cuerpo (respuesta psicolgica) a travs de tcnicas de respiracin y mtodos de relajacin. Usted trabajar con un terapeuta mientras se usan mquinas para controlar sus sntomas fsicos.   Tcnicas para controlar el estrs. Estas incluyen yoga, meditacin y ejercicio.    Un especialista en salud mental puede ayudar a determinar qu tratamiento es el mejor para usted. Algunas personas pueden mejorar con solo un tipo de terapia. Sin embargo, otras personas requieren una combinacin de terapias.  Siga estas instrucciones en su casa:   Tome los medicamentos de venta libre y los recetados solamente como se lo haya indicado el mdico.   Trate de mantener una rutina normal.   Trate de anticipar situaciones estresantes y permita un tiempo adicional para controlarlas.   Participe de cualquier tcnica para autocalmarse o controlar el estrs segn las indicaciones de su mdico.   No se castigue ante los retrocesos o por no realizar progresos.   Trate de reconocer sus logros aunque sean pequeos.   Concurra a todas las visitas de seguimiento como se lo haya indicado el mdico. Esto es importante.  Comunquese con un mdico si:   Los sntomas no mejoran.   Los sntomas empeoran.   Tiene signos de depresin tales como:  ? Tristeza, mal humor o irritabilidad.  ? Ya no disfruta de actividades que le solan causar placer.  ? Cambios en el peso y o en sus hbitos de alimentacin.  ? Cambios en los hbitos de sueo.  ? Evita a amigos y familiares.  ? No tiene energa para realizar las tareas habituales.  ? Tiene sentimientos de culpa o de minusvala.  Solicite ayuda de inmediato  si:   Tiene pensamientos serios acerca de lastimarse a usted mismo o daar a otras personas.  Si alguna vez siente que puede lastimarse o lastimar a los dems, o piensa en poner fin a su vida, busque ayuda de inmediato. Puede dirigirse al servicio de urgencias ms cercano o comunicarse con:   El servicio de emergencias de su localidad (911 en los [place]).   Una lnea de asistencia al suicida y atencin en crisis, como la Lnea Nacional de Prevencin del Suicidio (National Suicide Prevention Lifeline) al 1-800-273-8255. Est disponible las 24 horas del da.    Resumen   El trastorno de ansiedad generalizada (TAG) es un trastorno de salud mental que implica preocupacin no causada por un evento especfico.   Con frecuencia, las personas que padecen el TAG se preocupan excesivamente por muchas cosas en la vida, tales como su salud y su familia.   El TAG puede causar sntomas fsicos tales como inquietud, dificultad para concentrarse, problemas para dormir, sudoracin frecuente, nuseas, diarrea, dolores de cabeza y temblores, o contracciones musculares.   Un especialista en salud mental puede ayudar a determinar qu tratamiento es el mejor para usted. Algunas personas pueden mejorar

## 2017-12-04 NOTE — Progress Notes (Signed)
Patient stated she have been feeling dizzy and nausea for one week.  Patient stated she feels weak especially in the morning after breakfast. Patient stated the urgent care center told her it can be due to her migraine.

## 2017-12-05 LAB — VITAMIN D 25 HYDROXY (VIT D DEFICIENCY, FRACTURES): Vit D, 25-Hydroxy: 17.9 ng/mL — ABNORMAL LOW (ref 30.0–100.0)

## 2017-12-08 ENCOUNTER — Telehealth: Payer: Self-pay

## 2017-12-08 ENCOUNTER — Ambulatory Visit (INDEPENDENT_AMBULATORY_CARE_PROVIDER_SITE_OTHER): Payer: Self-pay | Admitting: Vascular Surgery

## 2017-12-08 ENCOUNTER — Encounter: Payer: Self-pay | Admitting: Vascular Surgery

## 2017-12-08 ENCOUNTER — Other Ambulatory Visit: Payer: Self-pay | Admitting: Physician Assistant

## 2017-12-08 ENCOUNTER — Ambulatory Visit: Payer: Self-pay

## 2017-12-08 VITALS — BP 99/60 | HR 83 | Temp 99.3°F | Resp 18 | Ht 62.0 in | Wt 145.8 lb

## 2017-12-08 DIAGNOSIS — I83892 Varicose veins of left lower extremities with other complications: Secondary | ICD-10-CM

## 2017-12-08 HISTORY — PX: ENDOVENOUS ABLATION SAPHENOUS VEIN W/ LASER: SUR449

## 2017-12-08 MED ORDER — VITAMIN D (ERGOCALCIFEROL) 1.25 MG (50000 UNIT) PO CAPS: 50000.0000 [IU] | ORAL_CAPSULE | ORAL | 0 refills | Status: DC

## 2017-12-08 NOTE — Progress Notes (Signed)
Subjective:     Patient ID: Alicia Miller, female   DOB: 09-27-1976, 42 y.o.   MRN: [id number]  HPI This 42 year old female had laser ablation left great saphenous vein from the proximal calf to near the saphenofemoral junction +10-20 stab phlebectomy of painful varicosities performed under local tumescent anesthesia. A total of 2024 J of energy was utilized. She tolerated the procedures well.  Review of Systems     Objective:   Physical Exam LMP 11/22/2017        Assessment:     Well-tolerated laser ablation left great saphenous vein plus multiple stab phlebectomy of painful varicosities performed under local tumescent anesthesia    Plan:     Return in 1 week for venous duplex exam left leg to confirm closure left great saphenous vein and this will complete patient's treatment regimen

## 2017-12-08 NOTE — Telephone Encounter (Signed)
Pt return your call to get the lab result, please call her back

## 2017-12-08 NOTE — Telephone Encounter (Signed)
CMA called patient regarding lab result.   Spanish interpreter Garlon Hatchet [phone] interpret the lab result to patient.  Patient understood and aware to pick up Rx at her pharmacy.

## 2017-12-08 NOTE — Progress Notes (Signed)
Laser Ablation Procedure    Date: 12/08/2017   Kami Kube DOB:[birth year]  Consent signed: Yes    Surgeon:  Dr. Nelda Severe. Kellie Simmering  Procedure: Laser Ablation: left Greater Saphenous Vein  BP 99/60 (BP Location: Left Arm, Patient Position: Sitting, Cuff Size: Normal)   Pulse 83   Temp 99.3 F (37.4 C) (Oral)   Resp 18   Ht 5\' 2"  (1.575 m)   Wt 145 lb 12.8 oz (66.1 kg)   LMP 11/22/2017   BMI 26.67 kg/m   Tumescent Anesthesia: 400 cc 0.9% NaCl with 50 cc Lidocaine HCL 1%  and 15 cc 8.4% NaHCO3  Local Anesthesia: 5 cc Lidocaine HCL and NaHCO3 (ratio 2:1)  Pulsed Mode: 15 watts, 544ms delay, 1.0 duration  Total Energy: 2024 Joules             Total Pulses:   136             Total Time: 2:15    Stab Phlebectomy: 10-20 Sites: Calf  Patient tolerated procedure well  Notes: Cresenciano Genre, [place] translator from [place], was present in room with patient before,during and after procedure to explain informed consents, all communication during procedure, and instructions for post op care.    Description of Procedure:  After marking the course of the secondary varicosities, the patient was placed on the operating table in the supine position, and the left leg was prepped and draped in sterile fashion.   Local anesthetic was administered and under ultrasound guidance the saphenous vein was accessed with a micro needle and guide wire; then the mirco puncture sheath was placed.  A guide wire was inserted saphenofemoral junction , followed by a 5 french sheath.  The position of the sheath and then the laser fiber below the junction was confirmed using the ultrasound.  Tumescent anesthesia was administered along the course of the saphenous vein using ultrasound guidance. The patient was placed in Trendelenburg position and protective laser glasses were placed on patient and staff, and the laser was fired at 15 watts continuous mode advancing 1-28mm/second for a total of 2024 joules.   For  stab phlebectomies, local anesthetic was administered at the previously marked varicosities, and tumescent anesthesia was administered around the vessels.  Ten to 20 stab wounds were made using the tip of an 11 blade. And using the vein hook, the phlebectomies were performed using a hemostat to avulse the varicosities.  Adequate hemostasis was achieved.     Steri strips were applied to the stab wounds and ABD pads and thigh high compression stockings were applied.  Ace wrap bandages were applied over the phlebectomy sites and at the top of the saphenofemoral junction. Blood loss was less than 15 cc.  The patient ambulated out of the operating room having tolerated the procedure well.

## 2017-12-08 NOTE — Telephone Encounter (Signed)
CMA called patient and patient did not answer.   Spanish interpreter Jesus [phone] left a voicemail and callback number.

## 2017-12-08 NOTE — Telephone Encounter (Signed)
-----   Message from [place] Donovan, [place] sent at 12/08/2017  6:51 AM EST ----- Please call patient and let them that their vitamin D is low.  This can contribute to muscle aches, anxiety, fatigue, and depression.  I have sent a prescription to the pharmacy for them to take once a week.  We will recheck this level in 3-4 months.  Keep follow-up in a few weeks as planned. Thanks Freeman Caldron, PA-C

## 2017-12-15 ENCOUNTER — Ambulatory Visit (HOSPITAL_COMMUNITY)
Admission: RE | Admit: 2017-12-15 | Discharge: 2017-12-15 | Disposition: A | Discharge status: Home / Self Care | Payer: Self-pay | Source: Ambulatory Visit | Attending: Vascular Surgery | Admitting: Vascular Surgery

## 2017-12-15 ENCOUNTER — Encounter: Payer: Self-pay | Admitting: Vascular Surgery

## 2017-12-15 ENCOUNTER — Ambulatory Visit (INDEPENDENT_AMBULATORY_CARE_PROVIDER_SITE_OTHER): Payer: Self-pay | Admitting: Vascular Surgery

## 2017-12-15 VITALS — BP 110/74 | HR 76 | Temp 98.1°F | Resp 18 | Ht 62.0 in | Wt 145.8 lb

## 2017-12-15 DIAGNOSIS — I83892 Varicose veins of left lower extremities with other complications: Secondary | ICD-10-CM

## 2017-12-15 DIAGNOSIS — Z9889 Other specified postprocedural states: Secondary | ICD-10-CM | POA: Insufficient documentation

## 2017-12-15 NOTE — Progress Notes (Signed)
Subjective:     Patient ID: Alicia Miller, female   DOB: 1976-02-28, 42 y.o.   MRN: [id number]  HPI This 42 year old female returns 1 week post-laser ablation left great saphenous vein with multiple stab phlebectomy of painful varicosities. She had some moderate discomfort in the medial thigh which is improving. She has worn her elastic compression stockings and take an ibuprofen as instructed. She's had no distal edema.    Review of Systems     Objective:   Physical Exam BP 110/74 (BP Location: Left Arm, Patient Position: Sitting, Cuff Size: Normal)   Pulse 76   Temp 98.1 F (36.7 C) (Oral)   Resp 18   Ht 5\' 2"  (1.575 m)   Wt 145 lb 12.8 oz (66.1 kg)   LMP 11/22/2017   SpO2 100%   BMI 26.67 kg/m   Gen. well-developed well-nourished female no apparent distress alert and oriented 3 Lungs no rhonchi or wheezing Left leg with nicely healing stab phlebectomy sites below the knee. Mild discomfort to deep palpation over great saphenous vein with minimal ecchymosis. No distal edema noted. 3 posterior cells pedis pulse palpable.  Today I ordered a venous duplex exam the left leg which I reviewed and interpreted. There is no DVT. There is total closure left great saphenous vein up to near the saphenofemoral junction     Assessment:     Successful laser ablation left great saphenous vein with multiple stab phlebectomy of painful varicosities with excellent early results    Plan:     Patient return to see me on a when necessary basis

## 2017-12-24 ENCOUNTER — Ambulatory Visit: Payer: Self-pay | Attending: Internal Medicine | Admitting: Internal Medicine

## 2017-12-24 ENCOUNTER — Encounter: Payer: Self-pay | Admitting: Internal Medicine

## 2017-12-24 VITALS — BP 120/76 | HR 78 | Temp 98.5°F | Resp 16 | Wt 142.2 lb

## 2017-12-24 DIAGNOSIS — F329 Major depressive disorder, single episode, unspecified: Secondary | ICD-10-CM | POA: Insufficient documentation

## 2017-12-24 DIAGNOSIS — R42 Dizziness and giddiness: Secondary | ICD-10-CM | POA: Insufficient documentation

## 2017-12-24 DIAGNOSIS — Z9049 Acquired absence of other specified parts of digestive tract: Secondary | ICD-10-CM | POA: Insufficient documentation

## 2017-12-24 DIAGNOSIS — Z882 Allergy status to sulfonamides status: Secondary | ICD-10-CM | POA: Insufficient documentation

## 2017-12-24 DIAGNOSIS — J385 Laryngeal spasm: Secondary | ICD-10-CM | POA: Insufficient documentation

## 2017-12-24 DIAGNOSIS — Z79899 Other long term (current) drug therapy: Secondary | ICD-10-CM | POA: Insufficient documentation

## 2017-12-24 DIAGNOSIS — Z5189 Encounter for other specified aftercare: Secondary | ICD-10-CM | POA: Insufficient documentation

## 2017-12-24 DIAGNOSIS — E559 Vitamin D deficiency, unspecified: Secondary | ICD-10-CM | POA: Insufficient documentation

## 2017-12-24 DIAGNOSIS — R110 Nausea: Secondary | ICD-10-CM | POA: Insufficient documentation

## 2017-12-24 DIAGNOSIS — Z86001 Personal history of in-situ neoplasm of cervix uteri: Secondary | ICD-10-CM | POA: Insufficient documentation

## 2017-12-24 DIAGNOSIS — F419 Anxiety disorder, unspecified: Secondary | ICD-10-CM | POA: Insufficient documentation

## 2017-12-24 MED ORDER — PREDNISONE 20 MG PO TABS
20.0000 mg | ORAL_TABLET | Freq: Every day | ORAL | 0 refills | Status: AC
Start: 2017-12-24 — End: 2017-12-29

## 2017-12-24 MED FILL — predniSONE 20 MG TABS: 20 | 5 days supply | Qty: 5 | Fill #0

## 2017-12-24 NOTE — Progress Notes (Signed)
Subjective:  Patient ID: Alicia Miller, female    DOB: 01/23/1976  Age: 42 y.o. MRN: [id number]  CC: Follow-up (3 week)  HPI Alicia Miller is a 42 year old Hispanic female with a PMH of Depression and Vit D that presents for follow-up.  Three weeks ago, she was seen with complaints of dizziness, muscle spasms, anxiety, and nausea. During the visit, she was found to be deficient in.Vitamin D. Vitamin was prescribed and since then she reports resolution of all symptoms. She reports feeling much better.   She c/o of on-going throat pain with blood-tinged phlegm. She says this has been an issue for the past 2-3 years. There are times this makes her feel SOB and feels like she has the phlegm stuck in her throat and cannot be cleared. Recently she felt some relief with some allergy medicine she was prescribed, but it came back. She was referred to ENT in the past but was never called.   She denies chest pain, SOB, or fevers at this time.   Past Medical History:  Diagnosis Date  . 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 Cancer (HCC)    Uterine  . Depression   . Left-sided Bell's palsy 01/19/2016  . No pertinent past medical history   . Varicose veins of left leg with edema    Past Surgical History:  Procedure Laterality Date  . CHOLECYSTECTOMY  05/2015  . ENDOVENOUS ABLATION SAPHENOUS VEIN W/ LASER Left 12/08/2017   endovenous laser ablation left greater saphenous vein and stab phlebectomy 10-20 incisions left leg by Tinnie Gens MD    Allergies  Allergen Reactions  . Bactrim [Sulfamethoxazole-Trimethoprim] Itching    Outpatient Medications Prior to Visit  Medication Sig Dispense Refill  . diclofenac sodium (VOLTAREN) 1 % GEL Apply 4 g topically 4 (four) times daily. (Patient not taking: Reported on 12/04/2017) 1 Tube 1  . Elastic Bandages & Supports (MEDICAL COMPRESSION STOCKINGS)  MISC Knee high compression stockings 25-30 mm Hg pressure wear daily 2 each 0  . ibuprofen (ADVIL,MOTRIN) 800 MG tablet Take 1 tablet (800 mg total) by mouth 3 (three) times daily. 21 tablet 0  . meclizine (ANTIVERT) 25 MG tablet Take 1 tablet (25 mg total) by mouth 3 (three) times daily as needed for dizziness. 30 tablet 0  . methocarbamol (ROBAXIN) 500 MG tablet Take 1 tablet (500 mg total) by mouth every 8 (eight) hours as needed for muscle spasms. 90 tablet 0  . Multiple Vitamins-Minerals (MULTIVITAMIN WITH MINERALS) tablet Take 1 tablet by mouth daily. Reported on 01/19/2016    . ondansetron (ZOFRAN) 4 MG tablet Take 1 tablet (4 mg total) by mouth every 8 (eight) hours as needed for nausea or vomiting. (Patient not taking: Reported on 12/04/2017) 20 tablet 0  . SUMAtriptan (IMITREX) 20 MG/ACT nasal spray Spray once into L nares for cluster headache. May repeat in 2 hours if headache persists or recurs. Max 2 doses in 7 day period. (Patient not taking: Reported on 10/08/2017) 1 Inhaler 0  . Vitamin D, Ergocalciferol, (DRISDOL) 50000 units CAPS capsule Take 1 capsule (50,000 Units total) by mouth every 7 (seven) days. 16 capsule 0   No facility-administered medications prior to visit.     ROS Review of Systems  Constitutional: Negative.   HENT: Positive for rhinorrhea and sore throat. Negative for trouble swallowing.   Respiratory: Positive for shortness of  breath. Negative for cough.        Occasional   Cardiovascular: Negative for chest pain.  Musculoskeletal: Negative.   Allergic/Immunologic: Negative for food allergies and immunocompromised state.  Neurological: Negative.     Objective:  BP 120/76   Pulse 78   Temp 98.5 F (36.9 C) (Oral)   Resp 16   Wt 142 lb 3.2 oz (64.5 kg)   SpO2 98%   BMI 26.01 kg/m   BP/Weight 12/24/2017 12/15/2017 1/74/0814  Systolic BP 481 856 99  Diastolic BP 76 74 60  Wt. (Lbs) 142.2 145.8 145.8  BMI 26.01 26.67 26.67    Physical Exam    Constitutional: She appears well-developed and well-nourished.  HENT:  Head: Normocephalic and atraumatic.  Nose: Nose normal.  Mouth/Throat: Uvula is midline and oropharynx is clear and moist. No uvula swelling. No tonsillar abscesses.  Neck: Normal range of motion. Neck supple.  Cardiovascular: Normal rate, regular rhythm and normal heart sounds.  Pulmonary/Chest: Effort normal and breath sounds normal.  Vitals reviewed.   Assessment & Plan:   1. Vitamin D deficiency Continue Vitamin D supplement, repeat Vitamin D level in 6 months.  2. Laryngospasm - Ambulatory referral to ENT - predniSONE (DELTASONE) 20 MG tablet; Take 1 tablet (20 mg total) by mouth daily with breakfast for 5 days.  Dispense: 5 tablet; Refill: 0   Meds ordered this encounter  Medications  . predniSONE (DELTASONE) 20 MG tablet    Sig: Take 1 tablet (20 mg total) by mouth daily with breakfast for 5 days.    Dispense:  5 tablet    Refill:  0    Follow-up: Return in about 6 months (around 06/23/2018), or if symptoms worsen or fail to improve, for repeat Vit D level.   Jonnie Kind DNP Student   Evaluation and management procedures were performed by me with DNP Student in attendance, note written by DNP student under my supervision and collaboration. I have reviewed the note and I agree with the management and plan.   Angelica Chessman, MD, MHA, [place], Karilyn Cota [facility] [place], [place]   12/25/2017, 3:51 PM

## 2017-12-24 NOTE — Patient Instructions (Signed)
Deficiencia de vitaminaD (Vitamin D Deficiency) La deficiencia de vitaminaD ocurre cuando el organismo no tiene suficiente vitaminaD. La vitaminaD es importante para el organismo por muchos motivos:  Ayuda al organismo a Engineer, production minerales llamados calcio y fsforo.  Desempea un papel en la salud de los [place].  Puede ayudar a prevenir [employer], como la diabetes y Curator.  Desempea un papel en la funcin muscular, incluida la actividad cardaca. Para obtener vitaminaD, puede hacer lo siguiente:  Consuma alimentos que contengan naturalmente vitaminaD.  Consuma o beba leche u otros productos lcteos con agregado de vitaminaD.  Tome un suplemento de vitaminaD o un suplemento multivitamnico que la contenga.  Expngase al sol. El organismo produce vitaminaD de forma natural cuando se expone la piel a la luz del sol. El organismo transforma la luz del sol en una forma de vitamina que puede Forbes. Si la deficiencia de vitaminaD es grave, puede causar una enfermedad que [employer]. En los adultos, se la conoce como osteomalacia. En los nios, recibe el nombre de raquitismo. CAUSAS La deficiencia de vitaminaD puede deberse a lo siguiente:  No comer suficientes alimentos que contengan vitamina D.  Exposicin insuficiente al sol.  Sufrir Actuary del sistema digestivo que le dificultan al organismo la absorcin de vitaminaD. Estas enfermedades incluyen la enfermedad de Crohn, la pancreatitis crnica y la fibrosis [place].  Someterse a una [place] se extirpa Ardelia Mems parte del estmago o del intestino delgado.  Ser obeso.  Tener enfermedad renal crnica o enfermedad heptica. FACTORES DE RIESGO Es ms probable que esta afeccin se manifieste en:  Las personas de edad [place].  Las [employer] no pasan mucho tiempo al [employer].  Las personas que viven en un centro de atencin de Building services engineer.  Las  personas que sufrieron fracturas seas.  Las personas que tienen huesos dbiles o delgados (osteoporosis).  Las personas que sufren una enfermedad o un trastorno que modifica la forma en que el organismo absorbe la vitaminaD.  Las personas de [employer].  Las personas que toman algunos medicamentos, como corticoides o determinados anticonvulsivos.  Las personas con sobrepeso u obesidad. SNTOMAS En los casos leves de deficiencia de vitaminaD, puede no haber sntomas. Si el trastorno es grave, los sntomas pueden incluir lo siguiente:  [employer].  Dolor muscular.  Caerse con frecuencia.  Huesos fracturados por Primary school teacher. DIAGNSTICO Por lo general, este trastorno se diagnostica mediante un anlisis de [place]. TRATAMIENTO El tratamiento de este trastorno puede depender de la causa. Entre las otras opciones de [place], se incluyen las siguientes:  Tomar suplementos de vitamina D.  Tomar un suplemento de calcio. El Statistician cul es la dosis ms [place] para usted. [place] los medicamentos y los suplementos solamente como se lo haya indicado el mdico.  Consuma alimentos que contengan vitaminaD, como por ejemplo: ? Productos lcteos, cereales o jugos fortificados. El trmino fortificado significa que se ha aadido vitaminaD al [employer]. Revise la etiqueta del paquete para estar seguro. ? Pescados grasos, como el salmn o la [place]. ? Huevos. ? Ostras.  No utilice camas solares.  Mantenga un peso saludable. Baje de peso, si es necesario.  Concurra a todas las visitas de control como se lo haya indicado el mdico. Esto es importante. SOLICITE ATENCIN MDICA SI:  Los sntomas no desaparecen.  Tiene malestar estomacal (nuseas) o vomita.  Defeca menos que lo habitual o  le resulta difcil defecar (estreimiento). Esta informacin no tiene Marine scientist el consejo del mdico. Asegrese de  hacerle al mdico cualquier pregunta que tenga. Document Released: 01/27/2012 Document Revised: 07/26/2015 Document Reviewed: 03/22/2015 Elsevier Interactive Patient Education  2018 [employer].

## 2018-01-22 ENCOUNTER — Ambulatory Visit (INDEPENDENT_AMBULATORY_CARE_PROVIDER_SITE_OTHER): Payer: Self-pay | Admitting: Otolaryngology

## 2018-01-30 ENCOUNTER — Ambulatory Visit: Payer: Self-pay | Attending: Internal Medicine

## 2018-02-19 ENCOUNTER — Ambulatory Visit: Payer: Self-pay | Attending: Internal Medicine | Admitting: Physician Assistant

## 2018-02-19 ENCOUNTER — Ambulatory Visit (INDEPENDENT_AMBULATORY_CARE_PROVIDER_SITE_OTHER): Payer: Self-pay | Admitting: Otolaryngology

## 2018-02-19 VITALS — BP 103/70 | HR 70 | Temp 98.5°F | Resp 16 | Ht 62.0 in | Wt 142.6 lb

## 2018-02-19 DIAGNOSIS — Z79899 Other long term (current) drug therapy: Secondary | ICD-10-CM | POA: Insufficient documentation

## 2018-02-19 DIAGNOSIS — R42 Dizziness and giddiness: Secondary | ICD-10-CM | POA: Insufficient documentation

## 2018-02-19 DIAGNOSIS — R070 Pain in throat: Secondary | ICD-10-CM

## 2018-02-19 DIAGNOSIS — F329 Major depressive disorder, single episode, unspecified: Secondary | ICD-10-CM | POA: Insufficient documentation

## 2018-02-19 DIAGNOSIS — Z791 Long term (current) use of non-steroidal anti-inflammatories (NSAID): Secondary | ICD-10-CM | POA: Insufficient documentation

## 2018-02-19 DIAGNOSIS — J301 Allergic rhinitis due to pollen: Secondary | ICD-10-CM | POA: Insufficient documentation

## 2018-02-19 DIAGNOSIS — K219 Gastro-esophageal reflux disease without esophagitis: Secondary | ICD-10-CM

## 2018-02-19 DIAGNOSIS — R1312 Dysphagia, oropharyngeal phase: Secondary | ICD-10-CM

## 2018-02-19 DIAGNOSIS — Z882 Allergy status to sulfonamides status: Secondary | ICD-10-CM | POA: Insufficient documentation

## 2018-02-19 DIAGNOSIS — Z8542 Personal history of malignant neoplasm of other parts of uterus: Secondary | ICD-10-CM | POA: Insufficient documentation

## 2018-02-19 MED ORDER — LORATADINE 10 MG PO TABS
10.0000 mg | ORAL_TABLET | Freq: Every day | ORAL | 11 refills | Status: DC
Start: 2018-02-19 — End: 2018-08-09

## 2018-02-19 MED ORDER — FLUTICASONE PROPIONATE 50 MCG/ACT NA SUSP: 2.0000 | Freq: Every day | NASAL | 6 refills | Status: DC

## 2018-02-19 MED FILL — FLUTICASONE PROP 50 MCG SPR: 50 | 30 days supply | Qty: 16 | Fill #0

## 2018-02-19 NOTE — Patient Instructions (Signed)
Phenylephrine 10mg  every 4 hours as needed for congestion

## 2018-02-19 NOTE — Progress Notes (Signed)
Pt. Stated she feels dizzy and tired.

## 2018-02-19 NOTE — Progress Notes (Signed)
Alicia Miller, is a 42 y.o. female  XAJ:[id number]  CNO:[id number]  DOB - May 04, 1976  Subjective:  Chief Complaint and HPI: Alicia Miller is a 42 y.o. female here today 2 week h/o fatigue and dizziness.  Feels this way on and off.  Some mild HA.  LMP 02/14/2018 and normal.  No CP/SOB.  No vision changes.  dizziness is intermittent and fleeting.    Also having allergy symptoms with sneezing and nasal congestion-took zyrtec and felt tired the next day after taking it.   Taking vitamin D once weekly.  [employer] interpreters "Glenard Haring" interpreting  No FH early problems with heart or neurological  ROS:   Constitutional:  No f/c, No night sweats, No unexplained weight loss. EENT:  No vision changes, No blurry vision, No hearing changes. +ear, nose, sinus congestion Respiratory: No cough, No SOB Cardiac: No CP, no palpitations GI:  No abd pain, No N/V/D. GU: No Urinary s/sx Musculoskeletal: No joint pain Neuro: No headache, + dizziness, no motor weakness.  Skin: No rash Endocrine:  No polydipsia. No polyuria.  Psych: Denies SI/HI  No problems updated.  ALLERGIES: Allergies  Allergen Reactions  . Bactrim [Sulfamethoxazole-Trimethoprim] Itching    PAST MEDICAL HISTORY: Past Medical History:  Diagnosis Date  . 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 Cancer (HCC)    Uterine  . Depression   . Left-sided Bell's palsy 01/19/2016  . No pertinent past medical history   . Varicose veins of left leg with edema     MEDICATIONS AT HOME: Prior to Admission medications   Medication Sig Start Date End Date Taking? Authorizing Provider  diclofenac sodium (VOLTAREN) 1 % GEL Apply 4 g topically 4 (four) times daily. 10/22/17  Yes Tresa Garter, MD  Elastic Bandages & Supports (MEDICAL COMPRESSION STOCKINGS) MISC Knee high compression stockings 25-30 mm Hg pressure wear daily  04/10/17  Yes Funches, Josalyn, MD  ibuprofen (ADVIL,MOTRIN) 800 MG tablet Take 1 tablet (800 mg total) by mouth 3 (three) times daily. 12/04/17  Yes Deago Burruss M, PA-C  meclizine (ANTIVERT) 25 MG tablet Take 1 tablet (25 mg total) by mouth 3 (three) times daily as needed for dizziness. 12/04/17  Yes Hayzlee Mcsorley, Dionne Bucy, PA-C  methocarbamol (ROBAXIN) 500 MG tablet Take 1 tablet (500 mg total) by mouth every 8 (eight) hours as needed for muscle spasms. 12/04/17  Yes Wylder Macomber, Dionne Bucy, PA-C  Multiple Vitamins-Minerals (MULTIVITAMIN WITH MINERALS) tablet Take 1 tablet by mouth daily. Reported on 01/19/2016   Yes [provider]  ondansetron (ZOFRAN) 4 MG tablet Take 1 tablet (4 mg total) by mouth every 8 (eight) hours as needed for nausea or vomiting. 12/02/17  Yes Augusto Gamble B, NP  SUMAtriptan (IMITREX) 20 MG/ACT nasal spray Spray once into L nares for cluster headache. May repeat in 2 hours if headache persists or recurs. Max 2 doses in 7 day period. 04/10/17  Yes Funches, Josalyn, MD  Vitamin D, Ergocalciferol, (DRISDOL) 50000 units CAPS capsule Take 1 capsule (50,000 Units total) by mouth every 7 (seven) days. 12/08/17  Yes Kaidyn Javid M, PA-C  fluticasone (FLONASE) 50 MCG/ACT nasal spray Place 2 sprays into both nostrils daily. 02/19/18   Argentina Donovan, PA-C  loratadine (CLARITIN) 10 MG tablet Take 1 tablet (10 mg total) by mouth daily. 02/19/18   Argentina Donovan, PA-C     Objective:  EXAM:  Vitals:   02/19/18 0923  BP: 103/70  Pulse: 70  Resp: 16  Temp: 98.5 F (36.9 C)  TempSrc: Oral  SpO2: 98%  Weight: 142 lb 9.6 oz (64.7 kg)  Height: 5\' 2"  (1.575 m)    General appearance : A&OX3. NAD. Non-toxic-appearing HEENT: Atraumatic and Normocephalic.  PERRLA. EOM intact.  TM full B. Mouth-MMM, post pharynx WNL w/o erythema, + PND.  Turbinates pale, boggy and enlarged.   Neck: supple, no JVD. No cervical lymphadenopathy. No thyromegaly Chest/Lungs:  Breathing-non-labored,  Good air entry bilaterally, breath sounds normal without rales, rhonchi, or wheezing  CVS: S1 S2 regular, no murmurs, gallops, rubs  Extremities: Bilateral Lower Ext shows no edema, both legs are warm to touch with = pulse throughout Neurology:  CN II-XII grossly intact, Non focal.  Normal finger to nose/hell to shin. Psych:  TP linear. J/I WNL. Normal speech. Appropriate eye contact and affect.  Skin:  No Rash  Data Review Lab Results  Component Value Date   HGBA1C 5.4 10/22/2017   HGBA1C 5.3 01/19/2016     Assessment & Plan   1. Dizziness No red flags.  Reviewed EKG and labs over last 6 months.  She has had extensive lab work over the last few month; no diabetes or electrolyte issues, etc.  Believe caused by #2.  To ED/call 911 if worsening/new s/sx develop.  Patient expresses understanding.    2. Allergic rhinitis due to pollen, unspecified seasonality - loratadine (CLARITIN) 10 MG tablet; Take 1 tablet (10 mg total) by mouth daily.  Dispense: 30 tablet; Refill: 11 - fluticasone (FLONASE) 50 MCG/ACT nasal spray; Place 2 sprays into both nostrils daily.  Dispense: 16 g; Refill: 6   Patient have been counseled extensively about nutrition and exercise  Return if symptoms worsen or fail to improve.  The patient was given clear instructions to go to ER or return to medical center if symptoms don't improve, worsen or new problems develop. The patient verbalized understanding. The patient was told to call to get lab results if they haven't heard anything in the next week.     Freeman Caldron, PA-C [facility] and [facility], [place]   02/19/2018, 9:42 AM

## 2018-03-13 ENCOUNTER — Ambulatory Visit: Payer: Self-pay | Attending: Internal Medicine

## 2018-03-16 ENCOUNTER — Ambulatory Visit: Payer: Self-pay

## 2018-03-25 ENCOUNTER — Encounter (HOSPITAL_COMMUNITY): Payer: Self-pay | Admitting: Emergency Medicine

## 2018-03-25 ENCOUNTER — Ambulatory Visit (HOSPITAL_COMMUNITY)
Admission: EM | Admit: 2018-03-25 | Discharge: 2018-03-25 | Disposition: A | Discharge status: Home / Self Care | Payer: Self-pay | Attending: Family Medicine | Admitting: Family Medicine

## 2018-03-25 DIAGNOSIS — R5383 Other fatigue: Secondary | ICD-10-CM

## 2018-03-25 DIAGNOSIS — J302 Other seasonal allergic rhinitis: Secondary | ICD-10-CM

## 2018-03-25 LAB — POCT I-STAT, CHEM 8
BUN: 12 mg/dL (ref 6–20)
Calcium, Ion: 1.11 mmol/L — ABNORMAL LOW (ref 1.15–1.40)
Chloride: 104 mmol/L (ref 101–111)
Creatinine, Ser: 0.6 mg/dL (ref 0.44–1.00)
Glucose, Bld: 96 mg/dL (ref 65–99)
HCT: 46 % (ref 36.0–46.0)
Hemoglobin: 15.6 g/dL — ABNORMAL HIGH (ref 12.0–15.0)
Potassium: 4.2 mmol/L (ref 3.5–5.1)
Sodium: 138 mmol/L (ref 135–145)
TCO2: 26 mmol/L (ref 22–32)

## 2018-03-25 LAB — POCT URINALYSIS DIP (DEVICE)
Bilirubin Urine: NEGATIVE
Glucose, UA: NEGATIVE mg/dL
Ketones, ur: NEGATIVE mg/dL
Leukocytes, UA: NEGATIVE
Nitrite: NEGATIVE
Protein, ur: NEGATIVE mg/dL
Specific Gravity, Urine: 1.01 (ref 1.005–1.030)
Urobilinogen, UA: 0.2 mg/dL (ref 0.0–1.0)
pH: 6 (ref 5.0–8.0)

## 2018-03-25 LAB — POCT PREGNANCY, URINE: Preg Test, Ur: NEGATIVE

## 2018-03-25 MED ORDER — LEVOCETIRIZINE DIHYDROCHLORIDE 5 MG PO TABS: 5.0000 mg | ORAL_TABLET | Freq: Every evening | ORAL | 0 refills | Status: DC

## 2018-03-25 MED ORDER — ALBUTEROL SULFATE HFA 108 (90 BASE) MCG/ACT IN AERS: 1.0000 | INHALATION_SPRAY | Freq: Four times a day (QID) | RESPIRATORY_TRACT | 0 refills | Status: DC | PRN

## 2018-03-25 MED FILL — ALBUTEROL SULFATE HFA 108 (: 108 (90 BAS | 25 days supply | Qty: 18 | Fill #0

## 2018-03-25 NOTE — ED Provider Notes (Signed)
[place]    CSN: [id number] Arrival date & time: 03/25/18  1200     History   Chief Complaint Chief Complaint  Patient presents with  . Fatigue    HPI Alicia Miller is a 42 y.o. female.   Alicia Miller presents with complaints of persistent fatigue as well as intermittent dizziness, chest pressure, which have been ongoing for the past year. Spanish video interpreter used to collect history and physical. Has been evaluated for dizziness and symptoms intermittently since January of 2019 without significant findings. Normal ekg. Vitamin D deficiency as well as question of anxiety and allergies attributed to source of dizziness. Has been prescribed loratdiine and cetrizine in the past but patient states these medications caused her body aches. Has seen ENT and was started on ranitidine. States yesterday while running and playing with her kids she felt short of breath and chest pressure which has improved today. She has had this in the past. No recent travel. No cough. No leg pain or swelling. Took vitamin d for 1 month, she is out of this medication so has not been taking. Has itchy mouth, eyes and throat at times. Otherwise without sneezing, congestion or cough. States while she is at home she will just feel like her "battery is low" suddenly and she has much fatigue. No known increase stress or loss in her life currently. Without any cardiac history.      ROS per HPI.      Past Medical History:  Diagnosis Date  . 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 Cancer (HCC)    Uterine  . Depression   . Left-sided Bell's palsy 01/19/2016  . No pertinent past medical history   . Varicose veins of left leg with edema     Patient Active Problem List   Diagnosis Date Noted  . Epigastric pain 10/22/2017  . Acute pain of right shoulder 10/08/2017  . Nevus, non-neoplastic 08/06/2017  .  Dermatofibroma 08/06/2017  . Seborrheic keratoses 06/12/2017  . 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 Pruritic erythematous rash 06/12/2017  . Varicose veins of left lower extremity with complications 89/38/1017  . Pain in left eye 04/10/2017  . Positive H. pylori test 12/12/2016  . Laryngospasm 12/10/2016  . Right lower quadrant abdominal pain 11/07/2016  . Vitamin D insufficiency 08/16/2016  . Joint laxity of right knee 08/15/2016  . Chronic pain of left ankle 08/15/2016  . Chronic pain of right ankle 08/15/2016  . Other fatigue 08/15/2016  . Stress incontinence, female 02/12/2016  . Breast tenderness in female 02/12/2016  . Microscopic hematuria 08/11/2015  . Allergic rhinitis 08/11/2015  . Snoring 08/11/2015  . Laryngopharyngeal reflux 08/11/2015  . Biliary calculi 06/13/2015  . Back muscle spasm 04/22/2014    Past Surgical History:  Procedure Laterality Date  . CHOLECYSTECTOMY  05/2015  . ENDOVENOUS ABLATION SAPHENOUS VEIN W/ LASER Left 12/08/2017   endovenous laser ablation left greater saphenous vein and stab phlebectomy 10-20 incisions left leg by Tinnie Gens MD     OB History    Gravida  2   Para  2   Term  2   Preterm  0   AB  0   Living  2     SAB  0   TAB  0   Ectopic      Multiple  0   Live Births  2  Home Medications    Prior to Admission medications   Medication Sig Start Date End Date Taking? Authorizing Provider  albuterol (PROVENTIL HFA;VENTOLIN HFA) 108 (90 Base) MCG/ACT inhaler Inhale 1-2 puffs into the lungs every 6 (six) hours as needed for wheezing or shortness of breath. 03/25/18   Augusto Gamble B, NP  diclofenac sodium (VOLTAREN) 1 % GEL Apply 4 g topically 4 (four) times daily. 10/22/17   Tresa Garter, MD  Elastic Bandages & Supports (MEDICAL COMPRESSION STOCKINGS) MISC Knee high compression stockings 25-30 mm Hg pressure wear daily 04/10/17   Funches, Adriana Mccallum, MD  fluticasone (FLONASE) 50 MCG/ACT nasal  spray Place 2 sprays into both nostrils daily. 02/19/18   Argentina Donovan, PA-C  ibuprofen (ADVIL,MOTRIN) 800 MG tablet Take 1 tablet (800 mg total) by mouth 3 (three) times daily. 12/04/17   Argentina Donovan, PA-C  levocetirizine (XYZAL) 5 MG tablet Take 1 tablet (5 mg total) by mouth every evening. 03/25/18   Zigmund Gottron, NP  loratadine (CLARITIN) 10 MG tablet Take 1 tablet (10 mg total) by mouth daily. 02/19/18   Argentina Donovan, PA-C  meclizine (ANTIVERT) 25 MG tablet Take 1 tablet (25 mg total) by mouth 3 (three) times daily as needed for dizziness. 12/04/17   Argentina Donovan, PA-C  methocarbamol (ROBAXIN) 500 MG tablet Take 1 tablet (500 mg total) by mouth every 8 (eight) hours as needed for muscle spasms. 12/04/17   Argentina Donovan, PA-C  Multiple Vitamins-Minerals (MULTIVITAMIN WITH MINERALS) tablet Take 1 tablet by mouth daily. Reported on 01/19/2016    [provider]  ondansetron (ZOFRAN) 4 MG tablet Take 1 tablet (4 mg total) by mouth every 8 (eight) hours as needed for nausea or vomiting. 12/02/17   Zigmund Gottron, NP  SUMAtriptan (IMITREX) 20 MG/ACT nasal spray Spray once into L nares for cluster headache. May repeat in 2 hours if headache persists or recurs. Max 2 doses in 7 day period. 04/10/17   Funches, Adriana Mccallum, MD  Vitamin D, Ergocalciferol, (DRISDOL) 50000 units CAPS capsule Take 1 capsule (50,000 Units total) by mouth every 7 (seven) days. 12/08/17   Argentina Donovan, PA-C    Family History Family History  Problem Relation Age of Onset  . Diabetes Father   . Cancer Sister        uterine  . Diabetes Paternal Grandmother   . Diabetes Paternal Uncle   . Diabetes Paternal Uncle   . Diabetes Paternal Uncle   . Diabetes Paternal Uncle   . Diabetes Paternal Uncle   . Diabetes Paternal Uncle     Social History Social History   Tobacco Use  . Smoking status: Never Smoker  . Smokeless tobacco: Never Used  Substance Use Topics  . Alcohol use: No  . Drug use:  No     Allergies   Bactrim [sulfamethoxazole-trimethoprim]   Review of Systems Review of Systems   Physical Exam Triage Vital Signs ED Triage Vitals [03/25/18 1212]  Enc Vitals Group     BP 121/71     Pulse Rate 72     Resp 16     Temp 97.7 F (36.5 C)     Temp Source Oral     SpO2 100 %     Weight      Height      Head Circumference      Peak Flow      Pain Score      Pain Loc  Pain Edu?      Excl. in [place]?    No data found.  Updated Vital Signs BP 121/71 (BP Location: Right Arm)   Pulse 72   Temp 97.7 F (36.5 C) (Oral)   Resp 16   SpO2 100%    Physical Exam  Constitutional: She is oriented to person, place, and time. She appears well-developed and well-nourished. No distress.  HENT:  Head: Normocephalic and atraumatic.  Right Ear: Tympanic membrane, external ear and ear canal normal.  Left Ear: Tympanic membrane, external ear and ear canal normal.  Nose: Nose normal.  Mouth/Throat: Uvula is midline, oropharynx is clear and moist and mucous membranes are normal. No tonsillar exudate.  Eyes: Pupils are equal, round, and reactive to light. Conjunctivae and EOM are normal. Right eye exhibits no discharge. Left eye exhibits no discharge.  Neck: Normal range of motion.  Cardiovascular: Normal rate, regular rhythm and normal heart sounds.  Pulmonary/Chest: Effort normal and breath sounds normal.  Abdominal: Soft. There is no tenderness.  Neurological: She is alert and oriented to person, place, and time. She displays normal reflexes. No cranial nerve deficit. Coordination normal.  Skin: Skin is warm and dry.     UC Treatments / Results  Labs (all labs ordered are listed, but only abnormal results are displayed) Labs Reviewed  POCT URINALYSIS DIP (DEVICE) - Abnormal; Notable for the following components:      Result Value   Hgb urine dipstick TRACE (*)    All other components within normal limits  POCT I-STAT, CHEM 8 - Abnormal; Notable for the  following components:   Calcium, Ion 1.11 (*)    Hemoglobin 15.6 (*)    All other components within normal limits  POCT PREGNANCY, URINE    EKG None  Radiology No results found.  Procedures Procedures (including critical care time)  Medications Ordered in UC Medications - No data to display  Initial Impression / Assessment and Plan / UC Course  I have reviewed the triage vital signs and the nursing notes.  Pertinent labs & imaging results that were available during my care of the patient were reviewed by me and considered in my medical decision making (see chart for details).     Previous labs and ekg reviewed. This is not a new problem for patient. Without acute distress. Afebrile. Without tachycardia, hypoxia, tachypnea, increased work of breathing. Chem 8, urine and pregnancy without acute findings. No red flag findings at this time. Inhaler provided for use as needed, will try another allergy medication as well. Encouraged close follow up with PCP for continued management. Discussed anxiety/depression as other potential cause of symptoms. Patient verbalized understanding and agreeable to plan.  Ambulatory out of clinic without difficulty.      Final Clinical Impressions(s) / UC Diagnoses   Final diagnoses:  Fatigue, unspecified type  Seasonal allergies     Discharge Instructions     Push fluids to ensure adequate hydration and keep secretions thin.  Tylenol and/or ibuprofen as needed for pain or fevers.   Use of albuterol inhaler as needed for wheezing or shortness of breath. Will try use of xyzal allergy medication, take a night, for allergy symptoms. May stop the loratidine and cetrizine previously prescribed.  Please continue to follow with your primary care provider for persistent symptoms. If develop chest pain, increased dizziness, shortness of breath or otherwise worsening please return to be seen or go to Er.     ED Prescriptions    Medication Sig  Dispense Auth. Provider   levocetirizine (XYZAL) 5 MG tablet Take 1 tablet (5 mg total) by mouth every evening. 30 tablet Augusto Gamble B, NP   albuterol (PROVENTIL HFA;VENTOLIN HFA) 108 (90 Base) MCG/ACT inhaler Inhale 1-2 puffs into the lungs every 6 (six) hours as needed for wheezing or shortness of breath. 1 Inhaler Zigmund Gottron, NP     Controlled Substance Prescriptions [place] Controlled Substance Registry consulted? Not Applicable   Zigmund Gottron, NP 03/25/18 1340

## 2018-03-25 NOTE — Discharge Instructions (Signed)
Push fluids to ensure adequate hydration and keep secretions thin.  Tylenol and/or ibuprofen as needed for pain or fevers.   Use of albuterol inhaler as needed for wheezing or shortness of breath. Will try use of xyzal allergy medication, take a night, for allergy symptoms. May stop the loratidine and cetrizine previously prescribed.  Please continue to follow with your primary care provider for persistent symptoms. If develop chest pain, increased dizziness, shortness of breath or otherwise worsening please return to be seen or go to Er.

## 2018-03-25 NOTE — ED Triage Notes (Signed)
Pt here for fatigue x 4 days and after running yesterday

## 2018-04-30 ENCOUNTER — Ambulatory Visit (INDEPENDENT_AMBULATORY_CARE_PROVIDER_SITE_OTHER): Payer: Self-pay | Admitting: Otolaryngology

## 2018-04-30 DIAGNOSIS — R070 Pain in throat: Secondary | ICD-10-CM

## 2018-04-30 DIAGNOSIS — J310 Chronic rhinitis: Secondary | ICD-10-CM

## 2018-05-11 ENCOUNTER — Telehealth: Payer: Self-pay | Admitting: Family Medicine

## 2018-05-11 NOTE — Telephone Encounter (Signed)
3 page paperwork received through fax 6.24.19

## 2018-06-05 ENCOUNTER — Ambulatory Visit: Payer: Self-pay | Attending: Internal Medicine

## 2018-07-02 ENCOUNTER — Ambulatory Visit (INDEPENDENT_AMBULATORY_CARE_PROVIDER_SITE_OTHER): Payer: Self-pay | Admitting: Otolaryngology

## 2018-07-30 ENCOUNTER — Ambulatory Visit (INDEPENDENT_AMBULATORY_CARE_PROVIDER_SITE_OTHER): Payer: Self-pay | Admitting: Otolaryngology

## 2018-07-31 ENCOUNTER — Encounter: Payer: Self-pay | Admitting: Family Medicine

## 2018-07-31 ENCOUNTER — Other Ambulatory Visit: Payer: Self-pay

## 2018-07-31 ENCOUNTER — Ambulatory Visit: Payer: Self-pay | Attending: Family Medicine | Admitting: Family Medicine

## 2018-07-31 ENCOUNTER — Telehealth: Payer: Self-pay

## 2018-07-31 VITALS — BP 104/71 | HR 69 | Temp 97.7°F | Resp 18 | Ht 62.0 in | Wt 143.0 lb

## 2018-07-31 DIAGNOSIS — N644 Mastodynia: Secondary | ICD-10-CM

## 2018-07-31 DIAGNOSIS — R5383 Other fatigue: Secondary | ICD-10-CM

## 2018-07-31 DIAGNOSIS — G43009 Migraine without aura, not intractable, without status migrainosus: Secondary | ICD-10-CM

## 2018-07-31 DIAGNOSIS — R319 Hematuria, unspecified: Secondary | ICD-10-CM

## 2018-07-31 DIAGNOSIS — Z79899 Other long term (current) drug therapy: Secondary | ICD-10-CM

## 2018-07-31 DIAGNOSIS — E559 Vitamin D deficiency, unspecified: Secondary | ICD-10-CM

## 2018-07-31 DIAGNOSIS — Z8541 Personal history of malignant neoplasm of cervix uteri: Secondary | ICD-10-CM | POA: Insufficient documentation

## 2018-07-31 DIAGNOSIS — R300 Dysuria: Secondary | ICD-10-CM

## 2018-07-31 DIAGNOSIS — Z9049 Acquired absence of other specified parts of digestive tract: Secondary | ICD-10-CM | POA: Insufficient documentation

## 2018-07-31 DIAGNOSIS — J309 Allergic rhinitis, unspecified: Secondary | ICD-10-CM

## 2018-07-31 LAB — POCT URINALYSIS DIP (CLINITEK)
Bilirubin, UA: NEGATIVE
Glucose, UA: NEGATIVE mg/dL
Ketones, POC UA: NEGATIVE mg/dL
Leukocytes, UA: NEGATIVE
Nitrite, UA: NEGATIVE
POC,PROTEIN,UA: NEGATIVE
Spec Grav, UA: 1.02
Urobilinogen, UA: 0.2 U/dL
pH, UA: 5

## 2018-07-31 MED ORDER — CIPROFLOXACIN HCL 500 MG PO TABS: 500.0000 mg | ORAL_TABLET | Freq: Two times a day (BID) | ORAL | 0 refills | Status: AC

## 2018-07-31 MED ORDER — CIPROFLOXACIN HCL 500 MG PO TABS: 500.0000 mg | ORAL_TABLET | Freq: Two times a day (BID) | ORAL | 0 refills | Status: DC

## 2018-07-31 MED FILL — CIPROFLOXACIN HCL 500 MG TA: 500 | 3 days supply | Qty: 6 | Fill #0
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
## 2018-07-31 NOTE — Addendum Note (Signed)
Addended by: Kathrene Bongo on: 07/31/2018 04:25 PM   Modules accepted: Orders

## 2018-07-31 NOTE — Progress Notes (Signed)
Flu shot no Pain care  Problems urinating last week, burn sensation when pee.   Physical wanted  [facility] pharmacy

## 2018-07-31 NOTE — Telephone Encounter (Signed)
-----   Message from Antony Blackbird, MD sent at 07/31/2018 10:45 AM EDT ----- Please notify patient that her UA was abnormal showing presence of blood and urine will be sent for culture. Since she is having symptoms, a RX has been sent to the pharmacy here for her to take Cipro 500 mg twice daily for 3 days until the culture results are known

## 2018-07-31 NOTE — Telephone Encounter (Signed)
Patient was called, no answer, lvm to return call. If patient returns call please Please notify patient that her UA was abnormal showing presence of blood and urine will be sent for culture. Since she is having symptoms, a RX has been sent to the pharmacy here for her to take Cipro 500 mg twice daily for 3 days until the culture results are known.

## 2018-08-01 LAB — CBC WITH DIFFERENTIAL/PLATELET
Basophils Absolute: 0.1 x10E3/uL (ref 0.0–0.2)
Basos: 1 %
EOS (ABSOLUTE): 0.3 x10E3/uL (ref 0.0–0.4)
Eos: 4 %
Hematocrit: 43.3 % (ref 34.0–46.6)
Hemoglobin: 14.4 g/dL (ref 11.1–15.9)
Immature Grans (Abs): 0 x10E3/uL (ref 0.0–0.1)
Immature Granulocytes: 0 %
Lymphocytes Absolute: 2.1 x10E3/uL (ref 0.7–3.1)
Lymphs: 29 %
MCH: 28.2 pg (ref 26.6–33.0)
MCHC: 33.3 g/dL (ref 31.5–35.7)
MCV: 85 fL (ref 79–97)
Monocytes Absolute: 0.4 x10E3/uL (ref 0.1–0.9)
Monocytes: 5 %
Neutrophils Absolute: 4.3 x10E3/uL (ref 1.4–7.0)
Neutrophils: 61 %
Platelets: 364 x10E3/uL (ref 150–450)
RBC: 5.11 x10E6/uL (ref 3.77–5.28)
RDW: 12.4 % (ref 12.3–15.4)
WBC: 7.2 x10E3/uL (ref 3.4–10.8)

## 2018-08-01 LAB — COMPREHENSIVE METABOLIC PANEL WITH GFR
ALT: 13 IU/L (ref 0–32)
AST: 15 IU/L (ref 0–40)
Albumin/Globulin Ratio: 1.7 (ref 1.2–2.2)
Albumin: 4.7 g/dL (ref 3.5–5.5)
Alkaline Phosphatase: 54 IU/L (ref 39–117)
BUN/Creatinine Ratio: 18 (ref 9–23)
BUN: 13 mg/dL (ref 6–24)
Bilirubin Total: 0.4 mg/dL (ref 0.0–1.2)
CO2: 21 mmol/L (ref 20–29)
Calcium: 9.2 mg/dL (ref 8.7–10.2)
Chloride: 103 mmol/L (ref 96–106)
Creatinine, Ser: 0.71 mg/dL (ref 0.57–1.00)
GFR calc Af Amer: 123 mL/min/1.73
GFR calc non Af Amer: 107 mL/min/1.73
Globulin, Total: 2.7 g/dL (ref 1.5–4.5)
Glucose: 97 mg/dL (ref 65–99)
Potassium: 4.6 mmol/L (ref 3.5–5.2)
Sodium: 138 mmol/L (ref 134–144)
Total Protein: 7.4 g/dL (ref 6.0–8.5)

## 2018-08-01 LAB — LIPID PANEL
Chol/HDL Ratio: 4.5 ratio — ABNORMAL HIGH (ref 0.0–4.4)
Cholesterol, Total: 227 mg/dL — ABNORMAL HIGH (ref 100–199)
HDL: 50 mg/dL
LDL Calculated: 158 mg/dL — ABNORMAL HIGH (ref 0–99)
Triglycerides: 95 mg/dL (ref 0–149)
VLDL Cholesterol Cal: 19 mg/dL (ref 5–40)

## 2018-08-01 LAB — VITAMIN D 25 HYDROXY (VIT D DEFICIENCY, FRACTURES): Vit D, 25-Hydroxy: 29.5 ng/mL — ABNORMAL LOW (ref 30.0–100.0)

## 2018-08-01 LAB — TSH: TSH: 3.41 u[IU]/mL (ref 0.450–4.500)

## 2018-08-01 LAB — HEMOGLOBIN A1C
Est. average glucose Bld gHb Est-mCnc: 111 mg/dL
Hgb A1c MFr Bld: 5.5 % (ref 4.8–5.6)

## 2018-08-02 LAB — URINE CULTURE

## 2018-08-05 NOTE — Telephone Encounter (Signed)
Patient was called, given pcp note. Patient stated she is no longer having symptoms. Patient stated she already has a fu appointment made, will fu then.

## 2018-08-05 NOTE — Telephone Encounter (Signed)
-----   Message from Antony Blackbird, MD sent at 08/02/2018  6:26 PM EDT ----- Notify patient regarding culture showed mixed urogenital flora which is bacteria that is normally found in the urogenital area.  Patient was prescribed 3 days of antibiotics however if she is still having symptoms, please have patient schedule follow-up.  She does not need any additional antibiotics at this time.

## 2018-08-09 ENCOUNTER — Ambulatory Visit (HOSPITAL_COMMUNITY)
Admission: EM | Admit: 2018-08-09 | Discharge: 2018-08-09 | Disposition: A | Discharge status: Home / Self Care | Payer: Self-pay | Attending: Internal Medicine | Admitting: Internal Medicine

## 2018-08-09 ENCOUNTER — Encounter (HOSPITAL_COMMUNITY): Payer: Self-pay | Admitting: Emergency Medicine

## 2018-08-09 ENCOUNTER — Other Ambulatory Visit: Payer: Self-pay

## 2018-08-09 DIAGNOSIS — R110 Nausea: Secondary | ICD-10-CM

## 2018-08-09 DIAGNOSIS — Z3202 Encounter for pregnancy test, result negative: Secondary | ICD-10-CM

## 2018-08-09 DIAGNOSIS — R319 Hematuria, unspecified: Secondary | ICD-10-CM

## 2018-08-09 DIAGNOSIS — M545 Low back pain, unspecified: Secondary | ICD-10-CM

## 2018-08-09 DIAGNOSIS — Z87442 Personal history of urinary calculi: Secondary | ICD-10-CM

## 2018-08-09 LAB — POCT URINALYSIS DIP (DEVICE)
Bilirubin Urine: NEGATIVE
Glucose, UA: NEGATIVE mg/dL
Leukocytes, UA: NEGATIVE
Nitrite: NEGATIVE
Protein, ur: NEGATIVE mg/dL
Specific Gravity, Urine: >=1.03 (ref 1.005–1.030)
Urobilinogen, UA: 0.2 mg/dL (ref 0.0–1.0)
pH: 5.5 (ref 5.0–8.0)

## 2018-08-09 LAB — POCT PREGNANCY, URINE
Preg Test, Ur: NEGATIVE
Preg Test, Ur: NEGATIVE

## 2018-08-09 MED ORDER — TAMSULOSIN HCL 0.4 MG PO CAPS: 0.4000 mg | ORAL_CAPSULE | Freq: Every day | ORAL | 0 refills | Status: DC

## 2018-08-09 NOTE — ED Triage Notes (Addendum)
Interpreter [phone]  Complains of back pain.   Low back pain.  Patient saw her pcp 2 weeks ago.  Reports infection in urine.  Patient received antibiotic.  Patient does not know name of antibiotic and has finished -"it was only for 3 days" Back pain is worse

## 2018-08-09 NOTE — Discharge Instructions (Addendum)
Tome 500mg  de Tylenol cada 6 horas con comida para dolor y inflammacion. Puede tomar benadryl 25-50mg  cada 6 horas si tiene Marketing executive.

## 2018-08-09 NOTE — ED Provider Notes (Signed)
MRN: [id number] DOB: Jul 22, 1976  Subjective:   Alicia Miller is a 42 y.o. female presenting for 2 week history of low back pain, R>L. Pain is constant, worse at night while lying down, pressure type sensation. Has also had nausea. Denies fever, vomiting, hematuria, dysuria, belly pain, pelvic pain. Was given an antibiotic for an UTI on 07/31/2018, ciprofloxacin x3 days. Urine culture was equivocal. CMET and CBC were normal from same visit. Patient does not hydrate well with water. Has a history of renal stones, had them removed ~2 years ago. Has a history of cervical cancer and is in remission.   No current facility-administered medications for this encounter.   Current Outpatient Medications:  Marland Kitchen  Vitamin D, Ergocalciferol, (DRISDOL) 50000 units CAPS capsule, Take 1 capsule (50,000 Units total) by mouth every 7 (seven) days., Disp: 16 capsule, Rfl: 0 .  Multiple Vitamins-Minerals (MULTIVITAMIN WITH MINERALS) tablet, Take 1 tablet by mouth daily. Reported on 01/19/2016, Disp: , Rfl:  .  vitamin B-12 (CYANOCOBALAMIN) 100 MCG tablet, Take 100 mcg by mouth daily., Disp: , Rfl:    Allergies  Allergen Reactions  . Bactrim [Sulfamethoxazole-Trimethoprim] Itching    Past Medical History:  Diagnosis Date  . 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 Cancer (HCC)    Uterine  . Depression   . Left-sided Bell's palsy 01/19/2016  . No pertinent past medical history   . Varicose veins of left leg with edema      Past Surgical History:  Procedure Laterality Date  . CHOLECYSTECTOMY  05/2015  . ENDOVENOUS ABLATION SAPHENOUS VEIN W/ LASER Left 12/08/2017   endovenous laser ablation left greater saphenous vein and stab phlebectomy 10-20 incisions left leg by Tinnie Gens MD     Objective:   Vitals: BP 113/78 (BP Location: Right Arm)   Pulse 71   Temp 98.1 F (36.7 C) (Oral)   Resp 18   LMP  08/04/2018   SpO2 100%   Physical Exam  Constitutional: She is oriented to person, place, and time. She appears well-developed and well-nourished.  HENT:  Mouth/Throat: Oropharynx is clear and moist.  Cardiovascular: Normal rate, regular rhythm, normal heart sounds and intact distal pulses. Exam reveals no gallop and no friction rub.  No murmur heard. Pulmonary/Chest: Effort normal and breath sounds normal. No stridor. No respiratory distress. She has no wheezes. She has no rales.  Abdominal: Soft. Bowel sounds are normal. She exhibits no distension and no mass. There is no tenderness (mild over mid-left abdomen). There is no rebound and no guarding.  No CVA tenderness.  Musculoskeletal: She exhibits no edema.  Neurological: She is alert and oriented to person, place, and time.  Skin: Skin is warm and dry. No rash noted. No erythema. No pallor.    Results for orders placed or performed during the hospital encounter of 08/09/18 (from the past 24 hour(s))  POCT urinalysis dip (device)     Status: Abnormal   Collection Time: 08/09/18 12:21 PM  Result Value Ref Range   Glucose, UA NEGATIVE NEGATIVE mg/dL   Bilirubin Urine NEGATIVE NEGATIVE   Ketones, ur TRACE (A) NEGATIVE mg/dL   Specific Gravity, Urine >=1.030 1.005 - 1.030   Hgb urine dipstick MODERATE (A) NEGATIVE   pH 5.5 5.0 - 8.0   Protein, ur NEGATIVE NEGATIVE mg/dL   Urobilinogen, UA 0.2 0.0 - 1.0 mg/dL   Nitrite NEGATIVE NEGATIVE  Leukocytes, UA NEGATIVE NEGATIVE  Pregnancy, urine POC     Status: None   Collection Time: 08/09/18 12:24 PM  Result Value Ref Range   Preg Test, Ur NEGATIVE NEGATIVE  Pregnancy, urine POC     Status: None   Collection Time: 08/09/18 12:31 PM  Result Value Ref Range   Preg Test, Ur NEGATIVE NEGATIVE    Assessment and Plan :   Acute bilateral low back pain without sciatica  History of kidney stones  Hematuria, unspecified type  Counseled patient that she is likely experiencing recurrent  renal stone.  Encouraged her to hydrate aggressively and adequately each day.  We will use Flomax to help with her possible renal stones as well.  Regarding her allergies, patient admits that she never had any real allergies allergy with Bactrim just some nausea and upset stomach but her PCP wanted to put her on the allergy list as a precaution.  She is to follow-up with her PCP for possible consult on renal stones.  ER return to clinic precautions reviewed.   Jaynee Eagles, PA-C 08/09/18 1442

## 2018-08-26 ENCOUNTER — Ambulatory Visit: Payer: Self-pay | Attending: Family Medicine

## 2018-08-31 ENCOUNTER — Ambulatory Visit: Payer: Self-pay | Attending: Family Medicine | Admitting: Family Medicine

## 2018-08-31 ENCOUNTER — Encounter: Payer: Self-pay | Admitting: Family Medicine

## 2018-08-31 ENCOUNTER — Ambulatory Visit (INDEPENDENT_AMBULATORY_CARE_PROVIDER_SITE_OTHER): Payer: Self-pay | Admitting: Otolaryngology

## 2018-08-31 ENCOUNTER — Ambulatory Visit (HOSPITAL_COMMUNITY)
Admission: RE | Admit: 2018-08-31 | Discharge: 2018-08-31 | Disposition: A | Discharge status: Home / Self Care | Payer: Self-pay | Source: Ambulatory Visit | Attending: Family Medicine | Admitting: Family Medicine

## 2018-08-31 VITALS — BP 108/71 | HR 70 | Temp 98.8°F | Resp 18 | Ht 62.0 in | Wt 143.0 lb

## 2018-08-31 DIAGNOSIS — R3129 Other microscopic hematuria: Secondary | ICD-10-CM

## 2018-08-31 DIAGNOSIS — J310 Chronic rhinitis: Secondary | ICD-10-CM

## 2018-08-31 DIAGNOSIS — K219 Gastro-esophageal reflux disease without esophagitis: Secondary | ICD-10-CM

## 2018-08-31 DIAGNOSIS — M545 Low back pain, unspecified: Secondary | ICD-10-CM

## 2018-08-31 DIAGNOSIS — Z882 Allergy status to sulfonamides status: Secondary | ICD-10-CM | POA: Insufficient documentation

## 2018-08-31 DIAGNOSIS — Z9049 Acquired absence of other specified parts of digestive tract: Secondary | ICD-10-CM | POA: Insufficient documentation

## 2018-08-31 DIAGNOSIS — F329 Major depressive disorder, single episode, unspecified: Secondary | ICD-10-CM | POA: Insufficient documentation

## 2018-08-31 DIAGNOSIS — R1013 Epigastric pain: Secondary | ICD-10-CM

## 2018-08-31 DIAGNOSIS — J342 Deviated nasal septum: Secondary | ICD-10-CM

## 2018-08-31 DIAGNOSIS — Z87442 Personal history of urinary calculi: Secondary | ICD-10-CM | POA: Insufficient documentation

## 2018-08-31 DIAGNOSIS — R070 Pain in throat: Secondary | ICD-10-CM

## 2018-08-31 LAB — POCT URINALYSIS DIP (CLINITEK)
Bilirubin, UA: NEGATIVE
Glucose, UA: NEGATIVE mg/dL
Ketones, POC UA: NEGATIVE mg/dL
Leukocytes, UA: NEGATIVE
Nitrite, UA: NEGATIVE
POC,PROTEIN,UA: NEGATIVE
Spec Grav, UA: 1.025
Urobilinogen, UA: 0.2 U/dL
pH, UA: 6

## 2018-08-31 MED ORDER — IBUPROFEN 600 MG PO TABS: 600.0000 mg | ORAL_TABLET | Freq: Three times a day (TID) | ORAL | 0 refills | Status: DC | PRN

## 2018-08-31 MED ORDER — OMEPRAZOLE 20 MG PO CPDR: DELAYED_RELEASE_CAPSULE | ORAL | 2 refills | Status: DC

## 2018-08-31 MED ORDER — CYCLOBENZAPRINE HCL 10 MG PO TABS: 10.0000 mg | ORAL_TABLET | Freq: Every day | ORAL | 0 refills | Status: DC

## 2018-08-31 MED FILL — IBUPROFEN 600 MG TABLET: 600 | 10 days supply | Qty: 30 | Fill #0

## 2018-08-31 MED FILL — OMEPRAZOLE 20 MG CAP: 20 | 30 days supply | Qty: 60 | Fill #0

## 2018-08-31 MED FILL — CYCLOBENZAPRINE 10 MG TAB: 10 | 30 days supply | Qty: 30 | Fill #0

## 2018-08-31 NOTE — Progress Notes (Signed)
Subjective:    Patient ID: Alicia Miller, female    DOB: 05/26/1976, 42 y.o.   MRN: [id number]   Due to a language barrier, video interpreter service used at today's visit  HPI 42 year old female who presents secondary to complaint of continued low back pain.  Patient states that she took the antibiotic prescribed at her last visit but did not feel any better regarding her back pain.  Patient states that she therefore went to a local urgent care (08/09/18)And patient states that they told her that she likely had a recurrence of her kidney stones.  Patient was given a medication, Flomax, and patient states that she took the medication for 3 days but then stopped it because it made her feel worse.  Patient states that she would like to have a referral to urologist.  Patient denies seeing any actual blood in the urine and no pink tint to the urine.  Patient denies any urinary frequency or dysuria.  Patient states that she has had some mild nausea.  No fever or chills.  Patient admits that she does get some upper abdominal discomfort with spicy foods.  Patient with complaint of continued back pain which was initially on the right and is now spreading to the left.  Patient states that her right-sided back pain is about a 8 on a 0-to-10 scale especially at night when she is trying to lie down.  Patient states that that is when her pain is worse.  Patient sometimes feels as if the pain goes into her right buttock and sometimes spreads upward.  Patient denies any history of back injury.  Patient states that she does not work outside the home currently but did work in [employer] and did a lot of work walking.  Patient states that the pain in her right side is like a severe pressure.  Patient reports family history of mother with diabetes and a sister with cervical cancer.  Patient states that her only allergy she is Bactrim DS and patient states that she had dizziness and dry mouth with this medication.   Patient reports no tobacco use.  Past Medical History:  Diagnosis Date  . 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 Cancer (HCC)    Uterine  . Depression   . Left-sided Bell's palsy 01/19/2016  . No pertinent past medical history   . Varicose veins of left leg with edema    Past Surgical History:  Procedure Laterality Date  . CHOLECYSTECTOMY  05/2015  . ENDOVENOUS ABLATION SAPHENOUS VEIN W/ LASER Left 12/08/2017   endovenous laser ablation left greater saphenous vein and stab phlebectomy 10-20 incisions left leg by Tinnie Gens MD    Family History  Problem Relation Age of Onset  . Diabetes Father   . Cancer Sister        uterine  . Diabetes Paternal Grandmother   . Diabetes Paternal Uncle   . Diabetes Paternal Uncle   . Diabetes Paternal Uncle   . Diabetes Paternal Uncle   . Diabetes Paternal Uncle   . Diabetes Paternal Uncle    Social History   Tobacco Use  . Smoking status: Never Smoker  . Smokeless tobacco: Never Used  Substance Use Topics  . Alcohol use: No  . Drug use: No   Allergies  Allergen Reactions  . Bactrim [Sulfamethoxazole-Trimethoprim] Itching    Review of Systems  Constitutional: Positive for fatigue. Negative for chills, diaphoresis and fever.  Respiratory: Negative for cough and shortness of breath.   Cardiovascular: Negative for chest pain, palpitations and leg swelling.  Gastrointestinal: Positive for nausea. Negative for abdominal distention and abdominal pain.  Genitourinary: Positive for flank pain. Negative for dysuria, frequency, hematuria, pelvic pain and urgency.  Musculoskeletal: Positive for back pain. Negative for arthralgias, gait problem, joint swelling and myalgias.  Neurological: Negative for dizziness, light-headedness and headaches.       Objective:   Physical Exam BP 108/71 (BP Location: Left Arm, Patient Position: Sitting, Cuff Size:  Normal)   Pulse 70   Temp 98.8 F (37.1 C) (Oral)   Resp 18   Ht 5\' 2"  (1.575 m)   Wt 143 lb (64.9 kg)   LMP 08/04/2018   SpO2 98%   BMI 26.16 kg/m  Vital signs and nurse's notes reviewed General-well-nourished, well-developed female in no acute distress Lungs-clear to auscultation bilaterally Cardiovascular-regular rate and rhythm Abdomen- patient with epigastric and suprapubic discomfort to palpation, no rebound or guarding Back-no CVA tenderness.  Patient however has tenderness to palpation of the right side of the back just below the CVA area and has some right-sided thoracolumbar paraspinous spasm.  Patient with back discomfort and complaint of some radiation of discomfort into the right buttock with seated right leg raise Extremities-no edema      Assessment & Plan:  1. Acute bilateral low back pain without sciatica Patient with complaint of bilateral back pain right greater than left.  On examination, patient does not have CVA tenderness but does have pain with palpation over the right mid back as well as positive seated leg raise.  I discussed with the patient through the interpreter that I believe that she may have musculoskeletal back pain and/or degenerative disc disease with radiation as a cause of her pain.  Patient also with history of kidney stone therefore urinalysis was done which did show hematuria but no other signs of infection.  Urine will be sent for culture.  Patient is also to obtain a KUB to see if a stone will show up.  Patient has had a CT scan done in 2017 which showed a probable nonobstructing 2 mm interpolar right renal stone.Is patient is prescribed ibuprofen to take as needed for pain as well as cyclobenzaprine.  Patient states that she was told at the Urgent care that ibuprofen was not good for her kidneys and that she should take Tylenol. (Patient's last Cr was normal so I expressed to patient that she can take either medication per her preference)' - POCT  URINALYSIS DIP (CLINITEK) - DG Abd 1 View; Future - ibuprofen (ADVIL,MOTRIN) 600 MG tablet; Take 1 tablet (600 mg total) by mouth every 8 (eight) hours as needed for moderate pain. Take after eating  Dispense: 30 tablet; Refill: 0 - cyclobenzaprine (FLEXERIL) 10 MG tablet; Take 1 tablet (10 mg total) by mouth at bedtime. As needed for back pain/muscle spasm  Dispense: 30 tablet; Refill: 0  2. Other microscopic hematuria Patient with continued hematuria on urinalysis and patient will be referred to urology for further evaluation and treatment.  Urine will also be sent for culture.  Patient's most recent urine culture showed mixed urogenital flora therefore this did not require further antibiotic therapy.  Patient will also obtain a KUB. - Ambulatory referral to Urology - Urine Culture - DG Abd 1 View; Future  3. Epigastric pain Patient with epigastric tenderness on examination.  Patient reports that  she has occasional reflux symptoms/burning/belching or upper abdominal pain with spicy/greasy foods.  Since patient was also prescribed ibuprofen, prescription given for omeprazole 20 mg twice daily to take for at least 30 days / 4 weeks.  An After Visit Summary was printed and given to the patient.  Return in about 2 weeks (around 09/14/2018) for back pain if not already seen by Urology.   Allergies as of 08/31/2018      Reactions   Bactrim [sulfamethoxazole-trimethoprim] Itching      Medication List        Accurate as of 08/31/18  9:28 AM. Always use your most recent med list.          cyclobenzaprine 10 MG tablet Commonly known as:  FLEXERIL Take 1 tablet (10 mg total) by mouth at bedtime. As needed for back pain/muscle spasm   ibuprofen 600 MG tablet Commonly known as:  ADVIL,MOTRIN Take 1 tablet (600 mg total) by mouth every 8 (eight) hours as needed for moderate pain. Take after eating   multivitamin with minerals tablet Take 1 tablet by mouth daily. Reported on 01/19/2016     omeprazole 20 MG capsule Commonly known as:  PRILOSEC Take one pill twice per day to reduce stomach acid   tamsulosin 0.4 MG Caps capsule Commonly known as:  FLOMAX Take 1 capsule (0.4 mg total) by mouth daily after breakfast.   vitamin B-12 100 MCG tablet Commonly known as:  CYANOCOBALAMIN Take 100 mcg by mouth daily.   Vitamin D (Ergocalciferol) 50000 units Caps capsule Commonly known as:  DRISDOL Take 1 capsule (50,000 Units total) by mouth every 7 (seven) days.     An After Visit Summary was printed and given to the patient.  Return in about 2 weeks (around 09/14/2018) for back pain if not already seen by Urology.

## 2018-09-02 ENCOUNTER — Telehealth: Payer: Self-pay | Admitting: *Deleted

## 2018-09-02 ENCOUNTER — Other Ambulatory Visit: Payer: Self-pay | Admitting: Family Medicine

## 2018-09-02 ENCOUNTER — Telehealth: Payer: Self-pay

## 2018-09-02 DIAGNOSIS — N3001 Acute cystitis with hematuria: Secondary | ICD-10-CM

## 2018-09-02 LAB — URINE CULTURE

## 2018-09-02 MED ORDER — CEPHALEXIN 500 MG PO CAPS: 500.0000 mg | ORAL_CAPSULE | Freq: Two times a day (BID) | ORAL | 0 refills | Status: DC

## 2018-09-02 NOTE — Progress Notes (Signed)
Patient ID: Alicia Miller, female   DOB: 1976-09-20, 42 y.o.   MRN: [id number]   Patient with urine culture with 10,000 units of quality forming bacteria however identification and sensitivity were not done due to the limited bacterial growth.  As patient is symptomatic, prescription will be sent to her pharmacy for Keflex 500 mg twice daily x7 days and patient will keep follow-up appointment with urology. Patient will be notified by CMA.

## 2018-09-02 NOTE — Telephone Encounter (Signed)
[employer] interpreter: Lesleigh Noe: [phone] Patient was called, answered, verified dob and was given most recent urine results. Patient verbalized understanding and had no further questions.

## 2018-09-02 NOTE — Telephone Encounter (Signed)
-----   Message from Antony Blackbird, MD sent at 08/31/2018 11:30 AM EDT ----- Please noitfy patient that the KUB x-ray did not show any acute abnormality; let patient know that she has been referred to Urology as well in follow-up of her recurrent hematuria on UA

## 2018-09-02 NOTE — Telephone Encounter (Signed)
Medical Assistant used [employer] to contact patient.  Interpreter Name: Berle Mull #: [id number] Patient is aware of xray showing no bony abnormalities. Patient is aware of referral being placed with [facility] for urology and may take a few months for an open slot. Patient is aware of antibiotic being placed with [facility] to address UTI. No further questions.

## 2018-09-02 NOTE — Telephone Encounter (Signed)
-----   Message from Antony Blackbird, MD sent at 09/02/2018 11:57 AM EDT ----- Please notify patient that her urine culture did show the growth of bacteria and she likely has a urinary tract infection.  Prescription has been sent to [facility] on [facility] for Keflex 500 mg twice daily x7 days.  Patient should keep follow-up with urology.  Referral was placed at her recent visit.

## 2018-09-09 ENCOUNTER — Ambulatory Visit: Payer: Self-pay | Attending: Family Medicine | Admitting: Physician Assistant

## 2018-09-09 VITALS — BP 107/72 | HR 76 | Temp 98.1°F | Resp 16 | Wt 142.8 lb

## 2018-09-09 DIAGNOSIS — Z79899 Other long term (current) drug therapy: Secondary | ICD-10-CM | POA: Insufficient documentation

## 2018-09-09 DIAGNOSIS — J029 Acute pharyngitis, unspecified: Secondary | ICD-10-CM | POA: Insufficient documentation

## 2018-09-09 DIAGNOSIS — Z882 Allergy status to sulfonamides status: Secondary | ICD-10-CM | POA: Insufficient documentation

## 2018-09-09 DIAGNOSIS — J069 Acute upper respiratory infection, unspecified: Secondary | ICD-10-CM

## 2018-09-09 DIAGNOSIS — Z8542 Personal history of malignant neoplasm of other parts of uterus: Secondary | ICD-10-CM | POA: Insufficient documentation

## 2018-09-09 MED ORDER — IBUPROFEN 600 MG PO TABS: 600.0000 mg | ORAL_TABLET | Freq: Three times a day (TID) | ORAL | 0 refills | Status: DC | PRN

## 2018-09-09 MED FILL — IBUPROFEN 600 MG TABLET: 600 | 10 days supply | Qty: 30 | Fill #0

## 2018-09-09 NOTE — Progress Notes (Signed)
Patient ID: Alicia Miller, female   DOB: 29-May-1976, 42 y.o.   MRN: [id number]      Alicia Miller, is a 42 y.o. female  TMA:[id number]  YBW:[id number]  DOB - 09/13/1976  Subjective:  Chief Complaint and HPI: Alicia Miller is a 42 y.o. female here today Sore throat X 3 days-took theraflu.  Mouth feels dry.  +nasal congestion and runny nose, slight cough.  No vomiting. No fever.  Both kids are sick with similar.   [employer] interpreters Myrna translating.   ROS:   Constitutional:  No f/c, No night sweats, No unexplained weight loss. EENT:  No vision changes, No blurry vision, No hearing changes.  Respiratory: +mild cough, No SOB Cardiac: No CP, no palpitations GI:  No abd pain, No N/V/D. GU: No Urinary s/sx Musculoskeletal: No joint pain Neuro: No headache, no dizziness, no motor weakness.  Skin: No rash Endocrine:  No polydipsia. No polyuria.  Psych: Denies SI/HI  No problems updated.  ALLERGIES: Allergies  Allergen Reactions  . Bactrim [Sulfamethoxazole-Trimethoprim] Itching    PAST MEDICAL HISTORY: Past Medical History:  Diagnosis Date  . 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 Cancer (HCC)    Uterine  . Depression   . Left-sided Bell's palsy 01/19/2016  . No pertinent past medical history   . Varicose veins of left leg with edema     MEDICATIONS AT HOME: Prior to Admission medications   Medication Sig Start Date End Date Taking? Authorizing Provider  cyclobenzaprine (FLEXERIL) 10 MG tablet Take 1 tablet (10 mg total) by mouth at bedtime. As needed for back pain/muscle spasm Patient not taking: Reported on 09/09/2018 08/31/18   Fulp, Ander Gaster, MD  ibuprofen (ADVIL,MOTRIN) 600 MG tablet Take 1 tablet (600 mg total) by mouth every 8 (eight) hours as needed for moderate pain. Take after eating 09/09/18   Freeman Caldron M, PA-C  Multiple Vitamins-Minerals  (MULTIVITAMIN WITH MINERALS) tablet Take 1 tablet by mouth daily. Reported on 01/19/2016    [provider]  omeprazole (PRILOSEC) 20 MG capsule Take one pill twice per day to reduce stomach acid 08/31/18   Fulp, Cammie, MD  tamsulosin (FLOMAX) 0.4 MG CAPS capsule Take 1 capsule (0.4 mg total) by mouth daily after breakfast. 08/09/18   Jaynee Eagles, PA-C  vitamin B-12 (CYANOCOBALAMIN) 100 MCG tablet Take 100 mcg by mouth daily.    [provider]  Vitamin D, Ergocalciferol, (DRISDOL) 50000 units CAPS capsule Take 1 capsule (50,000 Units total) by mouth every 7 (seven) days. 12/08/17   Argentina Donovan, PA-C     Objective:  EXAM:   Vitals:   09/09/18 1415  BP: 107/72  Pulse: 76  Resp: 16  Temp: 98.1 F (36.7 C)  TempSrc: Oral  SpO2: 96%  Weight: 142 lb 12.8 oz (64.8 kg)    General appearance : A&OX3. NAD. Non-toxic-appearing HEENT: Atraumatic and Normocephalic.  PERRLA. EOM intact.  TM full B. Mouth-MMM, post pharynx WNL w/ moderate erythema but no exudate, + PND. Nose with enlarged turbinates and clear rhinorrhea Neck: supple, no JVD. No cervical lymphadenopathy. No thyromegaly Chest/Lungs:  Breathing-non-labored, Good air entry bilaterally, breath sounds normal without rales, rhonchi, or wheezing  CVS: S1 S2 regular, no murmurs, gallops, rubs  Extremities: Bilateral Lower Ext shows no edema, both legs are warm to touch with = pulse throughout Neurology:  CN II-XII grossly intact, Non focal.  Psych:  TP linear. J/I WNL. Normal speech. Appropriate eye contact and affect.  Skin:  No Rash  Data Review Lab Results  Component Value Date   HGBA1C 5.5 07/31/2018   HGBA1C 5.4 10/22/2017   HGBA1C 5.3 01/19/2016     Assessment & Plan   1. Viral upper respiratory tract infection Fluids, rest, respiratory care.  theraflu ok.  Ample water intake  2. Sore throat Salt water gargles tid-qid - ibuprofen (ADVIL,MOTRIN) 600 MG tablet; Take 1 tablet (600 mg total) by  mouth every 8 (eight) hours as needed for moderate pain. Take after eating  Dispense: 30 tablet; Refill: 0     Patient have been counseled extensively about nutrition and exercise  Return if symptoms worsen or fail to improve.  The patient was given clear instructions to go to ER or return to medical center if symptoms don't improve, worsen or new problems develop. The patient verbalized understanding. The patient was told to call to get lab results if they haven't heard anything in the next week.     Freeman Caldron, PA-C [facility] and [place], [place]   09/09/2018, 2:42 PM

## 2018-09-09 NOTE — Patient Instructions (Signed)
Infección del tracto respiratorio superior, adultos  (Upper Respiratory Infection, Adult)  La mayoría de las infecciones del tracto respiratorio superior están causadas por un virus. Un infección del tracto respiratorio superior afecta la nariz, la garganta y las vías respiratorias superiores. El tipo más común de infección del tracto respiratorio superior es el resfrío común.  CUIDADOS EN EL HOGAR  · Tome los medicamentos solamente como se lo haya indicado el médico.  · A fin de aliviar el dolor de garganta, haga gárgaras con solución salina templada o consuma caramelos para la tos, como se lo haya indicado el médico.  · Use un humidificador de vapor cálido o inhale el vapor de la ducha para aumentar la humedad del aire. Esto facilitará la respiración.  · Beba suficiente líquido para mantener el pis (orina) claro o de color amarillo pálido.  · Tome sopas y caldos transparentes.  · Siga una dieta saludable.  · Descanse todo lo que sea necesario.  · Regrese al trabajo cuando la fiebre haya desaparecido o el médico le diga que puede hacerlo.  ? Es posible que deba quedarse en su casa durante un tiempo prolongado, para no transmitir la infección a los demás.  ? También puede usar un barbijo y lavarse las manos con frecuencia para evitar el contagio del virus.  · Si tiene asma, use el inhalador con mayor frecuencia.  · No consuma ningún producto que contenga tabaco, lo que incluye cigarrillos, tabaco de mascar o cigarrillos electrónicos. Si necesita ayuda para dejar de fumar, consulte al médico.  SOLICITE AYUDA SI:  · Siente que empeora o que no mejora.  · Los medicamentos no logran aliviar los síntomas.  · Tiene escalofríos.  · La dificultad para respirar es peor.  · Tiene mucosidad marrón o roja.  · Tiene una secreción amarilla o marrón de la nariz.  · Le duele la cara, especialmente al inclinarse hacia adelante.  · Tiene fiebre.  · Tiene los ganglios del cuello hinchados.  · Siente dolor al tragar.   · Tiene zonas blancas en la parte de atrás de la garganta.  SOLICITE AYUDA DE INMEDIATO SI:  · Los siguientes síntomas son muy intensos o constantes:  ? Dolor de cabeza.  ? Dolor de oídos.  ? Dolor en la frente, detrás de los ojos y por encima de los pómulos (dolor sinusal).  ? Dolor en el pecho.  · Tiene enfermedad pulmonar prolongada (crónica) y cualquiera de estos síntomas:  ? Sibilancias.  ? Tos prolongada.  ? Tos con sangre.  ? Cambio en la mucosidad habitual.  · Presenta rigidez en el cuello.  · Tiene cambios en:  ? La visión.  ? La audición.  ? El pensamiento.  ? El estado de ánimo.  ASEGÚRESE DE QUE:  · Comprende estas instrucciones.  · Controlará su afección.  · Recibirá ayuda de inmediato si no mejora o si empeora.  Esta información no tiene como fin reemplazar el consejo del médico. Asegúrese de hacerle al médico cualquier pregunta que tenga.  Document Released: 04/08/2011 Document Revised: 03/21/2015 Document Reviewed: 02/09/2014  Elsevier Interactive Patient Education © 2018 [employer]

## 2018-09-23 ENCOUNTER — Encounter: Payer: Self-pay | Admitting: Family Medicine

## 2018-09-23 ENCOUNTER — Ambulatory Visit: Payer: Self-pay | Attending: Family Medicine | Admitting: Family Medicine

## 2018-09-23 VITALS — BP 113/74 | HR 82 | Temp 99.0°F | Resp 16 | Wt 144.2 lb

## 2018-09-23 DIAGNOSIS — Z86001 Personal history of in-situ neoplasm of cervix uteri: Secondary | ICD-10-CM | POA: Insufficient documentation

## 2018-09-23 DIAGNOSIS — Z9049 Acquired absence of other specified parts of digestive tract: Secondary | ICD-10-CM | POA: Insufficient documentation

## 2018-09-23 DIAGNOSIS — Z8049 Family history of malignant neoplasm of other genital organs: Secondary | ICD-10-CM | POA: Insufficient documentation

## 2018-09-23 DIAGNOSIS — Z833 Family history of diabetes mellitus: Secondary | ICD-10-CM | POA: Insufficient documentation

## 2018-09-23 DIAGNOSIS — R319 Hematuria, unspecified: Secondary | ICD-10-CM

## 2018-09-23 DIAGNOSIS — Z882 Allergy status to sulfonamides status: Secondary | ICD-10-CM | POA: Insufficient documentation

## 2018-09-23 DIAGNOSIS — Z87442 Personal history of urinary calculi: Secondary | ICD-10-CM

## 2018-09-23 DIAGNOSIS — K219 Gastro-esophageal reflux disease without esophagitis: Secondary | ICD-10-CM

## 2018-09-23 DIAGNOSIS — M546 Pain in thoracic spine: Secondary | ICD-10-CM | POA: Insufficient documentation

## 2018-09-23 DIAGNOSIS — J0100 Acute maxillary sinusitis, unspecified: Secondary | ICD-10-CM

## 2018-09-23 DIAGNOSIS — M549 Dorsalgia, unspecified: Secondary | ICD-10-CM

## 2018-09-23 DIAGNOSIS — R1013 Epigastric pain: Secondary | ICD-10-CM

## 2018-09-23 LAB — POCT URINALYSIS DIP (CLINITEK)
Bilirubin, UA: NEGATIVE
Glucose, UA: NEGATIVE mg/dL
Ketones, POC UA: NEGATIVE mg/dL
Leukocytes, UA: NEGATIVE
Nitrite, UA: NEGATIVE
POC,PROTEIN,UA: NEGATIVE
Spec Grav, UA: 1.025
Urobilinogen, UA: 0.2 U/dL
pH, UA: 5.5

## 2018-09-23 MED ORDER — TIZANIDINE HCL 4 MG PO CAPS: 4.0000 mg | ORAL_CAPSULE | Freq: Every evening | ORAL | 1 refills | Status: DC | PRN

## 2018-09-23 MED ORDER — OMEPRAZOLE 20 MG PO CPDR: DELAYED_RELEASE_CAPSULE | ORAL | 5 refills | Status: DC

## 2018-09-23 MED ORDER — AMOXICILLIN-POT CLAVULANATE 500-125 MG PO TABS: 1.0000 | ORAL_TABLET | Freq: Two times a day (BID) | ORAL | 0 refills | Status: DC

## 2018-09-23 MED FILL — tiZANidine HCL 4 MG TABS: 4 | 30 days supply | Qty: 30 | Fill #0

## 2018-09-23 MED FILL — AMOX-CLAV 500-125 MG TABLET: 500-125 | 10 days supply | Qty: 20 | Fill #0

## 2018-09-23 MED FILL — OMEPRAZOLE 20 MG CAP: 20 | 30 days supply | Qty: 60 | Fill #0

## 2018-09-23 NOTE — Progress Notes (Signed)
Subjective:    Patient ID: Alicia Miller, female    DOB: Aug 02, 1976, 42 y.o.   MRN: [id number]  HPI       42 yo female who is s/p recent visit due to low back pain. Patient with a history of recurrent UTI and kidney stones. Patient however also has some low back pain with radiation. Patient did have some hematuria on UA and Urology referral was placed.  Patient states that she has not yet received notification regarding the urology follow-up.  Patient continues to have off-and-on pain in the right mid back.  Patient states that she did take a muscle relaxant, Flexeril prescribed at her last visit however this caused her to be drowsy the next day and she also felt as if the medication caused her to have depression.  Patient also still feels as if she has occasional radiation of pain from her lower back into her legs but this is improved after she took medication prescribed at her last visit.  Patient states that the pain in her back feels like a dull ache or pressure.  Sometimes she has no pain and sometimes the pain is about a 6-7.  Pain usually occurs at night when she is trying to sleep.      Patient also with complaint of nasal congestion, postnasal drainage, ear pressure and patient with complaint of recurrent headache after she recently tripped and almost fell while walking and hit the left side of her face/head against her friend's knee.  Patient did not have any loss of consciousness but the left side of her face hurt for a few days and she had a headache on the left side for a few days.  Patient states that she is currently having some facial pressure and a mild left-sided headache.  Patient does not believe that she has had fever or chills.  Patient has felt fatigued.  Patient with occasional dry cough secondary to postnasal drainage.      Patient reports that she has had some burping/belching and sensation of acid reflux.  Patient denies nausea and patient has had no blood in the stool.   Patient has had issues with acid reflux in the past.  Patient occasionally has some burning sensation in her upper abdomen.  Patient denies any current issues with urinary frequency or dysuria. Past Medical History:  Diagnosis Date  . 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 Cancer (HCC)    Uterine  . Depression   . Left-sided Bell's palsy 01/19/2016  . No pertinent past medical history   . Varicose veins of left leg with edema    Past Surgical History:  Procedure Laterality Date  . CHOLECYSTECTOMY  05/2015  . ENDOVENOUS ABLATION SAPHENOUS VEIN W/ LASER Left 12/08/2017   endovenous laser ablation left greater saphenous vein and stab phlebectomy 10-20 incisions left leg by Tinnie Gens MD    Family History  Problem Relation Age of Onset  . Diabetes Father   . Cancer Sister        uterine  . Diabetes Paternal Grandmother   . Diabetes Paternal Uncle   . Diabetes Paternal Uncle   . Diabetes Paternal Uncle   . Diabetes Paternal Uncle   . Diabetes Paternal Uncle   . Diabetes Paternal Uncle    Social History   Tobacco Use  . Smoking status: Never Smoker  . Smokeless tobacco:  Never Used  Substance Use Topics  . Alcohol use: No  . Drug use: No   Allergies  Allergen Reactions  . Bactrim [Sulfamethoxazole-Trimethoprim] Itching   Review of Systems  Constitutional: Positive for fatigue. Negative for chills and fever.  HENT: Positive for congestion, postnasal drip, rhinorrhea and sinus pressure. Negative for facial swelling, nosebleeds, sinus pain, sore throat and trouble swallowing.   Respiratory: Positive for cough (Nonproductive). Negative for shortness of breath.   Cardiovascular: Negative for chest pain, palpitations and leg swelling.  Gastrointestinal: Positive for abdominal pain (Occasional burning sensation in upper abdomen). Negative for blood in stool, constipation and diarrhea.    Genitourinary: Negative for dysuria and frequency.  Musculoskeletal: Positive for back pain and myalgias. Negative for arthralgias, gait problem and joint swelling.  Neurological: Positive for headaches. Negative for dizziness, facial asymmetry and light-headedness.       Objective:   Physical Exam BP 113/74   Pulse 82   Temp 99 F (37.2 C) (Oral)   Resp 16   Wt 144 lb 3.2 oz (65.4 kg)   SpO2 99%   BMI 26.37 kg/m Nursing notes and vital signs reviewed General-well-nourished, well-developed female in no acute distress ENT- TMs light pink bilaterally but with visible landmarks, nares with edema/erythema of the nasal turbinates with yellow-green nasal discharge patient with posterior pharynx erythema with cobblestoning.  Patient with tenderness over the maxillary sinuses right greater than left. Neck-supple, no thyromegaly, no LAD Lungs-clear to auscultation bilaterally Cardiovascular-regular rate and rhythm Back- patient with right mid back tenderness to palpation and mild thoracolumbar paraspinous spasm.  Patient denies actual CVA tenderness with percussion over the mid back Abdomen- patient with epigastric tenderness on exam, no rebound or guarding Extremities-no edema       Assessment & Plan:  1. Hematuria, unspecified type Patient with moderate hematuria on urinalysis.  Patient is awaiting urology referral as patient does have a history of kidney stones.  Patient's urine will be sent for culture and patient will be notified if further treatment is needed based on the culture results - POCT URINALYSIS DIP (CLINITEK) - Urine Culture  2. Mid back pain on right side Patient with continued right-sided mid back pain.  Patient is awaiting urology referral secondary to hematuria and a history of a nonobstructing kidney stone.  Patient reports that the last muscle relaxant she tried made her feel drowsy the next day as well as sensation of feeling depressed therefore patient given  prescription for new muscle relaxant, Zanaflex to take at bedtime as needed for back pain/muscle spasm. - Urine Culture - tiZANidine (ZANAFLEX) 4 MG capsule; Take 1 capsule (4 mg total) by mouth at bedtime as needed for muscle spasms.  Dispense: 30 capsule; Refill: 1  3. Subacute maxillary sinusitis Patient with evidence of subacute maxillary sinusitis on exam.  Prescription provided for Augmentin.  Patient should call or return if she continues to have headaches/discolored nasal congestion after use of antibiotics.  Patient may take an over-the-counter antihistamine to help with nasal congestion. - amoxicillin-clavulanate (AUGMENTIN) 500-125 MG tablet; Take 1 tablet (500 mg total) by mouth 2 (two) times daily. Take after eating  Dispense: 20 tablet; Refill: 0  4. Gastroesophageal reflux disease, esophagitis presence not specified Patient with epigastric tenderness on exam and patient with reflux type symptoms.  Patient prescribed omeprazole 20 mg twice daily and patient is encouraged to limit use of nonsteroidal anti-inflammatories as well as avoid known trigger foods and avoidance of spicy/greasy foods. - omeprazole (PRILOSEC) 20 MG  capsule; Take one pill twice per day to reduce stomach acid  Dispense: 60 capsule; Refill: 5  5. Epigastric pain Patient with epigastric discomfort on exam and patient is being placed on omeprazole.  If she has continued or worsening epigastric pain, patient was asked to return for further evaluation and she will likely need testing for H. pylori, lipase to look for elevated pancreatic enzyme/pancreatitis as well as CBC to look for blood loss. - omeprazole (PRILOSEC) 20 MG capsule; Take one pill twice per day to reduce stomach acid  Dispense: 60 capsule; Refill: 5  6. History of kidney stones Patient with a history of kidney stones and patient had urinalysis done at today's visit which did show moderate blood.  Urine will be sent for culture and patient is awaiting  urology referral  An After Visit Summary was printed and given to the patient.  Return in about 3 months (around 12/24/2018) for GERD/back pain.

## 2018-09-25 LAB — URINE CULTURE

## 2018-10-09 ENCOUNTER — Other Ambulatory Visit (HOSPITAL_COMMUNITY): Payer: Self-pay | Admitting: *Deleted

## 2018-10-09 DIAGNOSIS — Z1231 Encounter for screening mammogram for malignant neoplasm of breast: Secondary | ICD-10-CM

## 2018-11-09 ENCOUNTER — Ambulatory Visit (INDEPENDENT_AMBULATORY_CARE_PROVIDER_SITE_OTHER): Payer: Self-pay | Admitting: Otolaryngology

## 2018-12-03 ENCOUNTER — Ambulatory Visit (INDEPENDENT_AMBULATORY_CARE_PROVIDER_SITE_OTHER): Payer: Self-pay | Admitting: Otolaryngology

## 2018-12-03 DIAGNOSIS — R070 Pain in throat: Secondary | ICD-10-CM

## 2018-12-03 DIAGNOSIS — R1312 Dysphagia, oropharyngeal phase: Secondary | ICD-10-CM

## 2018-12-03 DIAGNOSIS — K219 Gastro-esophageal reflux disease without esophagitis: Secondary | ICD-10-CM

## 2018-12-03 DIAGNOSIS — J310 Chronic rhinitis: Secondary | ICD-10-CM

## 2018-12-23 ENCOUNTER — Ambulatory Visit: Payer: Self-pay

## 2018-12-24 ENCOUNTER — Ambulatory Visit: Payer: Self-pay

## 2018-12-24 ENCOUNTER — Ambulatory Visit: Payer: Self-pay | Attending: Family Medicine | Admitting: Family Medicine

## 2018-12-24 ENCOUNTER — Other Ambulatory Visit: Payer: Self-pay

## 2018-12-24 ENCOUNTER — Encounter: Payer: Self-pay | Admitting: Family Medicine

## 2018-12-24 ENCOUNTER — Ambulatory Visit (HOSPITAL_COMMUNITY): Payer: Self-pay

## 2018-12-24 VITALS — BP 119/71 | HR 85 | Temp 98.2°F | Resp 16 | Wt 144.2 lb

## 2018-12-24 DIAGNOSIS — J029 Acute pharyngitis, unspecified: Secondary | ICD-10-CM

## 2018-12-24 DIAGNOSIS — Z20828 Contact with and (suspected) exposure to other viral communicable diseases: Secondary | ICD-10-CM

## 2018-12-24 MED ORDER — AZITHROMYCIN 250 MG PO TABS: ORAL_TABLET | ORAL | 0 refills | Status: DC

## 2018-12-24 MED ORDER — OSELTAMIVIR PHOSPHATE 75 MG PO CAPS: 75.0000 mg | ORAL_CAPSULE | Freq: Two times a day (BID) | ORAL | 0 refills | Status: AC

## 2018-12-24 MED FILL — AZITHROMYCIN 250 MG TABLET: 250 | 5 days supply | Qty: 6 | Fill #0

## 2018-12-24 MED FILL — TAMIFLU 75 MG GELCAP: 75 | 5 days supply | Qty: 10 | Fill #0

## 2018-12-24 NOTE — Progress Notes (Signed)
Headache  Body ache

## 2018-12-24 NOTE — Patient Instructions (Signed)
Gripe en los adultos Influenza, Adult A la gripe tambin se la conoce como "influenza". Es una infeccin en los pulmones, la nariz y la garganta (vas respiratorias). La causa un virus. La gripe provoca sntomas que son similares a los de un resfro. Tambin causa fiebre alta y dolores corporales. Se transmite fcilmente de persona a persona (es contagiosa). La mejor manera de prevenir la gripe es aplicndose la vacuna contra la gripe todos los aos. Cules son las causas? La causa de esta afeccin es el virus de la influenza. Puede contraer el virus de las siguientes maneras:  Respirar las gotitas que estn en el aire y que provienen de la tos o el estornudo de una persona que tiene el virus.  Tocar algo que tiene el virus (est contaminado) y luego tocarse la boca, la nariz o los ojos. Qu incrementa el riesgo? Hay ciertas cosas que lo pueden hacer ms propenso a tener gripe. Estas incluyen lo siguiente:  No lavarse las manos con frecuencia.  Tener contacto cercano con muchas personas durante la temporada de resfro y gripe.  Tocarse la boca, los ojos o la nariz sin antes lavarse las manos.  No recibir la vacuna antigripal todos los aos. Puede correr un mayor riesgo de tener gripe, junto con problemas graves como una infeccin pulmonar (neumona), si:  Es mayor de 65 aos de edad.  Est embarazada.  Tiene debilitado el sistema que combate las defensas (sistema inmunitario) debido a una enfermedad o porque toma determinados medicamentos.  Tiene una enfermedad prolongada (crnica), por ejemplo: ? Enfermedad cardaca, renal o pulmonar. ? Diabetes. ? Asma.  Tiene un trastorno heptico.  Tiene mucho sobrepeso (obesidad mrbida).  Tiene anemia. Esta es una afeccin que afecta a los glbulos rojos. Cules son los signos o los sntomas? Los sntomas normalmente comienzan de repente y duran entre 4 y 14 das. Pueden incluir los siguientes:  Fiebre y escalofros.  Dolores de  cabeza, dolores en el cuerpo o dolores musculares.  Dolor de garganta.  Tos.  Secrecin o congestin nasal.  Malestar en el pecho.  No desear comer en las cantidades normales (prdida del apetito).  Debilidad o cansancio (fatiga).  Mareos.  Malestar estomacal (nuseas) o ganas de devolver (vmitos). Cmo se trata? Si la gripe se encuentra de forma temprana, se la puede tratar con medicamentos que pueden ayudar a reducir la gravedad de la enfermedad y reducir su duracin (medicamentos antivirales). Estos pueden administrarse por boca (va oral) o por va (catter) intravenosa. Cuidarse en su hogar puede ayudar a que mejoren los sntomas. El mdico puede sugerirle lo siguiente:  Tomar medicamentos de venta libre.  Beber mucho lquido. La gripe suele desaparecer sola. Si tiene sntomas muy graves u otros problemas, puede recibir tratamiento en un hospital. Siga estas indicaciones en su casa:     Actividad  Descanse todo lo que sea necesario. Duerma lo suficiente.  Qudese en su casa y no concurra al trabajo o a la escuela, como se lo haya indicado el mdico. ? No salga de su casa hasta que no haya tenido fiebre por 24horas sin tomar medicamentos. ? Salga de su casa solo para ir al mdico. Comida y bebida  Tome una SRO (solucin de rehidratacin oral). Es una bebida que se vende en farmacias y tiendas.  Beba suficiente lquido para mantener el pis (la orina) de color amarillo plido.  En la medida en que pueda, beba lquidos claros en pequeas cantidades. Los lquidos transparentes son, por ejemplo: ? Agua. ? Trocitos   una SRO (solucin de rehidratacin oral). Es una bebida que se vende en farmacias y tiendas.   Beba suficiente lquido para mantener el pis (la orina) de color amarillo plido.   En la medida en que pueda, beba lquidos claros en pequeas cantidades. Los lquidos transparentes son, por ejemplo:  ? Agua.  ? Trocitos de hielo.  ? Jugo de frutas con agua agregada (jugo de frutas diluido).  ? Bebidas deportivas de bajas caloras.   En la medida en que pueda, consuma alimentos blandos y fciles de digerir en pequeas cantidades. Estos alimentos incluyen:  ? Bananas.  ? Pur de manzana.  ? Arroz.  ? Carnes magras.  ? Tostadas.  ? Galletas.   No coma ni beba lo  siguiente:  ? Lquidos con alto contenido de azcar o cafena.  ? Alcohol.  ? Alimentos condimentados o con alto contenido de grasa.  Indicaciones generales   Tome los medicamentos de venta libre y los recetados solamente como se lo haya indicado el mdico.   Use un humidificador de aire fro para que el aire de su casa est ms hmedo. Esto puede facilitar la respiracin.   Al toser o estornudar, cbrase la boca y la nariz.   Lvese las manos con agua y jabn frecuentemente, en especial despus de toser o estornudar. Use desinfectante para manos con alcohol si no dispone de agua y jabn.   Concurra a todas las visitas de control como se lo haya indicado el mdico. Esto es importante.  Cmo se evita?     Colquese la vacuna antigripal todos los aos. Puede colocarse la vacuna contra la gripe a fines de verano, en otoo o en invierno. Pregntele al mdico cundo debe aplicarse la vacuna contra la gripe.   Evite el contacto con personas que estn enfermas durante el otoo y el invierno (la temporada de resfro y gripe).  Comunquese con un mdico si:   Tiene sntomas nuevos.   Tiene los siguientes sntomas:  ? Dolor en el pecho.  ? Materia fecal lquida (diarrea).  ? Fiebre.   La tos empeora.   Empieza a tener ms mucosidad.   Tiene malestar estomacal.   Vomita.  Solicite ayuda inmediatamente si:   Le falta el aire.   Tiene dificultad para respirar.   La piel o las uas se ponen de un color azulado.   Presenta dolor muy intenso o rigidez en el cuello.   Tiene dolor de cabeza repentino.   Le duele la cara o el odo de forma repentina.   No puede comer ni beber sin vomitar.  Resumen   La gripe es una infeccin en los pulmones, la nariz y la garganta. La causa un virus.   Tome los medicamentos de venta libre y los recetados solamente como se lo haya indicado el mdico.   Aplicarse la vacuna contra la gripe todos los aos es la mejor manera de evitar contagiarse la gripe.  Esta informacin no tiene  como fin reemplazar el consejo del mdico. Asegrese de hacerle al mdico cualquier pregunta que tenga.  Document Released: 01/31/2009 Document Revised: 06/17/2018 Document Reviewed: 06/17/2018  Elsevier Interactive Patient Education  2019 [employer]

## 2018-12-24 NOTE — Progress Notes (Signed)
Subjective:    Patient ID: Alicia Miller, female    DOB: [birth year], 43 y.o.   MRN: [id number]  HPI       43 yo female with a history of recurrent UTI and kidney stones. Patient reports no current urinary symptoms and has appointment today with Urology. Patient reports that she has had a sore throat for the past 2 days as well as fatigue, frontal headache and chills. Her son was recently diagnosed with influenza last Thursday and now her husband is sick as well. Patient denies any cough or body aches at this time.  Past Medical History:  Diagnosis Date  . 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 Cancer (HCC)    Uterine  . Depression   . Left-sided Bell's palsy 01/19/2016  . No pertinent past medical history   . Varicose veins of left leg with edema    Social History   Tobacco Use  . Smoking status: Never Smoker  . Smokeless tobacco: Never Used  Substance Use Topics  . Alcohol use: No  . Drug use: No   Allergies  Allergen Reactions  . Bactrim [Sulfamethoxazole-Trimethoprim] Itching     Review of Systems  Constitutional: Positive for chills and fatigue. Negative for fever.  HENT: Positive for sore throat. Negative for congestion, ear pain, mouth sores, nosebleeds, postnasal drip, rhinorrhea and trouble swallowing.   Respiratory: Negative for cough, shortness of breath and wheezing.   Cardiovascular: Negative for chest pain, palpitations and leg swelling.  Gastrointestinal: Positive for nausea. Negative for abdominal pain.  Genitourinary: Negative for dysuria and frequency.  Musculoskeletal: Negative for arthralgias, back pain and myalgias.  Neurological: Positive for dizziness and headaches.       Objective:   Physical Exam BP 119/71 (BP Location: Left Arm, Patient Position: Sitting, Cuff Size: Normal)   Pulse 85   Temp 98.2 F (36.8 C) (Oral)   Resp 16   Wt 144 lb 3.2  oz (65.4 kg)   LMP 12/21/2018 (Exact Date)   SpO2 97%   BMI 26.37 kg/m Nurse's notes and vital signs reviewed General-well-nourished, well-developed female with a slight nasal quality to her voice in no acute distress but patient appears fatigued ENT-TMs dull, nares with moderate edema of the nasal turbinates with mild clear nasal discharge, patient with tonsillar arch edema/erythema with possible exudate Neck-supple, patient with some mild anterior cervical chain lymphadenopathy Lungs-clear to auscultation bilaterally Cardiovascular-regular rate and rhythm Abdomen-nontender Back-no CVA tenderness      Assessment & Plan:  1. Pharyngitis, unspecified etiology Patient with recent exposure to influenza and now with sore throat, chills and fatigue.  Patient will be placed on azithromycin which would cover strep if present.  Patient is encouraged to gargle with warm salt water and take Motrin as needed for throat pain or fever if this develops.  Patient will also be placed on Tamiflu. - azithromycin (ZITHROMAX) 250 MG tablet; Take 2 pills today and then 1 pill daily for 4 days  Dispense: 6 tablet; Refill: 0 - oseltamivir (TAMIFLU) 75 MG capsule; Take 1 capsule (75 mg total) by mouth 2 (two) times daily for 5 days.  Dispense: 10 capsule; Refill: 0  2. Exposure to influenza Patient with exposure to influenza as her son was recently diagnosed with influenza last Thursday.  Patient with sore throat/pharyngitis and chills as well as fatigue.  Patient will be placed on azithromycin  and patient is encouraged to take over-the-counter medications as needed as other symptoms of the flu which may develop.  Prescription provided for Tamiflu to take twice daily x5 days but patient was made aware that this may only slightly shorten the duration of her symptoms as patient is not sure if she has had symptoms for 2 to 3 days at this point.  Patient is encouraged to rest and remain well-hydrated and handout provided  regarding influenza as part of AVS.  An After Visit Summary was printed and given to the patient.  Return if symptoms worsen or fail to improve.  Work note provided for patient to be out of work until Monday - azithromycin (ZITHROMAX) 250 MG tablet; Take 2 pills today and then 1 pill daily for 4 days  Dispense: 6 tablet; Refill: 0 - oseltamivir (TAMIFLU) 75 MG capsule; Take 1 capsule (75 mg total) by mouth 2 (two) times daily for 5 days.  Dispense: 10 capsule; Refill: 0

## 2019-01-21 ENCOUNTER — Ambulatory Visit: Payer: Self-pay | Attending: Family Medicine | Admitting: Physician Assistant

## 2019-01-21 VITALS — BP 146/92 | HR 124 | Temp 99.4°F | Ht 62.0 in | Wt 142.6 lb

## 2019-01-21 DIAGNOSIS — J111 Influenza due to unidentified influenza virus with other respiratory manifestations: Secondary | ICD-10-CM

## 2019-01-21 MED ORDER — OSELTAMIVIR PHOSPHATE 75 MG PO CAPS: 75.0000 mg | ORAL_CAPSULE | Freq: Two times a day (BID) | ORAL | 0 refills | Status: DC

## 2019-01-21 MED FILL — TAMIFLU 75 MG GELCAP: 75 | 5 days supply | Qty: 10 | Fill #0

## 2019-01-21 NOTE — Patient Instructions (Addendum)
Drink at least 100 ounces of water daily.  Treat your fevers/headaches, and bodyaches with advil or tylenol.     Gripe en los adultos Influenza, Adult A la gripe tambin se la conoce como "influenza". Es una [employer], la nariz y la garganta (vas respiratorias). La causa un virus. La gripe provoca sntomas que son similares a los de un resfro. Tambin causa fiebre alta y dolores corporales. Se transmite fcilmente de persona a persona (es contagiosa). La mejor manera de prevenir la gripe es aplicndose la vacuna contra la gripe todos los aos. Cules son las causas? La causa de esta afeccin es el virus de la influenza. Puede contraer el virus de las siguientes maneras:  [place] gotitas que estn en el aire y que provienen de la tos o el estornudo de una persona que tiene el virus.  Tocar algo que tiene el virus (est contaminado) y luego tocarse la boca, la nariz o los ojos. Qu incrementa el riesgo? Hay ciertas cosas que lo pueden hacer ms propenso a Nurse, adult. Estas incluyen lo siguiente:  No lavarse las manos con frecuencia.  Tener contacto cercano con [employer] durante la temporada de resfro y gripe.  Tocarse la boca, los ojos o la nariz sin antes lavarse las manos.  No recibir la [employer]. Puede correr un mayor riesgo de tener gripe, junto con problemas graves como una infeccin pulmonar (neumona), si:  Es mayor de 37 aos de edad.  Est embarazada.  Tiene debilitado el sistema que combate las defensas (sistema inmunitario) debido a una enfermedad o porque toma determinados medicamentos.  Tiene una enfermedad prolongada (crnica), por ejemplo: ? Enfermedad cardaca, renal o pulmonar. ? Diabetes. ? Asma.  Tiene un trastorno heptico.  Tiene mucho sobrepeso (obesidad [place]).  Tiene anemia. Esta es una afeccin que afecta a los glbulos rojos. Cules son los signos o los sntomas? Los sntomas normalmente  comienzan de repente y Sonda Primes [address] y [address]. Pueden incluir los siguientes:  Cristy Hilts y [place].  Dolores de [place], dolores en el cuerpo o dolores musculares.  Dolor de Investment banker, operational.  Tos.  Secrecin o congestin nasal.  Immunologist.  No desear comer en las cantidades normales (prdida del apetito).  Debilidad o cansancio (fatiga).  Mareos.  Malestar estomacal (nuseas) o ganas de devolver (vmitos). Cmo se trata? Si la gripe se encuentra de forma temprana, se la puede tratar con medicamentos que pueden ayudar a reducir la gravedad de la enfermedad y reducir su duracin (medicamentos antivirales). Estos pueden administrarse por boca (va oral) o por va (catter) intravenosa. Cuidarse en su hogar puede ayudar a que mejoren los sntomas. El mdico puede sugerirle lo siguiente:  Tomar medicamentos de Radio broadcast assistant.  Beber mucho lquido. La gripe suele desaparecer sola. Si tiene sntomas muy graves u otros problemas, puede recibir tratamiento en un hospital. Siga estas indicaciones en su casa:     Actividad  Descanse todo lo que sea necesario. Duerma lo suficiente.  Foy [place] en su casa y no concurra al [place] o a la escuela, como se lo haya indicado el mdico. ? No salga de su casa hasta que no haya tenido fiebre por 24horas sin tomar medicamentos. ? Salga de su casa solo para ir al [employer]. Comida y bebida  Wyatt Haste SRO (solucin de rehidratacin oral). Es Ardelia Mems bebida que se vende en farmacias y tiendas.  Beba suficiente lquido para Contractor pis (la orina) de color amarillo plido.  En la medida en que pueda, beba lquidos claros en pequeas cantidades. Los lquidos transparentes son, por ejemplo: ? Grayce Sessions. ? Trocitos de hielo. ? Jugo de frutas con agua agregada (jugo de frutas diluido). ? Bebidas deportivas de bajas caloras.  En la medida en que pueda, consuma alimentos blandos y fciles de digerir en pequeas cantidades. Estos alimentos  incluyen: ? Bananas. ? Pur de [employer]. ? Arroz. ? [employer]. ? Tostadas. ? Galletas.  No coma ni beba lo siguiente: ? Lquidos con alto contenido de azcar o cafena. ? Alcohol. ? Alimentos condimentados o con alto contenido de [place]. Indicaciones generales  [employer] de venta libre y los recetados solamente como se lo haya indicado el mdico.  Use un humidificador de aire fro para que el aire de su casa est ms hmedo. Esto puede facilitar la respiracin.  Al toser o estornudar, cbrase la boca y la [place].  Lvese las manos con agua y jabn frecuentemente, en especial despus de toser o Brewing technologist. Use desinfectante para manos con alcohol si no dispone de [place].  Concurra a todas las visitas de control como se lo haya indicado el mdico. Esto es importante. Cmo se evita?   Colquese la vacuna antigripal todos los [place]. Puede colocarse la vacuna contra la gripe a fines de verano, en otoo o en invierno. Pregntele al mdico cundo debe aplicarse la vacuna contra la gripe.  Evite el contacto con personas que estn enfermas durante el otoo y el invierno (la temporada de resfro y gripe). Comunquese con un mdico si:  Tiene sntomas nuevos.  Tiene los siguientes sntomas: ? Tourist information centre manager. ? Materia fecal lquida (diarrea). ? Fiebre.  La tos empeora.  Empieza a tener ms mucosidad.  Tiene Higher education careers adviser.  Vomita. Solicite ayuda inmediatamente si:  Le falta el aire.  Tiene dificultad para respirar.  La piel o las uas se ponen de un color azulado.  Presenta dolor muy intenso o rigidez en el cuello.  Tiene dolor de cabeza repentino.  Le duele la cara o el odo de forma repentina.  No puede comer ni beber sin vomitar. Resumen  La gripe es una infeccin en los pulmones, la nariz y Patent examiner. La causa un virus.  Tome los medicamentos de venta libre y los recetados solamente como se lo haya indicado el mdico.  Aplicarse  la vacuna contra la gripe todos los aos es la mejor manera de evitar contagiarse la gripe. Esta informacin no tiene Marine scientist el consejo del mdico. Asegrese de hacerle al mdico cualquier pregunta que tenga. Document Released: 01/31/2009 Document Revised: 06/17/2018 Document Reviewed: 06/17/2018 Elsevier Interactive Patient Education  2019 [employer].

## 2019-01-21 NOTE — Progress Notes (Signed)
Patient stated she have been having allergies for 3 weeks.   Pt. Stated it have not improve with shiver, and have not been able to check for fever.

## 2019-01-21 NOTE — Progress Notes (Signed)
Patient ID: Alicia Miller, female   DOB: 05-31-1976, 43 y.o.   MRN: [id number]      Alicia Miller, is a 43 y.o. female  ZWC:[id number]  MPN:[id number]  DOB - 03-29-1976  Subjective:  Chief Complaint and HPI: Alicia Miller is a 43 y.o. female here today Cough, fever and body aches since yesterday.  Definite exposure to flu about 3 weeks ago.  Symptoms started suddenly yesterday.  No recent travel or exposure to coronavirus.    [employer] interpreters used.    ROS:   Constitutional:  No f/c, No night sweats, No unexplained weight loss. EENT:  No vision changes, No blurry vision, No hearing changes. +sneezing, ST Respiratory: + cough, No SOB Cardiac: No CP, no palpitations GI:  No abd pain, No N/V/D. GU: No Urinary s/sx Musculoskeletal: +body aches Neuro: No headache, no dizziness, no motor weakness.  Skin: No rash Endocrine:  No polydipsia. No polyuria.  Psych: Denies SI/HI  No problems updated.  ALLERGIES: Allergies  Allergen Reactions  . Bactrim [Sulfamethoxazole-Trimethoprim] Itching    PAST MEDICAL HISTORY: Past Medical History:  Diagnosis Date  . 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 Cancer (HCC)    Uterine  . Depression   . Left-sided Bell's palsy 01/19/2016  . No pertinent past medical history   . Varicose veins of left leg with edema     MEDICATIONS AT HOME: Prior to Admission medications   Medication Sig Start Date End Date Taking? Authorizing Provider  oseltamivir (TAMIFLU) 75 MG capsule Take 1 capsule (75 mg total) by mouth 2 (two) times daily. 01/21/19   Argentina Donovan, PA-C     Objective:  EXAM:   Vitals:   01/21/19 1439  BP: (!) 146/92  Pulse: (!) 124  Temp: 99.4 F (37.4 C)  TempSrc: Oral  SpO2: 100%  Weight: 142 lb 9.6 oz (64.7 kg)  Height: 5\' 2"  (1.575 m)    General appearance : A&OX3. NAD. Non-toxic-appearing, but she  does appear ill HEENT: Atraumatic and Normocephalic.  PERRLA. EOM intact.  TM full B. Mouth-MMM, post pharynx w/ mild erythema, no exudate, + PND. Neck: supple, no JVD. No cervical lymphadenopathy. No thyromegaly Chest/Lungs:  Breathing-non-labored, Good air entry bilaterally, breath sounds normal without rales, rhonchi, or wheezing  CVS: S1 S2 regular, no murmurs, gallops, rubs.  Rate at 96 on exam Extremities: Bilateral Lower Ext shows no edema, both legs are warm to touch with = pulse throughout Neurology:  CN II-XII grossly intact, Non focal.   Psych:  TP linear. J/I WNL. Normal speech. Appropriate eye contact and affect.  Skin:  No Rash  Data Review Lab Results  Component Value Date   HGBA1C 5.5 07/31/2018   HGBA1C 5.4 10/22/2017   HGBA1C 5.3 01/19/2016     Assessment & Plan   1. Influenza Definite flu exposure.  Fluids, rest, respiratory care.  Tylenol or advil for pain/fever.  Hydration imperative - oseltamivir (TAMIFLU) 75 MG capsule; Take 1 capsule (75 mg total) by mouth 2 (two) times daily.  Dispense: 10 capsule; Refill: 0     Patient have been counseled extensively about nutrition and exercise  Return if symptoms worsen or fail to improve.  The patient was given clear instructions to go to ER or return to medical center if symptoms don't improve, worsen or new problems develop. The patient verbalized understanding. The patient was told to  call to get lab results if they haven't heard anything in the next week.     Freeman Caldron, PA-C [facility] [place], [place]   01/21/2019, 2:50 PM

## 2019-01-26 ENCOUNTER — Encounter (HOSPITAL_COMMUNITY): Payer: Self-pay | Admitting: Emergency Medicine

## 2019-01-26 ENCOUNTER — Ambulatory Visit (HOSPITAL_COMMUNITY)
Admission: EM | Admit: 2019-01-26 | Discharge: 2019-01-26 | Disposition: A | Discharge status: Home / Self Care | Payer: Self-pay | Attending: Family Medicine | Admitting: Family Medicine

## 2019-01-26 DIAGNOSIS — J029 Acute pharyngitis, unspecified: Secondary | ICD-10-CM | POA: Insufficient documentation

## 2019-01-26 LAB — POCT RAPID STREP A: Streptococcus, Group A Screen (Direct): NEGATIVE

## 2019-01-26 NOTE — Discharge Instructions (Addendum)
The strep test was negative This is most likely a viral illness He can do ibuprofen or Tylenol for pain Warm salt water gargles can help with the throat discomfort Follow up as needed for continued or worsening symptoms

## 2019-01-26 NOTE — ED Triage Notes (Signed)
Pt states last week she was dx with the flu but she feels mostly better but her throat hurts.

## 2019-01-27 NOTE — ED Provider Notes (Signed)
[facility]    CSN: [id number] Arrival date & time: 01/26/19  1024     History   Chief Complaint Chief Complaint  Patient presents with  . Sore Throat    HPI Alicia Miller is a 43 y.o. female.   Patient is a 43 year old female who presents with sore throat.  This is been constant for the past 2 days.  She was seen on 01/21/2019 and diagnosed with flu.  She just finished the Tamiflu.  Most of those symptoms have resolved.  She denies any cough, congestion, fevers, rhinorrhea.  She denies any trouble swallowing or breathing.  She is reporting she saw white spots on the back of her throat.  No recent sick contacts or recent traveling.  ROS per HPI    Sore Throat     Past Medical History:  Diagnosis Date  . 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 Cancer (HCC)    Uterine  . Depression   . Left-sided Bell's palsy 01/19/2016  . No pertinent past medical history   . Varicose veins of left leg with edema     Patient Active Problem List   Diagnosis Date Noted  . Epigastric pain 10/22/2017  . Acute pain of right shoulder 10/08/2017  . Nevus, non-neoplastic 08/06/2017  . Dermatofibroma 08/06/2017  . Seborrheic keratoses 06/12/2017  . 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 Pruritic erythematous rash 06/12/2017  . Varicose veins of left lower extremity with complications 28/31/5176  . Pain in left eye 04/10/2017  . Positive H. pylori test 12/12/2016  . Laryngospasm 12/10/2016  . Right lower quadrant abdominal pain 11/07/2016  . Vitamin D insufficiency 08/16/2016  . Joint laxity of right knee 08/15/2016  . Chronic pain of left ankle 08/15/2016  . Chronic pain of right ankle 08/15/2016  . Other fatigue 08/15/2016  . Stress incontinence, female 02/12/2016  . Breast tenderness in female 02/12/2016  . Microscopic hematuria 08/11/2015  . Allergic rhinitis 08/11/2015   . Snoring 08/11/2015  . Laryngopharyngeal reflux 08/11/2015  . Biliary calculi 06/13/2015  . Back muscle spasm 04/22/2014    Past Surgical History:  Procedure Laterality Date  . CHOLECYSTECTOMY  05/2015  . ENDOVENOUS ABLATION SAPHENOUS VEIN W/ LASER Left 12/08/2017   endovenous laser ablation left greater saphenous vein and stab phlebectomy 10-20 incisions left leg by Tinnie Gens MD     OB History    Gravida  2   Para  2   Term  2   Preterm  0   AB  0   Living  2     SAB  0   TAB  0   Ectopic      Multiple  0   Live Births  2            Home Medications    Prior to Admission medications   Medication Sig Start Date End Date Taking? Authorizing Provider  oseltamivir (TAMIFLU) 75 MG capsule Take 1 capsule (75 mg total) by mouth 2 (two) times daily. 01/21/19   Argentina Donovan, PA-C    Family History Family History  Problem Relation Age of Onset  . Diabetes Father   . Cancer Sister        uterine  . Diabetes Paternal Grandmother   . Diabetes Paternal Uncle   . Diabetes Paternal Uncle   . Diabetes Paternal Uncle   .  Diabetes Paternal Uncle   . Diabetes Paternal Uncle   . Diabetes Paternal Uncle     Social History Social History   Tobacco Use  . Smoking status: Never Smoker  . Smokeless tobacco: Never Used  Substance Use Topics  . Alcohol use: No  . Drug use: No     Allergies   Bactrim [sulfamethoxazole-trimethoprim]   Review of Systems Review of Systems   Physical Exam Triage Vital Signs ED Triage Vitals  Enc Vitals Group     BP 01/26/19 1128 114/72     Pulse Rate 01/26/19 1128 81     Resp 01/26/19 1128 16     Temp 01/26/19 1128 97.8 F (36.6 C)     Temp src --      SpO2 01/26/19 1128 100 %     Weight --      Height --      Head Circumference --      Peak Flow --      Pain Score 01/26/19 1129 2     Pain Loc --      Pain Edu? --      Excl. in [place]? --    No data found.  Updated Vital Signs BP 114/72   Pulse 81    Temp 97.8 F (36.6 C)   Resp 16   LMP 01/18/2019   SpO2 100%   Visual Acuity Right Eye Distance:   Left Eye Distance:   Bilateral Distance:    Right Eye Near:   Left Eye Near:    Bilateral Near:     Physical Exam Vitals signs and nursing note reviewed.  Constitutional:      General: She is not in acute distress.    Appearance: She is well-developed. She is not ill-appearing, toxic-appearing or diaphoretic.  HENT:     Head: Normocephalic and atraumatic.     Right Ear: Tympanic membrane and ear canal normal.     Left Ear: Tympanic membrane and ear canal normal.     Nose: No congestion.     Mouth/Throat:     Tonsils: Tonsillar exudate present. Swelling: 2+ on the right. 2+ on the left.  Eyes:     Conjunctiva/sclera: Conjunctivae normal.  Neck:     Musculoskeletal: Normal range of motion.  Cardiovascular:     Rate and Rhythm: Normal rate and regular rhythm.  Pulmonary:     Effort: Pulmonary effort is normal.     Breath sounds: Normal breath sounds.  Lymphadenopathy:     Cervical: Cervical adenopathy present.  Skin:    General: Skin is warm and dry.  Neurological:     Mental Status: She is alert.  Psychiatric:        Mood and Affect: Mood normal.      UC Treatments / Results  Labs (all labs ordered are listed, but only abnormal results are displayed) Labs Reviewed  CULTURE, GROUP A STREP [facility])  POCT RAPID STREP A    EKG None  Radiology No results found.  Procedures Procedures (including critical care time)  Medications Ordered in UC Medications - No data to display  Initial Impression / Assessment and Plan / UC Course  I have reviewed the triage vital signs and the nursing notes.  Pertinent labs & imaging results that were available during my care of the patient were reviewed by me and considered in my medical decision making (see chart for details).     Rapid strep test negative.  This is most likely a  viral illness.  Symptomatic treatment with  ibuprofen, Tylenol or warm salt water gargles Follow up as needed for continued or worsening symptoms  Final Clinical Impressions(s) / UC Diagnoses   Final diagnoses:  Viral pharyngitis     Discharge Instructions     The strep test was negative This is most likely a viral illness He can do ibuprofen or Tylenol for pain Warm salt water gargles can help with the throat discomfort Follow up as needed for continued or worsening symptoms     ED Prescriptions    None     Controlled Substance Prescriptions [place] Controlled Substance Registry consulted? no   Loura Halt A, NP 01/27/19 1006

## 2019-01-28 LAB — CULTURE, GROUP A STREP (THRC)

## 2019-02-04 ENCOUNTER — Ambulatory Visit (INDEPENDENT_AMBULATORY_CARE_PROVIDER_SITE_OTHER): Payer: Self-pay | Admitting: Otolaryngology

## 2019-03-02 ENCOUNTER — Other Ambulatory Visit: Payer: Self-pay | Admitting: Obstetrics and Gynecology

## 2019-03-02 DIAGNOSIS — Z1231 Encounter for screening mammogram for malignant neoplasm of breast: Secondary | ICD-10-CM

## 2019-03-03 ENCOUNTER — Other Ambulatory Visit (HOSPITAL_COMMUNITY): Payer: Self-pay | Admitting: *Deleted

## 2019-03-03 DIAGNOSIS — Z1231 Encounter for screening mammogram for malignant neoplasm of breast: Secondary | ICD-10-CM

## 2019-03-23 ENCOUNTER — Ambulatory Visit (HOSPITAL_COMMUNITY): Payer: Self-pay

## 2019-05-03 ENCOUNTER — Ambulatory Visit: Payer: Self-pay | Attending: Family Medicine

## 2019-05-03 ENCOUNTER — Other Ambulatory Visit: Payer: Self-pay

## 2019-05-07 ENCOUNTER — Telehealth: Payer: Self-pay | Admitting: Family Medicine

## 2019-05-07 NOTE — Telephone Encounter (Signed)
Patient called checking on the status of her oc AND bc please follow up

## 2019-05-07 NOTE — Telephone Encounter (Signed)
Please follow up

## 2019-05-10 NOTE — Telephone Encounter (Signed)
Pt was informed that she is approve for OC and Rx and the cards was sent by mail

## 2019-07-09 ENCOUNTER — Other Ambulatory Visit: Payer: Self-pay

## 2019-07-09 ENCOUNTER — Ambulatory Visit: Payer: Self-pay | Attending: Family Medicine

## 2019-07-14 MED FILL — AMOXICILLIN 500 MG CAPSULE: 500 | 10 days supply | Qty: 30 | Fill #0

## 2019-07-20 ENCOUNTER — Ambulatory Visit (HOSPITAL_COMMUNITY)
Admission: RE | Admit: 2019-07-20 | Discharge: 2019-07-20 | Disposition: A | Discharge status: Home / Self Care | Payer: Self-pay | Source: Ambulatory Visit | Attending: Obstetrics and Gynecology | Admitting: Obstetrics and Gynecology

## 2019-07-20 ENCOUNTER — Other Ambulatory Visit: Payer: Self-pay

## 2019-07-20 ENCOUNTER — Ambulatory Visit
Admission: RE | Admit: 2019-07-20 | Discharge: 2019-07-20 | Disposition: A | Discharge status: Home / Self Care | Payer: No Typology Code available for payment source | Source: Ambulatory Visit | Attending: Obstetrics and Gynecology | Admitting: Obstetrics and Gynecology

## 2019-07-20 ENCOUNTER — Encounter (HOSPITAL_COMMUNITY): Payer: Self-pay

## 2019-07-20 DIAGNOSIS — Z1239 Encounter for other screening for malignant neoplasm of breast: Secondary | ICD-10-CM

## 2019-07-20 DIAGNOSIS — Z1231 Encounter for screening mammogram for malignant neoplasm of breast: Secondary | ICD-10-CM

## 2019-07-20 MED FILL — CLINDAMYCIN HCL 300 MG CAP: 300 | 10 days supply | Qty: 40 | Fill #0

## 2019-07-20 NOTE — Progress Notes (Signed)
No complaints today.   Pap Smear: Pap smear completed today. Last Pap smear was 01/30/2017 at [facility] and normal with negative HPV. Patient has a history of an abnormal Pap smear 10/06/2015 at [facility] that showed endocervical adenocarcinoma in situ. A LEEP was completed for follow up 12/01/2015. Patient also has a history of a Pap smear 10/06/2014 that was normal and positive for HPV. A colposcopy was completed to follow up 11/03/2014. The above results and Pap smear result from 07/25/2016 are in EPIC.  Physical exam: Breasts Breasts symmetrical. No skin abnormalities bilateral breasts. No nipple retraction bilateral breasts. No nipple discharge bilateral breasts. No lymphadenopathy. No lumps palpated bilateral breasts. No complaints of pain or tenderness on exam. Referred patient to the [facility] for a screening mammogram. Appointment scheduled for Tuesday, July 20, 2019 at 1620.        Pelvic/Bimanual   Ext Genitalia No lesions, no swelling and no discharge observed on external genitalia.         Vagina Vagina pink and normal texture. No lesions or discharge observed in vagina.          Cervix Cervix is present. Cervix pink and of normal texture. No discharge observed.     Uterus Uterus is present and palpable. Uterus in normal position and normal size.        Adnexae Bilateral ovaries present and palpable. No tenderness on palpation.         Rectovaginal No rectal exam completed today since patient had no rectal complaints. No skin abnormalities observed on exam.    Smoking History: Patient has never smoked.  Patient Navigation: Patient education provided. Access to services provided for patient through [facility] program. Spanish interpreter provided.   Breast and Cervical Cancer Risk Assessment: Patient has a family history of a paternal aunt having breast cancer. Patient has no known genetic mutations or history of radiation treatment to  the chest before age 90. Patient has a history of cervical dysplasia. Patient has no history of being immunocompromised or DES exposure in-utero.  Risk Assessment    Risk Scores      07/20/2019   Last edited by: Armond Hang, LPN   5-year risk: 0.5 %   Lifetime risk: 8.5 %         Used Spanish interpreter Rudene Anda from [place].

## 2019-07-20 NOTE — Patient Instructions (Signed)
Explained breast self awareness with Alicia Miller. Let patient know that her next Pap smear is due in one year due to her history of endocervical adenocarcinoma. Referred patient to the [facility] for a screening mammogram. Appointment scheduled for Tuesday, July 20, 2019 at 1620. Patient aware of appointment and will be there. Let patient know will follow up with her within the next couple weeks with results of Pap smear by letter or phone. Informed patient that the [facility] will follow-up with her within the next couple of weeks with results of mammogram by letter or phone. [name] verbalized understanding.  Jorden Mahl, [name], RN 3:19 PM

## 2019-07-22 LAB — CYTOLOGY - PAP
Diagnosis: NEGATIVE
HPV: NOT DETECTED

## 2019-07-28 ENCOUNTER — Ambulatory Visit: Payer: Self-pay | Attending: Critical Care Medicine | Admitting: Critical Care Medicine

## 2019-07-28 ENCOUNTER — Ambulatory Visit (HOSPITAL_BASED_OUTPATIENT_CLINIC_OR_DEPARTMENT_OTHER): Payer: Self-pay | Admitting: Pharmacist

## 2019-07-28 ENCOUNTER — Encounter: Payer: Self-pay | Admitting: Critical Care Medicine

## 2019-07-28 ENCOUNTER — Other Ambulatory Visit: Payer: Self-pay

## 2019-07-28 VITALS — BP 120/75 | HR 67 | Temp 98.2°F | Resp 16 | Wt 149.0 lb

## 2019-07-28 DIAGNOSIS — N941 Unspecified dyspareunia: Secondary | ICD-10-CM | POA: Insufficient documentation

## 2019-07-28 DIAGNOSIS — R5383 Other fatigue: Secondary | ICD-10-CM

## 2019-07-28 DIAGNOSIS — R4586 Emotional lability: Secondary | ICD-10-CM | POA: Insufficient documentation

## 2019-07-28 DIAGNOSIS — Z9049 Acquired absence of other specified parts of digestive tract: Secondary | ICD-10-CM | POA: Insufficient documentation

## 2019-07-28 DIAGNOSIS — Z23 Encounter for immunization: Secondary | ICD-10-CM

## 2019-07-28 DIAGNOSIS — N920 Excessive and frequent menstruation with regular cycle: Secondary | ICD-10-CM

## 2019-07-28 NOTE — Assessment & Plan Note (Signed)
The patient had chronic fatigue diagnosed previously will rule out evidence for low iron levels

## 2019-07-28 NOTE — Patient Instructions (Addendum)
Labs today will include a metabolic panel complete blood count thyroid panel and iron level we will call you with results  Keep your upcoming appointment for your physical and follow-up and we may yet refer you to gynecology for further evaluation of your uterus  A flu vaccine was given   Influenza Virus Vaccine injection (Fluarix) Qu es este medicamento? La VACUNA ANTIGRIPAL ayuda a disminuir el riesgo de contraer la influenza, tambin conocida como la gripe. La vacuna solo ayuda a protegerle contra algunas cepas de influenza. Esta vacuna no ayuda a reducir Catering manager de contraer influenza pandmica H1N1. Este medicamento puede ser utilizado para otros usos; si tiene alguna pregunta consulte con su proveedor de atencin mdica o con su farmacutico. MARCAS COMUNES: Fluarix, Fluzone Qu le debo informar a mi profesional de la salud antes de tomar este medicamento? Necesita saber si usted presenta alguno de los siguientes problemas o situaciones:  trastorno de sangrado como hemofilia  fiebre o infeccin  sndrome de Guillain-Barre u otros problemas neurolgicos  problemas del sistema inmunolgico  infeccin por el virus de la inmunodeficiencia humana (VIH) o SIDA  niveles bajos de plaquetas en la sangre  esclerosis mltiple  una reaccin IT consultant o inusual a las vacunas antigripales, a los huevos, protenas de pollo, al ltex, a la gentamicina, a otros medicamentos, alimentos, colorantes o conservantes  si est embarazada o buscando quedar embarazada  si est amamantando a un beb Cmo debo [employer]? Esta vacuna se administra mediante inyeccin por va intramuscular. Lo administra un profesional de [employer]	[place]. Recibir una copia de informacin escrita sobre la vacuna antes de cada vacuna. Asegrese de leer este folleto cada vez cuidadosamente. Este folleto puede cambiar con frecuencia. Hable con su pediatra para informarse acerca del uso de este medicamento en  nios. Puede requerir atencin especial. Sobredosis: Pngase en contacto inmediatamente con un centro toxicolgico o una sala de urgencia si usted cree que haya tomado demasiado medicamento. ATENCIN: [employer] es solo para usted. No comparta este medicamento con nadie. Qu sucede si me olvido de una dosis? No se aplica en este caso. Qu puede interactuar con este medicamento?  quimioterapia o radioterapia  medicamentos que suprimen el sistema inmunolgico, tales como etanercept, anakinra, infliximab y adalimumab  medicamentos que tratan o previenen cogulos sanguneos, como warfarina  fenitona  medicamentos esteroideos, como la prednisona o la cortisona  teofilina  vacunas Puede ser que esta lista no menciona todas las posibles interacciones. Informe a su profesional de [employer]	[place] de [employer] productos a base de hierbas, medicamentos de [place] o suplementos nutritivos que est tomando. Si usted fuma, consume bebidas alcohlicas o si utiliza drogas ilegales, indqueselo tambin a su profesional de [employer]	[place]. Algunas sustancias pueden interactuar con su medicamento. A qu debo estar atento al usar [employer]? Informe a su mdico o a Barrister's clerk de la [employer] todos los efectos secundarios que persistan despus de 3 das. Llame a su proveedor de atencin mdica si se presentan sntomas inusuales dentro de las 6 semanas posteriores a la vacunacin. Es posible que todava pueda contraer la gripe, pero la enfermedad no ser tan fuerte como normalmente. No puede contraer la gripe de esta vacuna. La vacuna antigripal no le protege contra resfros u otras enfermedades que pueden causar [place]. Debe vacunarse cada ao. Qu efectos secundarios puedo tener al [employer] este medicamento? Efectos secundarios que debe informar a su mdico o a Barrister's clerk de la salud tan pronto como sea posible:  Company secretary  alrgicas como erupcin cutnea, picazn o urticarias, hinchazn de  la cara, labios o lengua Efectos secundarios que, por lo general, no requieren atencin mdica (debe informarlos a su mdico o a su profesional de la salud si persisten o si son molestos):  fiebre  dolor de cabeza  molestias y Forensic psychologist, sensibilidad, enrojecimiento o Estate agent de la inyeccin  cansancio o debilidad Puede ser que esta lista no menciona todos los posibles efectos secundarios. Comunquese a su mdico por asesoramiento mdico [employer]. Usted puede informar los efectos secundarios a la FDA por telfono al 1-800-FDA-1088. Dnde debo guardar mi medicina? Esta vacuna se administra solamente en clnicas, farmacias, consultorio mdico u otro consultorio de un profesional de la salud y no Sports coach en su domicilio. ATENCIN: Este folleto es un resumen. Puede ser que no cubra toda la posible informacin. Si usted tiene preguntas acerca de esta medicina, consulte con su mdico, su farmacutico o su profesional de Technical sales engineer.  2020 Elsevier/Gold Standard (2010-05-08 15:31:40)

## 2019-07-28 NOTE — Assessment & Plan Note (Signed)
The patient is complaining of increased bleeding with each cycle along along with pain both before during and after and associated fatigue and increased moodiness  Plan will be to obtain a complete blood count complete metabolic profile and iron level along with thyroid profile  The patient may yet require repeat gynecologic exam including vaginal and pelvic ultrasound

## 2019-07-28 NOTE — Progress Notes (Signed)
Subjective:    Patient ID: Alicia Miller, female    DOB: 1976/05/09, 43 y.o.   MRN: [id number]  42 y.o.F Latina who was seen by gynecology and a complaint of weakness and fatigue in excess bleeding around her menses.  Gynecology referred the patient to our clinic for primary care follow-up and laboratory evaluation of the fatigue.  This is been going on for several months and she notes every time she has a.  Both before during and after she becomes very tired and fatigued and sleepy.  She also notes nausea and dizziness.  She had a pregnancy test done that was negative.  She is noted that for the past 3 months she has had excess bleeding with each menses.  Also the patient notes pain in the pelvic area is intense both during before and after the periods.  The patient denies vaginal discharge or vaginal pain and she denies any urinary symptoms at this time.  Patient also states her sleep has been disrupted with this and also as well she has some pain in between periods in the pelvic area the patient also states that she has had increased moodiness and previously has been diagnosed with depression in the past  She has been followed for adenocarcinoma in situ of the cervix and has regular Pap smears with her gynecology physician  She agrees to receive a flu vaccine today at this visit    Past Medical History:  Diagnosis Date  . 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 Cancer (HCC)    Cervical  . Depression   . Laryngospasm 12/10/2016  . Left-sided Bell's palsy 01/19/2016  . No pertinent past medical history   . Varicose veins of left leg with edema      Family History  Problem Relation Age of Onset  . Diabetes Father   . Cancer Sister        uterine  . Diabetes Sister   . Diabetes Paternal Grandmother   . Diabetes Paternal Uncle   . Diabetes Paternal Uncle   . Diabetes Paternal  Uncle   . Diabetes Paternal Uncle   . Diabetes Paternal Uncle   . Diabetes Paternal Uncle      Social History   Socioeconomic History  . Marital status: Single    Spouse name: Not on file  . Number of children: 2  . Years of education: Not on file  . Highest education level: 6th grade  Occupational History  . Not on file  Social Needs  . Financial resource strain: Not on file  . Food insecurity    Worry: Not on file    Inability: Not on file  . Transportation needs    Medical: No    Non-medical: No  Tobacco Use  . Smoking status: Never Smoker  . Smokeless tobacco: Never Used  Substance and Sexual Activity  . Alcohol use: No  . Drug use: No  . Sexual activity: Yes  Lifestyle  . Physical activity    Days per week: Not on file    Minutes per session: Not on file  . Stress: Not on file  Relationships  . Social Herbalist on phone: Not on file    Gets together: Not on file    Attends religious service: Not on file    Active member of club or organization: Not on file  Attends meetings of clubs or organizations: Not on file    Relationship status: Not on file  . Intimate partner violence    Fear of current or ex partner: Not on file    Emotionally abused: Not on file    Physically abused: Not on file    Forced sexual activity: Not on file  Other Topics Concern  . Not on file  Social History Narrative  . Not on file     Allergies  Allergen Reactions  . Bactrim [Sulfamethoxazole-Trimethoprim] Itching     Outpatient Medications Prior to Visit  Medication Sig Dispense Refill  . amoxicillin (AMOXIL) 500 MG capsule Take 500 mg by mouth 3 (three) times daily.    Marland Kitchen ibuprofen (ADVIL) 800 MG tablet Take 800 mg by mouth every 8 (eight) hours as needed. for pain    . oseltamivir (TAMIFLU) 75 MG capsule Take 1 capsule (75 mg total) by mouth 2 (two) times daily. (Patient not taking: Reported on 07/20/2019) 10 capsule 0   No facility-administered medications  prior to visit.      Review of Systems  Constitutional: Positive for activity change and fatigue.  HENT: Negative.        Tooth pain on molar   Eyes: Negative.   Respiratory: Negative.   Gastrointestinal: Negative.   Endocrine: Negative for cold intolerance, heat intolerance, polydipsia and polyuria.  Genitourinary: Positive for dyspareunia and menstrual problem. Negative for decreased urine volume, difficulty urinating, dysuria, enuresis, flank pain, frequency, genital sores, hematuria, pelvic pain, urgency, vaginal bleeding, vaginal discharge and vaginal pain.       When tired and weak has hot flashes  Musculoskeletal: Positive for back pain.  Neurological: Positive for dizziness and headaches. Negative for tremors, seizures and light-headedness.  Hematological: Negative.   Psychiatric/Behavioral: Positive for dysphoric mood. The patient is nervous/anxious.        Objective:   Physical Exam Vitals:   07/28/19 0902  BP: 120/75  Pulse: 67  Resp: 16  Temp: 98.2 F (36.8 C)  TempSrc: Oral  SpO2: 98%  Weight: 149 lb (67.6 kg)    Gen: Pleasant, well-nourished, in no distress,  normal affect  ENT: No lesions,  mouth clear,  oropharynx clear, no postnasal drip  Neck: No JVD, no TMG, no carotid bruits  Lungs: No use of accessory muscles, no dullness to percussion, clear without rales or rhonchi  Cardiovascular: RRR, heart sounds normal, no murmur or gallops, no peripheral edema  Abdomen: soft and NT, no HSM,  BS normal  Musculoskeletal: No deformities, no cyanosis or clubbing  Neuro: alert, non focal  Skin: Warm, no lesions or rashes       Assessment & Plan:  I personally reviewed all images and lab data in the [facility] system as well as any outside material available during this office visit and agree with the  radiology impressions.   Menorrhagia with regular cycle The patient is complaining of increased bleeding with each cycle along along with pain both before  during and after and associated fatigue and increased moodiness  Plan will be to obtain a complete blood count complete metabolic profile and iron level along with thyroid profile  The patient may yet require repeat gynecologic exam including vaginal and pelvic ultrasound  Dyspareunia in female Ongoing pain with each cycle will need further evaluation  Fatigue The patient had chronic fatigue diagnosed previously will rule out evidence for low iron levels   Mayerly was seen today for fatigue.  Diagnoses and all orders  for this visit:  Menorrhagia with regular cycle -     Comprehensive metabolic panel -     Thyroid Profile -     TSH -     CBC with Differential/Platelet; Future -     Iron  Dyspareunia in female -     Comprehensive metabolic panel -     Thyroid Profile -     TSH -     CBC with Differential/Platelet; Future -     Iron  Fatigue, unspecified type -     Comprehensive metabolic panel -     Thyroid Profile -     TSH -     CBC with Differential/Platelet; Future -     Iron  Mood changes  S/P cholecystectomy  A flu vaccine was given at this visit and the patient has an upcoming physical with her primary care provider and I have encouraged her to keep that appointment

## 2019-07-28 NOTE — Assessment & Plan Note (Signed)
Ongoing pain with each cycle will need further evaluation

## 2019-07-28 NOTE — Progress Notes (Signed)
Patient presents for vaccination against influenza per orders of Dr. Joya Gaskins. Consent given. Counseling provided. No contraindications exists. Vaccine administered without incident.

## 2019-07-29 LAB — COMPREHENSIVE METABOLIC PANEL
ALT: 24 IU/L (ref 0–32)
AST: 21 IU/L (ref 0–40)
Albumin/Globulin Ratio: 1.8 (ref 1.2–2.2)
Albumin: 4.7 g/dL (ref 3.8–4.8)
Alkaline Phosphatase: 53 IU/L (ref 39–117)
BUN/Creatinine Ratio: 16 (ref 9–23)
BUN: 12 mg/dL (ref 6–24)
Bilirubin Total: 0.3 mg/dL (ref 0.0–1.2)
CO2: 21 mmol/L (ref 20–29)
Calcium: 9.4 mg/dL (ref 8.7–10.2)
Chloride: 102 mmol/L (ref 96–106)
Creatinine, Ser: 0.73 mg/dL (ref 0.57–1.00)
GFR calc Af Amer: 118 mL/min/{1.73_m2} (ref >59)
GFR calc non Af Amer: 103 mL/min/{1.73_m2} (ref >59)
Globulin, Total: 2.6 g/dL (ref 1.5–4.5)
Glucose: 97 mg/dL (ref 65–99)
Potassium: 4.4 mmol/L (ref 3.5–5.2)
Sodium: 137 mmol/L (ref 134–144)
Total Protein: 7.3 g/dL (ref 6.0–8.5)

## 2019-07-29 LAB — THYROID PANEL
Free Thyroxine Index: 1.7 (ref 1.2–4.9)
T3 Uptake Ratio: 23 % — ABNORMAL LOW (ref 24–39)
T4, Total: 7.6 ug/dL (ref 4.5–12.0)

## 2019-07-29 LAB — TSH: TSH: 3.69 u[IU]/mL (ref 0.450–4.500)

## 2019-07-29 LAB — IRON: Iron: 60 ug/dL (ref 27–159)

## 2019-07-30 ENCOUNTER — Telehealth (HOSPITAL_COMMUNITY): Payer: Self-pay | Admitting: *Deleted

## 2019-07-30 NOTE — Telephone Encounter (Signed)
Called patient with Spanish interpreter Alicia Miller to let her know that her Pap smear was normal and HPV negative. Told patient her next Pap smear is due in one year due to her history of abnormal Pap smears. Patient verbalized understanding.

## 2019-08-02 ENCOUNTER — Encounter: Payer: Self-pay | Admitting: *Deleted

## 2019-08-03 ENCOUNTER — Telehealth: Payer: Self-pay

## 2019-08-03 NOTE — Telephone Encounter (Signed)
[place] interpreters Alicia Miller  Id# [phone]  contacted pt to go over lab results pt is aware of results and doesn't have any questions or concerns

## 2019-08-14 ENCOUNTER — Ambulatory Visit (HOSPITAL_COMMUNITY)
Admission: EM | Admit: 2019-08-14 | Discharge: 2019-08-14 | Disposition: A | Discharge status: Home / Self Care | Payer: No Typology Code available for payment source | Attending: Urgent Care | Admitting: Urgent Care

## 2019-08-14 ENCOUNTER — Encounter (HOSPITAL_COMMUNITY): Payer: Self-pay

## 2019-08-14 ENCOUNTER — Other Ambulatory Visit: Payer: Self-pay

## 2019-08-14 DIAGNOSIS — R42 Dizziness and giddiness: Secondary | ICD-10-CM

## 2019-08-14 DIAGNOSIS — G44209 Tension-type headache, unspecified, not intractable: Secondary | ICD-10-CM

## 2019-08-14 DIAGNOSIS — R519 Headache, unspecified: Secondary | ICD-10-CM

## 2019-08-14 DIAGNOSIS — K0889 Other specified disorders of teeth and supporting structures: Secondary | ICD-10-CM

## 2019-08-14 MED ORDER — NAPROXEN 500 MG PO TABS: 500.0000 mg | ORAL_TABLET | Freq: Two times a day (BID) | ORAL | 0 refills | Status: DC

## 2019-08-14 NOTE — ED Triage Notes (Addendum)
Pt present Headache, symptoms started two weeks ago.Pain started in the back of head and move to the front of brain.  Pt states she is not sure of the headache is coming from the tooth that is infected but she is schedule to have a dental surgery this Wednesday.

## 2019-08-14 NOTE — ED Provider Notes (Signed)
MRN: [id number] DOB: 1976-01-29  Subjective:   Alicia Miller is a 43 y.o. female presenting for 2-day history of persistent headache over posterior head and neck.  Patient states that the headache is intermittent, lasts a few hours and has not taken any medications for relief.  She admits that she is under a lot of stress with a tooth that needs a root canal since February.  It is been very difficult for patient to get in with a dentist but has her surgery scheduled.  She has undergone a course of amoxicillin and was advised to take ibuprofen this past week but unfortunately this was associated with patient's headaches.  She has stopped taking ibuprofen but has not taken anything else.  She also states that she has not eaten very well has not been drinking water at all.  She has significant stressors with her family and kids, getting everything squared away for their school and also practicing social distancing.  She only sleeps about 5 to 6 hours per night, eats 2 meals per day at most.  Admits that she has gotten dizzy intermittently in the past couple of days during the day.  No current facility-administered medications for this encounter.   Current Outpatient Medications:  .  amoxicillin (AMOXIL) 500 MG capsule, Take 500 mg by mouth 3 (three) times daily., Disp: , Rfl:  .  ibuprofen (ADVIL) 800 MG tablet, Take 800 mg by mouth every 8 (eight) hours as needed. for pain, Disp: , Rfl:     Allergies  Allergen Reactions  . Bactrim [Sulfamethoxazole-Trimethoprim] Itching    Past Medical History:  Diagnosis Date  . 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 Cancer (HCC)    Cervical  . Depression   . Laryngospasm 12/10/2016  . Left-sided Bell's palsy 01/19/2016  . No pertinent past medical history   . Varicose veins of left leg with edema      Past Surgical History:  Procedure Laterality Date  .  CHOLECYSTECTOMY  05/2015  . ENDOVENOUS ABLATION SAPHENOUS VEIN W/ LASER Left 12/08/2017   endovenous laser ablation left greater saphenous vein and stab phlebectomy 10-20 incisions left leg by Tinnie Gens MD     Review of Systems  Constitutional: Negative for fever and malaise/fatigue.  HENT: Negative for congestion, ear pain, sinus pain and sore throat.   Eyes: Negative for blurred vision, double vision, discharge and redness.  Respiratory: Negative for cough, hemoptysis, shortness of breath and wheezing.   Cardiovascular: Negative for chest pain.  Gastrointestinal: Negative for abdominal pain, diarrhea, nausea and vomiting.  Genitourinary: Negative for dysuria, flank pain and hematuria.  Musculoskeletal: Negative for myalgias.  Skin: Negative for rash.  Neurological: Positive for dizziness and headaches. Negative for weakness.  Psychiatric/Behavioral: Negative for depression and substance abuse.    Objective:   Vitals: BP 128/81 (BP Location: Left Arm)   Pulse 82   Temp 98.3 F (36.8 C) (Oral)   Resp 16   LMP 08/02/2019   SpO2 99%   Physical Exam Constitutional:      General: She is not in acute distress.    Appearance: Normal appearance. She is well-developed. She is not ill-appearing, toxic-appearing or diaphoretic.  HENT:     Head: Normocephalic and atraumatic.     Right Ear: Tympanic membrane and ear canal normal. No drainage or tenderness. No middle ear effusion. Tympanic membrane is not erythematous.  Left Ear: Tympanic membrane and ear canal normal. No drainage or tenderness.  No middle ear effusion. Tympanic membrane is not erythematous.     Nose: Nose normal. No congestion or rhinorrhea.     Mouth/Throat:     Mouth: Mucous membranes are moist. No oral lesions.     Pharynx: Oropharynx is clear. No pharyngeal swelling, oropharyngeal exudate, posterior oropharyngeal erythema or uvula swelling.     Tonsils: No tonsillar exudate or tonsillar abscesses.  Eyes:      General: No scleral icterus.       Right eye: No discharge.        Left eye: No discharge.     Extraocular Movements: Extraocular movements intact.     Right eye: Normal extraocular motion.     Left eye: Normal extraocular motion.     Conjunctiva/sclera: Conjunctivae normal.     Pupils: Pupils are equal, round, and reactive to light.  Neck:     Musculoskeletal: Normal range of motion and neck supple.  Cardiovascular:     Rate and Rhythm: Normal rate and regular rhythm.     Pulses: Normal pulses.     Heart sounds: Normal heart sounds. No murmur. No friction rub. No gallop.   Pulmonary:     Effort: Pulmonary effort is normal. No respiratory distress.     Breath sounds: Normal breath sounds. No stridor. No wheezing, rhonchi or rales.  Musculoskeletal:     Cervical back: She exhibits normal range of motion, no tenderness, no bony tenderness, no swelling, no edema, no deformity and no spasm.  Lymphadenopathy:     Cervical: No cervical adenopathy.  Skin:    General: Skin is warm and dry.     Findings: No rash.  Neurological:     General: No focal deficit present.     Mental Status: She is alert and oriented to person, place, and time.     Cranial Nerves: No cranial nerve deficit.     Motor: No weakness.     Coordination: Coordination normal.     Gait: Gait normal.     Deep Tendon Reflexes: Reflexes normal.     Comments: Negative Romberg and pronator drift.  Psychiatric:        Mood and Affect: Mood normal.        Behavior: Behavior normal.        Thought Content: Thought content normal.        Judgment: Judgment normal.      Assessment and Plan :   1. Tension headache   2. Generalized headaches   3. Dizziness   4. Pain, dental     Vital signs and physical exam findings very reassuring.  Counseled patient on self-care including managing her stressors and eating regular meals, hydrating well every day.  We will have patient stop ibuprofen and switch to naproxen.  Otherwise  patient is to follow-up with her PCP, dentist. Counseled patient on potential for adverse effects with medications prescribed/recommended today, ER and return-to-clinic precautions discussed, patient verbalized understanding.    Jaynee Eagles, [place] 08/14/19 1656

## 2019-08-23 ENCOUNTER — Encounter: Payer: Self-pay | Admitting: Family Medicine

## 2019-08-25 ENCOUNTER — Other Ambulatory Visit: Payer: Self-pay

## 2019-08-25 ENCOUNTER — Ambulatory Visit: Payer: Self-pay | Attending: Family Medicine | Admitting: Family Medicine

## 2019-08-25 ENCOUNTER — Encounter: Payer: Self-pay | Admitting: Family Medicine

## 2019-08-25 VITALS — BP 120/75 | HR 76 | Temp 99.1°F | Resp 18 | Ht 62.0 in | Wt 148.0 lb

## 2019-08-25 DIAGNOSIS — Z758 Other problems related to medical facilities and other health care: Secondary | ICD-10-CM

## 2019-08-25 DIAGNOSIS — Z789 Other specified health status: Secondary | ICD-10-CM

## 2019-08-25 DIAGNOSIS — Z0000 Encounter for general adult medical examination without abnormal findings: Secondary | ICD-10-CM

## 2019-08-25 DIAGNOSIS — E7800 Pure hypercholesterolemia, unspecified: Secondary | ICD-10-CM

## 2019-08-25 NOTE — Progress Notes (Addendum)
Established Patient Office Visit  Subjective:  Patient ID: Alicia Miller, female    DOB: 1976-07-22  Age: 43 y.o. MRN: [id number]  CC: No chief complaint on file.   HPI Alicia Miller presents for annual well exam.  Patient with history of adenocarcinoma/abnormal Pap smear.  Patient has had normal Pap smear done on 07/20/2019 per GYN. Patient has had normal mammogram earlier thi year also on review of chart.         Patient is predominantly Spanish-speaking.  Str[employer] video interpretation system was not operating properly at her visit today.  She was able to communicate that she would like to have blood work including blood counts and lipids.  She denied any current problems-no chest pain, no headache, no abdominal pain, no shortness of breath or cough.  Past Medical History:  Diagnosis Date  . 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 Cancer (HCC)    Cervical  . Depression   . Laryngospasm 12/10/2016  . Left-sided Bell's palsy 01/19/2016  . No pertinent past medical history   . Varicose veins of left leg with edema     Past Surgical History:  Procedure Laterality Date  . CHOLECYSTECTOMY  05/2015  . ENDOVENOUS ABLATION SAPHENOUS VEIN W/ LASER Left 12/08/2017   endovenous laser ablation left greater saphenous vein and stab phlebectomy 10-20 incisions left leg by Tinnie Gens MD     Family History  Problem Relation Age of Onset  . Diabetes Father   . Cancer Sister        uterine  . Diabetes Sister   . Diabetes Paternal Grandmother   . Diabetes Paternal Uncle   . Diabetes Paternal Uncle   . Diabetes Paternal Uncle   . Diabetes Paternal Uncle   . Diabetes Paternal Uncle   . Diabetes Paternal Uncle     Social History   Socioeconomic History  . Marital status: Single    Spouse name: Not on file  . Number of children: 2  . Years of education: Not on file  . Highest  education level: 6th grade  Occupational History  . Not on file  Social Needs  . Financial resource strain: Not on file  . Food insecurity    Worry: Not on file    Inability: Not on file  . Transportation needs    Medical: No    Non-medical: No  Tobacco Use  . Smoking status: Never Smoker  . Smokeless tobacco: Never Used  Substance and Sexual Activity  . Alcohol use: No  . Drug use: No  . Sexual activity: Yes  Lifestyle  . Physical activity    Days per week: Not on file    Minutes per session: Not on file  . Stress: Not on file  Relationships  . Social Herbalist on phone: Not on file    Gets together: Not on file    Attends religious service: Not on file    Active member of club or organization: Not on file    Attends meetings of clubs or organizations: Not on file    Relationship status: Not on file  . Intimate partner violence    Fear of current or ex partner: Not on file    Emotionally abused: Not on file    Physically abused: Not on file    Forced sexual activity: Not on file  Other  Topics Concern  . Not on file  Social History Narrative  . Not on file    Outpatient Medications Prior to Visit  Medication Sig Dispense Refill  . amoxicillin (AMOXIL) 500 MG capsule Take 500 mg by mouth 3 (three) times daily.    Marland Kitchen ibuprofen (ADVIL) 800 MG tablet Take 800 mg by mouth every 8 (eight) hours as needed. for pain    . naproxen (NAPROSYN) 500 MG tablet Take 1 tablet (500 mg total) by mouth 2 (two) times daily. 30 tablet 0   No facility-administered medications prior to visit.     Allergies  Allergen Reactions  . Bactrim [Sulfamethoxazole-Trimethoprim] Itching    ROS Review of Systems  Reason unable to perform ROS: lack of video, audio or in-person translator for today's visit.  Respiratory: Negative for cough and shortness of breath.   Cardiovascular: Negative for chest pain.  Gastrointestinal: Negative for abdominal pain.  Neurological: Negative for  headaches.  Due to language barrier, only a few questions could be answered by patient    Objective:    Physical Exam  Constitutional: She is oriented to person, place, and time. She appears well-developed and well-nourished.  Neck: Normal range of motion. Neck supple.  Cardiovascular: Normal rate and regular rhythm.  Pulmonary/Chest: Effort normal and breath sounds normal.  Abdominal: Soft. There is no abdominal tenderness. There is no rebound and no guarding.  Musculoskeletal:        General: No edema.  Lymphadenopathy:    She has no cervical adenopathy.  Neurological: She is alert and oriented to person, place, and time.  Skin: Skin is warm and dry.  Psychiatric: She has a normal mood and affect. Her behavior is normal.  Nursing note and vitals reviewed.   LMP 08/02/2019  Wt Readings from Last 3 Encounters:  07/28/19 149 lb (67.6 kg)  07/20/19 151 lb (68.5 kg)  01/21/19 142 lb 9.6 oz (64.7 kg)     There are no preventive care reminders to display for this patient.    Lab Results  Component Value Date   TSH 3.690 07/28/2019   Lab Results  Component Value Date   WBC 7.2 07/31/2018   HGB 14.4 07/31/2018   HCT 43.3 07/31/2018   MCV 85 07/31/2018   PLT 364 07/31/2018   Lab Results  Component Value Date   NA 137 07/28/2019   K 4.4 07/28/2019   CO2 21 07/28/2019   GLUCOSE 97 07/28/2019   BUN 12 07/28/2019   CREATININE 0.73 07/28/2019   BILITOT 0.3 07/28/2019   ALKPHOS 53 07/28/2019   AST 21 07/28/2019   ALT 24 07/28/2019   PROT 7.3 07/28/2019   ALBUMIN 4.7 07/28/2019   CALCIUM 9.4 07/28/2019   Lab Results  Component Value Date   CHOL 227 (H) 07/31/2018   Lab Results  Component Value Date   HDL 50 07/31/2018   Lab Results  Component Value Date   LDLCALC 158 (H) 07/31/2018   Lab Results  Component Value Date   TRIG 95 07/31/2018   Lab Results  Component Value Date   CHOLHDL 4.5 (H) 07/31/2018   Lab Results  Component Value Date   HGBA1C  5.5 07/31/2018      Assessment & Plan:  1. Well adult exam; 2.  Hyperlipidemia/hypercholesterolemia Patient will have lipid panel and complete blood count done at today's visit per patient request.  Lipid panel is not actually screening as patient is known to have hyperlipidemia from lipid panel done 07/31/2018 with LDL  of 158.  She is encouraged to continue a healthy, low-fat diet.  Patient has had normal Pap smear done by GYN earlier this year.  Patient with normal mammogram on 07/20/2019.  She will receive educational material on preventative health care for her age group as part of after visit summary. - Lipid panel - CBC with Differential  3.  Language barrier affecting health care Patient is primarily Spanish-speaking and [employer] video interpretation system was not working at today's visit and failed to connect to interpreter and when system would connect then it would shortly drop again before any interpretation could be done.  So I did perform a exam and patient was able to express some of the things that she would like to have done such as her lipid panel and CBC, I did not feel that this constituted an adequate encounter due to the language barrier and no ability to provide interpretation services for the patient.  An After Visit Summary was printed and given to the patient.  Follow-up: Return for no charge- please reschedule with in-person interpreter as stratus not working.    Antony Blackbird, MD

## 2019-08-25 NOTE — Patient Instructions (Signed)
Cuidados preventivos en las mujeres de 27 a 76 aos de edad Preventive Care 31-42 Years Old, Female Los cuidados preventivos hacen referencia a las opciones en cuanto a las visitas al mdico y al estilo de vida, las cuales pueden promover la salud y Musician. Esto puede comprender lo siguiente:  Un examen fsico anual. Esto tambin se puede llamar control de bienestar anual.  Visitas regulares al dentista y exmenes oculares.  Vacunas.  Estudios para Engineer, building services.  Opciones saludables de estilo de vida, como seguir una dieta saludable, hacer ejercicio regularmente, no usar drogas ni productos que contengan nicotina y tabaco, y limitar el consumo de alcohol. Qu puedo esperar para mi visita de cuidado preventivo? Examen fsico El mdico revisar lo siguiente:  IT consultant y [place]. Esto se puede usar para calcular el ndice de masa corporal ([place]), que indica si tiene un peso saludable.  Frecuencia cardaca y presin arterial.  Piel para detectar manchas anormales. Asesoramiento Su mdico puede preguntarle acerca de:  Consumo de tabaco, alcohol y drogas.  Su bienestar emocional.  El bienestar en el hogar y sus relaciones personales.  Su actividad sexual.  Sus hbitos de alimentacin.  Su trabajo y [place] laboral.  Mtodos anticonceptivos.  Su ciclo menstrual.  Sus antecedentes de Media planner. Qu vacunas necesito?  [place] antigripal  Se recomienda aplicarse esta vacuna todos los Rudy. Vacuna contra el ttanos, difteria y tos ferina (Tdap)  Es posible que tenga que aplicarse un refuerzo contra el ttanos y la difteria (DT) cada 10aos. Vacuna contra la varicela  Es posible que tenga que aplicrsela si no recibi esta vacuna. Vacuna contra el herpes zster (culebrilla)  Es posible que la necesite despus de los 52 aos de [place]. Vacuna contra el sarampin, rubola y paperas (SRP)  Es posible que necesite aplicarse al menos una dosis de la vacuna  SRP si naci despus de [phone]. Podra tambin necesitar una segunda dosis. Vacuna antineumoccica conjugada (PCV13)  Puede necesitar esta vacuna si tiene determinadas enfermedades y no se vacun anteriormente. [place] antineumoccica de polisacridos (PPSV23)  Quizs tenga que aplicarse una o dos dosis si fuma o si tiene determinadas afecciones. Edward Jolly antimeningoccica conjugada (MenACWY)  Puede necesitar esta vacuna si tiene determinadas afecciones. Vacuna contra la hepatitis A  Es posible que necesite esta vacuna si tiene ciertas afecciones o si viaja o trabaja en lugares en los que podra estar expuesta a la hepatitis A. Vacuna contra la hepatitis B  Es posible que necesite esta vacuna si tiene ciertas afecciones o si viaja o trabaja en lugares en los que podra estar expuesta a la hepatitis B. Vacuna antihaemophilus influenzae tipo B (Hib)  Puede necesitar esta vacuna si tiene determinadas afecciones. Vacuna contra el virus del papiloma humano (VPH)  Si el mdico se lo recomienda, Research scientist (physical sciences) tres dosis a lo largo de 6 meses. Puede recibir las vacunas en forma de dosis individuales o en forma de dos o ms vacunas juntas en la misma inyeccin (vacunas combinadas). Hable con su mdico [employer] y beneficios de las vacunas combinadas. Qu pruebas necesito? Anlisis de [employer] de lpidos y colesterol. Estos se pueden verificar cada 5 aos o, con ms frecuencia, si usted tiene ms de 42 aos de edad.  Anlisis de hepatitisC.  Anlisis de hepatitisB. Pruebas de deteccin  Pruebas de deteccin de cncer de pulmn. Es posible que se le realice esta prueba de deteccin a partir de los 2 aos de edad, si ha fumado durante 30 aos  un paquete diario y sigue fumando o dej el hbito en algn momento en los ltimos 15 aos.  Pruebas de deteccin de cncer colorrectal. Todos los adultos a partir de los 50 aos de edad y hasta los 75 aos de edad deben hacerse esta  prueba de deteccin. El mdico puede recomendarle las pruebas de deteccin a partir de los 45 aos de edad si corre un mayor riesgo. Le realizarn pruebas cada 1 a 10 aos, segn los resultados y el tipo de prueba de deteccin.  Pruebas de deteccin de la diabetes. Esto se realiza mediante un control del azcar en la sangre (glucosa) despus de no haber comido durante un periodo de tiempo (ayuno). Es posible que se le realice esta prueba cada 1 a 3 aos.  Mamografa. Se puede realizar cada 1 o 2 aos. Hable con su mdico sobre cundo debe comenzar a realizarse mamografas de manera regular. Esto depende de si tiene antecedentes familiares de cncer de mama o no.  Pruebas de deteccin de cncer relacionado con las mutaciones del BRCA. Es posible que se las deba realizar si tiene antecedentes de cncer de mama, de ovario, de trompas o peritoneal.  Examen plvico y prueba de Papanicolaou. Esto se puede realizar cada 3aos a partir de los 21aos de edad. A partir de los 30 aos, esto se puede realizar cada 5 aos si usted se realiza una prueba de Papanicolaou en combinacin con una prueba de deteccin del virus del papiloma humano (VPH). Otras pruebas  Anlisis de enfermedades de transmisin sexual (ETS).  Densitometra sea. Esto se realiza para detectar osteoporosis. Se le puede realizar este examen de deteccin si tiene un riesgo alto de tener osteoporosis. Siga estas instrucciones en su casa: Comida y bebida  Siga una dieta que incluya frutas frescas y verduras, cereales integrales, lcteos descremados y protenas magras.  Tome los suplementos vitamnicos y minerales como se lo haya indicado el mdico.  No beba alcohol si: ? Su mdico le indica no hacerlo. ? Est embarazada, puede estar embarazada o est tratando de quedar embarazada.  Si bebe alcohol: ? Limite la cantidad que consume de 0 a 1 medida por da. ? Est atenta a la cantidad de alcohol que hay en las bebidas que toma. En los  [place], una medida equivale a una botella de cerveza de 12oz (355ml), un vaso de vino de 5oz (148ml) o un vaso de una bebida alcohlica de alta graduacin de 1oz (44ml). Estilo de vida  Cudese los dientes y las encas a diario.  Mantngase activa. Haga al menos 30minutos de ejercicio 5o ms das de la semana.  No consuma ningn producto que contenga nicotina o tabaco, como cigarrillos, cigarrillos electrnicos y tabaco de mascar. Si necesita ayuda para dejar de fumar, consulte al mdico.  Si es sexualmente activa, practique sexo seguro. Use un condn u otra forma de mtodo anticonceptivo (anticonceptivos) a fin de evitar un embarazo e ITS (infecciones de transmisin sexual).  Si el mdico se lo indic, tome una dosis baja de aspirina diariamente a partir de los 50 aos de edad. Cundo volver?  Visite al mdico una vez al ao para una visita de control.  Pregntele al mdico con qu frecuencia debe realizarse un control de la vista y los dientes.  Mantenga su esquema de vacunacin al da. Esta informacin no tiene como fin reemplazar el consejo del mdico. Asegrese de hacerle al mdico cualquier pregunta que tenga. Document Released: 03/18/2017 Document Revised: 08/20/2018 Document Reviewed: 08/20/2018 Elsevier Patient   Education  2020 Elsevier Inc.  

## 2019-08-26 LAB — CBC WITH DIFFERENTIAL/PLATELET
Basophils Absolute: 0.1 x10E3/uL (ref 0.0–0.2)
Basos: 1 %
EOS (ABSOLUTE): 0.3 x10E3/uL (ref 0.0–0.4)
Eos: 4 %
Hematocrit: 42.9 % (ref 34.0–46.6)
Hemoglobin: 14.8 g/dL (ref 11.1–15.9)
Immature Grans (Abs): 0 x10E3/uL (ref 0.0–0.1)
Immature Granulocytes: 0 %
Lymphocytes Absolute: 2.2 x10E3/uL (ref 0.7–3.1)
Lymphs: 28 %
MCH: 28.7 pg (ref 26.6–33.0)
MCHC: 34.5 g/dL (ref 31.5–35.7)
MCV: 83 fL (ref 79–97)
Monocytes Absolute: 0.5 x10E3/uL (ref 0.1–0.9)
Monocytes: 6 %
Neutrophils Absolute: 4.7 x10E3/uL (ref 1.4–7.0)
Neutrophils: 61 %
Platelets: 385 x10E3/uL (ref 150–450)
RBC: 5.15 x10E6/uL (ref 3.77–5.28)
RDW: 12.7 % (ref 11.7–15.4)
WBC: 7.8 x10E3/uL (ref 3.4–10.8)

## 2019-08-26 LAB — LIPID PANEL
Chol/HDL Ratio: 5 ratio — ABNORMAL HIGH (ref 0.0–4.4)
Cholesterol, Total: 230 mg/dL — ABNORMAL HIGH (ref 100–199)
HDL: 46 mg/dL
LDL Chol Calc (NIH): 146 mg/dL — ABNORMAL HIGH (ref 0–99)
Triglycerides: 208 mg/dL — ABNORMAL HIGH (ref 0–149)
VLDL Cholesterol Cal: 38 mg/dL (ref 5–40)

## 2019-08-27 ENCOUNTER — Other Ambulatory Visit: Payer: Self-pay | Admitting: Family Medicine

## 2019-08-27 DIAGNOSIS — Z79899 Other long term (current) drug therapy: Secondary | ICD-10-CM

## 2019-08-27 DIAGNOSIS — E782 Mixed hyperlipidemia: Secondary | ICD-10-CM

## 2019-08-27 MED ORDER — ROSUVASTATIN CALCIUM 10 MG PO TABS: 10.0000 mg | ORAL_TABLET | Freq: Every day | ORAL | 3 refills | Status: DC

## 2019-08-27 MED FILL — ?ROSUVASTATIN CALCIUM 10 MG: 10 | 30 days supply | Qty: 30 | Fill #0

## 2019-08-27 NOTE — Progress Notes (Signed)
Patient ID: Alicia Miller, female   DOB: December 05, 1975, 43 y.o.   MRN: [id number]   Patient with recent lipid panel showing elevated total cholesterol, elevated triglycerides and elevated bad cholesterol.  Prescription for Crestor 10 mg will be sent to patient's pharmacy and patient will also be notified to make follow-up appointment in 6 weeks for lab visit after starting use of Crestor.  Patient will also be asked to follow a low-fat diet.

## 2019-09-06 ENCOUNTER — Ambulatory Visit (INDEPENDENT_AMBULATORY_CARE_PROVIDER_SITE_OTHER): Payer: Self-pay | Admitting: Family Medicine

## 2019-09-06 DIAGNOSIS — Z789 Other specified health status: Secondary | ICD-10-CM

## 2019-09-06 DIAGNOSIS — Z79899 Other long term (current) drug therapy: Secondary | ICD-10-CM

## 2019-09-06 DIAGNOSIS — E782 Mixed hyperlipidemia: Secondary | ICD-10-CM

## 2019-09-06 DIAGNOSIS — M542 Cervicalgia: Secondary | ICD-10-CM

## 2019-09-06 DIAGNOSIS — Z758 Other problems related to medical facilities and other health care: Secondary | ICD-10-CM

## 2019-09-06 DIAGNOSIS — Z603 Acculturation difficulty: Secondary | ICD-10-CM

## 2019-09-06 DIAGNOSIS — R519 Headache, unspecified: Secondary | ICD-10-CM

## 2019-09-06 MED ORDER — CYCLOBENZAPRINE HCL 5 MG PO TABS: ORAL_TABLET | ORAL | 2 refills | Status: DC

## 2019-09-06 MED ORDER — PREDNISONE 20 MG PO TABS: ORAL_TABLET | ORAL | 0 refills | Status: DC

## 2019-09-06 MED FILL — predniSONE 20 MG TABS: 20 | 8 days supply | Qty: 8 | Fill #0

## 2019-09-06 MED FILL — CYCLOBENZAPRINE 5 MG TABLET: 5 | 15 days supply | Qty: 30 | Fill #0

## 2019-09-06 NOTE — Progress Notes (Signed)
Patient has c/o constant daily headaches located in the back of her head. States that she has a lot of pressure & nausea. No vomiting or vision changes.

## 2019-09-06 NOTE — Progress Notes (Signed)
Virtual Visit via Telephone Note  I connected with Alicia Miller on 09/06/19 at 10:30 AM EDT by telephone and verified that I am speaking with the correct person using two identifiers.   I discussed the limitations, risks, security and privacy concerns of performing an evaluation and management service by telephone and the availability of in person appointments. I also discussed with the patient that there may be a patient responsible charge related to this service. The patient expressed understanding and agreed to proceed.  Patient Location: Home Provider Location: [place] Others participating in call: [place] speaking interpreter through [facility] interpretation services.  Alicia Miller ID number [id number]   History of Present Illness:        43 year old female with complaint of recurrent, daily headache which she states starts at the back of her scalp on the right and sometimes radiates up her head but most often radiates into the upper back/shoulder area.  Pain is sharp and starts at a lower level and then feels up to about a 6 on a 0-to-10 scale.  Patient has not been taking any medications other than naproxen which does not seem to help with her pain.  Patient states that she was taking both ibuprofen and naproxen and she was told that she should not take both of these medications together.  She denies any current issues with stomach upset, no blood in the stool and no black stools.  She states that the headaches can last for hours and sometimes last all day.  She does not have any visual changes, no nausea, no sensitivity to light or noise during her headaches.  She has had a fall at home but she does not feel that this was related to her headaches and is not causing her headaches.  She states that she fell backwards and hit her left posterior head and was evaluated at urgent care.  Patient was asked if she had headaches before or after the fall.  Patient reports that headaches were not related  to the fall and headaches have only been going on about a month.  She states that the medication she was previously prescribed, naproxen has not helped.  She also states she was given a muscle relaxant previously but this made her too drowsy.  She denies any focal numbness or weakness, no tingling in the hand/fingertips.  No chest pain or palpitations, no shortness of breath or cough.  No current dizziness.  No recent fever or chills.  No peripheral edema.        She also has questions regarding her lipid panel from her recent annual well exam.  She did not understand what was meant by high triglycerides when she was given lab results.  She has obtained the medication that was prescribed to help with her cholesterol and has started the use of medication without problems.   Past Medical History:  Diagnosis Date  . 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 Cancer (HCC)    Cervical  . Depression   . Laryngospasm 12/10/2016  . Left-sided Bell's palsy 01/19/2016  . No pertinent past medical history   . Varicose veins of left leg with edema     Past Surgical History:  Procedure Laterality Date  . CHOLECYSTECTOMY  05/2015  . ENDOVENOUS ABLATION SAPHENOUS VEIN W/ LASER Left 12/08/2017   endovenous laser ablation left greater saphenous vein and stab phlebectomy  10-20 incisions left leg by Tinnie Gens MD     Family History  Problem Relation Age of Onset  . Diabetes Father   . Cancer Sister        uterine  . Diabetes Sister   . Diabetes Paternal Grandmother   . Diabetes Paternal Uncle   . Diabetes Paternal Uncle   . Diabetes Paternal Uncle   . Diabetes Paternal Uncle   . Diabetes Paternal Uncle   . Diabetes Paternal Uncle     Social History   Tobacco Use  . Smoking status: Never Smoker  . Smokeless tobacco: Never Used  Substance Use Topics  . Alcohol use: No  . Drug use: No     Allergies   Allergen Reactions  . Bactrim [Sulfamethoxazole-Trimethoprim] Itching       Observations/Objective: No vital signs or physical exam conducted as visit was done via telephone  Assessment and Plan: 1. Chronic daily headache Review of chart, patient was seen in the emergency department on 08/14/2019 and diagnosed with tension headaches.  She did report some anxiety and depression as well as some recurrent dental issues for which she had not yet been able to have taken care of which were contributing to her stress.  It was at that time that patient was told to switch from ibuprofen to naproxen.  No other medications prescribed.  Based on description, patient is likely having tension type headaches however she also reports having a fall at home.  Order placed for patient to have x-ray of the cervical spine to look for any acute abnormalities and/or spondylosis or degenerative disc disease which may be contributing to her headaches.  Prescription provided for cyclobenzaprine at 5 mg and patient may take 1 or 2 at bedtime as needed.  Patient also will be given a short course of prednisone to see if this helps with inflammation and headache/posterior neck pain.  Patient is being referred to neurology for further evaluation and treatment of her headaches. - Ambulatory referral to Neurology - cyclobenzaprine (FLEXERIL) 5 MG tablet; One or two at bedtime for headache/neck pain  Dispense: 30 tablet; Refill: 2 - predniSONE (DELTASONE) 20 MG tablet; 2 pills once daily for 2 days, then 1 pill daily for 2 days then 1/2 pill daily for 4 days; take after eating  Dispense: 8 tablet; Refill: 0 - DG Cervical Spine Complete; Future  2. Mixed hyperlipidemia Patient is status post recent annual well exam and had questions regarding her lipid results.  Discussed with the patient that her triglycerides and lipids were elevated and that this could lead to buildup of plaque within the arteries/atherosclerosis.  Patient has  started the rosuvastatin that was ordered and was reminded to schedule lab visit to check hepatic function panel/LFTs in about 6 weeks after starting the medication and new order for hepatic panel was placed - Hepatic Function Panel; Future  3. Long-term use of high-risk medication Patient has recently started use of rosuvastatin for hyperlipidemia and will have 6-week follow-up of medication use with lab visit for hepatic panel - Hepatic Function Panel; Future  4. Posterior neck pain Patient reports chronic daily headaches which start at the posterior neck/base of the scalp.  She also reports a fall at home but she felt that the fall occurred with discomfort to the left neck/back of the head which is now resolved.  Patient was asked to obtain an x-ray of the cervical spine to rule out any injury or other cause for her chronic neck/posterior scalp  pain and headaches.  Patient may use warm moist heat to the affected area, continue use of either ibuprofen or naproxen but not both as they are similar medicines and prescriptions were also provided for Flexeril at bedtime if needed as well as a short prednisone taper for inflammation. - cyclobenzaprine (FLEXERIL) 5 MG tablet; One or two at bedtime for headache/neck pain  Dispense: 30 tablet; Refill: 2 - predniSONE (DELTASONE) 20 MG tablet; 2 pills once daily for 2 days, then 1 pill daily for 2 days then 1/2 pill daily for 4 days; take after eating  Dispense: 8 tablet; Refill: 0 - DG Cervical Spine Complete; Future  5. Language barrier Due to a language barrier, Spanish-speaking interpreter was obtained through [facility] interpretation services to help with communication regarding patient's health issues and concerns.  Follow Up Instructions: Return in about 2 weeks (around 09/20/2019) for Headaches if not already seen by neurology and lab visit in 4 weeks.    I discussed the assessment and treatment plan with the patient. The patient was provided an  opportunity to ask questions and all were answered. The patient agreed with the plan and demonstrated an understanding of the instructions.   The patient was advised to call back or seek an in-person evaluation if the symptoms worsen or if the condition fails to improve as anticipated.  I provided 28 minutes of non-face-to-face time during this encounter.   Antony Blackbird, MD

## 2019-09-07 ENCOUNTER — Ambulatory Visit (HOSPITAL_COMMUNITY)
Admission: RE | Admit: 2019-09-07 | Discharge: 2019-09-07 | Disposition: A | Discharge status: Home / Self Care | Payer: No Typology Code available for payment source | Source: Ambulatory Visit | Attending: Family Medicine | Admitting: Family Medicine

## 2019-09-07 ENCOUNTER — Encounter: Payer: Self-pay | Admitting: Neurology

## 2019-09-07 ENCOUNTER — Other Ambulatory Visit: Payer: Self-pay | Admitting: Family Medicine

## 2019-09-07 ENCOUNTER — Other Ambulatory Visit: Payer: Self-pay

## 2019-09-07 ENCOUNTER — Telehealth: Payer: Self-pay | Admitting: Family Medicine

## 2019-09-07 DIAGNOSIS — M542 Cervicalgia: Secondary | ICD-10-CM | POA: Insufficient documentation

## 2019-09-07 DIAGNOSIS — R519 Headache, unspecified: Secondary | ICD-10-CM | POA: Insufficient documentation

## 2019-09-07 NOTE — Progress Notes (Signed)
Patient ID: Alicia Miller, female   DOB: 1976/01/24, 43 y.o.   MRN: [id number]   Patient with complaint of posterior neck headaches and radiation of pain into the shoulder.  Patient had recent cervical spine film which showed some disc space narrowing at C5-6 which may be the source of her pain.  She will be referred to orthopedics for further evaluation and has upcoming neurology evaluation of headaches and neck pain as well

## 2019-09-07 NOTE — Telephone Encounter (Signed)
Patient called wanting to get her lab results. Please follow up.

## 2019-09-08 NOTE — Telephone Encounter (Signed)
Results was given to patient and she verbalized understanding.

## 2019-09-23 ENCOUNTER — Ambulatory Visit: Payer: Self-pay

## 2019-09-23 ENCOUNTER — Ambulatory Visit (INDEPENDENT_AMBULATORY_CARE_PROVIDER_SITE_OTHER): Payer: Self-pay | Admitting: Surgery

## 2019-09-23 ENCOUNTER — Other Ambulatory Visit: Payer: Self-pay

## 2019-09-23 ENCOUNTER — Encounter: Payer: Self-pay | Admitting: Surgery

## 2019-09-23 DIAGNOSIS — R519 Headache, unspecified: Secondary | ICD-10-CM

## 2019-09-23 DIAGNOSIS — M542 Cervicalgia: Secondary | ICD-10-CM

## 2019-09-23 DIAGNOSIS — M4722 Other spondylosis with radiculopathy, cervical region: Secondary | ICD-10-CM

## 2019-09-23 MED ORDER — TRAMADOL HCL 50 MG PO TABS: 50.0000 mg | ORAL_TABLET | Freq: Two times a day (BID) | ORAL | 0 refills | Status: DC | PRN

## 2019-09-23 MED ORDER — METHOCARBAMOL 500 MG PO TABS: 500.0000 mg | ORAL_TABLET | Freq: Three times a day (TID) | ORAL | 0 refills | Status: DC | PRN

## 2019-09-23 MED ORDER — METHYLPREDNISOLONE 4 MG PO TABS: ORAL_TABLET | ORAL | 0 refills | Status: DC

## 2019-09-23 MED FILL — METHYLPREDNISOLONE 4 MG TBP: 4 | 7 days supply | Qty: 21 | Fill #0

## 2019-09-23 MED FILL — traMADol HCL 50 MG TABS: 50 | 15 days supply | Qty: 30 | Fill #0

## 2019-09-23 MED FILL — METHOCARBAMOL 500 MG TABS: 500 | 10 days supply | Qty: 30 | Fill #0

## 2019-09-23 NOTE — Progress Notes (Signed)
Office Visit Note   Patient: Alicia Miller           Date of Birth: 1976-10-12           MRN: [id number] Visit Date: 09/23/2019              Requested by: Antony Blackbird, MD [place],  [place] [id number] PCP: Antony Blackbird, MD   Assessment & Plan: Visit Diagnoses:  1. Neck pain   2. Other spondylosis with radiculopathy, cervical region   3. Generalized headaches     Plan: I reviewed x-rays with patient today.  I will have her discontinue the Flexeril prescribed by primary care physician and will hold off on taking that prednisone taper..  I did give patient prescriptions for Medrol 4 mg 6-day taper to be taken as directed, Robaxin 500 mg p.o. every 8 hours as needed for spasms and Ultram.  I recommend that she keep appointment with [facility] neurology as scheduled November 17.  With the chronic upper extremity neuropathy that she describes having for several years she may be having a double crush problem from carpal tunnel syndrome and possible right cubital tunnel.  We will see what Dr. Tomi Likens thinks about possibly doing NCV/EMG studies bilateral upper extremities.  Patient will follow-up with me in 3 weeks for recheck and if she has not had any improvement I may consider getting a cervical MRI to rule out HNP/stenosis.  All questions answered with the help of interpreter that is present.  Follow-Up Instructions: Return in about 3 weeks (around 10/14/2019) for with [name].   Orders:  Orders Placed This Encounter  Procedures  . XR Cervical Spine 2 or 3 views   Meds ordered this encounter  Medications  . methylPREDNISolone (MEDROL) 4 MG tablet    Sig: 6-day taper to be taken as directed.  (Patient will not take previous prednisone prescription given by her primary care physician Dr. Chapman Fitch)    Dispense:  21 tablet    Refill:  0  . traMADol (ULTRAM) 50 MG tablet    Sig: Take 1 tablet (50 mg total) by mouth every 12 (twelve) hours as needed.    Dispense:  30 tablet    Refill:  0  . methocarbamol (ROBAXIN) 500 MG tablet    Sig: Take 1 tablet (500 mg total) by mouth every 8 (eight) hours as needed for muscle spasms.    Dispense:  30 tablet    Refill:  0    Patient instructed to discontinue Flexeril      Procedures: No procedures performed   Clinical Data: No additional findings.   Subjective: Chief Complaint  Patient presents with  . Neck - Pain    HPI 44 year old Spanish female comes in with interpreter today and has been seen at request of primary care physician.  Patient comes in with complaints of worsening neck pain, and right arm pain that currently radiates to the shoulder.  Previously it did go down to her right hand.  No radicular pain left upper extremity.  States that this is been going on for about a month or so.  No recent injury.  Patient states that she has had problems with off-and-on headaches for several years but headaches have also been worse over the last couple of months.  She was seen in the emergency room August 14, 2019 and diagnosed with tension headaches.  Patient states that she has an appointment scheduled with a neurologist Dr. Tomi Likens October 05, 2019  for evaluation of her headaches. Patient admits to having intermittent numbness and tingling in both hands.  Right worse than left.  She notices this more at night when she is sleeping.  Occasionally she does wake up in the middle the night with feeling of her hands having fallen asleep.  States this has been going on for several years.  Primary care physician prescribed prednisone taper and Flexeril.  Patient states that she did not start the prednisone taper.  Has been using the Flexeril at night which is helped some of her spasm.  Patient did have cervical spine x-ray performed September 07, 2019 and that report showed:  CLINICAL DATA:  Cervicalgia and headache  EXAM: CERVICAL SPINE - COMPLETE 4+ VIEW  COMPARISON:  None.  FINDINGS: Frontal, lateral, open-mouth  odontoid, and bilateral oblique views were obtained. There is no fracture or spondylolisthesis. Prevertebral soft tissues and predental space regions are normal. There is disc space narrowing at C5-6, mild. Other disc spaces appear unremarkable. There is no appreciable exit foraminal narrowing on the oblique views.  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  Lung apices are clear.  IMPRESSION: Mild disc space narrowing at C5-6. Other disc spaces appear unremarkable. No appreciable facet arthropathy. No fracture or spondylolisthesis.  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   Electronically Signed   By: Lowella Grip III M.D.   On: 09/07/2019 15:51     . Review of Systems No current cardiac pulmonary GI GU issues  Objective: Vital Signs: LMP 08/29/2019   Physical Exam HENT:     Head: Normocephalic and atraumatic.  Eyes:     Extraocular Movements: Extraocular movements intact.     Pupils: Pupils are equal, round, and reactive to light.  Pulmonary:     Effort: No respiratory distress.  Musculoskeletal:     Comments: Cervical spine patient has some limitation in range of motion due to discomfort.  Positive right Spurling test.  Patient does have relief of her neck pain and shoulder pain with cervical distraction.  Moderate to marked right greater than left brachial plexus, trapezius and scapular tenderness.  Bilateral shoulders unremarkable.  Bilateral elbows good range of motion.  Positive Tinel's over the right cubital tunnel.  Negative on the left side.  Bilateral wrist good range of motion.  Positive Tinel's over the right carpal tunnel.  Negative on the left.  No focal motor deficits.  Skin:    General: Skin is warm and dry.  Neurological:     General: No focal deficit present.     Mental Status: She is alert and oriented to person, place, and time.  Psychiatric:        Mood and Affect: Mood normal.     Ortho Exam  Specialty Comments:  No specialty comments  available.  Imaging: No results found.   PMFS History: Patient Active Problem List   Diagnosis Date Noted  . Mood changes 07/28/2019  . Dyspareunia in female 07/28/2019  . Menorrhagia with regular cycle 07/28/2019  . S/P cholecystectomy 07/28/2019  . Nevus, non-neoplastic 08/06/2017  . Dermatofibroma 08/06/2017  . Seborrheic keratoses 06/12/2017  . 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 Pruritic erythematous rash 06/12/2017  . Varicose veins of left lower extremity with complications [id number]  . Vitamin D insufficiency 08/16/2016  . Joint laxity of right knee 08/15/2016  . Chronic pain of left ankle 08/15/2016  . Chronic pain of right ankle 08/15/2016  . Fatigue 08/15/2016  . Stress incontinence, female  02/12/2016  . Breast tenderness in female 02/12/2016  . Allergic rhinitis 08/11/2015  . Snoring 08/11/2015  . Laryngopharyngeal reflux 08/11/2015   Past Medical History:  Diagnosis Date  . 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 Cancer (HCC)    Cervical  . Depression   . Laryngospasm 12/10/2016  . Left-sided Bell's palsy 01/19/2016  . No pertinent past medical history   . Varicose veins of left leg with edema     Family History  Problem Relation Age of Onset  . Diabetes Father   . Cancer Sister        uterine  . Diabetes Sister   . Diabetes Paternal Grandmother   . Diabetes Paternal Uncle   . Diabetes Paternal Uncle   . Diabetes Paternal Uncle   . Diabetes Paternal Uncle   . Diabetes Paternal Uncle   . Diabetes Paternal Uncle     Past Surgical History:  Procedure Laterality Date  . CHOLECYSTECTOMY  05/2015  . ENDOVENOUS ABLATION SAPHENOUS VEIN W/ LASER Left 12/08/2017   endovenous laser ablation left greater saphenous vein and stab phlebectomy 10-20 incisions left leg by Tinnie Gens MD    Social History   Occupational History  . Not on file  Tobacco Use  .  Smoking status: Never Smoker  . Smokeless tobacco: Never Used  Substance and Sexual Activity  . Alcohol use: No  . Drug use: No  . Sexual activity: Yes

## 2019-09-27 ENCOUNTER — Telehealth: Payer: Self-pay | Admitting: Surgery

## 2019-09-27 NOTE — Telephone Encounter (Signed)
Error

## 2019-10-01 ENCOUNTER — Ambulatory Visit: Payer: No Typology Code available for payment source | Admitting: Family Medicine

## 2019-10-03 NOTE — Progress Notes (Signed)
NEUROLOGY CONSULTATION NOTE  Shrinika Popick MRN: [id number] DOB: [birth year]  Referring provider: Antony Blackbird, MD Primary care provider: Antony Blackbird, MD  Reason for consult:  headaches  HISTORY OF PRESENT ILLNESS: Alicia Miller is a 43 year old female with depression and history of left-sided Bell's palsy and cervical cancer who presents for headaches.  History supplemented by orthopedic and referring provider notes.   She has had headaches when she was in her 29s which were thought to be migraines, aggravated by sunlight, but they resolved.  She was evaluated in the ED on 08/14/2019 for 3 days of new worsening headaches where she was diagnosed with tension-type headache.  It was a severe pressure like right occipital pain associated with hot sensation lasting an hour off and on daily.    Over the past couple of months, she also reports worsening right sided posterior neck pain with radiation into the right shoulder with numbness and tingling in the hands, right worse than left.  Endorses paresthesias in the fifth digits bilaterally.  For several years, she has had intermittent numbness and tingling in both hands, right worse than left, that is worse at night when laying in bed but this was different.  Cervical spine X-ray from 09/07/2019 personally reviewed and showed mild disc space narrowing at C5-6 and cervical rib on left but otherwise unremarkable.  She was evaluated by orthopedic surgery on 09/23/2019 where exam demonstrated positive right Spurling test and positive Tinel's sign at over the right cubital tunnel and right carpal tunnel.  She was prescribed prednisone taper and Flexeril.  Headaches have improved but she still has residual pressure in back of head into the right sided of her neck.  She also reports that the bottom of her feet feel "hot" as well.  A couple of years ago, she did fall and hurt her neck.  Past muscle relaxants:  Flexeril (fatigue); Robaxin     PAST MEDICAL HISTORY: Past Medical History:  Diagnosis Date  . 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 Cancer (HCC)    Cervical  . Depression   . Laryngospasm 12/10/2016  . Left-sided Bell's palsy 01/19/2016  . No pertinent past medical history   . Varicose veins of left leg with edema     PAST SURGICAL HISTORY: Past Surgical History:  Procedure Laterality Date  . CHOLECYSTECTOMY  05/2015  . ENDOVENOUS ABLATION SAPHENOUS VEIN W/ LASER Left 12/08/2017   endovenous laser ablation left greater saphenous vein and stab phlebectomy 10-20 incisions left leg by Tinnie Gens MD     MEDICATIONS: Current Outpatient Medications on File Prior to Visit  Medication Sig Dispense Refill  . cyclobenzaprine (FLEXERIL) 5 MG tablet One or two at bedtime for headache/neck pain 30 tablet 2  . methocarbamol (ROBAXIN) 500 MG tablet Take 1 tablet (500 mg total) by mouth every 8 (eight) hours as needed for muscle spasms. 30 tablet 0  . methylPREDNISolone (MEDROL) 4 MG tablet 6-day taper to be taken as directed.  (Patient will not take previous prednisone prescription given by her primary care physician Dr. Chapman Fitch) 21 tablet 0  . naproxen (NAPROSYN) 500 MG tablet Take 1 tablet (500 mg total) by mouth 2 (two) times daily. 30 tablet 0  . predniSONE (DELTASONE) 20 MG tablet 2 pills once daily for 2 days, then 1 pill daily for 2 days then 1/2 pill daily for 4 days;  take after eating 8 tablet 0  . rosuvastatin (CRESTOR) 10 MG tablet Take 1 tablet (10 mg total) by mouth daily. To lower cholesterol 90 tablet 3  . traMADol (ULTRAM) 50 MG tablet Take 1 tablet (50 mg total) by mouth every 12 (twelve) hours as needed. 30 tablet 0   No current facility-administered medications on file prior to visit.     ALLERGIES: Allergies  Allergen Reactions  . Bactrim [Sulfamethoxazole-Trimethoprim] Itching    FAMILY HISTORY: Family  History  Problem Relation Age of Onset  . Diabetes Father   . Cancer Sister        uterine  . Diabetes Sister   . Diabetes Paternal Grandmother   . Diabetes Paternal Uncle   . Diabetes Paternal Uncle   . Diabetes Paternal Uncle   . Diabetes Paternal Uncle   . Diabetes Paternal Uncle   . Diabetes Paternal Uncle    SOCIAL HISTORY: Social History   Socioeconomic History  . Marital status: Single    Spouse name: Not on file  . Number of children: 2  . Years of education: Not on file  . Highest education level: 6th grade  Occupational History  . Not on file  Social Needs  . Financial resource strain: Not on file  . Food insecurity    Worry: Not on file    Inability: Not on file  . Transportation needs    Medical: No    Non-medical: No  Tobacco Use  . Smoking status: Never Smoker  . Smokeless tobacco: Never Used  Substance and Sexual Activity  . Alcohol use: No  . Drug use: No  . Sexual activity: Yes  Lifestyle  . Physical activity    Days per week: Not on file    Minutes per session: Not on file  . Stress: Not on file  Relationships  . Social Herbalist on phone: Not on file    Gets together: Not on file    Attends religious service: Not on file    Active member of club or organization: Not on file    Attends meetings of clubs or organizations: Not on file    Relationship status: Not on file  . Intimate partner violence    Fear of current or ex partner: Not on file    Emotionally abused: Not on file    Physically abused: Not on file    Forced sexual activity: Not on file  Other Topics Concern  . Not on file  Social History Narrative  . Not on file    REVIEW OF SYSTEMS: Constitutional: No fevers, chills, or sweats, no generalized fatigue, change in appetite Eyes: No visual changes, double vision, eye pain Ear, nose and throat: No hearing loss, ear pain, nasal congestion, sore throat Cardiovascular: No chest pain, palpitations Respiratory:  No  shortness of breath at rest or with exertion, wheezes GastrointestinaI: No nausea, vomiting, diarrhea, abdominal pain, fecal incontinence Genitourinary:  No dysuria, urinary retention or frequency Musculoskeletal:  Neck pain Integumentary: No rash, pruritus, skin lesions Neurological: as above Psychiatric: No depression, insomnia, anxiety Endocrine: No palpitations, fatigue, diaphoresis, mood swings, change in appetite, change in weight, increased thirst Hematologic/Lymphatic:  No purpura, petechiae. Allergic/Immunologic: no itchy/runny eyes, nasal congestion, recent allergic reactions, rashes  PHYSICAL EXAM: Blood pressure 104/70, pulse 76, height 5\' 2"  (1.575 m), weight 145 lb 12.8 oz (66.1 kg), SpO2 98 %. General: No acute distress.  Patient appears well-groomed.   Head:  Normocephalic/atraumatic Eyes:  fundi  examined but not visualized Neck: supple, no paraspinal tenderness, full range of motion Back: No paraspinal tenderness Heart: regular rate and rhythm Lungs: Clear to auscultation bilaterally. Vascular: No carotid bruits. Neurological Exam: Mental status: alert and oriented to person, place, and time, recent and remote memory intact, fund of knowledge intact, attention and concentration intact, speech fluent and not dysarthric, language intact. Cranial nerves: CN I: not tested CN II: pupils equal, round and reactive to light, visual fields intact CN III, IV, VI:  full range of motion, no nystagmus, no ptosis CN V: facial sensation intact CN VII: upper and lower face symmetric CN VIII: hearing intact CN IX, X: gag intact, uvula midline CN XI: sternocleidomastoid and trapezius muscles intact CN XII: tongue midline Bulk & Tone: normal, no fasciculations. Motor:  5/5 throughout  Sensation:  Pinprick sensation mildly reduced in fingers; and vibration sensation intact.   Deep Tendon Reflexes:  2+ throughout, toes downgoing.   Finger to nose testing:  Without dysmetria.   Heel  to shin:  Without dysmetria.   Gait:  Normal station and stride.  Romberg negative.  IMPRESSION: 1.  Right sided occipital headache with dysesthesias, occipital neuralgia vs upper cervical radiculopathy given associated neck pain. 2.  She endorses that her feet feel hot as well.  TSH normal.  Will check for B12 deficiency. 3.  Numbness and tingling in hands may be carpal tunnel and cubital tunnel syndrome (she particularly notes paresthesias along ulnar distribution of hands)  PLAN: 1.  For neck pain, will refer to Dr. Hulan Saas of [place] as she would rather avoid medication if possible. 2.  Will check NCV-EMG right upper and lower extremities and B12 level 3.  Follow up in 4 months  Thank you for allowing me to take part in the care of this patient.  Metta Clines, DO  CC: Antony Blackbird, MD

## 2019-10-05 ENCOUNTER — Other Ambulatory Visit (INDEPENDENT_AMBULATORY_CARE_PROVIDER_SITE_OTHER): Payer: Self-pay

## 2019-10-05 ENCOUNTER — Encounter: Payer: Self-pay | Admitting: Neurology

## 2019-10-05 ENCOUNTER — Other Ambulatory Visit: Payer: Self-pay

## 2019-10-05 ENCOUNTER — Ambulatory Visit (INDEPENDENT_AMBULATORY_CARE_PROVIDER_SITE_OTHER): Payer: Self-pay | Admitting: Neurology

## 2019-10-05 VITALS — BP 104/70 | HR 76 | Ht 62.0 in | Wt 145.8 lb

## 2019-10-05 DIAGNOSIS — R200 Anesthesia of skin: Secondary | ICD-10-CM

## 2019-10-05 DIAGNOSIS — M542 Cervicalgia: Secondary | ICD-10-CM

## 2019-10-05 DIAGNOSIS — M5481 Occipital neuralgia: Secondary | ICD-10-CM

## 2019-10-05 DIAGNOSIS — R202 Paresthesia of skin: Secondary | ICD-10-CM

## 2019-10-05 LAB — VITAMIN B12: Vitamin B-12: 379 pg/mL (ref 211–911)

## 2019-10-05 NOTE — Patient Instructions (Addendum)
1.  I will refer you to Dr. Hulan Saas of [place] for treatment of neck pain 2.  We will check a B12 level 3.  We will check a nerve study (right arm and leg) 4.  Follow up in 4 months.  1. Lo referir al Dr. Hulan Saas de [facility] para el tratamiento del dolor de cuello. 2. Comprobaremos un nivel de B12 3. Comprobaremos un estudio de nervios (brazo y pierna derechos) 4. Seguimiento en 4 meses.

## 2019-10-06 ENCOUNTER — Telehealth: Payer: Self-pay

## 2019-10-06 NOTE — Telephone Encounter (Signed)
-----   Message from Pieter Partridge, DO sent at 10/06/2019  7:22 AM EST ----- B12 is normal

## 2019-10-06 NOTE — Telephone Encounter (Signed)
Patient informed of normal b12.

## 2019-10-11 ENCOUNTER — Other Ambulatory Visit: Payer: Self-pay

## 2019-10-11 ENCOUNTER — Ambulatory Visit: Payer: Self-pay | Attending: Family Medicine

## 2019-10-11 DIAGNOSIS — N920 Excessive and frequent menstruation with regular cycle: Secondary | ICD-10-CM

## 2019-10-11 DIAGNOSIS — N941 Unspecified dyspareunia: Secondary | ICD-10-CM

## 2019-10-11 DIAGNOSIS — R5383 Other fatigue: Secondary | ICD-10-CM

## 2019-10-11 DIAGNOSIS — Z79899 Other long term (current) drug therapy: Secondary | ICD-10-CM

## 2019-10-11 DIAGNOSIS — E782 Mixed hyperlipidemia: Secondary | ICD-10-CM

## 2019-10-12 ENCOUNTER — Encounter: Payer: Self-pay | Admitting: Family Medicine

## 2019-10-12 ENCOUNTER — Ambulatory Visit (INDEPENDENT_AMBULATORY_CARE_PROVIDER_SITE_OTHER): Payer: No Typology Code available for payment source | Admitting: Family Medicine

## 2019-10-12 VITALS — BP 118/64 | HR 79 | Temp 98.7°F | Ht 62.0 in | Wt 146.0 lb

## 2019-10-12 DIAGNOSIS — M542 Cervicalgia: Secondary | ICD-10-CM

## 2019-10-12 DIAGNOSIS — M5481 Occipital neuralgia: Secondary | ICD-10-CM

## 2019-10-12 DIAGNOSIS — R29898 Other symptoms and signs involving the musculoskeletal system: Secondary | ICD-10-CM

## 2019-10-12 LAB — CBC WITH DIFFERENTIAL/PLATELET
Basophils Absolute: 0.1 10*3/uL (ref 0.0–0.2)
Basos: 1 %
EOS (ABSOLUTE): 0.3 10*3/uL (ref 0.0–0.4)
Eos: 4 %
Hematocrit: 44.5 % (ref 34.0–46.6)
Hemoglobin: 14.7 g/dL (ref 11.1–15.9)
Immature Grans (Abs): 0 10*3/uL (ref 0.0–0.1)
Immature Granulocytes: 0 %
Lymphocytes Absolute: 2.2 10*3/uL (ref 0.7–3.1)
Lymphs: 26 %
MCH: 28 pg (ref 26.6–33.0)
MCHC: 33 g/dL (ref 31.5–35.7)
MCV: 85 fL (ref 79–97)
Monocytes Absolute: 0.5 10*3/uL (ref 0.1–0.9)
Monocytes: 5 %
Neutrophils Absolute: 5.4 10*3/uL (ref 1.4–7.0)
Neutrophils: 64 %
Platelets: 377 10*3/uL (ref 150–450)
RBC: 5.25 x10E6/uL (ref 3.77–5.28)
RDW: 12.7 % (ref 11.7–15.4)
WBC: 8.5 10*3/uL (ref 3.4–10.8)

## 2019-10-12 LAB — HEPATIC FUNCTION PANEL
ALT: 12 IU/L (ref 0–32)
AST: 11 IU/L (ref 0–40)
Albumin: 4.6 g/dL (ref 3.8–4.8)
Alkaline Phosphatase: 58 IU/L (ref 39–117)
Bilirubin Total: 0.3 mg/dL (ref 0.0–1.2)
Bilirubin, Direct: 0.08 mg/dL (ref 0.00–0.40)
Total Protein: 7 g/dL (ref 6.0–8.5)

## 2019-10-12 NOTE — Patient Instructions (Addendum)
Thank you for coming in today. Recheck in about 1 month at [facility].   Attend PT.    Neuralgia occipital Occipital Neuralgia  La neuralgia occipital es un tipo de dolor de cabeza que causa episodios breves de dolor muy intenso en la parte posterior de la cabeza. El dolor causado por la neuralgia occipital puede extenderse (irradiarse) a otras partes de la cabeza. Estos dolores de [place] pueden deberse a la irritacin de los nervios que emergen de la mdula espinal en la parte superior del cuello, justo por debajo de la base del crneo (nervios occipitales). Los nervios occipitales transmiten sensaciones desde la parte posterior y superior de la [place], y desde las zonas detrs de las [place]. Cules son las causas? Esta afeccin puede presentarse sin ninguna causa conocida (sndrome de cefalea primaria). En otros casos, esta afeccin se debe a la presin o a la irritacin de [employer] nervios occipitales. La compresin o la irritacin pueden deberse a lo siguiente:  Espasmos musculares en el cuello.  Lesin en el cuello.  Desgaste vertebral en el cuello (artrosis).  Enfermedad de los discos que separan las vrtebras.  Inflamacin de los vasos sanguneos que ejerce presin sobre los nervios occipitales.  Infecciones.  Tumores.  Diabetes. Cules son los signos o los sntomas? Esta afeccin causa un dolor ardiente, punzante, elctrico, estremecedor o agudo que es breve y puede expandirse a la parte superior de la cabeza. Puede suceder en un lado o a ambos lados de la cabeza. Tambin puede causarle:  Dolor detrs del ojo.  Dolor que se desencadena al mover el cuello o Photographer.  Sensibilidad en el cuero cabelludo.  Molestias en la parte posterior de la cabeza entre los episodios de dolor muy intensos.  Dolor que empeora con la exposicin a luces brillantes. Cmo se diagnostica? No hay ningn estudio para diagnosticar esta afeccin. El mdico puede diagnosticar  esta afeccin en base a un examen mdico y sus sntomas. Es posible que le realicen otros estudios, como los siguientes:  Estudios de diagnstico por imgenes del cerebro y del cuello (columna vertebral cervical), como una resonancia magntica (RM) o una exploracin por tomografa computarizada (TC). Estos buscan causas de nervios pellizcados.  Aplicar presin en los nervios del cuello para tratar de Museum/gallery exhibitions officer.  Inyeccin de un anestsico en las reas del nervio occipital para observar si el dolor desaparece (diagnstico por bloqueo de nervios). Cmo se trata? El tratamiento de esta afeccin puede comenzar con medidas sencillas, como las siguientes:  Reposo.  Masajes.  Aplicar fro o calor en el rea.  Analgsicos de [employer]. Si estas medidas no funcionan, es posible que necesite otros tratamientos, incluidos los siguientes:  Medicamentos, como los siguientes: ? Antiinflamatorios en concentraciones que requieren receta mdica. ? Relajantes musculares. ? Medicamentos anticonvulsivos, que Production manager. ? Antidepresivos, que Production manager. ? Medicamentos inyectados, como medicamentos que adormecen el rea (anestesia local) y corticoesteroides.  Ablacin por radiofrecuencia pulsada. En este procedimiento se implantan cables para emitir impulsos elctricos que inhiben las seales del dolor del nervio occipital.  Ciruga para aliviar la compresin nerviosa.  Fisioterapia. Siga estas indicaciones en su casa: Manejo del Restaurant manager, fast food las actividades que le causen dolor.  Haga reposo cuando tenga una crisis de Social research officer, government.  Intente hacerse masajes suaves para Best boy.  Intente colocar una almohada diferente o cambiar de posicin al dormir.  Si se lo indican, aplique calor en  la zona afectada como se lo haya indicado el mdico. Use la fuente de calor que el mdico le recomiende, como una compresa de calor hmedo o una almohadilla  trmica. ? Coloque una [employer] piel y la fuente de Freight forwarder. ? Aplique el calor durante 20 a 38minutos. ? Retire la fuente de calor si la piel se pone de color rojo brillante. Esto es muy importante si no puede Education officer, environmental, calor o fro. Puede correr un riesgo mayor de sufrir quemaduras.  Si se lo indican, aplique hielo en la parte posterior de la cabeza y en el rea del cuello como se lo haya indicado el mdico. ? Ponga el hielo en una bolsa plstica. ? Coloque una [employer] piel y la bolsa de hielo. ? Coloque el hielo durante 57minutos, de 2a3veces por da. Instrucciones generales  [employer] de venta libre y los recetados solamente como se lo haya indicado el mdico.  Evite los factores que empeoren los sntomas, como las luces brillantes.  Intente mantenerse activo. Haga ejercicios con regularidad que no le Agricultural engineer. Pdale al mdico que le sugiera ejercicios que sean seguros para usted.  Trabaje con un fisioterapeuta para aprender los ejercicios de elongacin que puede hacer en su casa.  Adopte una buena postura.  Concurra a todas las visitas de control como se lo haya indicado el mdico. Esto es importante. Comunquese con un mdico si:  El medicamento no le hace efecto.  Tiene sntomas nuevos o sus sntomas empeoran. Solicite ayuda de inmediato si:  Siente un dolor de cabeza muy intenso que no se [place].  Tiene cambios repentinos en la visin, el equilibrio o el habla. Resumen  La neuralgia occipital es un tipo de dolor de cabeza que causa episodios breves de dolor muy intenso en la parte posterior de la cabeza.  El dolor causado por la neuralgia occipital puede extenderse (irradiarse) a otras partes de la cabeza.  El tratamiento de esta afeccin incluye descanso, masajes y [place]. Esta informacin no tiene Marine scientist el consejo del mdico. Asegrese de hacerle al mdico cualquier pregunta que tenga. Document Released:  08/14/2005 Document Revised: 01/17/2018 Document Reviewed: 08/11/2017 Elsevier Patient Education  Morse tnel cubital Cubital Tunnel Syndrome  El sndrome del tnel cubital es una afeccin que causa dolor y debilidad en el antebrazo y la [place]. Se produce cuando se irrita uno de los nervios que est en la parte interior de la articulacin del codo (nervio cubital). Esta afeccin suele ser causada por el movimiento repetitivo del brazo en la prctica de deportes o en actividades relacionadas con Leander Rams. Cules son las causas? Esta afeccin puede ser causada por lo siguiente:  Aumento de la presin [employer] nervio cubital a la altura del codo, el brazo o Research officer, political party. Esto puede deberse a lo siguiente: ? Irritacin causada por doblar el codo repetidamente. ? Sunday Sh[place] de codo mal consolidadas. ? Tumores en el codo. Estos generalmente no son cancerosos (benignos). ? El desarrollo de tejido cicatricial en el codo despus de una lesin. ? Crecimientos de hueso (osteofitos) cerca del nervio cubital.  Estiramiento del nervio debido a laxitud ligamentaria del codo.  Traumatismo del [employer] zona del codo. Qu incrementa el riesgo? Los siguientes factores pueden hacer que sea ms propenso a contraer esta afeccin:  Optometrist tareas manuales que requieren doblar el codo frecuentemente.  Practicar deportes que impliquen hacer movimientos de lanzamiento repetitivos o extenuantes, como el bisbol.  Practicar deportes de contacto, como el ftbol americano o Editor, commissioning.  No hacer un calentamiento adecuado antes de las actividades.  Tener diabetes.  Tener baja actividad de la tiroides (hipotiroidismo). Cules son los signos o los sntomas? Los sntomas de esta afeccin [employer] siguientes:  Torpeza y debilidad en la mano.  Elevada sensibilidad en la parte interna del codo.  Dolor o Psychologist, clinical parte interna del codo, el antebrazo o los dedos,  especialmente en el meique o el anular.  Aumento del dolor al forzar el codo a doblarse.  Disminucin del control al lanzar objetos.  Hormigueo, entumecimiento o sensacin de Insurance account manager o en parte de la mano o los dedos, especialmente en el meique o el anular.  Dolores agudos que van desde el codo hacia la mueca y la [place].  Incapacidad para agarrar objetos o pellizcar con fuerza. Cmo se diagnostica? Esta afeccin se diagnostica en funcin de lo siguiente:  Los sntomas y antecedentes mdicos. El mdico tambin le preguntar por los detalles de cualquier lesin que haya tenido.  Un examen fsico. Tambin pueden hacerle exmenes, que incluyen los siguientes:  Una electromiograma (EMG). Esta prueba mide las seales elctricas que los nervios les envan a los msculos.  Estudio de [employer]. Este estudio permite determinar si las seales elctricas pasan correctamente por los nervios.  Estudios de diagnstico por imgenes, como radiografas, una ecografa y Public house manager (RM). Estos estudios Chiropodist las posibles causas de la afeccin. Cmo se trata? El tratamiento para esta afeccin puede incluir lo siguiente:  Dejar de Field seismologist las actividades que [employer] sntomas.  Aplicar hielo y tomar medicamentos para Best boy y reducir la hinchazn.  Usar una frula para evitar que el codo se doble o usar una codera en el lugar en que el nervio cubital est ms cerca de la piel.  Trabajar con un fisioterapeuta en los [employer] graves. Esto puede ayudar a: ? Disminuir los sntomas. ? Mejorar la fuerza y la amplitud de movimiento del codo, el antebrazo y Insurance risk surveyor. Si estos tratamientos no resultan eficaces, tal vez deba someterse a [place]. Siga estas indicaciones en su casa: Si tiene una frula:  Use la frula como se lo haya indicado el mdico. Qutesela solamente como se lo haya indicado el mdico.  Afloje la frula si los  dedos se le adormecen, siente hormigueo o se le enfran y se tornan de Optician, dispensing.  Mantenga la frula limpia.  Si la frula no es impermeable: ? No deje que se moje. ? Cbrala con un envoltorio hermtico cuando tome un bao de inmersin o [place]. Control del dolor, el entumecimiento y la hinchazn   Si se lo indican, aplique hielo sobre la zona de la lesin: ? Field seismologist hielo en una bolsa plstica. ? Coloque una [employer] piel y Therapist, nutritional. ? Coloque el hielo durante 55minutos, 2 a 3veces por da.  Mueva los dedos con frecuencia para evitar la rigidez y Armed forces technical officer hinchazn.  Cuando est sentado o acostado, alce (eleve) la zona de la lesin por encima del nivel del corazn. Indicaciones generales  [employer] de venta libre y los recetados solamente como se lo haya indicado el mdico.  Haga los ejercicios o la fisioterapia como se lo haya indicado el mdico.  No conduzca ni use maquinaria pesada mientras toma analgsicos recetados.  Si le indicaron que use una codera, sela como se lo haya indicado el mdico.  Concurra a todas las visitas de control como se lo haya indicado el mdico. Esto es importante. Comunquese con un mdico si:  Sus sntomas empeoran.  Los sntomas no mejoran con Dispensing optician.  Siente un dolor nuevo.  Siente fro o entumecimiento en la mano del lado lesionado. Resumen  El sndrome del tnel cubital es una afeccin que causa dolor y debilidad en el antebrazo y la [place].  Usted tiene ms probabilidades de presentar esta afeccin si realiza un trabajo o practica deportes que implican hacer movimientos repetitivos con el brazo.  Con frecuencia, el tratamiento para esta afeccin es dejar de [employer] actividades repetitivas, aplicar hielo y Risk manager medicamentos antiinflamatorios.  En raras ocasiones, podra necesitarse [place]. Esta informacin no tiene Marine scientist el consejo del mdico. Asegrese de hacerle al mdico  cualquier pregunta que tenga. Document Released: 08/21/2006 Document Revised: 04/28/2018 Document Reviewed: 04/28/2018 Elsevier Patient Education  2020 [employer].

## 2019-10-12 NOTE — Progress Notes (Signed)
Subjective:    I'm seeing this patient as a consultation for:  Dr Tomi Likens and Dr Chapman Fitch  CC: Neck pain, paresthesia and headache/occipital neuralgia  HPI:  Patient has been seen by her PCP in by orthopedic PA and by neurology for these issues.  Neck pain: Patient notes a several month history of right-sided posterior neck pain radiating referring to the right shoulder associated with numbness and tingling in the bilateral hands right worse than left.  X-ray in October did show some degenerative changes in her C-spine.  She has been treated with muscle relaxers and steroid Dosepak with some benefit.  She is referred to me to further manage neck pain after evaluation with neurology.  Paresthesias: Patient has bilateral upper extremity paresthesias predominantly involving the fifth digits.  She has had some evaluation already for this and is thought to have perhaps cubital and carpal tunnel syndrome versus cervical radiculopathy.  She was seen by neurology recently and is in the process of being scheduled for an EMG/nerve conduction study.  Headache: Seen by neurology for this and thought to be secondary to neck pain or occipital neuralgia.    Past medical history, Surgical history, Family history not pertinant except as noted below, Social history, Allergies, and medications have been entered into the medical record, reviewed, and no changes needed.   Review of Systems: No headache, visual changes, nausea, vomiting, diarrhea, constipation, dizziness, abdominal pain, skin rash, fevers, chills, night sweats, weight loss, swollen lymph nodes, body aches, joint swelling, muscle aches, chest pain, shortness of breath, mood changes, visual or auditory hallucinations.   Objective:    Vitals:   10/12/19 1037  BP: 118/64  Pulse: 79  Temp: 98.7 F (37.1 C)  SpO2: 97%   General: Well Developed, well nourished, and in no acute distress.  Neuro/Psych: Alert and oriented x3, extra-ocular muscles  intact, able to move all 4 extremities, sensation grossly intact. Skin: Warm and dry, no rashes noted.  Respiratory: Not using accessory muscles, speaking in full sentences, trachea midline.  Cardiovascular: Pulses palpable, no extremity edema. Abdomen: Does not appear distended. MSK:  C-spine: Normal-appearing Nontender to spinal midline.  Not particularly tender palpation paraspinal musculature. Normal cervical motion some pain with lateral flexion to the right. Upper extremity strength reflexes and sensation are equal and normal throughout.  Positive Tinel's right cubital tunnel and carpal tunnel.  Negative Tinel's left upper extremity.   Lab and Radiology Results   EXAM: CERVICAL SPINE - COMPLETE 4+ VIEW date of service September 07, 2019  COMPARISON:  None.  FINDINGS: Frontal, lateral, open-mouth odontoid, and bilateral oblique views were obtained. There is no fracture or spondylolisthesis. Prevertebral soft tissues and predental space regions are normal. There is disc space narrowing at C5-6, mild. Other disc spaces appear unremarkable. There is no appreciable exit foraminal narrowing on the oblique views.  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  Lung apices are clear.  IMPRESSION: Mild disc space narrowing at C5-6. Other disc spaces appear unremarkable. No appreciable facet arthropathy. No fracture or spondylolisthesis.  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   Electronically Signed   By: Lowella Grip III M.D.   On: 09/07/2019 15:51   I, Lynne Leader, personally (independently) visualized and performed the interpretation of the images attached in this note.  My personal independent interpretation of x-ray is significant for straightening and loss of normal cervical lordosis indicating spasm especially noted in the superior portion of the C-spine.  She does have  mild C5-6 disc space narrowing per my read as well.    Impression and Recommendations:     Assessment and Plan: 43 y.o. female with  Right posterior neck pain: Secondary to myofascial spasm and dysfunction also including occipital neuralgia.  Discussed treatment options.  At this point we will proceed with trial of physical therapy and recheck back in 1 month.  If no better patient may benefit from trial of occipital nerve block for pain benefit evaluation.  Upper extremity paresthesias.  Likely cubital tunnel and carpal tunnel syndrome on the right possibly somewhat on the left.  Cervical radiculopathy also possible but less likely.  Currently managed by neurology.  Agree with planned nerve conduction study/EMG.  This should be diagnostic.  This study is scheduled for December 15.  She will follow-up with me in about a month at that time should have results of EMG back though can review with patient.  Be happy to proceed with further treatment if needed including cubital tunnel or carpal tunnel injection in clinic..   Orders Placed This Encounter  Procedures  . Ambulatory referral to Physical Therapy    Referral Priority:   Routine    Referral Type:   Physical Medicine    Referral Reason:   Specialty Services Required    Requested Specialty:   Physical Therapy   No orders of the defined types were placed in this encounter.   Discussed warning signs or symptoms. Please see discharge instructions. Patient expresses understanding.  Visit conducted using a Spanish interpreter.

## 2019-10-13 ENCOUNTER — Telehealth: Payer: Self-pay | Admitting: *Deleted

## 2019-10-13 NOTE — Telephone Encounter (Signed)
-----   Message from Antony Blackbird, MD sent at 10/12/2019  4:08 PM EST ----- Normal liver enzymes

## 2019-10-13 NOTE — Telephone Encounter (Signed)
Patient verified DOB Patient is aware of liver being normal. Cholesterol is still elevated.

## 2019-10-20 ENCOUNTER — Other Ambulatory Visit: Payer: Self-pay

## 2019-10-20 ENCOUNTER — Ambulatory Visit: Payer: No Typology Code available for payment source | Admitting: Surgery

## 2019-10-20 DIAGNOSIS — Z20822 Contact with and (suspected) exposure to covid-19: Secondary | ICD-10-CM

## 2019-10-21 ENCOUNTER — Ambulatory Visit: Payer: No Typology Code available for payment source | Admitting: Surgery

## 2019-10-22 LAB — NOVEL CORONAVIRUS, NAA: SARS-CoV-2, NAA: NOT DETECTED

## 2019-10-26 ENCOUNTER — Other Ambulatory Visit: Payer: Self-pay

## 2019-10-26 ENCOUNTER — Ambulatory Visit: Payer: No Typology Code available for payment source | Attending: Family Medicine | Admitting: Physical Therapy

## 2019-10-26 ENCOUNTER — Encounter: Payer: Self-pay | Admitting: Physical Therapy

## 2019-10-26 DIAGNOSIS — M542 Cervicalgia: Secondary | ICD-10-CM

## 2019-10-26 DIAGNOSIS — R519 Headache, unspecified: Secondary | ICD-10-CM

## 2019-10-26 DIAGNOSIS — M25511 Pain in right shoulder: Secondary | ICD-10-CM | POA: Insufficient documentation

## 2019-10-26 NOTE — Therapy (Signed)
[place], [place], [address] Phone: [phone]   Fax:  [phone]  Physical Therapy Evaluation  Patient Details  Name: Alicia Miller MRN: [id number] Date of Birth: 1976-10-29 Referring Provider (PT): Lynne Leader, MD   Encounter Date: 10/26/2019  PT End of Session - 10/26/19 1035    Visit Number  1    Number of Visits  13    Date for PT Re-Evaluation  12/10/19    Authorization Type  CAFA 8/21-2/21/21    PT Start Time  1020   pt arrived late   PT Stop Time  1101    PT Time Calculation (min)  41 min    Activity Tolerance  Patient tolerated treatment well    Behavior During Therapy  [facility] for tasks assessed/performed       Past Medical History:  Diagnosis Date  . 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 Cancer (HCC)    Cervical  . Depression   . Laryngospasm 12/10/2016  . Left-sided Bell's palsy 01/19/2016  . No pertinent past medical history   . Varicose veins of left leg with edema     Past Surgical History:  Procedure Laterality Date  . CHOLECYSTECTOMY  05/2015  . ENDOVENOUS ABLATION SAPHENOUS VEIN W/ LASER Left 12/08/2017   endovenous laser ablation left greater saphenous vein and stab phlebectomy 10-20 incisions left leg by Tinnie Gens MD     There were no vitals filed for this visit.   Subjective Assessment - 10/26/19 1027    Subjective  Starts with a HA in Rt suboccipital region. It hurts deep inside. It goes down into shoulder. I noticed that it would happen when I was very stressed. Pain lasted about a week. The pain comes and goes, not every day but yesterday I had it all day. Reports N/T in UE when it happens. Occasional vision changes and nausea. Started having pain in bottom Rt molar- when I had it checked, they said it was also the one on the top. They did a root canal and they are going  to put a crown in.    Patient Stated Goals  decrease pain neck seems to move okay, arm is hard to move- chronic on Lt    Currently in Pain?  Yes    Pain Score  3     Pain Location  Neck    Pain Orientation  Right    Pain Descriptors / Indicators  Sore;Headache    Pain Radiating Towards  moves to Lt suboccipital region    Aggravating Factors   stress    Pain Relieving Factors  medicine helped a little bit but not much         [facility] PT Assessment - 10/26/19 0001      Assessment   Medical Diagnosis  occipital neuralgia    Referring Provider (PT)  Lynne Leader, MD    Onset Date/Surgical Date  --   about 3 mo ago   Prior Therapy  no      Precautions   Precautions  None      Restrictions   Weight Bearing Restrictions  No      Balance Screen   Has the patient fallen in the past 6 months  No      [place] residence    Living Arrangements  Children      Prior Function   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      Cognition   Overall Cognitive Status  Within Functional Limits for tasks assessed      Observation/Other Assessments   Focus on Therapeutic Outcomes (FOTO)   42% limited      Sensation   Additional Comments  N/T in RUE      Posture/Postural Control   Posture Comments  Rt GHJ depression & fwd rounded      ROM / Strength   AROM / PROM / Strength  AROM      AROM   Overall AROM Comments  GHJ WFL    AROM Assessment Site  Cervical    Cervical Extension  pain    Cervical - Left Rotation  pain on Rt      Palpation   Palpation comment  concordant pain to Rt upper trap, scalenes, suboccipitals                Objective measurements completed on examination: See above findings.      [facility] PT Treatment/Exercise - 10/26/19 0001      Manual Therapy   Manual Therapy  Soft tissue mobilization    Soft tissue mobilization  trigger point release & STM to suboccipitals, scalenes, upper traps on Rt side              PT Education - 10/26/19 1054    Education Details  anatomy of condition, POC, HEP, exercise form/raitonale    Person(s) Educated  Patient    Methods  Explanation;Demonstration;Verbal cues    Comprehension  Verbalized understanding;Returned demonstration;Verbal cues required;Need further instruction          PT Long Term Goals - 10/26/19 1255      PT LONG TERM GOAL #1   Title  Cervical ROM WFL and without discomfort    Baseline  pain in extension and Lt rotation    Time  6    Period  Weeks    Status  New    Target Date  12/10/19      PT LONG TERM GOAL #2   Title  Resolution of HA pain    Baseline  irregular but frequent at eval    Time  6    Period  Weeks    Status  New    Target Date  12/10/19      PT LONG TERM GOAL #3   Title  Pt will be able to reach into overhead cabinets without limitation by neck/shoulder pain    Baseline  significant at eval    Time  6    Period  Weeks    Status  New    Target Date  12/10/19      PT LONG TERM GOAL #4   Title  FOTO to 31% limited    Baseline  42% limited at eval    Time  6    Period  Weeks    Status  New    Target Date  12/10/19             Plan - 10/26/19 1055    Clinical Impression Statement  pt presents to PT with complaints of Rt-sided suboccipital and neck/shoulder pain that began somewhat recently and thinks it may be in conjunction with root canal done on Rt side. Significant spasm in Rt suboccipitals and upper traps that were reduced with manual therapy today but not completely resolved. Good strength and  ROM noted but reproduced pain. Pt will benefit from skilled PT in order to reduce excessive spasm and correct postural alignment to meet functional goals.    Personal Factors and Comorbidities  Comorbidity 1    Comorbidities  h/o Lt bells palsy, recent dental proceedure    Examination-Activity Limitations  Bathing;Bed Mobility;Reach Overhead;Self Feeding;Sit;Caring for  Others;Carry;Sleep;Dressing;Lift;Hygiene/Grooming    Examination-Participation Restrictions  Cleaning;Laundry;Driving    Stability/Clinical Decision Making  Stable/Uncomplicated    Clinical Decision Making  Low    Rehab Potential  Good    PT Frequency  2x / week    PT Duration  6 weeks    PT Treatment/Interventions  ADLs/Self Care Home Management;Cryotherapy;Electrical Stimulation;Ultrasound;Traction;Moist Heat;Iontophoresis 4mg /ml Dexamethasone;Functional mobility training;Therapeutic activities;Therapeutic exercise;Patient/family education;Neuromuscular re-education;Manual techniques;Passive range of motion;Dry needling;Spinal Manipulations;Taping    PT Next Visit Plan  DN to suboccipitals, upper traps, scalenes PRN; begin periscapular strengthening    PT Home Exercise Plan  upper trap stretch, scap retraction    Consulted and Agree with Plan of Care  Patient       Patient will benefit from skilled therapeutic intervention in order to improve the following deficits and impairments:  Impaired UE functional use, Increased muscle spasms, Decreased activity tolerance, Pain, Improper body mechanics, Postural dysfunction, Impaired sensation  Visit Diagnosis: Cervicalgia - Plan: PT plan of care cert/re-cert  Acute pain of right shoulder - Plan: PT plan of care cert/re-cert  Nonintractable episodic headache, unspecified headache type - Plan: PT plan of care cert/re-cert     Problem List Patient Active Problem List   Diagnosis Date Noted  . Mood changes 07/28/2019  . Dyspareunia in female 07/28/2019  . Menorrhagia with regular cycle 07/28/2019  . S/P cholecystectomy 07/28/2019  . Nevus, non-neoplastic 08/06/2017  . Dermatofibroma 08/06/2017  . Seborrheic keratoses 06/12/2017  . 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 Pruritic erythematous rash 06/12/2017  . Varicose veins of left lower extremity with complications [id number]  . Vitamin D insufficiency 08/16/2016  . Joint laxity of  right knee 08/15/2016  . Chronic pain of left ankle 08/15/2016  . Chronic pain of right ankle 08/15/2016  . Fatigue 08/15/2016  . Stress incontinence, female 02/12/2016  . Breast tenderness in female 02/12/2016  . Allergic rhinitis 08/11/2015  . Snoring 08/11/2015  . Laryngopharyngeal reflux 08/11/2015    Jeslie Lowe C. Kylil Swopes PT, DPT 10/26/19 3:08 PM   [facility] [address] [place], [place], [address] Phone: [phone]   Fax:  [phone]  Name: Alicia Miller MRN: [id number] Date of Birth: 1976-04-30

## 2019-10-28 ENCOUNTER — Other Ambulatory Visit: Payer: Self-pay

## 2019-10-28 ENCOUNTER — Encounter: Payer: Self-pay | Admitting: Family Medicine

## 2019-10-28 ENCOUNTER — Ambulatory Visit: Payer: Self-pay | Attending: Family Medicine | Admitting: Family Medicine

## 2019-10-28 DIAGNOSIS — E782 Mixed hyperlipidemia: Secondary | ICD-10-CM

## 2019-10-28 DIAGNOSIS — R202 Paresthesia of skin: Secondary | ICD-10-CM

## 2019-10-28 DIAGNOSIS — Z789 Other specified health status: Secondary | ICD-10-CM

## 2019-10-28 DIAGNOSIS — N920 Excessive and frequent menstruation with regular cycle: Secondary | ICD-10-CM

## 2019-10-28 DIAGNOSIS — Z758 Other problems related to medical facilities and other health care: Secondary | ICD-10-CM

## 2019-10-28 DIAGNOSIS — Z603 Acculturation difficulty: Secondary | ICD-10-CM

## 2019-10-28 DIAGNOSIS — R519 Headache, unspecified: Secondary | ICD-10-CM

## 2019-10-28 DIAGNOSIS — G4486 Cervicogenic headache: Secondary | ICD-10-CM

## 2019-10-28 DIAGNOSIS — Z79899 Other long term (current) drug therapy: Secondary | ICD-10-CM

## 2019-10-28 DIAGNOSIS — M79609 Pain in unspecified limb: Secondary | ICD-10-CM

## 2019-10-28 MED ORDER — ROSUVASTATIN CALCIUM 10 MG PO TABS: 10.0000 mg | ORAL_TABLET | Freq: Every day | ORAL | 5 refills | Status: DC

## 2019-10-28 MED FILL — ?ROSUVASTATIN CALCIUM 10 MG: 10 | 30 days supply | Qty: 30 | Fill #0

## 2019-10-28 NOTE — Progress Notes (Signed)
Patient verified DOB Patient has not taken medication. Patient has eaten today. Patient denies pain at this time. Patient states she takes OTC medications for intermittent HA. Patient had a HA yesterday and used robaxin to aide in relief.

## 2019-10-28 NOTE — Progress Notes (Signed)
Virtual Visit via Telephone Note  I connected with Alicia Miller on 10/28/19 at  9:50 AM EST by telephone and verified that I am speaking with the correct person using two identifiers.   I discussed the limitations, risks, security and privacy concerns of performing an evaluation and management service by telephone and the availability of in person appointments. I also discussed with the patient that there may be a patient responsible charge related to this service. The patient expressed understanding and agreed to proceed.  Patient Location: Home Provider Location: [facility] Others participating in call: call initiated by [place], [place]  Who obtained a Spanish speaking interpreter through [place]   History of Present Illness:      43 yo female who is seen in follow-up of neck pain, recurrent headaches and pain/numbness in left hand/fingers. She has also had recent labs with an increase in triglycerides and bad cholesterol on 08/25/2019 and she is not currently on medication for hyperlipidemia.  She has been trying to follow a low-fat diet.  She does not have any family history that she is aware of of heart disease, heart attack or stroke.  She believes that her father's mother/her paternal grandmother may have had diabetes.  Patient has seen neurology and another specialist and has had 1 physical therapy appointment regarding her neck pain/headaches.  She continues to have difficulty with neck pain and headaches and pain is about a 6 on a 0-to-10 scale when she has flareup of neck pain/headaches and can get as bad as an 8 on a 0-to-10 scale.  Pain can be sharp and stabbing in the posterior neck/scalp.  She also reports continued issues with heavy menses for which she would like to be seen by GYN.  No current abdominal or pelvic pain.  No nausea/vomiting/diarrhea or constipation other than occasional nausea with headaches.  No chest pain or palpitations, no shortness  of breath or cough.  No recent fever or chills.  No difficulty swallowing or reflux symptoms.  No current urinary frequency or dysuria.  No dizziness or loss of balance.   Past Medical History:  Diagnosis Date  . 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 Cancer (HCC)    Cervical  . Depression   . Laryngospasm 12/10/2016  . Left-sided Bell's palsy 01/19/2016  . No pertinent past medical history   . Varicose veins of left leg with edema     Past Surgical History:  Procedure Laterality Date  . CHOLECYSTECTOMY  05/2015  . ENDOVENOUS ABLATION SAPHENOUS VEIN W/ LASER Left 12/08/2017   endovenous laser ablation left greater saphenous vein and stab phlebectomy 10-20 incisions left leg by Tinnie Gens MD     Family History  Problem Relation Age of Onset  . Diabetes Father   . Cancer Sister        uterine  . Diabetes Sister   . Diabetes Paternal Grandmother   . Diabetes Paternal Uncle   . Diabetes Paternal Uncle   . Diabetes Paternal Uncle   . Diabetes Paternal Uncle   . Diabetes Paternal Uncle   . Diabetes Paternal Uncle     Social History   Tobacco Use  . Smoking status: Never Smoker  . Smokeless tobacco: Never Used  Substance Use Topics  . Alcohol use: No  . Drug use: No     Allergies  Allergen Reactions  .  Bactrim [Sulfamethoxazole-Trimethoprim] Itching       Observations/Objective: No vital signs or physical exam conducted as visit was done via telephone  Assessment and Plan: 1. Mixed hyperlipidemia Recent blood work reviewed again at today's visit with the patient and patient with lipid panel on 08/25/2019 with total cholesterol of 230, triglycerides of 208 and LDL of 146.  She agrees to start Crestor 10 mg in addition to low-fat diet to help lower her lipids.  She has been asked to make lab visit in 4 to 6 weeks for hepatic function panel/LFTs after new start of Crestor.  She is  asked to call or return sooner if she has any difficulty tolerating the medication or any concerns. - rosuvastatin (CRESTOR) 10 MG tablet; Take 1 tablet (10 mg total) by mouth daily. To lower cholesterol  Dispense: 30 tablet; Refill: 5  2. Encounter for long-term current use of medication Patient will return in 4 to 6 weeks for hepatic function panel/LFTs in follow-up of new start of Crestor for treatment of hyperlipidemia. - rosuvastatin (CRESTOR) 10 MG tablet; Take 1 tablet (10 mg total) by mouth daily. To lower cholesterol  Dispense: 30 tablet; Refill: 5 - Hepatic Function Panel; Future  3. Cervicogenic headache Patient has had imaging showing some degenerative changes at C5-6 and patient's headaches are thought to be related to occipital neuralgia for which she was given information in Spanish at her visit on 10/12/2019 with sports medicine fellow.  She can continue the use of nonsteroidal anti-inflammatories or acetaminophen as tolerated to help with pain and continue physical therapy follow-up.  4. Paresthesia and pain of left extremity She has upcoming appointment with orthopedics and neurology for further evaluation of pain and numbness in the left arm which is thought to be related to possible carpal tunnel syndrome as well as possible nerve entrapment at the elbow.  EMG is scheduled for 11/02/2019.  5. Menorrhagia with regular cycle GYN referral placed per patient request secondary to issues with heavy menses. - Ambulatory referral to Gynecology  6.  Language barrier [place] interpretation systems used at today's visit for Spanish-speaking interpreter to help with language barrier which might otherwise affect healthcare  Follow Up Instructions:Return in about 4 months (around 02/26/2020) for Chronic issues-sooner if needed; 4 to 6-week lab visit.    I discussed the assessment and treatment plan with the patient. The patient was provided an opportunity to ask questions and all were  answered. The patient agreed with the plan and demonstrated an understanding of the instructions.   The patient was advised to call back or seek an in-person evaluation if the symptoms worsen or if the condition fails to improve as anticipated.  I provided 16 minutes of non-face-to-face time during this encounter.  Additional 12 minutes spent on review of medical records/specialty visits, diagnostic imaging and labs.   Antony Blackbird, MD

## 2019-11-01 ENCOUNTER — Other Ambulatory Visit: Payer: Self-pay

## 2019-11-02 ENCOUNTER — Ambulatory Visit (INDEPENDENT_AMBULATORY_CARE_PROVIDER_SITE_OTHER): Payer: Self-pay | Admitting: Neurology

## 2019-11-02 ENCOUNTER — Telehealth: Payer: Self-pay

## 2019-11-02 ENCOUNTER — Other Ambulatory Visit: Payer: Self-pay

## 2019-11-02 ENCOUNTER — Telehealth: Payer: Self-pay | Admitting: Surgery

## 2019-11-02 DIAGNOSIS — G5621 Lesion of ulnar nerve, right upper limb: Secondary | ICD-10-CM

## 2019-11-02 DIAGNOSIS — G5601 Carpal tunnel syndrome, right upper limb: Secondary | ICD-10-CM

## 2019-11-02 DIAGNOSIS — M5481 Occipital neuralgia: Secondary | ICD-10-CM

## 2019-11-02 DIAGNOSIS — M542 Cervicalgia: Secondary | ICD-10-CM

## 2019-11-02 NOTE — Telephone Encounter (Signed)
Called Patient per Jeneen Rinks. Scheduled for 11/04/2019.     Advised her Benjiman Core would like to see her back in the office to f/u on her Neck and also to f/u on the neurology appt.

## 2019-11-02 NOTE — Telephone Encounter (Signed)
Called spoke with patient (son translated) patient aware and understands results.

## 2019-11-02 NOTE — Telephone Encounter (Signed)
-----   Message from Pieter Partridge, DO sent at 11/02/2019  3:28 PM EST ----- Nerve study shows pinching of a nerve at the elbow as well as evidence of carpal tunnel syndrome, which is likely contributing to her hand numbness.  I have sent a message to her orthopedist who treated her neck pain and asked if there is somebody at his practice that would be able to treat this.  If not, then we can refer her to somebody.

## 2019-11-02 NOTE — Telephone Encounter (Signed)
Today I received a note from neurologist Dr. Metta Clines.  He had evaluated patient for headaches and he also performed NCV/EMG study right upper extremity.  Study showed severe right carpal tunnel syndrome and moderate right cubital tunnel.  Patient was made an appointment to follow-up with me this Thursday and I will have my attending Dr. Basil Dess see patient during that visit to discuss potential surgical intervention.  We will also make decision at that point if cervical MRI is indicated for previous issues addressed there.

## 2019-11-02 NOTE — Procedures (Signed)
[facility]  [place], [place]  [place], [place] [address] Tel: [phone] Fax:  [phone] Test Date:  11/02/2019  Patient: Alicia Miller DOB: May 06, 1976 Physician: Narda Amber, DO  Sex: Female Height: 5\' 2"  Ref Phys: Metta Clines, DO  ID#: [id number] Temp: 32.0C Technician:    Patient Complaints: This is a 43 year old female referred for evaluation of arm and leg paresthesias.  NCV & EMG Findings: Extensive electrodiagnostic testing of the right upper and lower extremity shows:  1. Right median sensory response shows prolonged latency (4.6 ms) and reduced amplitude (10.7 V).  Right ulnar sensory response shows prolonged latency (4.2 ms).   2. Right sural and superficial peroneal sensory responses are within normal limits. 3. Right median motor response shows prolonged latency (6.1 ms) and reduced amplitude (5.1 mV).  Of note, there is evidence of right Martin-Gruber anastomosis as seen by a greater proximal median amplitude and a motor response at the ulnar-wrist recording at the abductor pollicis brevis muscle.  Right ulnar motor response shows slowed conduction velocity across the elbow (A Elbow-B Elbow, 31 m/s).   4. Right tibial and peroneal motor responses are within normal limits.   5. Chronic motor axonal loss changes are isolated to the right first dorsal interosseous and abductor digiti minimi muscles, without accompanied active denervation.  The remaining tested muscles show normal motor unit recruitment and configuration.     Impression: 1. Right median neuropathy at or distal to the wrist (severe), consistent with a clinical diagnosis of carpal tunnel syndrome.   2. Right ulnar neuropathy with slowing across the elbow, purely demyelinating, moderate. 3. Incidentally, there is a right Martin-Gruber anastomosis, a normal anatomic variant. 4. There is no evidence of a sensorimotor polyneuropathy or cervical/lumbosacral radiculopathy affecting the  right side.   ___________________________ Narda Amber, DO    Nerve Conduction Studies Anti Sensory Summary Table   Site NR Peak (ms) Norm Peak (ms) P-T Amp (V) Norm P-T Amp  Right Median Anti Sensory (2nd Digit)  Wrist    4.6 <3.4 10.7 >20  Right Sup Peroneal Anti Sensory (Ant Lat Mall)  12 cm    3.1 <4.5 14.6 >5  Right Sural Anti Sensory (Lat Mall)  Calf    3.1 <4.5 14.5 >5  Right Ulnar Anti Sensory (5th Digit)  Wrist    4.2 <3.1 12.8 >12   Motor Summary Table   Site NR Onset (ms) Norm Onset (ms) O-P Amp (mV) Norm O-P Amp Site1 Site2 Delta-0 (ms) Dist (cm) Vel (m/s) Norm Vel (m/s)  Right Median Motor (Abd Poll Brev)  Wrist    6.1 <3.9 5.1 >6 Elbow Wrist 4.1 29.0 71 >50  Elbow    10.2  6.8  Ulnar-wrist crossover Elbow 5.0 0.0    Ulnar-wrist crossover    5.2  6.7         Right Peroneal Motor (Ext Dig Brev)  Ankle    4.1 <5.5 5.0 >3 B Fib Ankle 8.1 33.0 41 >40  B Fib    12.2  4.2  Poplt B Fib 1.6 8.0 50 >40  Poplt    13.8  4.1         Right Tibial Motor (Abd Hall Brev)  Ankle    4.0 <6.0 10.2 >8 Knee Ankle 9.0 36.0 40 >40  Knee    13.0  6.6         Right Ulnar Motor (Abd Dig Minimi)  Wrist    2.8 <3.1 9.4 >7 B  Elbow Wrist 4.6 23.0 50 >50  B Elbow    7.4  6.7  A Elbow B Elbow 3.2 10.0 31 >50  A Elbow    10.6  5.1          EMG   Side Muscle Ins Act Fibs Psw Fasc Number Recrt Dur Dur. Amp Amp. Poly Poly. Comment  Right 1stDorInt Nml Nml Nml Nml 1- Rapid Some 1+ Some 1+ Nml Nml N/A  Right AntTibialis Nml Nml Nml Nml Nml Nml Nml Nml Nml Nml Nml Nml N/A  Right Flex Dig Long Nml Nml Nml Nml Nml Nml Nml Nml Nml Nml Nml Nml N/A  Right Gastroc Nml Nml Nml Nml Nml Nml Nml Nml Nml Nml Nml Nml N/A  Right RectFemoris Nml Nml Nml Nml Nml Nml Nml Nml Nml Nml Nml Nml N/A  Right GluteusMed Nml Nml Nml Nml Nml Nml Nml Nml Nml Nml Nml Nml N/A  Right ABD Dig Min Nml Nml Nml Nml 1- Rapid Some 1+ Some 1+ Nml Nml N/A  Right Abd Poll Brev Nml Nml Nml Nml Nml Nml Nml Nml Nml Nml Nml Nml N/A    Right PronatorTeres Nml Nml Nml Nml Nml Nml Nml Nml Nml Nml Nml Nml N/A  Right Biceps Nml Nml Nml Nml Nml Nml Nml Nml Nml Nml Nml Nml N/A  Right Triceps Nml Nml Nml Nml Nml Nml Nml Nml Nml Nml Nml Nml N/A  Right Deltoid Nml Nml Nml Nml Nml Nml Nml Nml Nml Nml Nml Nml N/A  Right FlexCarpiUln Nml Nml Nml Nml Nml Nml Nml Nml Nml Nml Nml Nml N/A      Waveforms:

## 2019-11-04 ENCOUNTER — Ambulatory Visit: Payer: Self-pay

## 2019-11-04 ENCOUNTER — Encounter: Payer: Self-pay | Admitting: Surgery

## 2019-11-04 ENCOUNTER — Other Ambulatory Visit: Payer: Self-pay

## 2019-11-04 ENCOUNTER — Ambulatory Visit (INDEPENDENT_AMBULATORY_CARE_PROVIDER_SITE_OTHER): Payer: Self-pay | Admitting: Surgery

## 2019-11-04 VITALS — BP 106/72 | HR 81 | Ht 62.0 in | Wt 146.0 lb

## 2019-11-04 DIAGNOSIS — M5441 Lumbago with sciatica, right side: Secondary | ICD-10-CM

## 2019-11-04 DIAGNOSIS — G5621 Lesion of ulnar nerve, right upper limb: Secondary | ICD-10-CM

## 2019-11-04 DIAGNOSIS — M2550 Pain in unspecified joint: Secondary | ICD-10-CM

## 2019-11-04 DIAGNOSIS — M5442 Lumbago with sciatica, left side: Secondary | ICD-10-CM

## 2019-11-04 DIAGNOSIS — M4722 Other spondylosis with radiculopathy, cervical region: Secondary | ICD-10-CM

## 2019-11-04 DIAGNOSIS — G5601 Carpal tunnel syndrome, right upper limb: Secondary | ICD-10-CM

## 2019-11-04 DIAGNOSIS — M5416 Radiculopathy, lumbar region: Secondary | ICD-10-CM

## 2019-11-04 NOTE — Progress Notes (Signed)
Office Visit Note   Patient: Alicia Miller           Date of Birth: 1976/10/23           MRN: [id number] Visit Date: 11/04/2019              Requested by: Antony Blackbird, MD [place] [id number] PCP: Antony Blackbird, MD   Assessment & Plan: Visit Diagnoses:  1. Radiculopathy, lumbar region   2. Acute bilateral low back pain with bilateral sciatica   3. Polyarthralgia   4. Other spondylosis with radiculopathy, cervical region   5. Carpal tunnel syndrome, right upper limb   6. Cubital tunnel syndrome on right     Plan: With patient's ongoing neck pain and right upper extremity radicular symptoms I recommend getting a cervical MRI as I previously discussed with patient last office visit.  She had minimal temporary improvement with the prednisone taper that was given.  I advised patient also that ultimately it would likely come down to her needing surgical intervention for her right carpal tunnel and and right cubital tunnel.  Surgical procedure was briefly discussed with her with the help of interpreter.  I advised patient that I would not recommend her having any occipital nerve blocks or carpal tunnel injections at least until she follows up with Dr. Louanne Skye to review cervical MRI and also discuss results of NCV/EMG study.  I did get blood work today to check an arthritis panel due to the multiple areas of pain that she describes and also she is describing both of her legs feeling hot at times.  I advised her to also discuss this matter with her primary care physician and neurologist.  With the acute low back pain that she discussed today we may consider getting a lumbar x-ray when she returns.  She can continue formal PT.  Follow-Up Instructions: Return in about 3 weeks (around 11/25/2019) for with Dr Louanne Skye to review cervical MRI,labs and to discuss surgery for right carpal and cubital tunnel.   Orders:  Orders Placed This Encounter  Procedures  . XR Lumbar Spine  2-3 Views  . Rheumatoid Factor  . Antinuclear Antib (ANA)  . Uric acid  . Sed Rate (ESR)   No orders of the defined types were placed in this encounter.     Procedures: No procedures performed   Clinical Data: No additional findings.   Subjective: Chief Complaint  Patient presents with  . Neck - Follow-up    HPI 43 year old Hispanic female comes in with interpreter today for recheck multiple problems addressed by me last office visit.  She continues have ongoing neck pain with right upper extremity radicular symptoms.  She did get minimal temporary relief with the prednisone taper.  She was seen by Dr. Metta Clines neurologist.  He did send me a message through the epic system advised me that he did perform the NCV/EMG study.  The study performed November 02, 2019 showed right median neuropathy at the wrist which is severe consistent with a clinical diagnosis of carpal tunnel syndrome.  Right ulnar neuropathy with slowing across the elbow, purely demyelinating, moderate.  Patient states that she has had some ongoing issues with grip weakness and has trouble opening of jars and has drop objects at times.  She is right-hand dominant.  Dr. Susa Raring referred patient to Dr. Lynne Leader primary care sports medicine physician and I reviewed his note.  For her neck pain who recommended physical therapy  which she has tried.  Feels that this has may be given slight improvement of her neck pain.  He also discussed possible trial of occipital nerve blocks.  Patient has a new complaint today of low back pain with radiation to bilateral buttocks and left greater than right thigh.  States that this started yesterday.  She describes having a "hot" feeling down both legs at times.      Review of Systems No current cardiac pulmonary GI GU issues  Objective: Vital Signs: BP 106/72   Pulse 81   Ht _0  (1.575 m)   Wt 146 lb (66.2 kg)   LMP 10/22/2019   BMI 26.70 kg/m   Physical Exam Pleasant female  alert oriented in no acute distress.  Gait is normal. Ortho Exam  Specialty Comments:  No specialty comments available.  Imaging: No results found.   PMFS History: Patient Active Problem List   Diagnosis Date Noted  . Mood changes 07/28/2019  . Dyspareunia in female 07/28/2019  . Menorrhagia with regular cycle 07/28/2019  . S/P cholecystectomy 07/28/2019  . Nevus, non-neoplastic 08/06/2017  . Dermatofibroma 08/06/2017  . Seborrheic keratoses 06/12/2017  . 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 Pruritic erythematous rash 06/12/2017  . Varicose veins of left lower extremity with complications 71/69/6789  . Vitamin D insufficiency 08/16/2016  . Joint laxity of right knee 08/15/2016  . Chronic pain of left ankle 08/15/2016  . Chronic pain of right ankle 08/15/2016  . Fatigue 08/15/2016  . Stress incontinence, female 02/12/2016  . Breast tenderness in female 02/12/2016  . Allergic rhinitis 08/11/2015  . Snoring 08/11/2015  . Laryngopharyngeal reflux 08/11/2015   Past Medical History:  Diagnosis Date  . 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 Cancer (HCC)    Cervical  . Depression   . Laryngospasm 12/10/2016  . Left-sided Bell's palsy 01/19/2016  . No pertinent past medical history   . Varicose veins of left leg with edema     Family History  Problem Relation Age of Onset  . Diabetes Father   . Cancer Sister        uterine  . Diabetes Sister   . Diabetes Paternal Grandmother   . Diabetes Paternal Uncle   . Diabetes Paternal Uncle   . Diabetes Paternal Uncle   . Diabetes Paternal Uncle   . Diabetes Paternal Uncle   . Diabetes Paternal Uncle     Past Surgical History:  Procedure Laterality Date  . CHOLECYSTECTOMY  05/2015  . ENDOVENOUS ABLATION SAPHENOUS VEIN W/ LASER Left 12/08/2017   endovenous laser ablation left greater saphenous vein and stab phlebectomy 10-20  incisions left leg by Tinnie Gens MD    Social History   Occupational History  . Not on file  Tobacco Use  . Smoking status: Never Smoker  . Smokeless tobacco: Never Used  Substance and Sexual Activity  . Alcohol use: No  . Drug use: No  . Sexual activity: Yes

## 2019-11-05 ENCOUNTER — Encounter: Payer: Self-pay | Admitting: Physical Therapy

## 2019-11-05 ENCOUNTER — Ambulatory Visit: Payer: Self-pay | Admitting: Physical Therapy

## 2019-11-05 DIAGNOSIS — M542 Cervicalgia: Secondary | ICD-10-CM

## 2019-11-05 DIAGNOSIS — M25511 Pain in right shoulder: Secondary | ICD-10-CM

## 2019-11-05 DIAGNOSIS — R519 Headache, unspecified: Secondary | ICD-10-CM

## 2019-11-05 NOTE — Therapy (Signed)
[facility] [place], [place], [address] Phone: [phone]   Fax:  [phone]  Physical Therapy Treatment  Patient Details  Name: Alicia Miller MRN: [id number] Date of Birth: 1976/06/07 Referring Provider (PT): Lynne Leader, MD   Encounter Date: 11/05/2019  PT End of Session - 11/05/19 1218    Visit Number  2    Number of Visits  13    Date for PT Re-Evaluation  12/10/19    Authorization Type  CAFA 8/21-2/21/21    PT Start Time  1015    PT Stop Time  1107    PT Time Calculation (min)  52 min    Activity Tolerance  Patient tolerated treatment well    Behavior During Therapy  [facility] for tasks assessed/performed       Past Medical History:  Diagnosis Date  . 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 Cancer (HCC)    Cervical  . Depression   . Laryngospasm 12/10/2016  . Left-sided Bell's palsy 01/19/2016  . No pertinent past medical history   . Varicose veins of left leg with edema     Past Surgical History:  Procedure Laterality Date  . CHOLECYSTECTOMY  05/2015  . ENDOVENOUS ABLATION SAPHENOUS VEIN W/ LASER Left 12/08/2017   endovenous laser ablation left greater saphenous vein and stab phlebectomy 10-20 incisions left leg by Tinnie Gens MD     There were no vitals filed for this visit.  Subjective Assessment - 11/05/19 1211    Subjective  Pt. had MD follow up with EMG/NCS which had findings consistent with carpal tunnel syndrome as well as ulnar neuropathy for right UE. She is still pending MRI for cervical region. 2/10 pain today mostly right suboccipital and upper trapeius region. Pt. consents to try dry needling tx. today.    Currently in Pain?  Yes    Pain Score  2     Pain Location  Neck    Pain Orientation  Right    Pain Descriptors / Indicators  Sore;Headache    Aggravating Factors   stress    Pain  Relieving Factors  mild ease with medication                       [facility] Adult PT Treatment/Exercise - 11/05/19 0001      Exercises   Exercises  Neck;Shoulder      Shoulder Exercises: Supine   Horizontal ABduction  AROM;Strengthening;Both;15 reps    Theraband Level (Shoulder Horizontal ABduction)  Level 2 (Red)    External Rotation  AROM;Strengthening;Both;15 reps    Theraband Level (Shoulder External Rotation)  Level 2 (Red)      Shoulder Exercises: Standing   Extension  AROM;Strengthening;Both;15 reps    Theraband Level (Shoulder Extension)  Level 2 (Red)    Row  AROM;Strengthening;Both;15 reps    Theraband Level (Shoulder Row)  Level 2 (Red)      Modalities   Modalities  Moist Heat      Moist Heat Therapy   Number Minutes Moist Heat  10 Minutes    Moist Heat Location  Cervical   right upper trapezius region post-tx.     Manual Therapy   Manual Therapy  Myofascial release    Soft tissue mobilization  STM in sitting right upper trapezius region    Myofascial Release  suboccipital release  Neck Exercises: Stretches   Upper Trapezius Stretch  Right;3 reps;30 seconds    Upper Trapezius Stretch Limitations  supine manual stretch    Other Neck Stretches  supine manual right scalene/SCM stretch 3x30 sec       Trigger Point Dry Needling - 11/05/19 0001    Consent Given?  Yes    Education Handout Provided  --   verbal education   Muscles Treated Head and Neck  Upper trapezius;Suboccipitals;Splenius capitus    Dry Needling Comments  32 gauge 30 mm needles used, brief right upper trapezius needling in left sidelying otherwise needling in prone with estim use for upper trapezius, checked scalenes on right but no significant tenderness or trigger points noted    Electrical Stimulation Performed with Dry Needling  Yes    E-stim with Dry Needling Details  Attended estim to right upper trapezius with pointer plus unit 16 HZ to tolerance                 PT Long Term Goals - 10/26/19 1255      PT LONG TERM GOAL #1   Title  Cervical ROM WFL and without discomfort    Baseline  pain in extension and Lt rotation    Time  6    Period  Weeks    Status  New    Target Date  12/10/19      PT LONG TERM GOAL #2   Title  Resolution of HA pain    Baseline  irregular but frequent at eval    Time  6    Period  Weeks    Status  New    Target Date  12/10/19      PT LONG TERM GOAL #3   Title  Pt will be able to reach into overhead cabinets without limitation by neck/shoulder pain    Baseline  significant at eval    Time  6    Period  Weeks    Status  New    Target Date  12/10/19      PT LONG TERM GOAL #4   Title  FOTO to 31% limited    Baseline  42% limited at eval    Time  6    Period  Weeks    Status  New    Target Date  12/10/19            Plan - 11/05/19 1218    Clinical Impression Statement  Trial dry needling today for muscles as noted per flowsheet. Some brief discomfort but otherwise well-tolerated. Manual tx. to address myofascial pain and worked on periscapular stabilization for posture as well as neck stretches. Will await further response from dry needling and continue as found beneficial.    Personal Factors and Comorbidities  Comorbidity 1    Comorbidities  h/o Lt bells palsy, recent dental proceedure    Examination-Activity Limitations  Bathing;Bed Mobility;Reach Overhead;Self Feeding;Sit;Caring for Others;Carry;Sleep;Dressing;Lift;Hygiene/Grooming    Examination-Participation Restrictions  Cleaning;Laundry;Driving    Stability/Clinical Decision Making  Stable/Uncomplicated    Clinical Decision Making  Low    Rehab Potential  Good    PT Frequency  2x / week    PT Duration  6 weeks    PT Treatment/Interventions  ADLs/Self Care Home Management;Cryotherapy;Electrical Stimulation;Ultrasound;Traction;Moist Heat;Iontophoresis 4mg /ml Dexamethasone;Functional mobility training;Therapeutic  activities;Therapeutic exercise;Patient/family education;Neuromuscular re-education;Manual techniques;Passive range of motion;Dry needling;Spinal Manipulations;Taping    PT Next Visit Plan  DN to suboccipitals, upper traps, scalenes PRN; periscapular strengthening, stretches, try adding median and  ulnar nerve glides/floss    PT Home Exercise Plan  upper trap stretch, scap retraction    Consulted and Agree with Plan of Care  Patient       Patient will benefit from skilled therapeutic intervention in order to improve the following deficits and impairments:  Impaired UE functional use, Increased muscle spasms, Decreased activity tolerance, Pain, Improper body mechanics, Postural dysfunction, Impaired sensation  Visit Diagnosis: Cervicalgia  Acute pain of right shoulder  Nonintractable episodic headache, unspecified headache type     Problem List Patient Active Problem List   Diagnosis Date Noted  . Mood changes 07/28/2019  . Dyspareunia in female 07/28/2019  . Menorrhagia with regular cycle 07/28/2019  . S/P cholecystectomy 07/28/2019  . Nevus, non-neoplastic 08/06/2017  . Dermatofibroma 08/06/2017  . Seborrheic keratoses 06/12/2017  . 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 Pruritic erythematous rash 06/12/2017  . Varicose veins of left lower extremity with complications 123XX123  . Vitamin D insufficiency 08/16/2016  . Joint laxity of right knee 08/15/2016  . Chronic pain of left ankle 08/15/2016  . Chronic pain of right ankle 08/15/2016  . Fatigue 08/15/2016  . Stress incontinence, female 02/12/2016  . Breast tenderness in female 02/12/2016  . Allergic rhinitis 08/11/2015  . Snoring 08/11/2015  . Laryngopharyngeal reflux 08/11/2015    Beaulah Dinning, PT, DPT 11/05/19 12:22 PM  [facility] [address] [place], [place], [address] Phone: [phone]   Fax:  [phone]  Name: Alicia Miller MRN:  [id number] Date of Birth: Dec 13, 1975

## 2019-11-06 LAB — SEDIMENTATION RATE: Sed Rate: 6 mm/h (ref 0–20)

## 2019-11-06 LAB — ANA: Anti Nuclear Antibody (ANA): NEGATIVE

## 2019-11-06 LAB — URIC ACID: Uric Acid, Serum: 4 mg/dL (ref 2.5–7.0)

## 2019-11-06 LAB — RHEUMATOID FACTOR: Rheumatoid fact SerPl-aCnc: <14 IU/mL (ref <14)

## 2019-11-09 ENCOUNTER — Other Ambulatory Visit: Payer: Self-pay | Admitting: Surgery

## 2019-11-09 DIAGNOSIS — M5412 Radiculopathy, cervical region: Secondary | ICD-10-CM

## 2019-11-10 ENCOUNTER — Ambulatory Visit: Payer: Self-pay | Admitting: Physical Therapy

## 2019-11-10 ENCOUNTER — Ambulatory Visit: Payer: No Typology Code available for payment source | Admitting: Family Medicine

## 2019-11-13 ENCOUNTER — Emergency Department (HOSPITAL_COMMUNITY): Payer: Self-pay

## 2019-11-13 ENCOUNTER — Encounter (HOSPITAL_COMMUNITY): Payer: Self-pay | Admitting: Emergency Medicine

## 2019-11-13 ENCOUNTER — Emergency Department (HOSPITAL_COMMUNITY)
Admission: EM | Admit: 2019-11-13 | Discharge: 2019-11-13 | Disposition: A | Discharge status: Home / Self Care | Payer: Self-pay | Attending: Emergency Medicine | Admitting: Emergency Medicine

## 2019-11-13 ENCOUNTER — Other Ambulatory Visit: Payer: Self-pay

## 2019-11-13 DIAGNOSIS — J069 Acute upper respiratory infection, unspecified: Secondary | ICD-10-CM

## 2019-11-13 DIAGNOSIS — R079 Chest pain, unspecified: Secondary | ICD-10-CM

## 2019-11-13 DIAGNOSIS — N390 Urinary tract infection, site not specified: Secondary | ICD-10-CM | POA: Insufficient documentation

## 2019-11-13 DIAGNOSIS — Z8541 Personal history of malignant neoplasm of cervix uteri: Secondary | ICD-10-CM | POA: Insufficient documentation

## 2019-11-13 DIAGNOSIS — Z20828 Contact with and (suspected) exposure to other viral communicable diseases: Secondary | ICD-10-CM | POA: Insufficient documentation

## 2019-11-13 LAB — TROPONIN I (HIGH SENSITIVITY): Troponin I (High Sensitivity): <2 ng/L (ref <18)

## 2019-11-13 LAB — CBC
HCT: 42.9 % (ref 36.0–46.0)
Hemoglobin: 14.6 g/dL (ref 12.0–15.0)
MCH: 28.9 pg (ref 26.0–34.0)
MCHC: 34 g/dL (ref 30.0–36.0)
MCV: 84.8 fL (ref 80.0–100.0)
Platelets: 321 10*3/uL (ref 150–400)
RBC: 5.06 MIL/uL (ref 3.87–5.11)
RDW: 12.2 % (ref 11.5–15.5)
WBC: 6.4 10*3/uL (ref 4.0–10.5)
nRBC: 0 % (ref 0.0–0.2)

## 2019-11-13 LAB — BASIC METABOLIC PANEL
Anion gap: 7 (ref 5–15)
BUN: 8 mg/dL (ref 6–20)
CO2: 23 mmol/L (ref 22–32)
Calcium: 9 mg/dL (ref 8.9–10.3)
Chloride: 106 mmol/L (ref 98–111)
Creatinine, Ser: 0.63 mg/dL (ref 0.44–1.00)
GFR calc Af Amer: >60 mL/min (ref >60)
GFR calc non Af Amer: >60 mL/min (ref >60)
Glucose, Bld: 89 mg/dL (ref 70–99)
Potassium: 3.9 mmol/L (ref 3.5–5.1)
Sodium: 136 mmol/L (ref 135–145)

## 2019-11-13 LAB — I-STAT BETA HCG BLOOD, ED (MC, WL, AP ONLY): I-stat hCG, quantitative: <5 m[IU]/mL (ref <5)

## 2019-11-13 MED ORDER — SODIUM CHLORIDE 0.9% FLUSH: 3.0000 mL | Freq: Once | INTRAVENOUS | Status: DC

## 2019-11-13 NOTE — ED Triage Notes (Signed)
C/o pain to center of chest, generalized weakness, and non-productive cough since yesterday.

## 2019-11-13 NOTE — ED Notes (Signed)
Pt given dc instructions pt verbalizes understanding.  

## 2019-11-13 NOTE — ED Provider Notes (Signed)
[facility] DEPARTMENT Provider Note   CSN: [id number] Arrival date & time: 11/13/19  1352     History Chief Complaint  Patient presents with  . Chest Pain  . Cough    Alicia Miller is a 43 y.o. female.  The history is provided by the patient.  Cough Cough characteristics:  Non-productive Sputum characteristics:  Nondescript Severity:  Moderate Onset quality:  Gradual Duration:  2 days Timing:  Intermittent Progression:  Waxing and waning Chronicity:  New Smoker: no   Context: sick contacts and upper respiratory infection   Relieved by:  Nothing Worsened by:  Nothing Associated symptoms: chills, myalgias and sinus congestion   Associated symptoms: no chest pain, no ear pain, no fever, no rash, no shortness of breath and no sore throat        Past Medical History:  Diagnosis Date  . 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 Cancer (HCC)    Cervical  . Depression   . Laryngospasm 12/10/2016  . Left-sided Bell's palsy 01/19/2016  . No pertinent past medical history   . Varicose veins of left leg with edema     Patient Active Problem List   Diagnosis Date Noted  . Mood changes 07/28/2019  . Dyspareunia in female 07/28/2019  . Menorrhagia with regular cycle 07/28/2019  . S/P cholecystectomy 07/28/2019  . Nevus, non-neoplastic 08/06/2017  . Dermatofibroma 08/06/2017  . Seborrheic keratoses 06/12/2017  . 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 Pruritic erythematous rash 06/12/2017  . Varicose veins of left lower extremity with complications 123XX123  . Vitamin D insufficiency 08/16/2016  . Joint laxity of right knee 08/15/2016  . Chronic pain of left ankle 08/15/2016  . Chronic pain of right ankle 08/15/2016  . Fatigue 08/15/2016  . Stress incontinence, female 02/12/2016  . Breast tenderness in female 02/12/2016  . Allergic  rhinitis 08/11/2015  . Snoring 08/11/2015  . Laryngopharyngeal reflux 08/11/2015    Past Surgical History:  Procedure Laterality Date  . CHOLECYSTECTOMY  05/2015  . ENDOVENOUS ABLATION SAPHENOUS VEIN W/ LASER Left 12/08/2017   endovenous laser ablation left greater saphenous vein and stab phlebectomy 10-20 incisions left leg by Tinnie Gens MD      OB History    Gravida  2   Para  2   Term  2   Preterm  0   AB  0   Living  2     SAB  0   TAB  0   Ectopic      Multiple  0   Live Births  2           Family History  Problem Relation Age of Onset  . Diabetes Father   . Cancer Sister        uterine  . Diabetes Sister   . Diabetes Paternal Grandmother   . Diabetes Paternal Uncle   . Diabetes Paternal Uncle   . Diabetes Paternal Uncle   . Diabetes Paternal Uncle   . Diabetes Paternal Uncle   . Diabetes Paternal Uncle     Social History   Tobacco Use  . Smoking status: Never Smoker  . Smokeless tobacco: Never Used  Substance Use Topics  . Alcohol use: No  . Drug use: No    Home Medications Prior to Admission medications   Medication Sig Start Date End Date Taking? Authorizing Provider  rosuvastatin (CRESTOR) 10 MG tablet Take 1 tablet (10 mg total) by mouth daily. To lower cholesterol 10/28/19   Fulp, Cammie, MD    Allergies    Bactrim [sulfamethoxazole-trimethoprim]  Review of Systems   Review of Systems  Constitutional: Positive for chills. Negative for fever.  HENT: Negative for ear pain and sore throat.   Eyes: Negative for pain and visual disturbance.  Respiratory: Positive for cough and chest tightness. Negative for shortness of breath.   Cardiovascular: Negative for chest pain and palpitations.  Gastrointestinal: Negative for abdominal pain and vomiting.  Genitourinary: Negative for dysuria and hematuria.  Musculoskeletal: Positive for myalgias. Negative for arthralgias and back pain.  Skin: Negative for color change and rash.    Neurological: Negative for seizures and syncope.  All other systems reviewed and are negative.   Physical Exam Updated Vital Signs BP (!) 126/101 (BP Location: Left Arm)   Pulse 88   Temp 98.5 F (36.9 C) (Oral)   Resp 15   LMP 10/22/2019   SpO2 99%   Physical Exam Vitals and nursing note reviewed.  Constitutional:      General: Alicia Miller is not in acute distress.    Appearance: Alicia Miller is well-developed.  HENT:     Head: Normocephalic and atraumatic.  Eyes:     Extraocular Movements: Extraocular movements intact.     Conjunctiva/sclera: Conjunctivae normal.     Pupils: Pupils are equal, round, and reactive to light.  Cardiovascular:     Rate and Rhythm: Normal rate and regular rhythm.     Heart sounds: Normal heart sounds. No murmur.  Pulmonary:     Effort: Pulmonary effort is normal. No respiratory distress.  Abdominal:     Palpations: Abdomen is soft.     Tenderness: There is no abdominal tenderness.  Musculoskeletal:        General: Normal range of motion.     Cervical back: Normal range of motion and neck supple.  Skin:    General: Skin is warm and dry.     Capillary Refill: Capillary refill takes less than 2 seconds.  Neurological:     General: No focal deficit present.     Mental Status: Alicia Miller is alert.  Psychiatric:        Mood and Affect: Mood normal.     ED Results / Procedures / Treatments   Labs (all labs ordered are listed, but only abnormal results are displayed) Labs Reviewed  NOVEL CORONAVIRUS, NAA (HOSP ORDER, SEND-OUT TO REF LAB; TAT 18-24 HRS)  BASIC METABOLIC PANEL  CBC  I-STAT BETA HCG BLOOD, ED ([facility], [facility], [facility] ONLY)  TROPONIN I (HIGH SENSITIVITY)    EKG EKG Interpretation  Date/Time:  Saturday November 13 2019 14:31:28 EST Ventricular Rate:  78 PR Interval:  154 QRS Duration: 80 QT Interval:  368 QTC Calculation: 419 R Axis:   82 Text Interpretation: Normal sinus rhythm Normal ECG Confirmed by Lennice Sites [phone]) on 11/13/2019 5:29:45  PM   Radiology DG Chest 1 View  Result Date: 11/13/2019 CLINICAL DATA:  43 year old female with chest pain. EXAM: CHEST  1 VIEW COMPARISON:  None. FINDINGS: The heart size and mediastinal contours are within normal limits. Both lungs are clear. The visualized skeletal structures are unremarkable. IMPRESSION: No active disease. Electronically Signed   By: Anner Crete M.D.   On: 11/13/2019 17:10    Procedures Procedures (including critical care time)  Medications Ordered in ED Medications  sodium chloride flush (NS) 0.9 % injection 3 mL (has  no administration in time range)    ED Course  I have reviewed the triage vital signs and the nursing notes.  Pertinent labs & imaging results that were available during my care of the patient were reviewed by me and considered in my medical decision making (see chart for details).    MDM Rules/Calculators/A&P                      Alicia Miller is a 43 year old female who presents to the ED with viral type symptoms.  Has been sick with similar symptoms.  Here for Covid testing.  Chest x-ray shows no signs of infection.  EKG shows sinus rhythm.  Troponin normal.  Doubt ACS.  Patient not having cardiac chest pain.  No risk factors for PE and is PERC negative.  Does not have shortness of breath.  Mostly cough and body aches.  Recommend Tylenol.  Will retest for coronavirus and educated about self isolation.  No significant anemia, electrolyte abnormality, kidney injury.  Recommend hydration and given return precautions.  This chart was dictated using voice recognition software.  Despite best efforts to proofread,  errors can occur which can change the documentation meaning.  Alicia Miller was evaluated in Emergency Department on 11/13/2019 for the symptoms described in the history of present illness. Alicia Miller was evaluated in the context of the global COVID-19 pandemic, which necessitated consideration that the patient might be at risk for  infection with the SARS-CoV-2 virus that causes COVID-19. Institutional protocols and algorithms that pertain to the evaluation of patients at risk for COVID-19 are in a state of rapid change based on information released by regulatory bodies including the CDC and federal and state organizations. These policies and algorithms were followed during the patient's care in the ED.  Final Clinical Impression(s) / ED Diagnoses Final diagnoses:  Upper respiratory tract infection, unspecified type    Rx / DC Orders ED Discharge Orders    None       Lennice Sites, DO 11/13/19 1749

## 2019-11-14 LAB — NOVEL CORONAVIRUS, NAA (HOSP ORDER, SEND-OUT TO REF LAB; TAT 18-24 HRS): SARS-CoV-2, NAA: NOT DETECTED

## 2019-11-15 ENCOUNTER — Other Ambulatory Visit: Payer: Self-pay

## 2019-11-15 ENCOUNTER — Ambulatory Visit: Payer: Self-pay | Admitting: Physical Therapy

## 2019-11-16 ENCOUNTER — Encounter: Payer: Self-pay | Admitting: Family Medicine

## 2019-11-16 ENCOUNTER — Ambulatory Visit: Payer: No Typology Code available for payment source | Admitting: Family Medicine

## 2019-11-16 DIAGNOSIS — Q078 Other specified congenital malformations of nervous system: Secondary | ICD-10-CM

## 2019-11-16 HISTORY — DX: Other specified congenital malformations of nervous system: Q07.8

## 2019-11-22 ENCOUNTER — Ambulatory Visit: Payer: Self-pay | Admitting: Physical Therapy

## 2019-11-23 ENCOUNTER — Encounter: Payer: Self-pay | Admitting: Family Medicine

## 2019-11-24 ENCOUNTER — Ambulatory Visit: Payer: Self-pay | Admitting: Physical Therapy

## 2019-11-25 ENCOUNTER — Other Ambulatory Visit: Payer: Self-pay

## 2019-11-29 ENCOUNTER — Encounter: Payer: Self-pay | Admitting: Physical Therapy

## 2019-11-29 ENCOUNTER — Other Ambulatory Visit: Payer: Self-pay

## 2019-11-29 ENCOUNTER — Ambulatory Visit: Payer: Self-pay | Attending: Family Medicine | Admitting: Physical Therapy

## 2019-11-29 DIAGNOSIS — R519 Headache, unspecified: Secondary | ICD-10-CM | POA: Insufficient documentation

## 2019-11-29 DIAGNOSIS — M25511 Pain in right shoulder: Secondary | ICD-10-CM | POA: Insufficient documentation

## 2019-11-29 DIAGNOSIS — M542 Cervicalgia: Secondary | ICD-10-CM | POA: Insufficient documentation

## 2019-11-29 NOTE — Therapy (Signed)
[place], [place], [address] Phone: [phone]   Fax:  [phone]  Physical Therapy Treatment  Patient Details  Name: Alicia Miller MRN: [id number] Date of Birth: Aug 13, 1976 Referring Provider (PT): Lynne Leader, MD   Encounter Date: 11/29/2019  PT End of Session - 11/29/19 1050    Visit Number  3    Number of Visits  13    Date for PT Re-Evaluation  12/10/19    Authorization Type  CAFA 8/21-2/21/21    PT Start Time  1016    PT Stop Time  1058    PT Time Calculation (min)  42 min    Activity Tolerance  Patient tolerated treatment well    Behavior During Therapy  [facility] for tasks assessed/performed       Past Medical History:  Diagnosis Date  . 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 Cancer (HCC)    Cervical  . Depression   . Laryngospasm 12/10/2016  . Left-sided Bell's palsy 01/19/2016  . 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 No pertinent past medical history   . Varicose veins of left leg with edema     Past Surgical History:  Procedure Laterality Date  . CHOLECYSTECTOMY  05/2015  . ENDOVENOUS ABLATION SAPHENOUS VEIN W/ LASER Left 12/08/2017   endovenous laser ablation left greater saphenous vein and stab phlebectomy 10-20 incisions left leg by Tinnie Gens MD     There were no vitals filed for this visit.  Subjective Assessment - 11/29/19 1017    Subjective  Pt. returns, not seen since 11/04/20-she had had some upper respiratory symptoms, tested (-) for Covid but quarantined due to spouse having (+) Covid test. She reports at the time that her "bones" were hurting all over at the time. Neck feeling better today without pain this AM.    Currently in Pain?  No/denies         [facility] PT Assessment - 11/29/19 0001      AROM   Cervical Flexion  50    Cervical Extension  35   increased neck pain   Cervical - Right Side Bend  38    Cervical - Left Side Bend  39    Cervical - Right Rotation  58    Cervical - Left Rotation  75                   [facility] Adult PT Treatment/Exercise - 11/29/19 0001      Neck Exercises: Theraband   Shoulder Extension  15 reps;Red    Rows  15 reps;Red    Shoulder External Rotation  15 reps;Red    Shoulder External Rotation Limitations  supine    Horizontal ABduction  15 reps;Red    Horizontal ABduction Limitations  supine    Other Theraband Exercises  scapular protraction holding red Theraband with bilat. UE cues to keep elbows extended      Manual Therapy   Manual Therapy  Manual Traction    Soft tissue mobilization  STM in sitting bilat. upper trapezius region    Myofascial Release  suboccipital release    Manual Traction  Gentle manual cervical traction      Neck Exercises: Stretches   Upper Trapezius Stretch  Right;Left;2 reps;20 seconds    Levator Stretch  Right;Left;2 reps;20 seconds  Trigger Point Dry Needling - 11/29/19 0001    Consent Given?  Yes    Muscles Treated Head and Neck  Upper trapezius    Dry Needling Comments  needled in prone bilat. with 32 gauge 30 mm needles    Electrical Stimulation Performed with Dry Needling  Yes    E-stim with Dry Needling Details  unattended estim 20 pps x 10 min (TENS)           PT Education - 11/29/19 1049    Education Details  POC, exercises    Person(s) Educated  Patient    Methods  Explanation;Demonstration;Verbal cues;Tactile cues    Comprehension  Verbalized understanding;Returned demonstration          PT Long Term Goals - 10/26/19 1255      PT LONG TERM GOAL #1   Title  Cervical ROM WFL and without discomfort    Baseline  pain in extension and Lt rotation    Time  6    Period  Weeks    Status  New    Target Date  12/10/19      PT LONG TERM GOAL #2   Title  Resolution of HA pain    Baseline  irregular but  frequent at eval    Time  6    Period  Weeks    Status  New    Target Date  12/10/19      PT LONG TERM GOAL #3   Title  Pt will be able to reach into overhead cabinets without limitation by neck/shoulder pain    Baseline  significant at eval    Time  6    Period  Weeks    Status  New    Target Date  12/10/19      PT LONG TERM GOAL #4   Title  FOTO to 31% limited    Baseline  42% limited at eval    Time  6    Period  Weeks    Status  New    Target Date  12/10/19            Plan - 11/29/19 1050    Clinical Impression Statement  Still unclear etiology more generalized pain symptoms as noted in subjective but respondong well to tx. thus far for decreased neck/suboccipital pain. Still with a fair degress of postural tension addressed today with exercises, manual therapy and dry needling. Progress re: therapy goals ongoing.    Personal Factors and Comorbidities  Comorbidity 1    Comorbidities  h/o Lt bells palsy, recent dental proceedure    Examination-Activity Limitations  Bathing;Bed Mobility;Reach Overhead;Self Feeding;Sit;Caring for Others;Carry;Sleep;Dressing;Lift;Hygiene/Grooming    Examination-Participation Restrictions  Cleaning;Laundry;Driving    Stability/Clinical Decision Making  Stable/Uncomplicated    Clinical Decision Making  Low    Rehab Potential  Good    PT Frequency  2x / week    PT Duration  6 weeks    PT Treatment/Interventions  ADLs/Self Care Home Management;Cryotherapy;Electrical Stimulation;Ultrasound;Traction;Moist Heat;Iontophoresis 4mg /ml Dexamethasone;Functional mobility training;Therapeutic activities;Therapeutic exercise;Patient/family education;Neuromuscular re-education;Manual techniques;Passive range of motion;Dry needling;Spinal Manipulations;Taping    PT Next Visit Plan  DN to suboccipitals, upper traps, scalenes PRN; periscapular strengthening, stretches, try adding median and ulnar nerve glides/floss    PT Home Exercise Plan  upper trap stretch,  scap retraction    Consulted and Agree with Plan of Care  Patient       Patient will benefit from skilled therapeutic intervention in order to improve the following deficits and  impairments:  Impaired UE functional use, Increased muscle spasms, Decreased activity tolerance, Pain, Improper body mechanics, Postural dysfunction, Impaired sensation  Visit Diagnosis: Cervicalgia  Acute pain of right shoulder  Nonintractable episodic headache, unspecified headache type     Problem List Patient Active Problem List   Diagnosis Date Noted  . Martin-Gruber anastomosis ([place]) Right 11/16/2019  . Mood changes 07/28/2019  . Dyspareunia in female 07/28/2019  . Menorrhagia with regular cycle 07/28/2019  . S/P cholecystectomy 07/28/2019  . Nevus, non-neoplastic 08/06/2017  . Dermatofibroma 08/06/2017  . Seborrheic keratoses 06/12/2017  . 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 Pruritic erythematous rash 06/12/2017  . Varicose veins of left lower extremity with complications 123XX123  . Vitamin D insufficiency 08/16/2016  . Joint laxity of right knee 08/15/2016  . Chronic pain of left ankle 08/15/2016  . Chronic pain of right ankle 08/15/2016  . Fatigue 08/15/2016  . Stress incontinence, female 02/12/2016  . Breast tenderness in female 02/12/2016  . Allergic rhinitis 08/11/2015  . Snoring 08/11/2015  . Laryngopharyngeal reflux 08/11/2015    Beaulah Dinning, PT, DPT 11/29/19 10:53 AM  [facility] [address] [place], [place], [address] Phone: [phone]   Fax:  [phone]  Name: Alicia Miller MRN: [id number] Date of Birth: Apr 02, 1976

## 2019-11-29 NOTE — Therapy (Signed)
[place], [place], [address] Phone: [phone]   Fax:  [phone]  Physical Therapy Treatment  Patient Details  Name: Alicia Miller MRN: [id number] Date of Birth: 28-Feb-1976 Referring Provider (PT): Lynne Leader, MD   Encounter Date: 11/29/2019  PT End of Session - 11/29/19 1050    Visit Number  3    Number of Visits  13    Date for PT Re-Evaluation  12/10/19    Authorization Type  CAFA 8/21-2/21/21    PT Start Time  1016    PT Stop Time  1058    PT Time Calculation (min)  42 min    Activity Tolerance  Patient tolerated treatment well    Behavior During Therapy  [facility] for tasks assessed/performed       Past Medical History:  Diagnosis Date  . 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 Cancer (HCC)    Cervical  . Depression   . Laryngospasm 12/10/2016  . Left-sided Bell's palsy 01/19/2016  . 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 No pertinent past medical history   . Varicose veins of left leg with edema     Past Surgical History:  Procedure Laterality Date  . CHOLECYSTECTOMY  05/2015  . ENDOVENOUS ABLATION SAPHENOUS VEIN W/ LASER Left 12/08/2017   endovenous laser ablation left greater saphenous vein and stab phlebectomy 10-20 incisions left leg by Tinnie Gens MD     There were no vitals filed for this visit.  Subjective Assessment - 11/29/19 1017    Subjective  Pt. returns, not seen since 11/04/20-she had had some upper respiratory symptoms, tested (-) for Covid but quarantined due to spouse having (+) Covid test. She reports at the time that her "bones" were hurting all over at the time. Neck feeling better today without pain this AM.    Patient is accompained by:  Interpreter   Donald Prose [phone]   Currently in Pain?  No/denies         [facility] PT Assessment  - 11/29/19 0001      AROM   Cervical Flexion  50    Cervical Extension  35   increased neck pain   Cervical - Right Side Bend  38    Cervical - Left Side Bend  39    Cervical - Right Rotation  58    Cervical - Left Rotation  75                   [facility] Adult PT Treatment/Exercise - 11/29/19 0001      Neck Exercises: Theraband   Shoulder Extension  15 reps;Red    Rows  15 reps;Red    Shoulder External Rotation  15 reps;Red    Shoulder External Rotation Limitations  supine    Horizontal ABduction  15 reps;Red    Horizontal ABduction Limitations  supine    Other Theraband Exercises  scapular protraction holding red Theraband with bilat. UE cues to keep elbows extended      Manual Therapy   Manual Therapy  Manual Traction    Soft tissue mobilization  STM in sitting bilat. upper trapezius region    Myofascial Release  suboccipital release    Manual Traction  Gentle manual cervical traction      Neck Exercises: Stretches   Upper Trapezius Stretch  Right;Left;2 reps;20 seconds    Levator Stretch  Right;Left;2 reps;20 seconds       Trigger Point Dry Needling - 11/29/19 0001    Consent Given?  Yes    Muscles Treated Head and Neck  Upper trapezius    Dry Needling Comments  needled in prone bilat. with 32 gauge 30 mm needles    Electrical Stimulation Performed with Dry Needling  Yes    E-stim with Dry Needling Details  unattended estim 20 pps x 10 min (TENS)           PT Education - 11/29/19 1049    Education Details  POC, exercises    Person(s) Educated  Patient    Methods  Explanation;Demonstration;Verbal cues;Tactile cues    Comprehension  Verbalized understanding;Returned demonstration          PT Long Term Goals - 10/26/19 1255      PT LONG TERM GOAL #1   Title  Cervical ROM WFL and without discomfort    Baseline  pain in extension and Lt rotation    Time  6    Period  Weeks    Status  New    Target Date  12/10/19      PT LONG TERM GOAL #2    Title  Resolution of HA pain    Baseline  irregular but frequent at eval    Time  6    Period  Weeks    Status  New    Target Date  12/10/19      PT LONG TERM GOAL #3   Title  Pt will be able to reach into overhead cabinets without limitation by neck/shoulder pain    Baseline  significant at eval    Time  6    Period  Weeks    Status  New    Target Date  12/10/19      PT LONG TERM GOAL #4   Title  FOTO to 31% limited    Baseline  42% limited at eval    Time  6    Period  Weeks    Status  New    Target Date  12/10/19            Plan - 11/29/19 1050    Clinical Impression Statement  Still unclear etiology more generalized pain symptoms as noted in subjective but respondong well to tx. thus far for decreased neck/suboccipital pain. Still with a fair degress of postural tension addressed today with exercises, manual therapy and dry needling. Progress re: therapy goals ongoing.    Personal Factors and Comorbidities  Comorbidity 1    Comorbidities  h/o Lt bells palsy, recent dental proceedure    Examination-Activity Limitations  Bathing;Bed Mobility;Reach Overhead;Self Feeding;Sit;Caring for Others;Carry;Sleep;Dressing;Lift;Hygiene/Grooming    Examination-Participation Restrictions  Cleaning;Laundry;Driving    Stability/Clinical Decision Making  Stable/Uncomplicated    Clinical Decision Making  Low    Rehab Potential  Good    PT Frequency  2x / week    PT Duration  6 weeks    PT Treatment/Interventions  ADLs/Self Care Home Management;Cryotherapy;Electrical Stimulation;Ultrasound;Traction;Moist Heat;Iontophoresis 4mg /ml Dexamethasone;Functional mobility training;Therapeutic activities;Therapeutic exercise;Patient/family education;Neuromuscular re-education;Manual techniques;Passive range of motion;Dry needling;Spinal Manipulations;Taping    PT Next Visit Plan  DN to suboccipitals, upper traps, scalenes PRN; periscapular strengthening, stretches, try adding median and ulnar nerve  glides/floss    PT Home Exercise Plan  upper trap stretch, scap retraction    Consulted and Agree with Plan of Care  Patient  Patient will benefit from skilled therapeutic intervention in order to improve the following deficits and impairments:  Impaired UE functional use, Increased muscle spasms, Decreased activity tolerance, Pain, Improper body mechanics, Postural dysfunction, Impaired sensation  Visit Diagnosis: Cervicalgia  Acute pain of right shoulder  Nonintractable episodic headache, unspecified headache type     Problem List Patient Active Problem List   Diagnosis Date Noted  . Martin-Gruber anastomosis ([place]) Right 11/16/2019  . Mood changes 07/28/2019  . Dyspareunia in female 07/28/2019  . Menorrhagia with regular cycle 07/28/2019  . S/P cholecystectomy 07/28/2019  . Nevus, non-neoplastic 08/06/2017  . Dermatofibroma 08/06/2017  . Seborrheic keratoses 06/12/2017  . 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 Pruritic erythematous rash 06/12/2017  . Varicose veins of left lower extremity with complications 123XX123  . Vitamin D insufficiency 08/16/2016  . Joint laxity of right knee 08/15/2016  . Chronic pain of left ankle 08/15/2016  . Chronic pain of right ankle 08/15/2016  . Fatigue 08/15/2016  . Stress incontinence, female 02/12/2016  . Breast tenderness in female 02/12/2016  . Allergic rhinitis 08/11/2015  . Snoring 08/11/2015  . Laryngopharyngeal reflux 08/11/2015    Beaulah Dinning, PT, DPT 11/29/19 11:02 AM  [place] [facility] [address] [place], [place], [address] Phone: [phone]   Fax:  [phone]  Name: Jennier Fourman MRN: [id number] Date of Birth: 1976-05-30

## 2019-12-01 ENCOUNTER — Other Ambulatory Visit: Payer: Self-pay

## 2019-12-01 ENCOUNTER — Ambulatory Visit: Payer: Self-pay | Admitting: Physical Therapy

## 2019-12-01 ENCOUNTER — Encounter: Payer: Self-pay | Admitting: Physical Therapy

## 2019-12-01 DIAGNOSIS — M25511 Pain in right shoulder: Secondary | ICD-10-CM

## 2019-12-01 DIAGNOSIS — R519 Headache, unspecified: Secondary | ICD-10-CM

## 2019-12-01 DIAGNOSIS — M542 Cervicalgia: Secondary | ICD-10-CM

## 2019-12-01 NOTE — Therapy (Signed)
[place], [place], [address] Phone: [phone]   Fax:  [phone]  Physical Therapy Treatment  Patient Details  Name: Alicia Miller MRN: [id number] Date of Birth: July 04, 1976 Referring Provider (PT): Lynne Leader, MD   Encounter Date: 12/01/2019  PT End of Session - 12/01/19 1056    Visit Number  4    Number of Visits  13    Date for PT Re-Evaluation  12/10/19    Authorization Type  CAFA 8/21-2/21/21    PT Start Time  [id number]    PT Stop Time  1053    PT Time Calculation (min)  38 min    Activity Tolerance  Patient tolerated treatment well    Behavior During Therapy  [facility] for tasks assessed/performed       Past Medical History:  Diagnosis Date  . 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 Cancer (HCC)    Cervical  . Depression   . Laryngospasm 12/10/2016  . Left-sided Bell's palsy 01/19/2016  . 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 No pertinent past medical history   . Varicose veins of left leg with edema     Past Surgical History:  Procedure Laterality Date  . CHOLECYSTECTOMY  05/2015  . ENDOVENOUS ABLATION SAPHENOUS VEIN W/ LASER Left 12/08/2017   endovenous laser ablation left greater saphenous vein and stab phlebectomy 10-20 incisions left leg by Tinnie Gens MD     There were no vitals filed for this visit.  Subjective Assessment - 12/01/19 1013    Subjective  Doing well today-no pain pre-tx. She is still noting continued    Patient is accompained by:  Interpreter   Gae Bon [id number]                      [facility] Adult PT Treatment/Exercise - 12/01/19 0001      Exercises   Exercises  Wrist   wrist flxor stretch 2x30 sec ea. bilat.     Neck Exercises: Theraband   Shoulder External Rotation  15 reps;Red    Shoulder External Rotation  Limitations  supine    Horizontal ABduction  15 reps;Red    Horizontal ABduction Limitations  supine    Other Theraband Exercises  serratus punch bilat. with 2 lb. DB 2x10      Neck Exercises: Standing   Other Standing Exercises  Median nerve glide x 10 ea. bilat.      Manual Therapy   Manual Therapy  Neural Stretch    Soft tissue mobilization  STM in sitting bilat. upper trapezius region    Myofascial Release  suboccipital release    Manual Traction  Gentle manual cervical traction    Neural Stretch  Median and ulnar nerve floss x 20 ea. bilat.      Neck Exercises: Stretches   Upper Trapezius Stretch  Right;Left;2 reps;30 seconds    Levator Stretch  Right;Left;2 reps;30 seconds                  PT Long Term Goals - 10/26/19 1255      PT LONG TERM GOAL #1   Title  Cervical ROM WFL and without discomfort    Baseline  pain in extension and Lt rotation    Time  6    Period  Weeks  Status  New    Target Date  12/10/19      PT LONG TERM GOAL #2   Title  Resolution of HA pain    Baseline  irregular but frequent at eval    Time  6    Period  Weeks    Status  New    Target Date  12/10/19      PT LONG TERM GOAL #3   Title  Pt will be able to reach into overhead cabinets without limitation by neck/shoulder pain    Baseline  significant at eval    Time  6    Period  Weeks    Status  New    Target Date  12/10/19      PT LONG TERM GOAL #4   Title  FOTO to 31% limited    Baseline  42% limited at eval    Time  6    Period  Weeks    Status  New    Target Date  12/10/19            Plan - 12/01/19 1057    Clinical Impression Statement  Improved in terms of pain but still with UE parasthesias with EMG/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 Added nerve glide/flosses today to help address neural tension. Some soreness with nerve stretches otherwise session well-tolerated. Progressing with goals re: neck/associated pain but fair status for nerve symptoms.     Personal Factors and Comorbidities  Comorbidity 1    Comorbidities  h/o Lt bells palsy, recent dental proceedure    Examination-Activity Limitations  Bathing;Bed Mobility;Reach Overhead;Self Feeding;Sit;Caring for Others;Carry;Sleep;Dressing;Lift;Hygiene/Grooming    Examination-Participation Restrictions  Cleaning;Laundry;Driving    Stability/Clinical Decision Making  Stable/Uncomplicated    Clinical Decision Making  Low    PT Frequency  2x / week    PT Duration  6 weeks    PT Treatment/Interventions  ADLs/Self Care Home Management;Cryotherapy;Electrical Stimulation;Ultrasound;Traction;Moist Heat;Iontophoresis 4mg /ml Dexamethasone;Functional mobility training;Therapeutic activities;Therapeutic exercise;Patient/family education;Neuromuscular re-education;Manual techniques;Passive range of motion;Dry needling;Spinal Manipulations;Taping    PT Next Visit Plan  DN to suboccipitals, upper traps, scalenes PRN; periscapular strengthening, stretches,check response median and ulnar nerve glides/floss    PT Home Exercise Plan  Cervical retractions, Median and ulnar nerve glides, theraband row, ext, horizontal abduction, wrist flexor stretch    Consulted and Agree with Plan of Care  Patient       Patient will benefit from skilled therapeutic intervention in order to improve the following deficits and impairments:  Impaired UE functional use, Increased muscle spasms, Decreased activity tolerance, Pain, Improper body mechanics, Postural dysfunction, Impaired sensation  Visit Diagnosis: Cervicalgia  Acute pain of right shoulder  Nonintractable episodic headache, unspecified headache type     Problem List Patient Active Problem List   Diagnosis Date Noted  . Martin-Gruber anastomosis ([place]) Right 11/16/2019  . Mood changes 07/28/2019  . Dyspareunia in female 07/28/2019  . Menorrhagia with regular cycle 07/28/2019  . S/P cholecystectomy 07/28/2019  . Nevus, non-neoplastic 08/06/2017  .  Dermatofibroma 08/06/2017  . Seborrheic keratoses 06/12/2017  . 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 Pruritic erythematous rash 06/12/2017  . Varicose veins of left lower extremity with complications 123XX123  . Vitamin D insufficiency 08/16/2016  . Joint laxity of right knee 08/15/2016  . Chronic pain of left ankle 08/15/2016  . Chronic pain of right ankle 08/15/2016  . Fatigue 08/15/2016  . Stress incontinence, female 02/12/2016  . Breast tenderness in female 02/12/2016  . Allergic rhinitis 08/11/2015  .  Snoring 08/11/2015  . Laryngopharyngeal reflux 08/11/2015    Beaulah Dinning, PT, DPT 12/01/19 11:00 AM  [facility] [address] [place], [place], [address] Phone: [phone]   Fax:  [phone]  Name: Kiyanne Om MRN: [id number] Date of Birth: 24-Oct-1976

## 2019-12-02 ENCOUNTER — Other Ambulatory Visit: Payer: Self-pay

## 2019-12-03 ENCOUNTER — Ambulatory Visit: Payer: No Typology Code available for payment source | Admitting: Specialist

## 2019-12-03 ENCOUNTER — Other Ambulatory Visit: Payer: Self-pay

## 2019-12-06 ENCOUNTER — Encounter: Payer: Self-pay | Admitting: Physical Therapy

## 2019-12-06 ENCOUNTER — Ambulatory Visit: Payer: Self-pay | Admitting: Physical Therapy

## 2019-12-06 ENCOUNTER — Other Ambulatory Visit: Payer: Self-pay

## 2019-12-06 DIAGNOSIS — M25511 Pain in right shoulder: Secondary | ICD-10-CM

## 2019-12-06 DIAGNOSIS — R519 Headache, unspecified: Secondary | ICD-10-CM

## 2019-12-06 DIAGNOSIS — M542 Cervicalgia: Secondary | ICD-10-CM

## 2019-12-06 NOTE — Therapy (Signed)
[place], [place], [address] Phone: [phone]   Fax:  [phone]  Physical Therapy Treatment  Patient Details  Name: Alicia Miller MRN: [id number] Date of Birth: 30-Sep-1976 Referring Provider (PT): Lynne Leader, MD   Encounter Date: 12/06/2019  PT End of Session - 12/06/19 1046    Visit Number  5    Number of Visits  13    Date for PT Re-Evaluation  12/10/19    Authorization Type  CAFA 8/21-2/21/21    PT Start Time  [id number]    PT Stop Time  1054    PT Time Calculation (min)  39 min    Activity Tolerance  Patient tolerated treatment well    Behavior During Therapy  [facility] for tasks assessed/performed       Past Medical History:  Diagnosis Date  . 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 Cancer (HCC)    Cervical  . Depression   . Laryngospasm 12/10/2016  . Left-sided Bell's palsy 01/19/2016  . 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 No pertinent past medical history   . Varicose veins of left leg with edema     Past Surgical History:  Procedure Laterality Date  . CHOLECYSTECTOMY  05/2015  . ENDOVENOUS ABLATION SAPHENOUS VEIN W/ LASER Left 12/08/2017   endovenous laser ablation left greater saphenous vein and stab phlebectomy 10-20 incisions left leg by Tinnie Gens MD     There were no vitals filed for this visit.  Subjective Assessment - 12/06/19 1019    Subjective  Had some soreness with nerve glides now resolved. Mild soreness in suboccipital region.    Patient is accompained by:  Interpreter   Lorriane Shire # [id number]   Currently in Pain?  Yes    Pain Location  Neck    Pain Orientation  Posterior;Upper    Pain Descriptors / Indicators  Sore    Aggravating Factors   stress    Pain Relieving Factors  medication                       [facility] Adult PT  Treatment/Exercise - 12/06/19 0001      Shoulder Exercises: Supine   Other Supine Exercises  serratus punch 2 lb. 2x10 bilat., Theraband bilat. ER and horizontal abduction red band x 15 ea.      Manual Therapy   Manual Therapy  Joint mobilization    Joint Mobilization  Thoracic PAs grade I-III    Soft tissue mobilization  cervical paraspinals right side focus in prone    Myofascial Release  suboccipital release    Manual Traction  Gentle manual cervical traction      Neck Exercises: Stretches   Upper Trapezius Stretch  Right;Left;2 reps;30 seconds    Levator Stretch  Right;Left;2 reps;30 seconds       Trigger Point Dry Needling - 12/06/19 0001    Consent Given?  Yes    Muscles Treated Head and Neck  Upper trapezius;Semispinalis capitus    Dry Needling Comments  needling in prone with 30 gauge 30 mm needles    Electrical Stimulation Performed with Dry Needling  Yes    E-stim with Dry Needling Details  TENS 20 pps x 10 minutes           PT Education - 12/06/19 1046  Education Details  POC    Person(s) Educated  Patient    Methods  Explanation    Comprehension  Verbalized understanding          PT Long Term Goals - 10/26/19 1255      PT LONG TERM GOAL #1   Title  Cervical ROM WFL and without discomfort    Baseline  pain in extension and Lt rotation    Time  6    Period  Weeks    Status  New    Target Date  12/10/19      PT LONG TERM GOAL #2   Title  Resolution of HA pain    Baseline  irregular but frequent at eval    Time  6    Period  Weeks    Status  New    Target Date  12/10/19      PT LONG TERM GOAL #3   Title  Pt will be able to reach into overhead cabinets without limitation by neck/shoulder pain    Baseline  significant at eval    Time  6    Period  Weeks    Status  New    Target Date  12/10/19      PT LONG TERM GOAL #4   Title  FOTO to 31% limited    Baseline  42% limited at eval    Time  6    Period  Weeks    Status  New    Target Date   12/10/19            Plan - 12/06/19 1047    Clinical Impression Statement  Mild setback with local right cervical soreness today but overall still improving in terms of neck pain. UE parasthesias with underlying carpal tunnel syndrome and ulnar neuropathy ongoing. See plan.    Personal Factors and Comorbidities  Comorbidity 1    Comorbidities  h/o Lt bells palsy, recent dental proceedure    Examination-Activity Limitations  Bathing;Bed Mobility;Reach Overhead;Self Feeding;Sit;Caring for Others;Carry;Sleep;Dressing;Lift;Hygiene/Grooming    Examination-Participation Restrictions  Cleaning;Laundry;Driving    Stability/Clinical Decision Making  Stable/Uncomplicated    Clinical Decision Making  Low    Rehab Potential  Good    PT Frequency  2x / week    PT Duration  6 weeks    PT Treatment/Interventions  ADLs/Self Care Home Management;Cryotherapy;Electrical Stimulation;Ultrasound;Traction;Moist Heat;Iontophoresis 4mg /ml Dexamethasone;Functional mobility training;Therapeutic activities;Therapeutic exercise;Patient/family education;Neuromuscular re-education;Manual techniques;Passive range of motion;Dry needling;Spinal Manipulations;Taping    PT Next Visit Plan  ERO next session-if cervical pain sufficiently improved possible d/c with pt. to follow up with MD for tx. options for neuropathy symptoms vs. recert. to continue, further dry needling, manual, postural/periscapular stabilization and strengthening, stretches    PT Home Exercise Plan  Cervical retractions, Median and ulnar nerve glides, theraband row, ext, horizontal abduction, wrist flexor stretch    Consulted and Agree with Plan of Care  Patient       Patient will benefit from skilled therapeutic intervention in order to improve the following deficits and impairments:  Impaired UE functional use, Increased muscle spasms, Decreased activity tolerance, Pain, Improper body mechanics, Postural dysfunction, Impaired sensation  Visit  Diagnosis: Cervicalgia  Acute pain of right shoulder  Nonintractable episodic headache, unspecified headache type     Problem List Patient Active Problem List   Diagnosis Date Noted  . Martin-Gruber anastomosis ([place]) Right 11/16/2019  . Mood changes 07/28/2019  . Dyspareunia in female 07/28/2019  . Menorrhagia with regular cycle 07/28/2019  .  S/P cholecystectomy 07/28/2019  . Nevus, non-neoplastic 08/06/2017  . Dermatofibroma 08/06/2017  . Seborrheic keratoses 06/12/2017  . 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 Pruritic erythematous rash 06/12/2017  . Varicose veins of left lower extremity with complications [id number]  . Vitamin D insufficiency 08/16/2016  . Joint laxity of right knee 08/15/2016  . Chronic pain of left ankle 08/15/2016  . Chronic pain of right ankle 08/15/2016  . Fatigue 08/15/2016  . Stress incontinence, female 02/12/2016  . Breast tenderness in female 02/12/2016  . Allergic rhinitis 08/11/2015  . Snoring 08/11/2015  . Laryngopharyngeal reflux 08/11/2015    Beaulah Dinning, PT, DPT 12/06/19 10:49 AM  [facility] [address] [place], [place], [address] Phone: [phone]   Fax:  [phone]  Name: Alicia Miller MRN: [id number] Date of Birth: 1976/09/27

## 2019-12-08 ENCOUNTER — Encounter: Payer: Self-pay | Admitting: Physical Therapy

## 2019-12-08 ENCOUNTER — Other Ambulatory Visit: Payer: Self-pay

## 2019-12-08 ENCOUNTER — Ambulatory Visit: Payer: Self-pay | Admitting: Physical Therapy

## 2019-12-08 DIAGNOSIS — M542 Cervicalgia: Secondary | ICD-10-CM

## 2019-12-08 DIAGNOSIS — M25511 Pain in right shoulder: Secondary | ICD-10-CM

## 2019-12-08 DIAGNOSIS — R519 Headache, unspecified: Secondary | ICD-10-CM

## 2019-12-08 NOTE — Therapy (Signed)
[place], [place], [address] Phone: [phone]   Fax:  [phone]  Physical Therapy Treatment/Discharge  Patient Details  Name: Alicia Miller MRN: [id number] Date of Birth: 06/07/1976 Referring Provider (PT): Lynne Leader, MD   Encounter Date: 12/08/2019  PT End of Session - 12/08/19 1152    Visit Number  6    Number of Visits  13    Date for PT Re-Evaluation  12/10/19    Authorization Type  CAFA 8/21-2/21/21    PT Start Time  1150    PT Stop Time  1214    PT Time Calculation (min)  24 min    Activity Tolerance  Patient tolerated treatment well    Behavior During Therapy  [facility] for tasks assessed/performed       Past Medical History:  Diagnosis Date  . 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 Cancer (HCC)    Cervical  . Depression   . Laryngospasm 12/10/2016  . Left-sided Bell's palsy 01/19/2016  . 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 No pertinent past medical history   . Varicose veins of left leg with edema     Past Surgical History:  Procedure Laterality Date  . CHOLECYSTECTOMY  05/2015  . ENDOVENOUS ABLATION SAPHENOUS VEIN W/ LASER Left 12/08/2017   endovenous laser ablation left greater saphenous vein and stab phlebectomy 10-20 incisions left leg by Tinnie Gens MD     There were no vitals filed for this visit.  Subjective Assessment - 12/08/19 1153    Subjective  The head pain is better, I have not gotten any more headaches. The hand numbness is very little, I hardly feel it any more. My hands feel stronger when washing the dishes. I have only had low back pain but it is every night.    Patient Stated Goals  decrease pain neck seems to move okay, arm is hard to move- chronic on Lt    Currently in Pain?  No/denies         [facility] PT Assessment -  12/08/19 0001      Sensation   Additional Comments  very minimal N/T      Posture/Postural Control   Posture Comments  Rt GHJ & scapular depression      AROM   Cervical Flexion  40   some discomfort   Cervical Extension  55   some discomfort   Cervical - Right Side Bend  35    Cervical - Left Side Bend  38    Cervical - Right Rotation  52    Cervical - Left Rotation  52      Palpation   Palpation comment  TTP bil upper traps                           PT Education - 12/08/19 1218    Education Details  goals, importance of continued HEP    Person(s) Educated  Patient    Methods  Explanation    Comprehension  Verbalized understanding          PT Long Term Goals - 12/08/19 1159      PT LONG TERM GOAL #1   Title  Cervical ROM WFL and without discomfort    Baseline  mild discomfort in flexion &  extension    Status  Partially Met      PT LONG TERM GOAL #2   Title  Resolution of HA pain    Status  Achieved      PT LONG TERM GOAL #3   Title  Pt will be able to reach into overhead cabinets without limitation by neck/shoulder pain    Status  Achieved      PT LONG TERM GOAL #4   Title  FOTO to 31% limited    Baseline  35% limited, improved from eval    Status  Not Met            Plan - 12/08/19 1215    Clinical Impression Statement  Pt has made signficant progress since beginnig PT and reports she rarely ever feels HA or neck pain any more and N/T in hands has improved as has her strength. She still has some tenderness in her upper traps and I told her regular massage appointments would be helpful to control the general tension and stress. Pt verbalized readiness for d/c to independent program and I asked her to contact us with any further questions.    PT Treatment/Interventions  ADLs/Self Care Home Management;Cryotherapy;Electrical Stimulation;Ultrasound;Traction;Moist Heat;Iontophoresis '4mg'$ /ml Dexamethasone;Functional mobility  training;Therapeutic activities;Therapeutic exercise;Patient/family education;Neuromuscular re-education;Manual techniques;Passive range of motion;Dry needling;Spinal Manipulations;Taping    PT Home Exercise Plan  Cervical retractions, Median and ulnar nerve glides, theraband row, ext, horizontal abduction, wrist flexor stretch    Consulted and Agree with Plan of Care  Patient       Patient will benefit from skilled therapeutic intervention in order to improve the following deficits and impairments:  Impaired UE functional use, Increased muscle spasms, Decreased activity tolerance, Pain, Improper body mechanics, Postural dysfunction, Impaired sensation  Visit Diagnosis: Cervicalgia  Acute pain of right shoulder  Nonintractable episodic headache, unspecified headache type     Problem List Patient Active Problem List   Diagnosis Date Noted  . Martin-Gruber anastomosis ([place]) Right 11/16/2019  . Mood changes 07/28/2019  . Dyspareunia in female 07/28/2019  . Menorrhagia with regular cycle 07/28/2019  . S/P cholecystectomy 07/28/2019  . Nevus, non-neoplastic 08/06/2017  . Dermatofibroma 08/06/2017  . Seborrheic keratoses 06/12/2017  . 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 Pruritic erythematous rash 06/12/2017  . Varicose veins of left lower extremity with complications 80/01/4916  . Vitamin D insufficiency 08/16/2016  . Joint laxity of right knee 08/15/2016  . Chronic pain of left ankle 08/15/2016  . Chronic pain of right ankle 08/15/2016  . Fatigue 08/15/2016  . Stress incontinence, female 02/12/2016  . Breast tenderness in female 02/12/2016  . Allergic rhinitis 08/11/2015  . Snoring 08/11/2015  . Laryngopharyngeal reflux 08/11/2015    PHYSICAL THERAPY DISCHARGE SUMMARY  Visits from Start of Care: 6  Current functional level related to goals / functional outcomes: See above   Remaining deficits: See above   Education / Equipment: Anatomy of condition, POC, HEP,  exercise form/rationale  Plan: Patient agrees to discharge.  Patient goals were partially met. Patient is being discharged due to being pleased with the current functional level.  ?????     Arrian Manson C. Nocholas Damaso PT, DPT 12/08/19 12:18 PM   [facility] [address] [place], [place], [address] Phone: [phone]   Fax:  [phone]  Name: Alicia Miller MRN: [id number] Date of Birth: 27-Apr-1976

## 2019-12-09 ENCOUNTER — Ambulatory Visit (INDEPENDENT_AMBULATORY_CARE_PROVIDER_SITE_OTHER): Payer: Self-pay | Admitting: Family Medicine

## 2019-12-09 ENCOUNTER — Encounter: Payer: Self-pay | Admitting: Family Medicine

## 2019-12-09 VITALS — BP 133/76 | HR 87 | Wt 142.0 lb

## 2019-12-09 DIAGNOSIS — N920 Excessive and frequent menstruation with regular cycle: Secondary | ICD-10-CM

## 2019-12-09 MED ORDER — DOXYCYCLINE HYCLATE 100 MG PO CAPS: 100.0000 mg | ORAL_CAPSULE | Freq: Two times a day (BID) | ORAL | 0 refills | Status: DC

## 2019-12-09 MED FILL — DOXYCYCLINE HYCLATE 100 MG: 100 | 10 days supply | Qty: 20 | Fill #0

## 2019-12-09 NOTE — Patient Instructions (Signed)
Menorragia Menorrhagia Se llama menorragia cuando sus perodos menstruales son muy abundantes o duran ms de lo normal. Siga estas indicaciones en su casa: Medicamentos   [employer] de venta libre y los recetados exactamente como se lo haya indicado el mdico. Esto incluye pldoras de suplemento de hierro.  No cambie ni reemplace los medicamentos sin consultarlo con su mdico.  No tome aspirina ni medicamentos que contengan aspirina a partir de 1semana antes ni durante su perodo. La aspirina puede hacer que la hemorragia empeore. Instrucciones generales  Si necesita cambiar el apsito o el tampn ms de una vez cada 2horas, limite su actividad hasta que el sangrado se [place].  Las pldoras de hierro pueden causar problemas para Landscape architect (estreimiento). Para prevenir o tratar el estreimiento mientras toma pldoras de hierro recetadas, su mdico puede sugerirle lo siguiente: ? Beber suficiente lquido como para mantener el pis (orina) claro o de color amarillo plido. ? Tomar medicamentos recetados o de [employer]. ? Comer alimentos ricos en fibra. Estos incluyen los siguientes:  Frutas y verduras frescas.  Cereales integrales.  Frijoles. ? Limitar el consumo de alimentos ricos en grasa y azcares procesados. Estos incluyen los alimentos fritos y dulces.  Comer comidas saludables y alimentos ricos en hierro. Los alimentos que tienen mucho hierro [employer] siguientes: ? Verduras de [employer]. ? Carne. ? Hgado. ? Huevos. ? Panes y cereales integrales.  No intente adelgazar hasta que el sangrado abundante se haya detenido y tenga cantidades normales de hierro en la sangre. Si necesita adelgazar, consulte a su mdico.  Concurra a todas las visitas de control como se lo haya indicado el mdico. Esto es importante. Comunquese con un mdico si:  Empapa un tampn o un apsito cada 1 o 2 horas, y [employer] ocurre cada vez que tiene el perodo.  Necesita usar  apsitos y tampones al mismo tiempo porque pierde [employer].  Si est tomando medicamentos y tiene lo siguiente: ? Higher education careers adviser (nuseas). ? Vmitos. ? Materia fecal es lquida (diarrea).  Algn problema que pueda relacionarse con el medicamento que est tomando. Solicite ayuda de inmediato si:  Empapa ms de un apsito o un tampn en 1 hora.  Elimina cogulos ms grandes que 1 pulgada (2,5 cm).  Le falta el aire.  Siente que el corazn late [place] rpido.  Se siente mareado o se desvanece (se desmaya).  Se siente muy dbil o cansada. Resumen  Se llama menorragia cuando sus perodos menstruales son muy abundantes o duran ms de lo normal.  [employer] de venta libre y los recetados exactamente como se lo haya indicado el mdico. Esto incluye pldoras de suplemento de hierro.  Comunquese con un mdico si empapa ms de un apsito o un tampn en 1hora, o si elimina cogulos grandes. Esta informacin no tiene Marine scientist el consejo del mdico. Asegrese de hacerle al mdico cualquier pregunta que tenga. Document Revised: 07/31/2017 Document Reviewed: 05/06/2013 Elsevier Patient Education  [place].

## 2019-12-09 NOTE — Progress Notes (Signed)
   Subjective:    Patient ID: Alicia Miller is a 44 y.o. female presenting with No chief complaint on file.  on 12/09/2019  HPI: Notes cycles are regular and have gotten heavier over last 3 months. Feels tired and exhausted during this time. Has f/h uterine cancer.  Review of Systems  Constitutional: Negative for chills and fever.  Respiratory: Negative for shortness of breath.   Cardiovascular: Negative for chest pain.  Gastrointestinal: Negative for abdominal pain, nausea and vomiting.  Genitourinary: Negative for dysuria.  Skin: Negative for rash.      Objective:    BP 133/76   Pulse 87   Wt 142 lb (64.4 kg)   BMI 25.97 kg/m  Physical Exam Constitutional:      General: She is not in acute distress.    Appearance: She is well-developed.  HENT:     Head: Normocephalic and atraumatic.  Eyes:     General: No scleral icterus. Cardiovascular:     Rate and Rhythm: Normal rate.  Pulmonary:     Effort: Pulmonary effort is normal.  Abdominal:     Palpations: Abdomen is soft.  Musculoskeletal:     Cervical back: Neck supple.  Skin:    General: Skin is warm and dry.  Neurological:     Mental Status: She is alert and oriented to person, place, and time.         Assessment & Plan:   Problem List Items Addressed This Visit      Unprioritized   Menorrhagia with regular cycle - Primary    Check labs, u/s and trial of 2 wks of Doxy--      Relevant Medications   doxycycline (VIBRAMYCIN) 100 MG capsule   Other Relevant Orders   US PELVIC COMPLETE WITH TRANSVAGINAL   Follicle stimulating hormone   TSH   CBC      Total face-to-face time with patient: 20 minutes. Over 50% of encounter was spent on counseling and coordination of care. Return in about 3 months (around 03/08/2020) for needs U/S, in person.  Donnamae Jude 12/09/2019 5:14 PM

## 2019-12-09 NOTE — Assessment & Plan Note (Signed)
Check labs, u/s and trial of 2 wks of Doxy--

## 2019-12-09 NOTE — Progress Notes (Signed)
Bleeding is heavier  Periods are coming regular jjust bleeding heavier / been going on 3 months now.

## 2019-12-10 ENCOUNTER — Ambulatory Visit
Admission: RE | Admit: 2019-12-10 | Discharge: 2019-12-10 | Disposition: A | Discharge status: Home / Self Care | Payer: No Typology Code available for payment source | Source: Ambulatory Visit | Attending: Surgery | Admitting: Surgery

## 2019-12-10 ENCOUNTER — Other Ambulatory Visit: Payer: Self-pay

## 2019-12-10 DIAGNOSIS — M5412 Radiculopathy, cervical region: Secondary | ICD-10-CM

## 2019-12-10 LAB — FOLLICLE STIMULATING HORMONE: FSH: 1.9 m[IU]/mL

## 2019-12-10 LAB — CBC
Hematocrit: 43.6 % (ref 34.0–46.6)
Hemoglobin: 15 g/dL (ref 11.1–15.9)
MCH: 28.7 pg (ref 26.6–33.0)
MCHC: 34.4 g/dL (ref 31.5–35.7)
MCV: 83 fL (ref 79–97)
Platelets: 447 10*3/uL (ref 150–450)
RBC: 5.23 x10E6/uL (ref 3.77–5.28)
RDW: 12.3 % (ref 11.7–15.4)
WBC: 8.1 10*3/uL (ref 3.4–10.8)

## 2019-12-10 LAB — TSH: TSH: 2.22 u[IU]/mL (ref 0.450–4.500)

## 2019-12-14 ENCOUNTER — Other Ambulatory Visit: Payer: Self-pay

## 2019-12-14 ENCOUNTER — Ambulatory Visit (HOSPITAL_COMMUNITY)
Admission: RE | Admit: 2019-12-14 | Discharge: 2019-12-14 | Disposition: A | Discharge status: Home / Self Care | Payer: Self-pay | Source: Ambulatory Visit | Attending: Family Medicine | Admitting: Family Medicine

## 2019-12-14 DIAGNOSIS — N920 Excessive and frequent menstruation with regular cycle: Secondary | ICD-10-CM | POA: Insufficient documentation

## 2019-12-17 ENCOUNTER — Encounter: Payer: Self-pay | Admitting: Specialist

## 2019-12-17 ENCOUNTER — Other Ambulatory Visit: Payer: Self-pay

## 2019-12-17 ENCOUNTER — Ambulatory Visit (INDEPENDENT_AMBULATORY_CARE_PROVIDER_SITE_OTHER): Payer: Self-pay | Admitting: Specialist

## 2019-12-17 ENCOUNTER — Ambulatory Visit (INDEPENDENT_AMBULATORY_CARE_PROVIDER_SITE_OTHER): Payer: Self-pay

## 2019-12-17 VITALS — BP 110/72 | HR 79 | Ht 62.0 in | Wt 146.0 lb

## 2019-12-17 DIAGNOSIS — M5441 Lumbago with sciatica, right side: Secondary | ICD-10-CM

## 2019-12-17 DIAGNOSIS — I776 Arteritis, unspecified: Secondary | ICD-10-CM

## 2019-12-17 DIAGNOSIS — G5601 Carpal tunnel syndrome, right upper limb: Secondary | ICD-10-CM

## 2019-12-17 DIAGNOSIS — M5442 Lumbago with sciatica, left side: Secondary | ICD-10-CM

## 2019-12-17 DIAGNOSIS — M5136 Other intervertebral disc degeneration, lumbar region: Secondary | ICD-10-CM

## 2019-12-17 DIAGNOSIS — M4722 Other spondylosis with radiculopathy, cervical region: Secondary | ICD-10-CM

## 2019-12-17 DIAGNOSIS — G5622 Lesion of ulnar nerve, left upper limb: Secondary | ICD-10-CM

## 2019-12-17 DIAGNOSIS — C530 Malignant neoplasm of endocervix: Secondary | ICD-10-CM

## 2019-12-17 DIAGNOSIS — G5621 Lesion of ulnar nerve, right upper limb: Secondary | ICD-10-CM

## 2019-12-17 DIAGNOSIS — G5602 Carpal tunnel syndrome, left upper limb: Secondary | ICD-10-CM

## 2019-12-17 NOTE — Progress Notes (Signed)
Office Visit Note   Patient: Alicia Miller           Date of Birth: 01-20-1976           MRN: [id number] Visit Date: 12/17/2019              Requested by: Antony Blackbird, MD [place],  [place] [address] PCP: Antony Blackbird, MD   Assessment & Plan: Visit Diagnoses:  1. Carpal tunnel syndrome, right upper limb   2. Cubital tunnel syndrome on right   3. Carpal tunnel syndrome, left upper limb   4. Cubital tunnel syndrome, left   5. Other spondylosis with radiculopathy, cervical region   6. Acute bilateral low back pain with bilateral sciatica   7. Vasculitis ([place])   8. Endocervical adenocarcinoma (HCC) Chronic  9. Degenerative disc disease, lumbar     Plan: Avoid frequent bending and stooping  No lifting greater than 15 lbs. May use ice or moist heat for pain. Weight loss is of benefit. Best medication for lumbar disc disease is arthritis medications like motrin, celebrex and naprosyn. Exercise is important to improve your indurance and does allow people to function better inspite of back pain.  Avoid overhead lifting and overhead use of the arms. Do not lift greater than 15 lbs. Adjust head rest in vehicle to prevent hyperextension if rear ended. Referral to Dr. Thresa Ross Neurology for assessment of left arm for CTS and cubital tunnel with EMG/NCV, Referral to Physical therapy for treatment of her lumbar degenerative disc disease and cervical spondylosis.   Follow-Up Instructions: Return in about 4 weeks (around 01/14/2020).   Orders:  Orders Placed This Encounter  Procedures  . XR Lumbar Spine 2-3 Views   No orders of the defined types were placed in this encounter.     Procedures: No procedures performed   Clinical Data: No additional findings.   Subjective: Chief Complaint  Patient presents with  . Neck - Follow-up    MRI Review    19 right handed female with history of bilateral UE numbness and tingling. She has had lower  extremity symptoms also but  Recent MRI of the cervical spine is negative for any nerve compression. The results of EMG/NCV by Dr. Posey Pronto 11/02/2019 suggest severe right carpal tunnel syndrome findings of Martin-Gruber anastamosis and right ulnar nerve compression at the right elbow. She is still having symptoms of nocturnal numbness and tingling and some weakness in the hands and has  Catching and fatigue using her hand. Has some low back pain with prolong standing and walking. She has pain in the bottoms of her feet with burning sensation for two months about 3 weeks ago. She has pain the mid lumbar present with lying down and Is not really related to any activity. She notes pain in the lumber pain that some time keeps her awake at night, it is worse during menses. She reports the back pain is worse than pain she is having else where. She would like to discuss the findings in the arm and with her family decide on best time frame to consider intervention.  History of endocervical carcinoma or cervix apparently this is in remission.   Review of Systems  Constitutional: Negative.  Negative for activity change, appetite change, chills, diaphoresis, fatigue, fever and unexpected weight change.  HENT: Positive for dental problem. Negative for congestion, drooling, ear discharge, ear pain, facial swelling, hearing loss, mouth sores, nosebleeds, postnasal drip, rhinorrhea, sinus pressure, sinus pain, sneezing,  sore throat, tinnitus, trouble swallowing and voice change.   Eyes: Negative.  Negative for photophobia, pain, discharge, redness, itching and visual disturbance.  Respiratory: Negative.  Negative for apnea, cough, choking, chest tightness, shortness of breath, wheezing and stridor.   Cardiovascular: Positive for leg swelling. Negative for chest pain and palpitations.  Gastrointestinal: Negative.  Negative for abdominal distention, abdominal pain, anal bleeding, blood in stool, constipation, diarrhea,  nausea, rectal pain and vomiting.  Endocrine: Positive for polyuria. Negative for cold intolerance, heat intolerance, polydipsia and polyphagia.  Genitourinary: Negative.  Negative for difficulty urinating, dyspareunia, dysuria, enuresis, flank pain, frequency, genital sores, hematuria, menstrual problem, pelvic pain and urgency.  Musculoskeletal: Positive for back pain. Negative for arthralgias, gait problem, joint swelling, myalgias, neck pain and neck stiffness.  Skin: Negative.   Allergic/Immunologic: Negative.  Negative for environmental allergies, food allergies and immunocompromised state.  Neurological: Positive for weakness and numbness. Negative for dizziness, tremors, seizures, syncope, facial asymmetry, speech difficulty, light-headedness and headaches.  Hematological: Negative for adenopathy. Does not bruise/bleed easily.  Psychiatric/Behavioral: Negative.  Negative for agitation, behavioral problems, confusion, decreased concentration, dysphoric mood, hallucinations, self-injury, sleep disturbance and suicidal ideas. The patient is not nervous/anxious and is not hyperactive.      Objective: Vital Signs: BP 110/72 (BP Location: Left Arm, Patient Position: Sitting)   Pulse 79   Ht 5\' 2"  (1.575 m)   Wt 146 lb (66.2 kg)   BMI 26.70 kg/m   Physical Exam Constitutional:      Appearance: She is well-developed.  HENT:     Head: Normocephalic and atraumatic.  Eyes:     Pupils: Pupils are equal, round, and reactive to light.  Pulmonary:     Effort: Pulmonary effort is normal.     Breath sounds: Normal breath sounds.  Abdominal:     General: Bowel sounds are normal.     Palpations: Abdomen is soft.  Musculoskeletal:     Cervical back: Normal range of motion and neck supple.     Lumbar back: Negative right straight leg raise test and negative left straight leg raise test.  Skin:    General: Skin is warm and dry.  Neurological:     Mental Status: She is alert and oriented to  person, place, and time.  Psychiatric:        Behavior: Behavior normal.        Thought Content: Thought content normal.        Judgment: Judgment normal.     Back Exam   Tenderness  The patient is experiencing tenderness in the cervical and lumbar.  Range of Motion  Extension: abnormal  Flexion: abnormal  Lateral bend right: abnormal  Lateral bend left: abnormal  Rotation right: abnormal  Rotation left: abnormal   Muscle Strength  Right Quadriceps:  5/5  Left Quadriceps:  5/5  Right Hamstrings:  5/5  Left Hamstrings:  5/5   Tests  Straight leg raise right: negative Straight leg raise left: negative  Reflexes  Patellar: 2/4 Babinski's sign: normal   Other  Toe walk: normal Heel walk: normal Sensation: decreased Gait: normal  Erythema: no back redness Scars: absent   Right Hand Exam   Tenderness  The patient is experiencing tenderness in the palmar area.  Range of Motion  Wrist  Extension: abnormal  Flexion: abnormal  Pronation: abnormal  Supination: abnormal   Muscle Strength  Wrist extension: 5/5  Wrist flexion: 5/5  Grip: 5/5   Tests  Phalen's sign: positive Tinel's sign (median  nerve): positive   Left Hand Exam   Tenderness  The patient is experiencing tenderness in the palmar area.   Range of Motion  Wrist  Extension: abnormal  Flexion: abnormal  Pronation: abnormal  Supination: abnormal   Muscle Strength  Wrist extension: 5/5  Wrist flexion: 5/5  Grip:  5/5   Tests  Phalen's sign: positive Tinel's sign (median nerve): positive   Right Elbow Exam   Tenderness  The patient is experiencing tenderness in the medial epicondyle.   Tests  Tinel's sign (cubital tunnel): positive   Left Elbow Exam   Tenderness  The patient is experiencing tenderness in the medial epicondyle.   Tests  Tinel's sign (cubital tunnel): positive      Specialty Comments:  No specialty comments available.  Imaging: XR Lumbar Spine  2-3 Views  Result Date: 12/17/2019 AP and lateral flexion and extension radiographs show small limbus anterior superior lip of L4 the disc heights well maintained through out the lumbar spine. Minimal 2.5 degree left curve thoracolumbar. SI and hip joints are well maintained with minimal sclerosis of the inferior SI joint.Marland Kitchen    PMFS History: Patient Active Problem List   Diagnosis Date Noted  . Vasculitis ([place]) 12/17/2019  . 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 Mood changes 07/28/2019  . Dyspareunia in female 07/28/2019  . Menorrhagia with regular cycle 07/28/2019  . S/P cholecystectomy 07/28/2019  . Nevus, non-neoplastic 08/06/2017  . Dermatofibroma 08/06/2017  . Seborrheic keratoses 06/12/2017  . 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 Pruritic erythematous rash 06/12/2017  . Varicose veins of left lower extremity with complications [id number]  . Vitamin D insufficiency 08/16/2016  . Joint laxity of right knee 08/15/2016  . Chronic pain of left ankle 08/15/2016  . Chronic pain of right ankle 08/15/2016  . Fatigue 08/15/2016  . Stress incontinence, female 02/12/2016  . Breast tenderness in female 02/12/2016  . Allergic rhinitis 08/11/2015  . Snoring 08/11/2015  . Laryngopharyngeal reflux 08/11/2015   Past Medical History:  Diagnosis Date  . 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 Cancer (HCC)    Cervical  . Depression   . Laryngospasm 12/10/2016  . Left-sided Bell's palsy 01/19/2016  . Martin-Gruber anastomosis [facility]) Right 11/16/2019   Noted on EMG Dec 2020  . No pertinent past medical history   . Varicose veins of left leg with edema     Family History  Problem Relation Age of Onset  . Diabetes Father   . Cancer Sister        uterine  . Diabetes Sister   . Diabetes Paternal Grandmother   . Diabetes Paternal Uncle   . Diabetes Paternal Uncle   . Diabetes  Paternal Uncle   . Diabetes Paternal Uncle   . Diabetes Paternal Uncle   . Diabetes Paternal Uncle     Past Surgical History:  Procedure Laterality Date  . CHOLECYSTECTOMY  05/2015  . ENDOVENOUS ABLATION SAPHENOUS VEIN W/ LASER Left 12/08/2017   endovenous laser ablation left greater saphenous vein and stab phlebectomy 10-20 incisions left leg by Tinnie Gens MD    Social History   Occupational History  . Not on file  Tobacco Use  . Smoking status: Never Smoker  . Smokeless tobacco: Never Used  Substance and Sexual Activity  . Alcohol use: No  . Drug use: No  . Sexual activity: Yes    Birth  control/protection: None, Other-see comments    Comment: withdrawal method

## 2019-12-17 NOTE — Patient Instructions (Signed)
Avoid frequent bending and stooping  No lifting greater than 15 lbs. May use ice or moist heat for pain. Weight loss is of benefit. Best medication for lumbar disc disease is arthritis medications like motrin, celebrex and naprosyn. Exercise is important to improve your indurance and does allow people to function better inspite of back pain.  Avoid overhead lifting and overhead use of the arms. Do not lift greater than 15 lbs. Adjust head rest in vehicle to prevent hyperextension if rear ended. Referral to Dr. Thresa Ross Neurology for assessment of left arm for CTS and cubital tunnel with EMG/NCV, Referral to Physical therapy for treatment of her lumbar degenerative disc disease and cervical spondylosis.

## 2020-01-03 ENCOUNTER — Ambulatory Visit: Payer: No Typology Code available for payment source | Admitting: Physical Therapy

## 2020-01-14 ENCOUNTER — Ambulatory Visit: Payer: Self-pay | Admitting: Family Medicine

## 2020-01-26 ENCOUNTER — Ambulatory Visit: Payer: Self-pay | Attending: Family Medicine

## 2020-01-26 ENCOUNTER — Other Ambulatory Visit: Payer: Self-pay

## 2020-02-03 NOTE — Progress Notes (Deleted)
NEUROLOGY FOLLOW UP OFFICE NOTE  Sherene [place] [id number]  HISTORY OF PRESENT ILLNESS: Alicia Miller is a 44 year old female with depression and history of left-sided Bell's palsy and cervical cancer who follows up for headaches.   UPDATE: LABS:  10/05/19 B12 379; 11/04/19 ANA negative, RF negative, sed rate 6, uric acid 4; 12/09/19 TSH 2.220 11/02/2019 NCV-EMG of right upper extremity:  Findings suggestive of severe carpal tunnel syndrome and moderate ulnar neuropathy with slowing across the elbow.   She was referred to physical therapy for treatment of neck pain.  ***  Intensity:  *** Duration:  *** Frequency:  *** Frequency of abortive medication: *** Current NSAIDS:  *** Current analgesics:  *** Current triptans:  *** Current ergotamine:  *** Current anti-emetic:  *** Current muscle relaxants:  *** Current anti-anxiolytic:  *** Current sleep aide:  *** Current Antihypertensive medications:  *** Current Antidepressant medications:  *** Current Anticonvulsant medications:  *** Current anti-CGRP:  *** Current Vitamins/Herbal/Supplements:  *** Current Antihistamines/Decongestants:  *** Other therapy:  *** Other medication:  ***  Caffeine:  *** Alcohol:  *** Smoker:  *** Diet:  *** Exercise:  *** Depression:  ***; Anxiety:  *** Other pain:  *** Sleep hygiene:  ***  HISTORY: She has had headaches when she was in her 60s which were thought to be migraines, aggravated by sunlight, but they resolved.  She was evaluated in the ED on 08/14/2019 for 3 days of new worsening headaches where she was diagnosed with tension-type headache.  It was a severe pressure like right occipital pain associated with hot sensation lasting an hour off and on daily.    In 2020, she also reports worsening right sided posterior neck pain with radiation into the right shoulder with numbness and tingling in the hands, right worse than left.  Endorses paresthesias in the fifth  digits bilaterally.  For several years, she has had intermittent numbness and tingling in both hands, right worse than left, that is worse at night when laying in bed but this was different.  Cervical spine X-ray from 09/07/2019 personally reviewed and showed mild disc space narrowing at C5-6 and cervical rib on left but otherwise unremarkable.  She was evaluated by orthopedic surgery on 09/23/2019 where exam demonstrated positive right Spurling test and positive Tinel's sign at over the right cubital tunnel and right carpal tunnel.  She was prescribed prednisone taper and Flexeril.  Headaches have improved but she still has residual pressure in back of head into the right sided of her neck.  She also reports that the bottom of her feet feel "hot" as well.  A couple of years ago, she did fall and hurt her neck.  Past muscle relaxants:  Flexeril (fatigue); Robaxin  PAST MEDICAL HISTORY: Past Medical History:  Diagnosis Date  . 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 Cancer (HCC)    Cervical  . Depression   . Laryngospasm 12/10/2016  . Left-sided Bell's palsy 01/19/2016  . 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 No pertinent past medical history   . Varicose veins of left leg with edema     MEDICATIONS: Current Outpatient Medications on File Prior to Visit  Medication Sig Dispense Refill  . doxycycline (VIBRAMYCIN) 100 MG capsule Take 1 capsule (100 mg total) by mouth 2 (two) times daily. 20 capsule 0  .  rosuvastatin (CRESTOR) 10 MG tablet Take 1 tablet (10 mg total) by mouth daily. To lower cholesterol (Patient not taking: Reported on 12/09/2019) 30 tablet 5   No current facility-administered medications on file prior to visit.    ALLERGIES: Allergies  Allergen Reactions  . Bactrim [Sulfamethoxazole-Trimethoprim] Itching    FAMILY HISTORY: Family History    Problem Relation Age of Onset  . Diabetes Father   . Cancer Sister        uterine  . Diabetes Sister   . Diabetes Paternal Grandmother   . Diabetes Paternal Uncle   . Diabetes Paternal Uncle   . Diabetes Paternal Uncle   . Diabetes Paternal Uncle   . Diabetes Paternal Uncle   . Diabetes Paternal Uncle    ***.  SOCIAL HISTORY: Social History   Socioeconomic History  . Marital status: Single    Spouse name: Not on file  . Number of children: 2  . Years of education: Not on file  . Highest education level: 6th grade  Occupational History  . Not on file  Tobacco Use  . Smoking status: Never Smoker  . Smokeless tobacco: Never Used  Substance and Sexual Activity  . Alcohol use: No  . Drug use: No  . Sexual activity: Yes    Birth control/protection: None, Other-see comments    Comment: withdrawal method  Other Topics Concern  . Not on file  Social History Narrative  . Not on file   Social Determinants of Health   Financial Resource Strain:   . Difficulty of Paying Living Expenses:   Food Insecurity:   . Worried About Charity fundraiser in the Last Year:   . Arboriculturist in the Last Year:   Transportation Needs: No Transportation Needs  . Lack of Transportation (Medical): No  . Lack of Transportation (Non-Medical): No  Physical Activity:   . Days of Exercise per Week:   . Minutes of Exercise per Session:   Stress:   . Feeling of Stress :   Social Connections:   . Frequency of Communication with Friends and Family:   . Frequency of Social Gatherings with Friends and Family:   . Attends Religious Services:   . Active Member of Clubs or Organizations:   . Attends Archivist Meetings:   Marland Kitchen Marital Status:   Intimate Partner Violence:   . Fear of Current or Ex-Partner:   . Emotionally Abused:   Marland Kitchen Physically Abused:   . Sexually Abused:     PHYSICAL EXAM: *** General: No acute distress.  Patient appears well-groomed.   Head:   Normocephalic/atraumatic Eyes:  Fundi examined but not visualized Neck: supple, no paraspinal tenderness, full range of motion Heart:  Regular rate and rhythm Lungs:  Clear to auscultation bilaterally Back: No paraspinal tenderness Neurological Exam: alert and oriented to person, place, and time. Attention span and concentration intact, recent and remote memory intact, fund of knowledge intact.  Speech fluent and not dysarthric, language intact.  CN II-XII intact. Bulk and tone normal, muscle strength 5/5 throughout.  Sensation to light touch, temperature and vibration intact.  Deep tendon reflexes 2+ throughout, toes downgoing.  Finger to nose and heel to shin testing intact.  Gait normal, Romberg negative.  IMPRESSION: 1.  Right sided occipital headache, likely cervicogenic 2.  Cervicalgia 3.  Right sided carpal tunnel syndrome and ulnar neuropathy across elbow.  PLAN: 1.  ***  Metta Clines, DO  CC: ***

## 2020-02-07 ENCOUNTER — Ambulatory Visit: Payer: No Typology Code available for payment source | Admitting: Neurology

## 2020-02-24 ENCOUNTER — Ambulatory Visit: Payer: Self-pay | Admitting: Family Medicine

## 2020-03-01 ENCOUNTER — Other Ambulatory Visit: Payer: Self-pay

## 2020-03-01 ENCOUNTER — Ambulatory Visit: Payer: Self-pay | Attending: Family Medicine | Admitting: Physician Assistant

## 2020-03-01 VITALS — BP 103/84 | HR 76 | Temp 97.6°F | Ht 62.0 in | Wt 149.0 lb

## 2020-03-01 DIAGNOSIS — M545 Low back pain: Secondary | ICD-10-CM

## 2020-03-01 DIAGNOSIS — M25562 Pain in left knee: Secondary | ICD-10-CM

## 2020-03-01 DIAGNOSIS — Z79899 Other long term (current) drug therapy: Secondary | ICD-10-CM

## 2020-03-01 DIAGNOSIS — L659 Nonscarring hair loss, unspecified: Secondary | ICD-10-CM

## 2020-03-01 DIAGNOSIS — Z789 Other specified health status: Secondary | ICD-10-CM

## 2020-03-01 DIAGNOSIS — G8929 Other chronic pain: Secondary | ICD-10-CM

## 2020-03-01 DIAGNOSIS — E782 Mixed hyperlipidemia: Secondary | ICD-10-CM

## 2020-03-01 MED ORDER — ROSUVASTATIN CALCIUM 10 MG PO TABS: 10.0000 mg | ORAL_TABLET | Freq: Every day | ORAL | 5 refills | Status: DC

## 2020-03-01 MED ORDER — NAPROXEN 500 MG PO TABS: 500.0000 mg | ORAL_TABLET | Freq: Two times a day (BID) | ORAL | 1 refills | Status: DC

## 2020-03-01 MED FILL — ?NAPROXEN 500 MG TABS: 500 | 30 days supply | Qty: 60 | Fill #0

## 2020-03-01 MED FILL — ROSUVASTATIN CALCIUM 10 MG: 10 | 30 days supply | Qty: 30 | Fill #0

## 2020-03-01 NOTE — Patient Instructions (Signed)
Terapia de RHCE para los cuidados de rutina de las lesiones RICE Therapy for Routine Care of Injuries Muchas lesiones pueden tratarse con reposo, hielo, compresin y elevacin (terapia RHCE). Esto incluye lo siguiente:  Careers information officer de la lesin en reposo.  Aplicar hielo sobre la lesin.  Ejercer presin (compresin) sobre la lesin.  Elevar la parte lesionada (elevacin). La terapia RHCE puede ayudar a Best boy y reducir la hinchazn. Materiales necesarios:  Hielo.  Bolsa plstica.  Toalla.  Vendaje elstico.  Ardelia Mems o varias almohadas para Engineer, production (elevar) la parte lesionada del cuerpo. Cmo tratar la lesin con terapia de RHCE Reposo Limite las actividades que realiza normalmente e intente no Risk manager la parte lesionada del cuerpo. Puede reanudar las actividades normales cuando el mdico lo autorice y usted se Environmental health practitioner. Pregntele al mdico si debe realizar ejercicios para ayudar a que la lesin mejore. Hielo Aplique hielo sobre la zona lesionada. No aplique el hielo directamente sobre la piel.  Ponga el hielo en una bolsa plstica.  Coloque una toalla entre la piel y la bolsa de hielo.  Coloque el hielo durante 67minutos, 2 a 3veces por Training and development officer. Aplique hielo tantos [employer] se lo haya indicado el mdico.  Compresin La compresin implica ejercer presin sobre la zona lesionada. Esto se puede realizar con Management consultant. Si se coloc una venda elstica sobre la lesin:  No ajuste demasiado la venda. Afloje la venda si alguna parte del cuerpo lejos de la venda se torna de color azul, se hincha, enfra o le duele, o si pierde la sensibilidad en esa rea (entumecimiento).  Qutese la venda y colquela nuevamente. Hgalo cada 3 o 4 horas o como se lo indique el mdico.  Consulte al mdico si la venda parece [employer].  Elevacin La elevacin implica mantener elevada la zona lesionada. Si puede, eleve la zona lesionada por encima del  nivel del corazn o del centro del pecho. Comunquese con un mdico si:  El dolor y la hinchazn continan.  Los sntomas empeoran. Solicite ayuda de inmediato si:  Siente un dolor intenso repentino en la zona de la lesin o por debajo de ella.  Tiene irritacin o ms hinchazn alrededor de la lesin.  Siente hormigueo o adormecimiento en la zona de la lesin o por debajo de ella y no desaparece despus de quitarse la venda. Resumen  Muchas lesiones se pueden tratar con reposo, hielo, compresin y elevacin (terapia RHCE).  Puede reanudar las actividades normales cuando se sienta bien y el mdico lo autorice.  Aplique hielo sobre la zona lesionada, segn las indicaciones del mdico.  Busque ayuda si los sntomas empeoran o si el dolor y la hinchazn continan. Esta informacin no tiene Marine scientist el consejo del mdico. Asegrese de hacerle al mdico cualquier pregunta que tenga. Document Revised: 10/22/2017 Document Reviewed: 10/22/2017 Elsevier Patient Education  [place] High Cholesterol  El colesterol elevado es una afeccin en la que la sangre tiene niveles altos de una sustancia [place], cerosa y parecida a la grasa (colesterol). El organismo humano necesita una pequea cantidad de colesterol. El hgado fabrica todo el colesterol que el organismo necesita. El exceso de colesterol proviene de los alimentos que comemos. La sangre transporta el colesterol desde el hgado a travs de los vasos sanguneos. Si tiene el colesterol elevado, este puede depositarse (formar placas) en las paredes de los vasos sanguneos (arterias). Las Occupational hygienist y la rigidez [place]  arterias. Las placas de colesterol aumentan el riesgo de sufrir un infarto de miocardio y un accidente cerebrovascular. Trabaje con el mdico para [employer] concentraciones de colesterol en un rango saludable. Qu incrementa el riesgo? Es ms probable que Furniture conservator/restorer en las personas que:  Consumen alimentos con alto contenido de grasa animal (grasa saturada) o colesterol.  Tienen sobrepeso.  No hacen suficiente ejercicio fsico.  Tienen antecedentes familiares de colesterol elevado. Cules son los signos o los sntomas? Esta afeccin no presenta sntomas. Cmo se diagnostica? Esta afeccin podra diagnosticarse a [place] de anlisis de [place].  Si es mayor de 20aos, es posible que el mdico le controle el colesterol cada [id number].  Los controles pueden ser ms frecuentes si ya tuvo el colesterol elevado u otros factores de riesgo de enfermedades cardacas. En el anlisis de sangre de [place], se determina lo siguiente:  El colesterol "malo" (colesterol LDL). Este es el principal tipo de colesterol que causa enfermedades cardacas. El nivel recomendado de LDL es de menos de100.  El colesterol "bueno" (colesterol HDL). Este tipo ayuda a Tour manager las enfermedades [place] arterias y arrastrando el LDL. El nivel recomendado de HDL es de60 o superior.  Triglicridos. Estos son grasas que el organismo puede Financial controller o quemar como fuente de [place]. El nivel recomendado de triglicridos es de menos de 150.  Colesterol total. Esta es una medicin de la cantidad total de colesterol en la sangre, que incluye el colesterol LDL, el colesterol HDL y los triglicridos. El valor saludable es de menos de200. Cmo se trata? Esta afeccin se trata con cambios en la dieta y en el estilo de vida, y con medicamentos. Cambios en la [employer], la ingesta de una mayor cantidad de cereales integrales, frutas, verduras, frutos secos y pescado.  Tambin podran incluir la reduccin del consumo de carnes rojas y alimentos con Architectural technologist. Cambios en el estilo de vida  Entre ellos, realizar sesiones de ejercicios aerbicos durante, por lo menos, 13minutos, 3veces por semana.  Por ejemplo, caminar, andar en bicicleta y nadar. Los ejercicios aerbicos junto con una dieta sana pueden ayudar a que se [place]ar[place] manager en un peso saludable.  Los cambios tambin podran incluir dejar de fumar. Medicamentos  Por lo general, se administran medicamentos si con los Licensed conveyancer y en el estilo de vida no se logra reducir el colesterol hasta niveles saludables.  El mdico podra recetarle estatinas. Se ha demostrado que las estatinas [employer] niveles de [place], lo que puede reducir el riesgo de Insurance risk surveyor una enfermedad cardaca. Siga estas indicaciones en su casa: Comida y bebida Si se lo indic el mdico:  Coma pollo (sin piel), pescado, ternera, mariscos, pechuga de [place] y cortes de carne roja de pulpa o de lomo.  No coma alimentos fritos ni carnes grasosas, como salchichas y salame.  Coma muchas frutas, como manzanas.  Coma gran cantidad de verduras, como brcoli, papas y zanahorias.  Coma porotos, guisantes secos y lentejas.  Coma cereales, como cebada, arroz, cuscs y trigo burgol.  Coma pastas sin salsas con crema.  [place], y coma yogures y quesos descremados o semidescremados.  No coma ni beba Mattel, crema, helado, yemas de huevo ni quesos duros.  No coma margarinas en barra ni untables que contengan grasas trans (que tambin se conocen como aceites parcialmente hidrogenados).  No coma aceites tropicales saturados, como el de coco y [place] de  palma.  No coma tortas, galletas, galletitas ni otros productos horneados que contengan grasas trans.  Instrucciones generales  Haga ejercicio segn las indicaciones del mdico. Aumente la cantidad de ejercicio fsico que realiza mediante actividades como jardinera, salir a Writer o usar las escaleras.  Tome los medicamentos de venta libre y los recetados solamente como se lo haya indicado el mdico.  No consuma ningn producto que contenga nicotina o  tabaco, como cigarrillos y Psychologist, sport and exercise. Si necesita ayuda para dejar de fumar, consulte al mdico.  Concurra a todas las visitas de control como se lo haya indicado el mdico. Esto es importante. Comunquese con un mdico si:  Tiene dificultad para seguir una dieta sana o mantener un peso saludable.  Necesita ayuda para comenzar un programa de ejercicios.  Necesita ayuda para dejar de fumar. Solicite ayuda de inmediato si:  Electronics engineer.  Tiene dificultad para respirar. Esta informacin no tiene Marine scientist el consejo del mdico. Asegrese de hacerle al mdico cualquier pregunta que tenga. Document Revised: 02/10/2017 Document Reviewed: 05/04/2016 Elsevier Patient Education  [place].

## 2020-03-01 NOTE — Progress Notes (Signed)
Patient ID: Alicia Miller, female   DOB: Jun 06, 1976, 44 y.o.   MRN: [id number]   Alicia Miller, is a 44 y.o. female  [id number]  [id number]  DOB - [birth year]  Subjective:  Chief Complaint and HPI: Alicia Miller is a 44 y.o. female here today to recheck on cholesterol.  Not on meds for cholesterol even though she is supposed to be on Crestor.  She is not following cholesterol diet.    Also c/o 3 month h/o pain in R side of her back.  This has been occurring on and off for several months and seems to be related to when she does new activities.  No urinary s/sx.  No radiating pain or paresthesias.  Also c/o L knee pain since doing Zumba a few days ago.  OTC aleve helping some.  No locking/popping.  No definite injury but she had not done exercise in a while   ROS:   Constitutional:  No f/c, No night sweats, No unexplained weight loss. EENT:  No vision changes, No blurry vision, No hearing changes. No mouth, throat, or ear problems.  Respiratory: No cough, No SOB Cardiac: No CP, no palpitations GI:  No abd pain, No N/V/D. GU: No Urinary s/sx Musculoskeletal: see above Neuro: No headache, no dizziness, no motor weakness.  Skin: No rash Endocrine:  No polydipsia. No polyuria.  Psych: Denies SI/HI  No problems updated.  ALLERGIES: Allergies  Allergen Reactions  . Bactrim [Sulfamethoxazole-Trimethoprim] Itching    PAST MEDICAL HISTORY: Past Medical History:  Diagnosis Date  . 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 Cancer (HCC)    Cervical  . Depression   . Laryngospasm 12/10/2016  . Left-sided Bell's palsy 01/19/2016  . 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 No pertinent past medical history   . Varicose veins of left leg with edema     MEDICATIONS AT HOME: Prior to Admission medications   Medication Sig Start Date  End Date Taking? Authorizing Provider  naproxen (NAPROSYN) 500 MG tablet Take 1 tablet (500 mg total) by mouth 2 (two) times daily with a meal. 03/01/20   Macy Polio, Dionne Bucy, PA-C  rosuvastatin (CRESTOR) 10 MG tablet Take 1 tablet (10 mg total) by mouth daily. To lower cholesterol 03/01/20   Argentina Donovan, PA-C     Objective:  EXAM:   Vitals:   03/01/20 0908  Weight: 149 lb (67.6 kg)  Height: 5\' 2"  (1.575 m)  BP:  103/84 Pulse: 76 Respirations:  14 General appearance : A&OX3. NAD. Non-toxic-appearing HEENT: Atraumatic and Normocephalic.  PERRLA. EOM intact.  Chest/Lungs:  Breathing-non-labored, Good air entry bilaterally, breath sounds normal without rales, rhonchi, or wheezing  CVS: S1 S2 regular, no murmurs, gallops, rubs  Back:  No spiny tenderness.  +paraspinus spasm in R mid to lowe back.  Neg SLR B and DTR=intact B Extremities: Bilateral Lower Ext shows no edema, both legs are warm to touch with = pulse throughout.  R knee with mild suprapatellar swelling.  No ligament instability.  No ballotment/effusion.  No erythema  Neurology:  CN II-XII grossly intact, Non focal.   Psych:  TP linear. J/I WNL. Normal speech. Appropriate eye contact and affect.  Skin:  No Rash  Data Review Lab Results  Component Value Date   HGBA1C 5.5 07/31/2018   HGBA1C 5.4 10/22/2017   HGBA1C  5.3 01/19/2016     Assessment & Plan   1. Mixed hyperlipidemia Patient education.  Gave ideas for reminders for meds such as setting a time, placing Rx with toothbrush, etc - rosuvastatin (CRESTOR) 10 MG tablet; Take 1 tablet (10 mg total) by mouth daily. To lower cholesterol  Dispense: 30 tablet; Refill: 5 - Lipid panel  2. Encounter for long-term current use of medication - rosuvastatin (CRESTOR) 10 MG tablet; Take 1 tablet (10 mg total) by mouth daily. To lower cholesterol  Dispense: 30 tablet; Refill: 5 - Lipid panel  3. Acute pain of left knee - naproxen (NAPROSYN) 500 MG tablet; Take 1 tablet  (500 mg total) by mouth 2 (two) times daily with a meal.  Dispense: 60 tablet; Refill: 1 - Ambulatory referral to Orthopedic Surgery  4. Chronic right-sided low back pain without sciatica - naproxen (NAPROSYN) 500 MG tablet; Take 1 tablet (500 mg total) by mouth 2 (two) times daily with a meal.  Dispense: 60 tablet; Refill: 1  5. Language barrier affecting health care avm interpreters used and additional time performing visit was required.   6. Hair loss - Vitamin D, 25-hydroxy - TSH   Patient have been counseled extensively about nutrition and exercise  Return in about 3 months (around 05/31/2020) for PCP;  cholesterol.  The patient was given clear instructions to go to ER or return to medical center if symptoms don't improve, worsen or new problems develop. The patient verbalized understanding. The patient was told to call to get lab results if they haven't heard anything in the next week.     Freeman Caldron, PA-C [facility] [place], [place]   03/01/2020, 9:32 AM

## 2020-03-01 NOTE — Progress Notes (Signed)
Alicia Miller [id number]  Lower back pain at night and right knee pain   Per pt this is f/u for her cholesterol

## 2020-03-02 LAB — LIPID PANEL
Chol/HDL Ratio: 4.6 ratio — ABNORMAL HIGH (ref 0.0–4.4)
Cholesterol, Total: 240 mg/dL — ABNORMAL HIGH (ref 100–199)
HDL: 52 mg/dL (ref >39)
LDL Chol Calc (NIH): 161 mg/dL — ABNORMAL HIGH (ref 0–99)
Triglycerides: 148 mg/dL (ref 0–149)
VLDL Cholesterol Cal: 27 mg/dL (ref 5–40)

## 2020-03-02 LAB — VITAMIN D 25 HYDROXY (VIT D DEFICIENCY, FRACTURES): Vit D, 25-Hydroxy: 26.8 ng/mL — ABNORMAL LOW (ref 30.0–100.0)

## 2020-03-02 LAB — TSH: TSH: 3.05 u[IU]/mL (ref 0.450–4.500)

## 2020-03-07 ENCOUNTER — Ambulatory Visit (INDEPENDENT_AMBULATORY_CARE_PROVIDER_SITE_OTHER): Payer: Self-pay | Admitting: Family Medicine

## 2020-03-07 ENCOUNTER — Encounter: Payer: Self-pay | Admitting: Family Medicine

## 2020-03-07 ENCOUNTER — Other Ambulatory Visit: Payer: Self-pay

## 2020-03-07 DIAGNOSIS — M25561 Pain in right knee: Secondary | ICD-10-CM

## 2020-03-07 DIAGNOSIS — M25361 Other instability, right knee: Secondary | ICD-10-CM

## 2020-03-07 NOTE — Progress Notes (Signed)
Office Visit Note   Patient: Alicia Miller           Date of Birth: January 02, 1976           MRN: [id number] Visit Date: 03/07/2020 Requested by: Argentina Donovan, PA-C [place],  [place] [address] PCP: Antony Blackbird, MD  Subjective: Chief Complaint  Patient presents with  . Right Knee - Pain    Chronic pain, but pain worsened last week after Zumba. Felt a "stretch" in posterior knee and felt like the knee swelled.     HPI: She is here with right knee pain.  She has had chronic intermittent pain in the knee over the years, but recently she took a Zumba class and afterward her knee was stiff and swollen and it felt like it was hyperextending.  She has continued to have swelling and posterior pain despite using naproxen.  In the past she has had x-rays which were unremarkable.  Denies any locking but the knee often feels like it is going to give way.  She is trying to stay active exercising.  She likes to jog as well.  She is otherwise been in good health.  Interpreter was present for the examination today.              ROS:   All other systems were reviewed and are negative.  Objective: Vital Signs: There were no vitals taken for this visit.  Physical Exam:  General:  Alert and oriented, in no acute distress. Pulm:  Breathing unlabored. Psy:  Normal mood, congruent affect.  Right knee: 2+ effusion with no warmth.  There is 1+ patellofemoral crepitus.  No significant pain with patella compression or apprehension.  She has no solid endpoint with Lachman's.  PCL feels intact.  There is no laxity with varus or valgus stress.  Mild tenderness on the posterior medial and posterior lateral joint lines but no palpable click with McMurray's.  Imaging: No results found.  Assessment & Plan: 1.  Acute on chronic right knee pain with probable complete ACL tear -Discussed options with her and elected to proceed with MRI scan.  Depending on the results, may pursue surgical  reconstruction.     Procedures: No procedures performed  No notes on file     PMFS History: Patient Active Problem List   Diagnosis Date Noted  . 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 Martin-Gruber anastomosis ([place]) Right 11/16/2019  . Mood changes 07/28/2019  . Dyspareunia in female 07/28/2019  . Menorrhagia with regular cycle 07/28/2019  . S/P cholecystectomy 07/28/2019  . Nevus, non-neoplastic 08/06/2017  . Dermatofibroma 08/06/2017  . Seborrheic keratoses 06/12/2017  . 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 Pruritic erythematous rash 06/12/2017  . Varicose veins of left lower extremity with complications [id number]  . Vitamin D insufficiency 08/16/2016  . Joint laxity of right knee 08/15/2016  . Chronic pain of left ankle 08/15/2016  . Chronic pain of right ankle 08/15/2016  . Fatigue 08/15/2016  . Stress incontinence, female 02/12/2016  . Breast tenderness in female 02/12/2016  . Allergic rhinitis 08/11/2015  . Snoring 08/11/2015  . Laryngopharyngeal reflux 08/11/2015   Past Medical History:  Diagnosis Date  . 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 Cancer (HCC)    Cervical  . Depression   . Laryngospasm 12/10/2016  . Left-sided Bell's palsy 01/19/2016  . Martin-Gruber anastomosis (HCC)  Right 11/16/2019   Noted on EMG Dec 2020  . No pertinent past medical history   . Varicose veins of left leg with edema     Family History  Problem Relation Age of Onset  . Diabetes Father   . Cancer Sister        uterine  . Diabetes Sister   . Diabetes Paternal Grandmother   . Diabetes Paternal Uncle   . Diabetes Paternal Uncle   . Diabetes Paternal Uncle   . Diabetes Paternal Uncle   . Diabetes Paternal Uncle   . Diabetes Paternal Uncle     Past Surgical History:  Procedure Laterality Date  . CHOLECYSTECTOMY  05/2015  . ENDOVENOUS ABLATION SAPHENOUS VEIN W/ LASER Left 12/08/2017    endovenous laser ablation left greater saphenous vein and stab phlebectomy 10-20 incisions left leg by Tinnie Gens MD    Social History   Occupational History  . Not on file  Tobacco Use  . Smoking status: Never Smoker  . Smokeless tobacco: Never Used  Substance and Sexual Activity  . Alcohol use: No  . Drug use: No  . Sexual activity: Yes    Birth control/protection: None, Other-see comments    Comment: withdrawal method

## 2020-05-04 ENCOUNTER — Ambulatory Visit
Admission: RE | Admit: 2020-05-04 | Discharge: 2020-05-04 | Disposition: A | Discharge status: Home / Self Care | Payer: No Typology Code available for payment source | Source: Ambulatory Visit | Attending: Family Medicine | Admitting: Family Medicine

## 2020-05-04 ENCOUNTER — Other Ambulatory Visit: Payer: Self-pay

## 2020-05-04 ENCOUNTER — Telehealth: Payer: Self-pay | Admitting: Family Medicine

## 2020-05-04 DIAGNOSIS — M25361 Other instability, right knee: Secondary | ICD-10-CM

## 2020-05-04 DIAGNOSIS — M25561 Pain in right knee: Secondary | ICD-10-CM

## 2020-05-04 NOTE — Telephone Encounter (Signed)
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  The ACL and PCL look intact, the meniscus cartilages do not look torn.

## 2020-05-04 NOTE — Telephone Encounter (Signed)
Spoke with her son and he will let her know this

## 2020-05-31 ENCOUNTER — Ambulatory Visit: Payer: Self-pay | Admitting: Family Medicine

## 2020-06-07 NOTE — Progress Notes (Signed)
NEUROLOGY FOLLOW UP OFFICE NOTE  Alicia Miller [id number]  HISTORY OF PRESENT ILLNESS: Alicia Miller is a 44 year old female with depression and history of left-sided Bell's palsy and cervical cancer who follows up for neck pain and headache.  UPDATE: NCV-EMG from 11/02/2019 demonstrated severe right carpal tunnel syndrome and moderate right cubital tunnel syndrome.  She followed up with her orthopedist.  She declined surgery.  She underwent physical therapy for both neck and back pain with radiculopathy which which was helpful.  Headaches have been improved.  They are mild and occur occasionally.  Marland Kitchen    HISTORY: She has had headaches when she was in her 54s which were thought to be migraines, aggravated by sunlight, but they resolved.  She was evaluated in the ED on 08/14/2019 for 3 days of new worsening headaches where she was diagnosed with tension-type headache.  It was a severe pressure like right occipital pain associated with hot sensation lasting an hour off and on daily.    In 2020, she also reports worsening right sided posterior neck pain with radiation into the right shoulder with numbness and tingling in the hands, right worse than left.  Endorses paresthesias in the fifth digits bilaterally.  For several years, she has had intermittent numbness and tingling in both hands, right worse than left, that is worse at night when laying in bed but this was different.  Cervical spine X-ray from 09/07/2019 personally reviewed and showed mild disc space narrowing at C5-6 and cervical rib on left but otherwise unremarkable.  She was evaluated by orthopedic surgery on 09/23/2019 where exam demonstrated positive right Spurling test and positive Tinel's sign at over the right cubital tunnel and right carpal tunnel.  She was prescribed prednisone taper and Flexeril.  Headaches have improved but she still has residual pressure in back of head into the right sided of her neck.  She also  reports that the bottom of her feet feel "hot" as well.  A couple of years ago, she did fall and hurt her neck.  Past muscle relaxants:  Flexeril (fatigue); Robaxin  PAST MEDICAL HISTORY: Past Medical History:  Diagnosis Date  . 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 Cancer (HCC)    Cervical  . Depression   . Laryngospasm 12/10/2016  . Left-sided Bell's palsy 01/19/2016  . 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 No pertinent past medical history   . Varicose veins of left leg with edema     MEDICATIONS: Current Outpatient Medications on File Prior to Visit  Medication Sig Dispense Refill  . naproxen (NAPROSYN) 500 MG tablet Take 1 tablet (500 mg total) by mouth 2 (two) times daily with a meal. 60 tablet 1  . rosuvastatin (CRESTOR) 10 MG tablet Take 1 tablet (10 mg total) by mouth daily. To lower cholesterol 30 tablet 5   No current facility-administered medications on file prior to visit.    ALLERGIES: Allergies  Allergen Reactions  . Bactrim [Sulfamethoxazole-Trimethoprim] Itching    FAMILY HISTORY: Family History  Problem Relation Age of Onset  . Diabetes Father   . Cancer Sister        uterine  . Diabetes Sister   . Diabetes Paternal Grandmother   . Diabetes Paternal Uncle   . Diabetes Paternal Uncle   . Diabetes Paternal Uncle   .  Diabetes Paternal Uncle   . Diabetes Paternal Uncle   . Diabetes Paternal Uncle     SOCIAL HISTORY: Social History   Socioeconomic History  . Marital status: Single    Spouse name: Not on file  . Number of children: 2  . Years of education: Not on file  . Highest education level: 6th grade  Occupational History  . Not on file  Tobacco Use  . Smoking status: Never Smoker  . Smokeless tobacco: Never Used  Vaping Use  . Vaping Use: Never used  Substance and Sexual Activity  . Alcohol use: No  .  Drug use: No  . Sexual activity: Yes    Birth control/protection: None, Other-see comments    Comment: withdrawal method  Other Topics Concern  . Not on file  Social History Narrative  . Not on file   Social Determinants of Health   Financial Resource Strain:   . Difficulty of Paying Living Expenses:   Food Insecurity:   . Worried About Charity fundraiser in the Last Year:   . Arboriculturist in the Last Year:   Transportation Needs: No Transportation Needs  . Lack of Transportation (Medical): No  . Lack of Transportation (Non-Medical): No  Physical Activity:   . Days of Exercise per Week:   . Minutes of Exercise per Session:   Stress:   . Feeling of Stress :   Social Connections:   . Frequency of Communication with Friends and Family:   . Frequency of Social Gatherings with Friends and Family:   . Attends Religious Services:   . Active Member of Clubs or Organizations:   . Attends Archivist Meetings:   Marland Kitchen Marital Status:   Intimate Partner Violence:   . Fear of Current or Ex-Partner:   . Emotionally Abused:   Marland Kitchen Physically Abused:   . Sexually Abused:     PHYSICAL EXAM: Blood pressure 116/71, pulse 91, height 5\' 2"  (1.575 m), weight 145 lb (65.8 kg), SpO2 99 %. General: No acute distress.  Patient appears well-groomed.   Head:  Normocephalic/atraumatic Eyes:  Fundi examined but not visualized Neck: supple, no paraspinal tenderness, full range of motion Heart:  Regular rate and rhythm Lungs:  Clear to auscultation bilaterally Back: No paraspinal tenderness Neurological Exam: alert and oriented to person, place, and time. Attention span and concentration intact, recent and remote memory intact, fund of knowledge intact.  Speech fluent and not dysarthric, language intact.  CN II-XII intact. Bulk and tone normal, muscle strength 5/5 throughout.  Sensation to light touch, temperature and vibration intact.  Deep tendon reflexes 2+ throughout, toes downgoing.   Finger to nose and heel to shin testing intact.  Gait normal, Romberg negative.  IMPRESSION: 1.  Right sided occipital headache, occipital neuralgia vs upper cervical radiculopathy, resolved 2.  Carpal tunnel syndrome 3.  Cubital tunnel syndrome  Doing well  PLAN: Follow up as needed  Metta Clines, DO  CC: Antony Blackbird, MD

## 2020-06-09 ENCOUNTER — Other Ambulatory Visit: Payer: Self-pay

## 2020-06-09 ENCOUNTER — Encounter: Payer: Self-pay | Admitting: Neurology

## 2020-06-09 ENCOUNTER — Ambulatory Visit (INDEPENDENT_AMBULATORY_CARE_PROVIDER_SITE_OTHER): Payer: No Typology Code available for payment source | Admitting: Neurology

## 2020-06-09 VITALS — BP 116/71 | HR 91 | Ht 62.0 in | Wt 145.0 lb

## 2020-06-09 DIAGNOSIS — G5621 Lesion of ulnar nerve, right upper limb: Secondary | ICD-10-CM

## 2020-06-09 DIAGNOSIS — M542 Cervicalgia: Secondary | ICD-10-CM

## 2020-06-09 DIAGNOSIS — M5481 Occipital neuralgia: Secondary | ICD-10-CM

## 2020-06-09 DIAGNOSIS — G5601 Carpal tunnel syndrome, right upper limb: Secondary | ICD-10-CM

## 2020-06-09 NOTE — Patient Instructions (Signed)
Follow up as needed

## 2020-06-15 ENCOUNTER — Ambulatory Visit: Payer: No Typology Code available for payment source | Attending: Family Medicine | Admitting: Physician Assistant

## 2020-06-15 ENCOUNTER — Other Ambulatory Visit: Payer: Self-pay

## 2020-06-15 ENCOUNTER — Encounter: Payer: Self-pay | Admitting: Physician Assistant

## 2020-06-15 ENCOUNTER — Other Ambulatory Visit: Payer: Self-pay | Admitting: Physician Assistant

## 2020-06-15 DIAGNOSIS — R079 Chest pain, unspecified: Secondary | ICD-10-CM

## 2020-06-15 DIAGNOSIS — Z789 Other specified health status: Secondary | ICD-10-CM

## 2020-06-15 DIAGNOSIS — E782 Mixed hyperlipidemia: Secondary | ICD-10-CM

## 2020-06-15 MED ORDER — ROSUVASTATIN CALCIUM 20 MG PO TABS: 20.0000 mg | ORAL_TABLET | Freq: Every day | ORAL | 3 refills | Status: DC

## 2020-06-15 MED ORDER — NAPROXEN 500 MG PO TABS: 500.0000 mg | ORAL_TABLET | Freq: Two times a day (BID) | ORAL | 1 refills | Status: DC

## 2020-06-15 MED ORDER — METHOCARBAMOL 500 MG PO TABS: 500.0000 mg | ORAL_TABLET | Freq: Four times a day (QID) | ORAL | 0 refills | Status: DC

## 2020-06-15 MED FILL — ?NAPROXEN 500 MG TABS: 500 | 30 days supply | Qty: 60 | Fill #0

## 2020-06-15 MED FILL — METHOCARBAMOL 500 MG TABS: 500 | 15 days supply | Qty: 90 | Fill #0

## 2020-06-15 MED FILL — ROSUVASTATIN CALCIUM 20 MG: 20 | 30 days supply | Qty: 30 | Fill #0

## 2020-06-15 MED FILL — ROSUVASTATIN CALCIUM 10 MG: 10 | 30 days supply | Qty: 30 | Fill #1

## 2020-06-15 NOTE — Progress Notes (Signed)
Virtual Visit via Telephone Note  I connected with Alicia Miller on 06/15/20 at  9:10 AM EDT by telephone and verified that I am speaking with the correct person using two identifiers.   I discussed the limitations, risks, security and privacy concerns of performing an evaluation and management service by telephone and the availability of in person appointments. I also discussed with the patient that there may be a patient responsible charge related to this service. The patient expressed understanding and agreed to proceed.   PATIENT visit by telephone virtually in the context of Covid-19 pandemic. Patient location:  home My Location:  [place] office Persons on the call:  Angel(interpreter), the patient, and me   History of Present Illness:  Patient is complaining of pressure on her chest for about 2 weeks.  It seems worse when she does her chores.  No SOB.  Lasted the whole day.  Sometimes only last for a few mins.  No paresthesias.  No weakness.  No cough. No arm or jaw pain.  Pain is worse with movement.  Appetite is good.  No FH early MI.  Labs done in April.   Not taking cholesterol meds.   Observations/Objective:  NAD.  A&Ox3   Assessment and Plan: 1. Chest pain, unspecified type Discussed at length.  Reviewed EKG 10/2019 and WNL.  Call 911 if worsens, SOB, jaw/arm pain.  Patient verbalizes understanding - naproxen (NAPROSYN) 500 MG tablet; Take 1 tablet (500 mg total) by mouth 2 (two) times daily with a meal. Prn pain  Dispense: 60 tablet; Refill: 1 - methocarbamol (ROBAXIN) 500 MG tablet; Take 1 tablet (500 mg total) by mouth 4 (four) times daily. Prn muscle spasm  Dispense: 90 tablet; Refill: 0  2. Language barrier pacific interpreters used and additional time performing visit was required.   3. Mixed hyperlipidemia Not taking meds-based on the numbers, I am increasing the dose from 10-20 and discussed the need for this medication at length as well as healthy diet and  exercise - rosuvastatin (CRESTOR) 20 MG tablet; Take 1 tablet (20 mg total) by mouth daily.  Dispense: 90 tablet; Refill: 3    Follow Up Instructions: See PCP in 2-3 months   I discussed the assessment and treatment plan with the patient. The patient was provided an opportunity to ask questions and all were answered. The patient agreed with the plan and demonstrated an understanding of the instructions.   The patient was advised to call back or seek an in-person evaluation if the symptoms worsen or if the condition fails to improve as anticipated.  I provided 25 minutes of non-face-to-face time during this encounter.   Freeman Caldron, PA-C  Patient ID: Alicia Miller, female   DOB: Aug 01, 1976, 44 y.o.   MRN: [id number]

## 2020-07-06 ENCOUNTER — Other Ambulatory Visit: Payer: Self-pay

## 2020-07-06 ENCOUNTER — Ambulatory Visit (HOSPITAL_COMMUNITY)
Admission: EM | Admit: 2020-07-06 | Discharge: 2020-07-06 | Disposition: A | Discharge status: Home / Self Care | Payer: Self-pay | Attending: Urgent Care | Admitting: Urgent Care

## 2020-07-06 ENCOUNTER — Encounter (HOSPITAL_COMMUNITY): Payer: Self-pay

## 2020-07-06 DIAGNOSIS — Z3202 Encounter for pregnancy test, result negative: Secondary | ICD-10-CM

## 2020-07-06 DIAGNOSIS — R300 Dysuria: Secondary | ICD-10-CM | POA: Insufficient documentation

## 2020-07-06 DIAGNOSIS — N3001 Acute cystitis with hematuria: Secondary | ICD-10-CM | POA: Insufficient documentation

## 2020-07-06 DIAGNOSIS — M549 Dorsalgia, unspecified: Secondary | ICD-10-CM

## 2020-07-06 LAB — POCT URINALYSIS DIPSTICK, ED / UC
Bilirubin Urine: NEGATIVE
Glucose, UA: NEGATIVE mg/dL
Nitrite: POSITIVE — AB
Protein, ur: NEGATIVE mg/dL
Specific Gravity, Urine: >=1.03 (ref 1.005–1.030)
Urobilinogen, UA: 0.2 mg/dL (ref 0.0–1.0)
pH: 5.5 (ref 5.0–8.0)

## 2020-07-06 LAB — POC URINE PREG, ED: Preg Test, Ur: NEGATIVE

## 2020-07-06 MED ORDER — CEPHALEXIN 500 MG PO CAPS: 500.0000 mg | ORAL_CAPSULE | Freq: Two times a day (BID) | ORAL | 0 refills | Status: DC

## 2020-07-06 MED FILL — CEPHALEXIN 500 MG CAPSULE: 500 | 7 days supply | Qty: 14 | Fill #0

## 2020-07-06 NOTE — Discharge Instructions (Addendum)
Make sure you hydrate very well with plain water and a quantity of 64 ounces of water a day.  Please limit drinks that are considered urinary irritants such as soda, sweet tea, coffee, energy drinks, alcohol.  These can worsen your UTI symptoms and also be the source of them.  I will let you know about your urine culture results through MyChart to see if we need to change your antibiotics based off of those results. ° °

## 2020-07-06 NOTE — ED Triage Notes (Addendum)
Low back pain and dysuria since Tuesday, is taking AZO at home

## 2020-07-06 NOTE — ED Provider Notes (Signed)
Julian   MRN: [id number] DOB: [birth year]  Subjective:   Alicia Miller is a 44 y.o. female presenting for 2 day hx of acute onset dysuria, urinary frequency, right flank pain that radiated to her right lower abdominal pain. Has a remote hx of renal stone, never came out per patient. Belly pain is improved today.   No current facility-administered medications for this encounter.  Current Outpatient Medications:  .  methocarbamol (ROBAXIN) 500 MG tablet, Take 1 tablet (500 mg total) by mouth 4 (four) times daily. Prn muscle spasm, Disp: 90 tablet, Rfl: 0 .  naproxen (NAPROSYN) 500 MG tablet, Take 1 tablet (500 mg total) by mouth 2 (two) times daily with a meal. Prn pain, Disp: 60 tablet, Rfl: 1 .  rosuvastatin (CRESTOR) 20 MG tablet, Take 1 tablet (20 mg total) by mouth daily., Disp: 90 tablet, Rfl: 3   Allergies  Allergen Reactions  . Bactrim [Sulfamethoxazole-Trimethoprim] Itching    Past Medical History:  Diagnosis Date  . 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 Cancer (HCC)    Cervical  . Depression   . Laryngospasm 12/10/2016  . Left-sided Bell's palsy 01/19/2016  . 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 No pertinent past medical history   . Varicose veins of left leg with edema      Past Surgical History:  Procedure Laterality Date  . CHOLECYSTECTOMY  05/2015  . ENDOVENOUS ABLATION SAPHENOUS VEIN W/ LASER Left 12/08/2017   endovenous laser ablation left greater saphenous vein and stab phlebectomy 10-20 incisions left leg by Tinnie Gens MD     Family History  Problem Relation Age of Onset  . Diabetes Father   . Cancer Sister        uterine  . Diabetes Sister   . Diabetes Paternal Grandmother   . Diabetes Paternal Uncle   . Diabetes Paternal Uncle   . Diabetes Paternal Uncle   . Diabetes Paternal Uncle   . Diabetes  Paternal Uncle   . Diabetes Paternal Uncle     Social History   Tobacco Use  . Smoking status: Never Smoker  . Smokeless tobacco: Never Used  Vaping Use  . Vaping Use: Never used  Substance Use Topics  . Alcohol use: No  . Drug use: No    ROS   Objective:   Vitals: BP 111/70   Pulse 66   Temp 98 F (36.7 C)   Resp 16   SpO2 100%   Physical Exam Constitutional:      General: She is not in acute distress.    Appearance: Normal appearance. She is well-developed. She is not ill-appearing, toxic-appearing or diaphoretic.  HENT:     Head: Normocephalic and atraumatic.     Nose: Nose normal.     Mouth/Throat:     Mouth: Mucous membranes are moist.     Pharynx: Oropharynx is clear.  Eyes:     General: No scleral icterus.    Extraocular Movements: Extraocular movements intact.     Pupils: Pupils are equal, round, and reactive to light.  Cardiovascular:     Rate and Rhythm: Normal rate.  Pulmonary:     Effort: Pulmonary effort is normal.  Abdominal:     General: There is no distension.     Tenderness: There is abdominal tenderness (mild, over right flank side). There is  no right CVA tenderness, left CVA tenderness, guarding or rebound.  Skin:    General: Skin is warm and dry.  Neurological:     General: No focal deficit present.     Mental Status: She is alert and oriented to person, place, and time.  Psychiatric:        Mood and Affect: Mood normal.        Behavior: Behavior normal.     Results for orders placed or performed during the hospital encounter of 07/06/20 (from the past 24 hour(s))  POC Urinalysis dipstick     Status: Abnormal   Collection Time: 07/06/20  1:03 PM  Result Value Ref Range   Glucose, UA NEGATIVE NEGATIVE mg/dL   Bilirubin Urine NEGATIVE NEGATIVE   Ketones, ur TRACE (A) NEGATIVE mg/dL   Specific Gravity, Urine >=1.030 1.005 - 1.030   Hgb urine dipstick MODERATE (A) NEGATIVE   pH 5.5 5.0 - 8.0   Protein, ur NEGATIVE NEGATIVE mg/dL     Urobilinogen, UA 0.2 0.0 - 1.0 mg/dL   Nitrite POSITIVE (A) NEGATIVE   Leukocytes,Ua TRACE (A) NEGATIVE  POC urine pregnancy     Status: None   Collection Time: 07/06/20  1:07 PM  Result Value Ref Range   Preg Test, Ur NEGATIVE NEGATIVE    Assessment and Plan :   PDMP not reviewed this encounter.  1. Acute cystitis with hematuria   2. Dysuria     Start Keflex, urine culture pending. Low suspicion for pyelonephritis. Counseled patient on potential for adverse effects with medications prescribed/recommended today, ER and return-to-clinic precautions discussed, patient verbalized understanding.    Jaynee Eagles, PA-C 07/06/20 1426

## 2020-07-08 LAB — URINE CULTURE: Culture: 50000 — AB

## 2020-08-01 ENCOUNTER — Ambulatory Visit: Payer: No Typology Code available for payment source

## 2020-08-01 ENCOUNTER — Other Ambulatory Visit: Payer: Self-pay

## 2020-08-18 ENCOUNTER — Ambulatory Visit: Payer: No Typology Code available for payment source | Admitting: Family Medicine

## 2020-08-28 ENCOUNTER — Ambulatory Visit: Payer: No Typology Code available for payment source | Admitting: Family Medicine

## 2020-09-08 ENCOUNTER — Encounter: Payer: Self-pay | Admitting: Family Medicine

## 2020-09-08 ENCOUNTER — Other Ambulatory Visit: Payer: Self-pay

## 2020-09-08 ENCOUNTER — Ambulatory Visit: Payer: Self-pay | Attending: Family Medicine | Admitting: Family Medicine

## 2020-09-08 VITALS — BP 113/75 | HR 83 | Temp 97.0°F | Wt 148.0 lb

## 2020-09-08 DIAGNOSIS — E782 Mixed hyperlipidemia: Secondary | ICD-10-CM

## 2020-09-08 DIAGNOSIS — Z789 Other specified health status: Secondary | ICD-10-CM

## 2020-09-08 DIAGNOSIS — G44209 Tension-type headache, unspecified, not intractable: Secondary | ICD-10-CM

## 2020-09-08 DIAGNOSIS — Z79899 Other long term (current) drug therapy: Secondary | ICD-10-CM

## 2020-09-08 DIAGNOSIS — Z23 Encounter for immunization: Secondary | ICD-10-CM

## 2020-09-08 DIAGNOSIS — Z758 Other problems related to medical facilities and other health care: Secondary | ICD-10-CM

## 2020-09-08 DIAGNOSIS — Z603 Acculturation difficulty: Secondary | ICD-10-CM

## 2020-09-08 NOTE — Progress Notes (Signed)
ELEVATED BP READING 146/95 LAST WEEK

## 2020-09-08 NOTE — Patient Instructions (Signed)
Cefalea tensional en los adultos Tension Headache, Adult La cefalea tensional es un dolor o una presin que se siente en la cabeza. Las cefaleas tensionales pueden durar de 30 minutos a varios das. Siga estas indicaciones en su casa: Control del [employer] de venta libre y los recetados solamente como se lo haya indicado el mdico.  Cuando tenga una cefalea, acustese en una habitacin oscura y silenciosa.  Si se lo indican, aplquese hielo en la cabeza y el cuello: ? Ponga el hielo en una bolsa plstica. ? Coloque una [employer] piel y la bolsa de hielo. ? Coloque el hielo durante 59minutos, 2 o 3veces por da.  Si se lo indican, aplquese calor en la nuca. Hgalo con la frecuencia que le haya dicho el mdico. Use el tipo de calor que el mdico le recomiende, como una compresa de calor hmedo o una almohadilla trmica. ? Coloque una [employer] piel y la fuente de Freight forwarder. ? Aplique el calor durante 20 a 29minutos. ? Retire la fuente de calor si la piel se pone de color rojo brillante. Comida y bebida  Mantenga un horario para las comidas.  Controle la cantidad de alcohol que bebe: ? Si es mujer y no est embarazada, no consuma ms de 1 bebida por Training and development officer. ? Si es hombre, no consuma ms de 2 bebidas por Training and development officer.  Beba suficiente lquido para Contractor pis (orina) de color amarillo plido.  No consuma mucha cafena ni deje de consumirla por completo. Estilo de vida  Duerma lo suficiente. Duerma entre 7y 9horas todas las noches. O duerma la cantidad de [employer] indique el mdico.  A la hora de dormir, retire todos los dispositivos electrnicos de su habitacin. Algunos ejemplos de dispositivos electrnicos son las computadoras, los telfonos y las tabletas.  Encuentre maneras de Software engineer. Las siguientes son algunas cosas que pueden reducir el estrs: ? Actividad fsica. ? Respiracin profunda. ? Yoga. ? Msica. ? Pensamientos  positivos.  Sintese con la espalda recta. No contraiga (tensione) los msculos.  No consuma ningn producto que contenga nicotina o tabaco, como cigarrillos y Psychologist, sport and exercise. Si necesita ayuda para dejar de fumar, consulte al mdico. Instrucciones generales   Concurra a todas las visitas de control como se lo haya indicado el mdico. Esto es importante.  Evite las cosas que puedan provocar las cefaleas. Lleve un registro diario para Neurosurgeon si ciertas cosas provocan los dolores de [place]. Registre, por ejemplo, lo siguiente: ? Lo que usted come y bebe. ? Cunto tiempo duerme. ? Algn cambio en su dieta o en los medicamentos. Comunquese con un mdico si:  La cefalea no se alivia.  La cefalea regresa.  Tiene una cefalea y [employer] sonidos, la luz o los olores.  Tiene malestar estomacal (nuseas) o vomita.  Le duele el estmago. Solicite ayuda de inmediato si:  Siente un dolor de cabeza muy intenso de repente junto con alguno de estos sntomas: ? Rigidez en el cuello. ? Ganas de vomitar. ? Vmitos. ? Sensacin de debilidad. ? Dificultad para ver. ? Falta de aire. ? Erupcin cutnea. ? Somnolencia inusual. ? Dificultad para hablar. ? Dolor en el ojo o el odo. ? Dificultad para caminar o mantener el equilibrio. ? Sensacin de que se desvanecer (Geneticist, molecular). ? Desmayo. Resumen  La cefalea tensional es un dolor o una presin que se siente en la cabeza.  Las cefaleas tensionales pueden durar de 30 minutos a varios  das.  Los cambios en el estilo de vida y los medicamentos pueden ayudar a Best boy. Esta informacin no tiene Marine scientist el consejo del mdico. Asegrese de hacerle al mdico cualquier pregunta que tenga. Document Revised: 06/09/2017 Document Reviewed: 06/09/2017 Elsevier Patient Education  [place].

## 2020-09-08 NOTE — Progress Notes (Signed)
Established Patient Office Visit  Subjective:  Patient ID: Alicia Miller, female    DOB: [birth year]  Age: 44 y.o. MRN: [id number]  CC:  Chief Complaint  Patient presents with  . Follow-up    HPI Alicia Miller , 44 yo female, who is seen in follow-up of chronic medical issues including mixed hyperlipidemia and patient with complaint of continued issues with headaches which occur at the back of her scalp on either side and she feels as if there is spasms/tightness in the back of her head and sometimes she will get a sharp pain that radiates above her ears. She denies any issues with increased muscle aches with the use of her cholesterol medication.   Past Medical History:  Diagnosis Date  . 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 Cancer (HCC)    Cervical  . Depression   . Laryngospasm 12/10/2016  . Left-sided Bell's palsy 01/19/2016  . 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 No pertinent past medical history   . Varicose veins of left leg with edema     Past Surgical History:  Procedure Laterality Date  . CHOLECYSTECTOMY  05/2015  . ENDOVENOUS ABLATION SAPHENOUS VEIN W/ LASER Left 12/08/2017   endovenous laser ablation left greater saphenous vein and stab phlebectomy 10-20 incisions left leg by Tinnie Gens MD     Family History  Problem Relation Age of Onset  . Diabetes Father   . Cancer Sister        uterine  . Diabetes Sister   . Diabetes Paternal Grandmother   . Diabetes Paternal Uncle   . Diabetes Paternal Uncle   . Diabetes Paternal Uncle   . Diabetes Paternal Uncle   . Diabetes Paternal Uncle   . Diabetes Paternal Uncle     Social History   Socioeconomic History  . Marital status: Married    Spouse name: Not on file  . Number of children: 2  . Years of education: Not on file  . Highest education level: 6th grade    Occupational History  . Not on file  Tobacco Use  . Smoking status: Never Smoker  . Smokeless tobacco: Never Used  Vaping Use  . Vaping Use: Never used  Substance and Sexual Activity  . Alcohol use: No  . Drug use: No  . Sexual activity: Yes    Birth control/protection: None, Other-see comments    Comment: withdrawal method  Other Topics Concern  . Not on file  Social History Narrative  . Not on file   Social Determinants of Health   Financial Resource Strain:   . Difficulty of Paying Living Expenses: Not on file  Food Insecurity:   . Worried About Charity fundraiser in the Last Year: Not on file  . Ran Out of Food in the Last Year: Not on file  Transportation Needs:   . Lack of Transportation (Medical): Not on file  . Lack of Transportation (Non-Medical): Not on file  Physical Activity:   . Days of Exercise per Week: Not on file  . Minutes of Exercise per Session: Not on file  Stress:   . Feeling of Stress : Not on file  Social Connections:   . Frequency of Communication with Friends and Family: Not on file  . Frequency of Social Gatherings with Friends and Family: Not on file  .  Attends Religious Services: Not on file  . Active Member of Clubs or Organizations: Not on file  . Attends Archivist Meetings: Not on file  . Marital Status: Not on file  Intimate Partner Violence:   . Fear of Current or Ex-Partner: Not on file  . Emotionally Abused: Not on file  . Physically Abused: Not on file  . Sexually Abused: Not on file    Outpatient Medications Prior to Visit  Medication Sig Dispense Refill  . rosuvastatin (CRESTOR) 20 MG tablet Take 1 tablet (20 mg total) by mouth daily. 90 tablet 3  . cephALEXin (KEFLEX) 500 MG capsule Take 1 capsule (500 mg total) by mouth 2 (two) times daily. (Patient not taking: Reported on 09/08/2020) 14 capsule 0  . methocarbamol (ROBAXIN) 500 MG tablet Take 1 tablet (500 mg total) by mouth 4 (four) times daily. Prn muscle  spasm (Patient not taking: Reported on 09/08/2020) 90 tablet 0  . naproxen (NAPROSYN) 500 MG tablet Take 1 tablet (500 mg total) by mouth 2 (two) times daily with a meal. Prn pain (Patient not taking: Reported on 09/08/2020) 60 tablet 1   No facility-administered medications prior to visit.    Allergies  Allergen Reactions  . Bactrim [Sulfamethoxazole-Trimethoprim] Itching    ROS Review of Systems  Constitutional: Positive for fatigue. Negative for chills and fever.  HENT: Negative for sore throat and trouble swallowing.   Eyes: Negative for photophobia and visual disturbance.  Cardiovascular: Positive for chest pain (sensation of chest tightness about a week ago when she was feeling fatigued). Negative for palpitations.  Gastrointestinal: Negative for abdominal pain, diarrhea and nausea.  Endocrine: Negative for polydipsia, polyphagia and polyuria.  Genitourinary: Negative for dysuria and frequency.  Musculoskeletal: Positive for arthralgias and neck pain. Negative for back pain.  Neurological: Positive for headaches. Negative for dizziness.  Hematological: Negative for adenopathy. Does not bruise/bleed easily.  Psychiatric/Behavioral: Negative for suicidal ideas. The patient is nervous/anxious (gets anxious when busy/having to get a lot of things done).       Objective:    Physical Exam Constitutional:      General: She is not in acute distress.    Appearance: Normal appearance.  Eyes:     Extraocular Movements: Extraocular movements intact.     Conjunctiva/sclera: Conjunctivae normal.  Neck:     Comments: Tender at the occipital ridges of the scalp bilaterally Cardiovascular:     Rate and Rhythm: Normal rate and regular rhythm.  Pulmonary:     Effort: Pulmonary effort is normal.     Breath sounds: Normal breath sounds.  Abdominal:     Palpations: Abdomen is soft.     Tenderness: There is no abdominal tenderness. There is no guarding or rebound.  Musculoskeletal:         General: Tenderness (mild cervical paraspinous and trapezius spasm) present.     Cervical back: Normal range of motion and neck supple. No rigidity.  Lymphadenopathy:     Cervical: No cervical adenopathy.  Skin:    General: Skin is warm and dry.  Neurological:     General: No focal deficit present.     Mental Status: She is alert and oriented to person, place, and time.  Psychiatric:        Mood and Affect: Mood normal.        Behavior: Behavior normal.     BP 113/75 (BP Location: Left Arm, Patient Position: Sitting)   Pulse 83   Temp (!) 97 F (36.1  C)   Wt 148 lb (67.1 kg)   SpO2 100%   BMI 27.07 kg/m  Wt Readings from Last 3 Encounters:  09/08/20 148 lb (67.1 kg)  06/09/20 145 lb (65.8 kg)  03/01/20 149 lb (67.6 kg)     Health Maintenance Due  Topic Date Due  . Hepatitis C Screening  Never done  . COVID-19 Vaccine (1) Never done  . INFLUENZA VACCINE  06/18/2020  . PAP SMEAR-Modifier  07/19/2020     Lab Results  Component Value Date   TSH 3.050 03/01/2020   Lab Results  Component Value Date   WBC 8.1 12/09/2019   HGB 15.0 12/09/2019   HCT 43.6 12/09/2019   MCV 83 12/09/2019   PLT 447 12/09/2019   Lab Results  Component Value Date   NA 136 11/13/2019   K 3.9 11/13/2019   CO2 23 11/13/2019   GLUCOSE 89 11/13/2019   BUN 8 11/13/2019   CREATININE 0.63 11/13/2019   BILITOT 0.3 10/11/2019   ALKPHOS 58 10/11/2019   AST 11 10/11/2019   ALT 12 10/11/2019   PROT 7.0 10/11/2019   ALBUMIN 4.6 10/11/2019   CALCIUM 9.0 11/13/2019   ANIONGAP 7 11/13/2019   Lab Results  Component Value Date   CHOL 240 (H) 03/01/2020   Lab Results  Component Value Date   HDL 52 03/01/2020   Lab Results  Component Value Date   LDLCALC 161 (H) 03/01/2020   Lab Results  Component Value Date   TRIG 148 03/01/2020   Lab Results  Component Value Date   CHOLHDL 4.6 (H) 03/01/2020   Lab Results  Component Value Date   HGBA1C 5.5 07/31/2018      Assessment &  Plan:  1. Mixed hyperlipidemia She will have lipid panel at today's visit and follow-up of hyperlipidemia for which she is on rosuvastatin and she will have comprehensive metabolic panel in follow-up of statin therapy as rosuvastatin can affect creatinine as well as liver enzymes.. - Comprehensive metabolic panel - Lipid Panel  2. Encounter for long-term current use of medication Patient will have CMET in follow-up of long term use of RX and otc medications including statin therapy.  - Comprehensive metabolic panel  3. Tension headache Patient with tension type headaches based on description and examination.  Consider use of warm moist heat as well as nonsteroidal anti-inflammatories and muscle relaxants as needed.  Return for further evaluation if headaches are not improving. Educational material on tension headaches in Spanish provided as part of after visit summary.   4. Language barrier Video interpretation system used at today's visit to help with language barrier  5. Need for immunization against influenza Influenza immunization provided at today's visit as well as information in Spanish regarding the immunization - Flu Vaccine QUAD 36+ mos IM   Follow-up:  Return in about 4 months (around 01/09/2021) for chronic issues;Blood pressure: 2-3 week follow-up with Lurena Joiner for BP recheck.   Antony Blackbird, MD

## 2020-09-09 LAB — COMPREHENSIVE METABOLIC PANEL WITH GFR
ALT: 11 IU/L (ref 0–32)
AST: 10 IU/L (ref 0–40)
Albumin/Globulin Ratio: 1.8 (ref 1.2–2.2)
Albumin: 4.8 g/dL (ref 3.8–4.8)
Alkaline Phosphatase: 55 IU/L (ref 44–121)
BUN/Creatinine Ratio: 18 (ref 9–23)
BUN: 11 mg/dL (ref 6–24)
Bilirubin Total: 0.4 mg/dL (ref 0.0–1.2)
CO2: 22 mmol/L (ref 20–29)
Calcium: 9.6 mg/dL (ref 8.7–10.2)
Chloride: 102 mmol/L (ref 96–106)
Creatinine, Ser: 0.62 mg/dL (ref 0.57–1.00)
GFR calc Af Amer: 128 mL/min/1.73
GFR calc non Af Amer: 111 mL/min/1.73
Globulin, Total: 2.6 g/dL (ref 1.5–4.5)
Glucose: 97 mg/dL (ref 65–99)
Potassium: 4.4 mmol/L (ref 3.5–5.2)
Sodium: 137 mmol/L (ref 134–144)
Total Protein: 7.4 g/dL (ref 6.0–8.5)

## 2020-09-13 NOTE — Progress Notes (Signed)
Normal result letter generated, sent via MyChart and mailed to address on file. 

## 2020-09-22 ENCOUNTER — Encounter: Payer: Self-pay | Admitting: Pharmacist

## 2020-09-22 ENCOUNTER — Ambulatory Visit: Payer: No Typology Code available for payment source

## 2020-09-22 ENCOUNTER — Other Ambulatory Visit: Payer: Self-pay

## 2020-09-22 ENCOUNTER — Ambulatory Visit: Payer: No Typology Code available for payment source | Attending: Family Medicine | Admitting: Pharmacist

## 2020-09-22 VITALS — BP 119/82 | HR 84

## 2020-09-22 DIAGNOSIS — Z79899 Other long term (current) drug therapy: Secondary | ICD-10-CM

## 2020-09-22 DIAGNOSIS — Z0130 Encounter for examination of blood pressure without abnormal findings: Secondary | ICD-10-CM

## 2020-09-22 MED FILL — ROSUVASTATIN CALCIUM 20 MG: 20 | 30 days supply | Qty: 30 | Fill #1

## 2020-09-22 NOTE — Progress Notes (Signed)
   S:    Dr. Chapman Fitch   Patient arrives in good spirits. Presents to the clinic for hypertension evaluation, counseling, and management. Patient was referred and last seen by Primary Care Provider on 09/08/2020. She does not have a dx of HTN, but is concerned about elevated home BP readings.   Briefly, pt endorses neck pain that she describes as "a headache traveling down the back of her head to her neck." She denies dizziness, presyncope. Denies blurred vision. Denies chest pain or pressure; denies palpitations. She reports that her HA is worsened or provoked by anxiety. She notices her BP is elevated during these spells. Gives readings of 130s-140s during these episodes. She reports that her BP is "normal" most of time at home.   Medication adherence reported with current medications. Current BP Medications include:  none  Dietary habits include: admits to using salt with food; drinks 1 cup of coffee in the morning  Exercise habits include: none  Family / Social history:  - FHx: DM - Tobacco: never smoker  - Alcohol: denies use   O:  Vitals:   09/22/20 1114 09/22/20 1115  BP: 118/78 119/82  Pulse: 84 84  BP taken at 1115 (119/82) was taken with patient's home cuff  Home BP readings:  - Endorses normal readings of <120/80 most times  - Reports SBPs will occasionally reach 130-140s if she is stressed or has a HA  Last 3 Office BP readings: BP Readings from Last 3 Encounters:  09/22/20 119/82  09/08/20 113/75  07/06/20 111/70   BMET    Component Value Date/Time   NA 137 09/08/2020 1137   K 4.4 09/08/2020 1137   CL 102 09/08/2020 1137   CO2 22 09/08/2020 1137   GLUCOSE 97 09/08/2020 1137   GLUCOSE 89 11/13/2019 1427   BUN 11 09/08/2020 1137   CREATININE 0.62 09/08/2020 1137   CREATININE 0.70 11/07/2016 1050   CALCIUM 9.6 09/08/2020 1137   GFRNONAA 111 09/08/2020 1137   GFRNONAA >89 11/07/2016 1050   GFRAA 128 09/08/2020 1137   GFRAA >89 11/07/2016 1050    Renal  function: CrCl cannot be calculated (Unknown ideal weight.).  Clinical ASCVD: No  The 10-year ASCVD risk score Mikey Bussing DC Jr., et al., 2013) is: 0.9%   Values used to calculate the score:     Age: 45 years     Sex: Female     Is Non-Hispanic African American: No     Diabetic: No     Tobacco smoker: No     Systolic Blood Pressure: 725 mmHg     Is BP treated: No     HDL Cholesterol: 52 mg/dL     Total Cholesterol: 240 mg/dL  A/P: Hypertension undiagnosed currently normal without medications. Her headaches appear to be provoked by stress. We covered relaxation techniques. Also discussed importance of limiting salt in her diet as this can cause BP elevation. Home cuff appears to be accurate and home Bps are normal most of the time. Advised her to seek PCP care if symptoms persist or worsen.   -No medications needed at this time.    -Counseled on lifestyle modifications for blood pressure control including reduced dietary sodium, increased exercise, adequate sleep.  Results reviewed and written information provided.   Total time in face-to-face counseling 15 minutes.   F/U Clinic Visit w/ PCP.  Benard Halsted, PharmD, [place] [phone]

## 2020-09-23 LAB — HEPATIC FUNCTION PANEL
ALT: 19 IU/L (ref 0–32)
AST: 15 IU/L (ref 0–40)
Albumin: 4.7 g/dL (ref 3.8–4.8)
Alkaline Phosphatase: 57 IU/L (ref 44–121)
Bilirubin Total: 0.4 mg/dL (ref 0.0–1.2)
Bilirubin, Direct: <0.1 mg/dL (ref 0.00–0.40)
Total Protein: 8 g/dL (ref 6.0–8.5)

## 2020-09-23 LAB — LIPID PANEL
Chol/HDL Ratio: 5.7 ratio — ABNORMAL HIGH (ref 0.0–4.4)
Cholesterol, Total: 256 mg/dL — ABNORMAL HIGH (ref 100–199)
HDL: 45 mg/dL
LDL Chol Calc (NIH): 171 mg/dL — ABNORMAL HIGH (ref 0–99)
Triglycerides: 213 mg/dL — ABNORMAL HIGH (ref 0–149)
VLDL Cholesterol Cal: 40 mg/dL (ref 5–40)

## 2020-09-27 IMAGING — MR MR KNEE*R* W/O CM
5 of 7 series · 23 of 40 positions shown · non-contrast
Comparison: Radiographs dated 05/04/2020

CLINICAL DATA: Knee pain and instability after exercising 2 months
ago.

EXAM:
MRI OF THE RIGHT KNEE WITHOUT CONTRAST
TECHNIQUE: Multiplanar, multisequence MR imaging of the knee was performed. No
intravenous contrast was administered.

[Series 3: T2 fat-sat · axial · 4.0mm · 0.50mm/px · z∈[-64,+61]mm · 6 of 26 slices shown (1 of 2)]
[im 1/26]
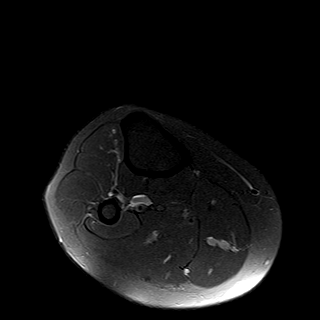
[im 6/26]
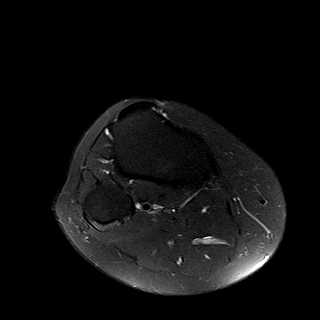
[im 11/26]
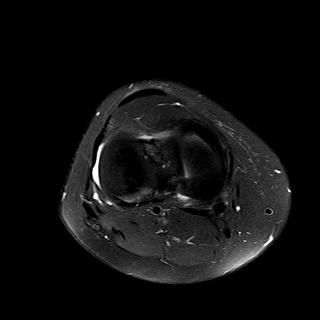
[im 16/26]
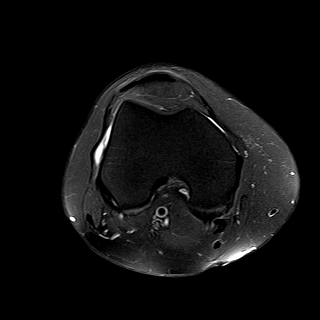
[im 21/26]
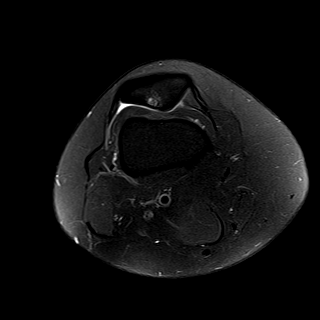
[im 26/26]
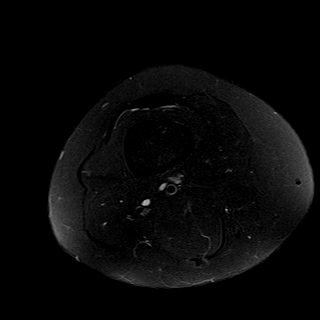

[Series 5: T2 fat-sat · coronal · 4.0mm · 0.29mm/px · 1 of 24 slices shown (2 of 2)]
[im 1/24]
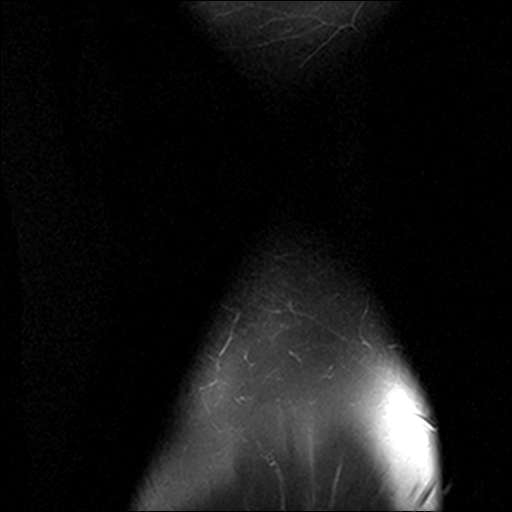

[Series 7: PD fat-sat · sagittal · 3.0mm · 0.29mm/px · 6 of 27 slices shown (1 of 3)]
[im 1/27]
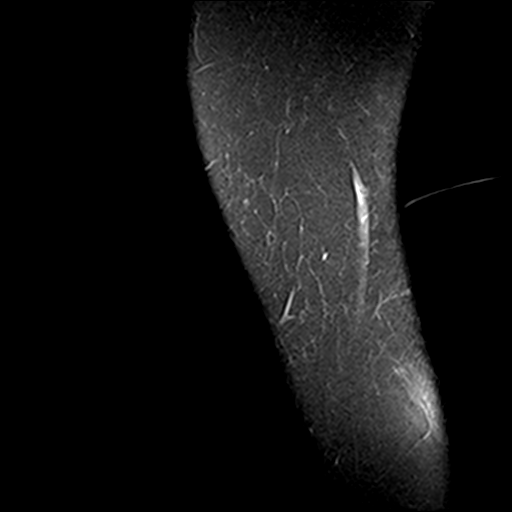
[im 6/27]
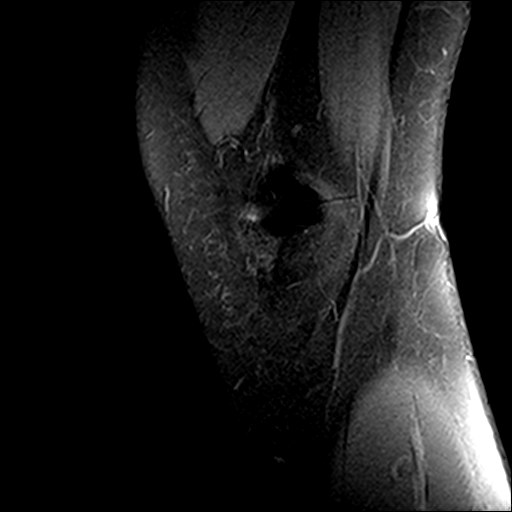
[im 11/27]
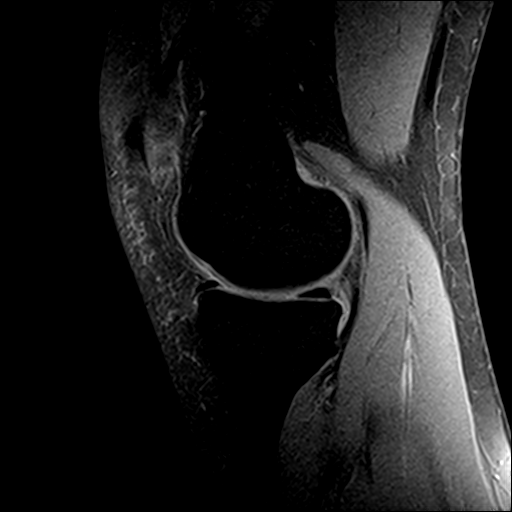
[im 16/27]
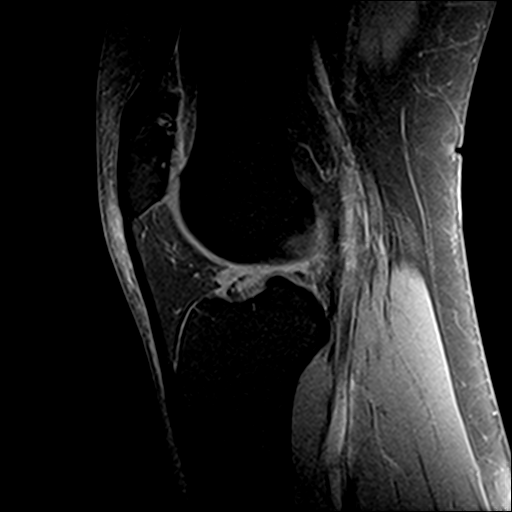
[im 21/27]
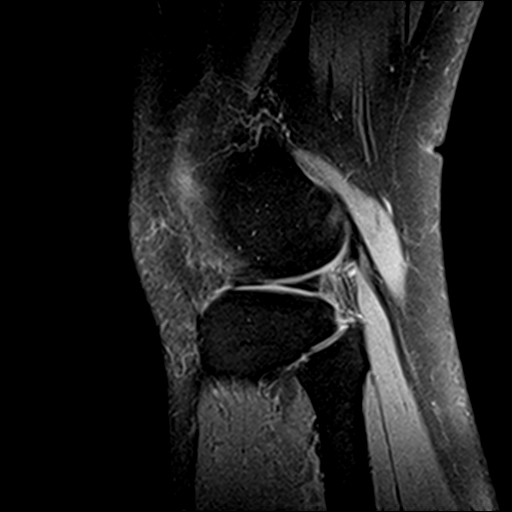
[im 27/27]
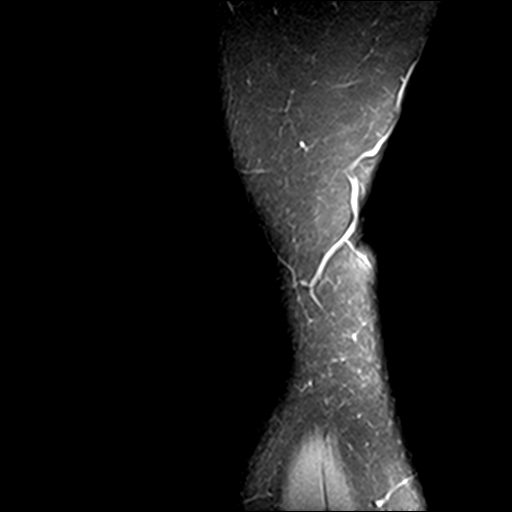

[Series 8: PD fat-sat · coronal · 3.0mm · 0.29mm/px · 7 of 30 slices shown (2 of 3)]
[im 1/30]
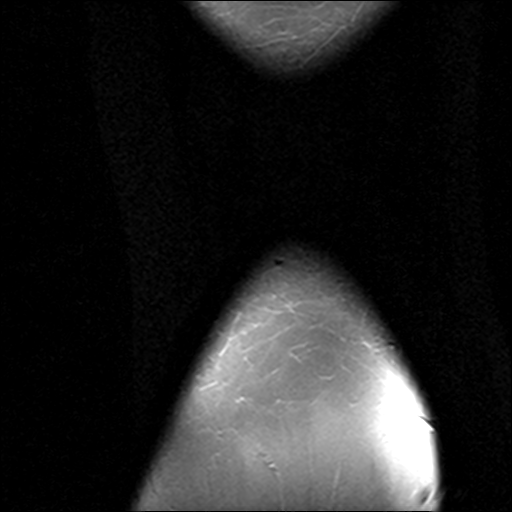
[im 5/30]
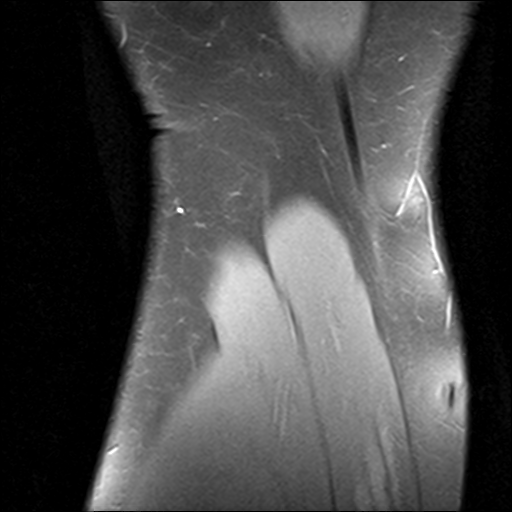
[im 10/30]
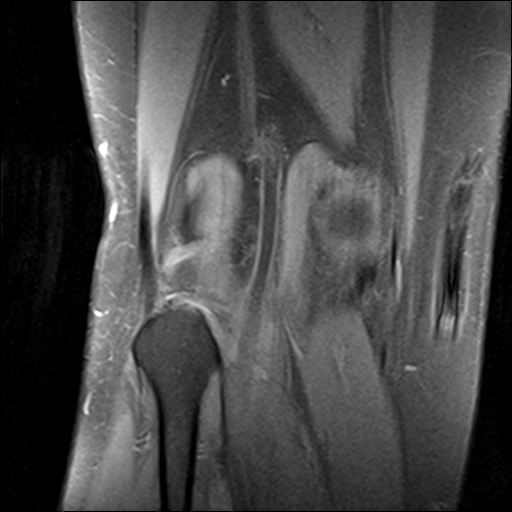
[im 15/30]
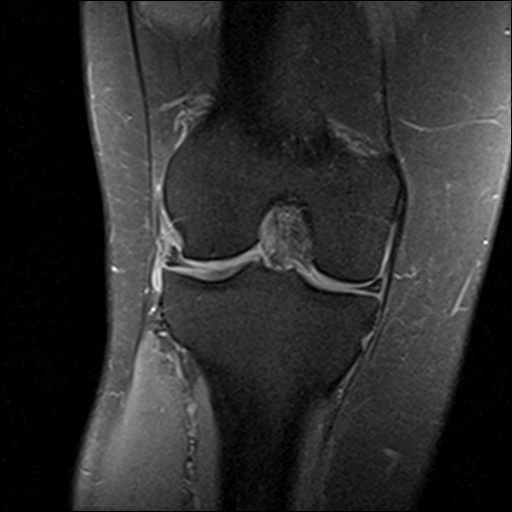
[im 20/30]
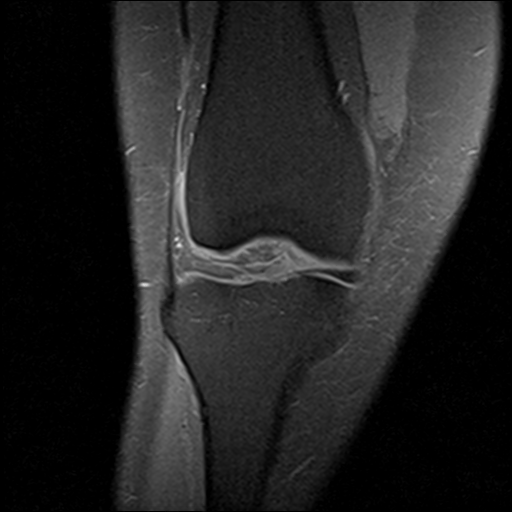
[im 25/30]
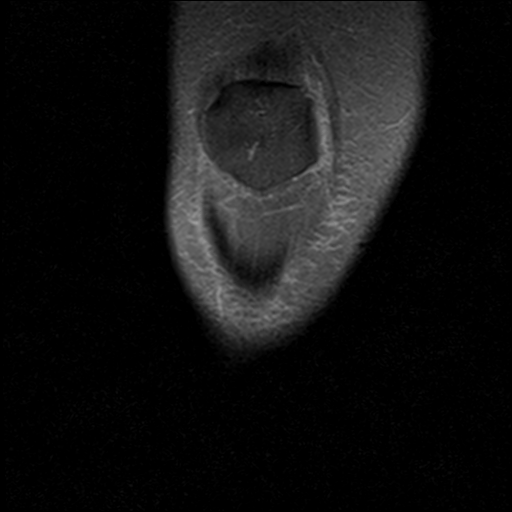
[im 30/30]
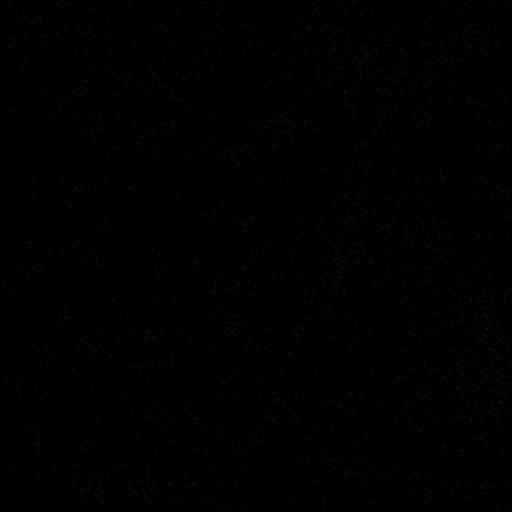

[Series 9: PD fat-sat · oblique · 2.3mm · 0.29mm/px · 3 of 11 slices shown (3 of 3)]
[im 1/11]
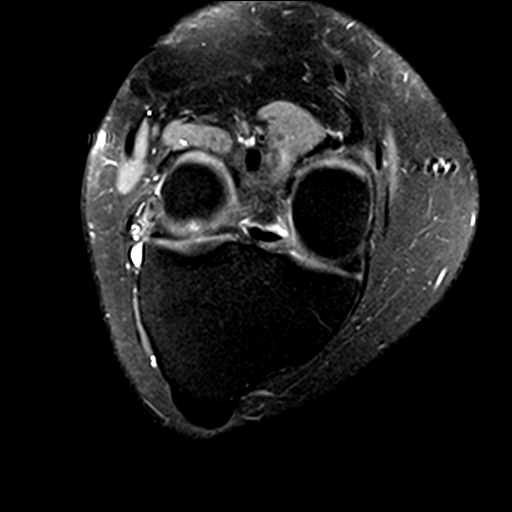
[im 6/11]
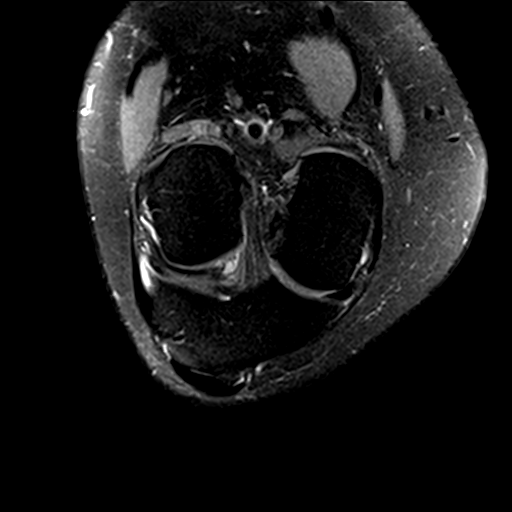
[im 11/11]
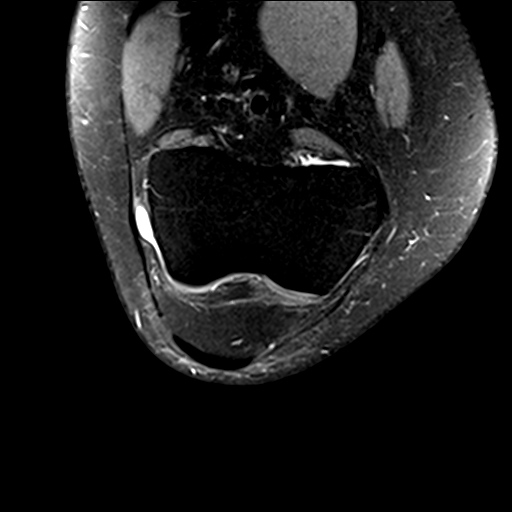

[23 of 40 positions shown; findings below may reference images not displayed]

FINDINGS: MENISCI

Medial meniscus:  Normal.

Lateral meniscus:  Normal.

LIGAMENTS

Cruciates:  Normal.

Collaterals:  Normal.

CARTILAGE

Patellofemoral: Focal full-thickness cartilage loss at the apex and
lateral facet of the patella.

Medial:  Normal.

Lateral: Focal 10 x 6 mm area of full-thickness cartilage loss of
the posterior central aspect of the lateral femoral condyle.

Joint:  Trace joint effusion.

Popliteal Fossa:  No Baker cyst. Intact popliteus tendon.

Extensor Mechanism:  Normal.

Bones: No focal marrow signal abnormality. No fracture or
dislocation.

Other: None
IMPRESSION: 1. Focal full-thickness cartilage loss at the apex and lateral facet
of the patella.
2. Focal full-thickness cartilage loss of the posterior central
aspect of the lateral femoral condyle.
3. Trace joint effusion.

## 2020-10-03 MED FILL — ROSUVASTATIN CALCIUM 20 MG: 20 | 30 days supply | Qty: 30 | Fill #1

## 2020-10-11 ENCOUNTER — Encounter: Payer: Self-pay | Admitting: Family Medicine

## 2020-12-15 LAB — GLUCOSE, POCT (MANUAL RESULT ENTRY): POC Glucose: 92 mg/dl (ref 70–99)

## 2021-01-08 ENCOUNTER — Other Ambulatory Visit: Payer: Self-pay

## 2021-01-08 DIAGNOSIS — N6310 Unspecified lump in the right breast, unspecified quadrant: Secondary | ICD-10-CM

## 2021-01-10 ENCOUNTER — Ambulatory Visit: Payer: No Typology Code available for payment source | Admitting: Nurse Practitioner

## 2021-01-10 ENCOUNTER — Ambulatory Visit: Payer: No Typology Code available for payment source | Admitting: Family Medicine

## 2021-01-14 ENCOUNTER — Encounter (HOSPITAL_COMMUNITY): Payer: Self-pay | Admitting: Emergency Medicine

## 2021-01-14 ENCOUNTER — Ambulatory Visit (HOSPITAL_COMMUNITY)
Admission: EM | Admit: 2021-01-14 | Discharge: 2021-01-14 | Disposition: A | Discharge status: Home / Self Care | Payer: No Typology Code available for payment source | Attending: Student | Admitting: Student

## 2021-01-14 ENCOUNTER — Other Ambulatory Visit: Payer: Self-pay

## 2021-01-14 DIAGNOSIS — R109 Unspecified abdominal pain: Secondary | ICD-10-CM

## 2021-01-14 DIAGNOSIS — Z3202 Encounter for pregnancy test, result negative: Secondary | ICD-10-CM

## 2021-01-14 DIAGNOSIS — R310 Gross hematuria: Secondary | ICD-10-CM | POA: Insufficient documentation

## 2021-01-14 DIAGNOSIS — M549 Dorsalgia, unspecified: Secondary | ICD-10-CM

## 2021-01-14 DIAGNOSIS — Z87440 Personal history of urinary (tract) infections: Secondary | ICD-10-CM | POA: Insufficient documentation

## 2021-01-14 DIAGNOSIS — N200 Calculus of kidney: Secondary | ICD-10-CM

## 2021-01-14 LAB — POCT URINALYSIS DIPSTICK, ED / UC
Bilirubin Urine: NEGATIVE
Glucose, UA: NEGATIVE mg/dL
Ketones, ur: NEGATIVE mg/dL
Leukocytes,Ua: NEGATIVE
Nitrite: NEGATIVE
Protein, ur: NEGATIVE mg/dL
Specific Gravity, Urine: 1.02 (ref 1.005–1.030)
Urobilinogen, UA: 0.2 mg/dL (ref 0.0–1.0)
pH: 5.5 (ref 5.0–8.0)

## 2021-01-14 LAB — POC URINE PREG, ED: Preg Test, Ur: NEGATIVE

## 2021-01-14 NOTE — Discharge Instructions (Addendum)
-  For pain, ibuprofen 800mg  up to 3x daily.  -you can also take tylenol 1000mg  up to 3x daily.  -If your pain gets worse instead of better, head to the ED. -We'll call you if there is any bacteria in your urine. We can send additional medication if necessary.

## 2021-01-14 NOTE — ED Provider Notes (Addendum)
[place]    CSN: [id number] Arrival date & time: 01/14/21  1004      History   Chief Complaint Chief Complaint  Patient presents with  . Back Pain  . Abdominal Pain    HPI Alicia Miller is a 45 y.o. female presenting with 1 week of back pain.  History of adenocarcinoma of cervix, cancer, depression, laryngeal spasm chronicles palsy, cervicalgia. History of both lumbar strain, kidney stones, and UTI (last 06/2020).  Endorses 1 week of lower back pain radiating to groin, and hematuria that is improving on its own.  Denies trauma, new exercises, heavy lifting, etc.  Denies  dysuria, frequency, urgency,  n/v/d/abd pain, fevers/chills, abdnormal vaginal discharge, new partners. Last period was 10 days ago.  HPI  Past Medical History:  Diagnosis Date  . 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 Cancer (HCC)    Cervical  . Depression   . Laryngospasm 12/10/2016  . Left-sided Bell's palsy 01/19/2016  . 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 No pertinent past medical history   . Varicose veins of left leg with edema     Patient Active Problem List   Diagnosis Date Noted  . Vasculitis ([place]) 12/17/2019  . Martin-Gruber anastomosis ([place]) Right 11/16/2019  . Mood changes 07/28/2019  . Dyspareunia in female 07/28/2019  . Menorrhagia with regular cycle 07/28/2019  . S/P cholecystectomy 07/28/2019  . Nevus, non-neoplastic 08/06/2017  . Dermatofibroma 08/06/2017  . Seborrheic keratoses 06/12/2017  . 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 Pruritic erythematous rash 06/12/2017  . Varicose veins of left lower extremity with complications 71/04/2693  . Vitamin D insufficiency 08/16/2016  . Joint laxity of right knee 08/15/2016  . Chronic pain of left ankle 08/15/2016  . Chronic pain of right ankle 08/15/2016  . Fatigue 08/15/2016  .  Stress incontinence, female 02/12/2016  . Breast tenderness in female 02/12/2016  . Allergic rhinitis 08/11/2015  . Snoring 08/11/2015  . Laryngopharyngeal reflux 08/11/2015    Past Surgical History:  Procedure Laterality Date  . CHOLECYSTECTOMY  05/2015  . ENDOVENOUS ABLATION SAPHENOUS VEIN W/ LASER Left 12/08/2017   endovenous laser ablation left greater saphenous vein and stab phlebectomy 10-20 incisions left leg by Tinnie Gens MD     OB History    Gravida  2   Para  2   Term  2   Preterm  0   AB  0   Living  2     SAB  0   IAB  0   Ectopic      Multiple  0   Live Births  2            Home Medications    Prior to Admission medications   Medication Sig Start Date End Date Taking? Authorizing Provider  rosuvastatin (CRESTOR) 20 MG tablet Take 1 tablet (20 mg total) by mouth daily. 06/15/20  Yes McClung, Dionne Bucy, PA-C  cephALEXin (KEFLEX) 500 MG capsule Take 1 capsule (500 mg total) by mouth 2 (two) times daily. Patient not taking: Reported on 09/08/2020 07/06/20   Jaynee Eagles, PA-C  methocarbamol (ROBAXIN) 500 MG tablet Take 1 tablet (500 mg total) by mouth 4 (four) times daily. Prn muscle spasm Patient not taking: Reported on 09/08/2020 06/15/20   Argentina Donovan, PA-C  naproxen (NAPROSYN) 500 MG tablet Take  1 tablet (500 mg total) by mouth 2 (two) times daily with a meal. Prn pain Patient not taking: Reported on 09/08/2020 06/15/20   Argentina Donovan, PA-C    Family History Family History  Problem Relation Age of Onset  . Diabetes Father   . Cancer Sister        uterine  . Diabetes Sister   . Diabetes Paternal Grandmother   . Diabetes Paternal Uncle   . Diabetes Paternal Uncle   . Diabetes Paternal Uncle   . Diabetes Paternal Uncle   . Diabetes Paternal Uncle   . Diabetes Paternal Uncle     Social History Social History   Tobacco Use  . Smoking status: Never Smoker  . Smokeless tobacco: Never Used  Vaping Use  . Vaping Use: Never used   Substance Use Topics  . Alcohol use: No  . Drug use: No     Allergies   Bactrim [sulfamethoxazole-trimethoprim]   Review of Systems Review of Systems  Constitutional: Negative for chills, fever and unexpected weight change.  Respiratory: Negative for chest tightness and shortness of breath.   Cardiovascular: Negative for chest pain and palpitations.  Gastrointestinal: Negative for abdominal pain, diarrhea, nausea and vomiting.  Genitourinary: Positive for flank pain and hematuria. Negative for decreased urine volume, difficulty urinating, dyspareunia, dysuria, enuresis, frequency, genital sores, menstrual problem, pelvic pain, urgency, vaginal bleeding, vaginal discharge and vaginal pain.  Musculoskeletal: Negative for arthralgias, back pain, gait problem, joint swelling, myalgias, neck pain and neck stiffness.  Skin: Negative for wound.  Neurological: Negative for dizziness, tremors, seizures, syncope, facial asymmetry, speech difficulty, weakness, light-headedness, numbness and headaches.  All other systems reviewed and are negative.    Physical Exam Triage Vital Signs ED Triage Vitals  Enc Vitals Group     BP 01/14/21 1026 128/79     Pulse Rate 01/14/21 1026 73     Resp 01/14/21 1026 16     Temp 01/14/21 1026 98.3 F (36.8 C)     Temp Source 01/14/21 1026 Oral     SpO2 01/14/21 1026 98 %     Weight --      Height --      Head Circumference --      Peak Flow --      Pain Score 01/14/21 1028 6     Pain Loc --      Pain Edu? --      Excl. in [place]? --    No data found.  Updated Vital Signs BP 128/79 (BP Location: Left Arm)   Pulse 73   Temp 98.3 F (36.8 C) (Oral)   Resp 16   LMP 01/05/2021   SpO2 98%   Visual Acuity Right Eye Distance:   Left Eye Distance:   Bilateral Distance:    Right Eye Near:   Left Eye Near:    Bilateral Near:     Physical Exam Vitals reviewed.  Constitutional:      General: She is not in acute distress.    Appearance: Normal  appearance. She is not ill-appearing.  HENT:     Head: Normocephalic and atraumatic.  Cardiovascular:     Rate and Rhythm: Normal rate and regular rhythm.     Heart sounds: Normal heart sounds.  Pulmonary:     Effort: Pulmonary effort is normal.     Breath sounds: Normal breath sounds and air entry.  Abdominal:     Tenderness: There is no abdominal tenderness. There is no right CVA tenderness,  left CVA tenderness, guarding or rebound. Negative signs include Murphy's sign, Rovsing's sign and McBurney's sign.     Comments: No bowel or bladder incontinence.  Musculoskeletal:     Cervical back: Normal range of motion. No swelling, deformity, signs of trauma, rigidity, spasms, tenderness, bony tenderness or crepitus. No pain with movement.     Thoracic back: No swelling, deformity, signs of trauma, spasms, tenderness or bony tenderness. Normal range of motion. No scoliosis.     Lumbar back: No swelling, deformity, signs of trauma, spasms, tenderness or bony tenderness. Normal range of motion. Negative right straight leg raise test and negative left straight leg raise test. No scoliosis.     Comments: Strength 5/5 in UEs and LEs.  Neurological:     General: No focal deficit present.     Mental Status: She is alert.     Cranial Nerves: No cranial nerve deficit.     Comments: Strength 5/5 in UEs and LEs. Gait normal. Sensation intact in UEs and LEs.   Psychiatric:        Mood and Affect: Mood normal.        Behavior: Behavior normal.        Thought Content: Thought content normal.        Judgment: Judgment normal.      UC Treatments / Results  Labs (all labs ordered are listed, but only abnormal results are displayed) Labs Reviewed  POCT URINALYSIS DIPSTICK, ED / UC - Abnormal; Notable for the following components:      Result Value   Hgb urine dipstick LARGE (*)    All other components within normal limits  URINE CULTURE  POC URINE PREG, ED    EKG   Radiology No results  found.  Procedures Procedures (including critical care time)  Medications Ordered in UC Medications - No data to display  Initial Impression / Assessment and Plan / UC Course  I have reviewed the triage vital signs and the nursing notes.  Pertinent labs & imaging results that were available during my care of the patient were reviewed by me and considered in my medical decision making (see chart for details).     This patient is a 45 year old female presenting with back pain x1 week and hematuria. History kidney stones and UTIs.   UA today with blood. Culture sent. Urine pregnancy negative.  Will treat for kidney stone conservatively with ibuprofen and tylenol. rec good hydration. If symptoms worsen or persist, head straight to ED.  Using interpreter, spent over 40 minutes obtaining H&P, performing physical, discussing results, treatment plan and plan for follow-up with patient. Patient agrees with plan.   Final Clinical Impressions(s) / UC Diagnoses   Final diagnoses:  Gross hematuria  Kidney stone  History of UTI     Discharge Instructions     -For pain, ibuprofen 800mg  up to 3x daily.  -you can also take tylenol 1000mg  up to 3x daily.  -If your pain gets worse instead of better, head to the ED. -We'll call you if there is any bacteria in your urine. We can send additional medication if necessary.    ED Prescriptions    None     I have reviewed the PDMP during this encounter.   Hazel Sams, PA-C 01/14/21 1057    Hazel Sams, PA-C 01/14/21 1103

## 2021-01-14 NOTE — ED Triage Notes (Addendum)
[place] interpreter used.  Pt c/o of lower back pain x 1 week. She also reports having blood in urine and lower abdominal pain as well.

## 2021-01-15 ENCOUNTER — Emergency Department (HOSPITAL_COMMUNITY)
Admission: EM | Admit: 2021-01-15 | Discharge: 2021-01-15 | Disposition: A | Discharge status: Home / Self Care | Payer: No Typology Code available for payment source | Attending: Emergency Medicine | Admitting: Emergency Medicine

## 2021-01-15 ENCOUNTER — Encounter (HOSPITAL_COMMUNITY): Payer: Self-pay | Admitting: Emergency Medicine

## 2021-01-15 ENCOUNTER — Encounter (HOSPITAL_COMMUNITY): Payer: Self-pay

## 2021-01-15 ENCOUNTER — Other Ambulatory Visit: Payer: Self-pay

## 2021-01-15 ENCOUNTER — Emergency Department (HOSPITAL_COMMUNITY): Payer: No Typology Code available for payment source

## 2021-01-15 ENCOUNTER — Emergency Department (HOSPITAL_COMMUNITY)
Admission: EM | Admit: 2021-01-15 | Discharge: 2021-01-16 | Disposition: A | Discharge status: Home / Self Care | Payer: No Typology Code available for payment source | Attending: Emergency Medicine | Admitting: Emergency Medicine

## 2021-01-15 ENCOUNTER — Other Ambulatory Visit (HOSPITAL_COMMUNITY): Payer: Self-pay | Admitting: Emergency Medicine

## 2021-01-15 DIAGNOSIS — Z86001 Personal history of in-situ neoplasm of cervix uteri: Secondary | ICD-10-CM | POA: Insufficient documentation

## 2021-01-15 DIAGNOSIS — N200 Calculus of kidney: Secondary | ICD-10-CM

## 2021-01-15 DIAGNOSIS — R109 Unspecified abdominal pain: Secondary | ICD-10-CM | POA: Insufficient documentation

## 2021-01-15 DIAGNOSIS — Z5321 Procedure and treatment not carried out due to patient leaving prior to being seen by health care provider: Secondary | ICD-10-CM | POA: Insufficient documentation

## 2021-01-15 DIAGNOSIS — Z8541 Personal history of malignant neoplasm of cervix uteri: Secondary | ICD-10-CM | POA: Insufficient documentation

## 2021-01-15 LAB — URINALYSIS, ROUTINE W REFLEX MICROSCOPIC
Bacteria, UA: NONE SEEN
Bilirubin Urine: NEGATIVE
Bilirubin Urine: NEGATIVE
Glucose, UA: NEGATIVE mg/dL
Glucose, UA: NEGATIVE mg/dL
Ketones, ur: NEGATIVE mg/dL
Ketones, ur: 20 mg/dL — AB
Leukocytes,Ua: NEGATIVE
Leukocytes,Ua: NEGATIVE
Nitrite: NEGATIVE
Nitrite: NEGATIVE
Protein, ur: NEGATIVE mg/dL
Protein, ur: 30 mg/dL — AB
RBC / HPF: >50 RBC/hpf — ABNORMAL HIGH (ref 0–5)
RBC / HPF: >50 RBC/hpf — ABNORMAL HIGH (ref 0–5)
Specific Gravity, Urine: 1.013 (ref 1.005–1.030)
Specific Gravity, Urine: 1.032 — ABNORMAL HIGH (ref 1.005–1.030)
pH: 5 (ref 5.0–8.0)
pH: 5 (ref 5.0–8.0)

## 2021-01-15 LAB — CBC WITH DIFFERENTIAL/PLATELET
Abs Immature Granulocytes: 0.04 10*3/uL (ref 0.00–0.07)
Basophils Absolute: 0.1 10*3/uL (ref 0.0–0.1)
Basophils Relative: 1 %
Eosinophils Absolute: 0.4 10*3/uL (ref 0.0–0.5)
Eosinophils Relative: 5 %
HCT: 44.9 % (ref 36.0–46.0)
Hemoglobin: 14.7 g/dL (ref 12.0–15.0)
Immature Granulocytes: 1 %
Lymphocytes Relative: 28 %
Lymphs Abs: 2.2 10*3/uL (ref 0.7–4.0)
MCH: 28 pg (ref 26.0–34.0)
MCHC: 32.7 g/dL (ref 30.0–36.0)
MCV: 85.5 fL (ref 80.0–100.0)
Monocytes Absolute: 0.5 10*3/uL (ref 0.1–1.0)
Monocytes Relative: 6 %
Neutro Abs: 4.7 10*3/uL (ref 1.7–7.7)
Neutrophils Relative %: 59 %
Platelets: 383 10*3/uL (ref 150–400)
RBC: 5.25 MIL/uL — ABNORMAL HIGH (ref 3.87–5.11)
RDW: 12.7 % (ref 11.5–15.5)
WBC: 7.9 10*3/uL (ref 4.0–10.5)
nRBC: 0 % (ref 0.0–0.2)

## 2021-01-15 LAB — COMPREHENSIVE METABOLIC PANEL
ALT: 16 U/L (ref 0–44)
AST: 17 U/L (ref 15–41)
Albumin: 4.4 g/dL (ref 3.5–5.0)
Alkaline Phosphatase: 40 U/L (ref 38–126)
Anion gap: 8 (ref 5–15)
BUN: 10 mg/dL (ref 6–20)
CO2: 26 mmol/L (ref 22–32)
Calcium: 9.1 mg/dL (ref 8.9–10.3)
Chloride: 103 mmol/L (ref 98–111)
Creatinine, Ser: 0.6 mg/dL (ref 0.44–1.00)
GFR, Estimated: >60 mL/min (ref >60)
Glucose, Bld: 95 mg/dL (ref 70–99)
Potassium: 3.7 mmol/L (ref 3.5–5.1)
Sodium: 137 mmol/L (ref 135–145)
Total Bilirubin: 0.8 mg/dL (ref 0.3–1.2)
Total Protein: 7.6 g/dL (ref 6.5–8.1)

## 2021-01-15 LAB — URINE CULTURE

## 2021-01-15 LAB — HCG, QUANTITATIVE, PREGNANCY: hCG, Beta Chain, Quant, S: <1 m[IU]/mL (ref <5)

## 2021-01-15 LAB — PREGNANCY, URINE: Preg Test, Ur: NEGATIVE

## 2021-01-15 MED ORDER — TAMSULOSIN HCL 0.4 MG PO CAPS: 0.4000 mg | ORAL_CAPSULE | Freq: Every day | ORAL | 0 refills | Status: DC

## 2021-01-15 MED ORDER — ONDANSETRON HCL 4 MG/2ML IJ SOLN
4.0000 mg | Freq: Once | INTRAMUSCULAR | Status: AC
  Administered 2021-01-15: 4 mg via INTRAVENOUS
  Filled 2021-01-15: qty 2

## 2021-01-15 MED ORDER — OXYCODONE-ACETAMINOPHEN 5-325 MG PO TABS: 1.0000 | ORAL_TABLET | Freq: Four times a day (QID) | ORAL | 0 refills | Status: DC | PRN

## 2021-01-15 MED ORDER — KETOROLAC TROMETHAMINE 30 MG/ML IJ SOLN
30.0000 mg | Freq: Once | INTRAMUSCULAR | Status: AC
  Administered 2021-01-15: 30 mg via INTRAVENOUS
  Filled 2021-01-15: qty 1

## 2021-01-15 MED ORDER — ONDANSETRON 4 MG PO TBDP: ORAL_TABLET | ORAL | 0 refills | Status: DC

## 2021-01-15 MED FILL — TAMSULOSIN HCL 0.4 MG CAP: 0.4 | 5 days supply | Qty: 5 | Fill #0

## 2021-01-15 NOTE — ED Triage Notes (Signed)
Patient c/o hematuria, low back pain and. Lower abdominal pain x 1 week. Patient reports that she has a known kidney stone.

## 2021-01-15 NOTE — ED Notes (Signed)
Eloped from waiting area. Presented to [facility] ED.

## 2021-01-15 NOTE — Discharge Instructions (Addendum)
Follow-up with Dr. Jeffie Pollock or one of his colleagues in urology in the next couple weeks

## 2021-01-15 NOTE — ED Triage Notes (Signed)
Pt c/o bilateral flank pain and visibly holding right flank. Pt seen and discharged today. Given Rx but unable to get them due to pharmacy closing.

## 2021-01-15 NOTE — ED Triage Notes (Signed)
Pt c/o right sided flank pain that started two days ago.  Diagnosed w/ Kidney stones, pain is unbearable.

## 2021-01-15 NOTE — ED Provider Notes (Signed)
[place] DEPT Provider Note   CSN: [id number] Arrival date & time: 01/15/21  1001     History Chief Complaint  Patient presents with  . Hematuria    Alicia Miller is a 45 y.o. female.  Patient complains of left flank pain.  Patient has a history of kidney stones.  No fever no chills no vomiting  The history is provided by the patient. No language interpreter was used.  Flank Pain This is a new problem. The current episode started 6 to 12 hours ago. The problem occurs constantly. The problem has not changed since onset.Pertinent negatives include no chest pain, no abdominal pain and no headaches. Nothing aggravates the symptoms. Nothing relieves the symptoms. She has tried nothing for the symptoms. The treatment provided no relief.       Past Medical History:  Diagnosis Date  . 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 Cancer (HCC)    Cervical  . Depression   . Laryngospasm 12/10/2016  . Left-sided Bell's palsy 01/19/2016  . 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 No pertinent past medical history   . Varicose veins of left leg with edema     Patient Active Problem List   Diagnosis Date Noted  . Vasculitis ([place]) 12/17/2019  . Martin-Gruber anastomosis ([place]) Right 11/16/2019  . Mood changes 07/28/2019  . Dyspareunia in female 07/28/2019  . Menorrhagia with regular cycle 07/28/2019  . S/P cholecystectomy 07/28/2019  . Nevus, non-neoplastic 08/06/2017  . Dermatofibroma 08/06/2017  . Seborrheic keratoses 06/12/2017  . 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 Pruritic erythematous rash 06/12/2017  . Varicose veins of left lower extremity with complications 58/85/0277  . Vitamin D insufficiency 08/16/2016  . Joint laxity of right knee 08/15/2016  . Chronic pain of left ankle 08/15/2016  . Chronic pain of  right ankle 08/15/2016  . Fatigue 08/15/2016  . Stress incontinence, female 02/12/2016  . Breast tenderness in female 02/12/2016  . Allergic rhinitis 08/11/2015  . Snoring 08/11/2015  . Laryngopharyngeal reflux 08/11/2015    Past Surgical History:  Procedure Laterality Date  . CHOLECYSTECTOMY  05/2015  . ENDOVENOUS ABLATION SAPHENOUS VEIN W/ LASER Left 12/08/2017   endovenous laser ablation left greater saphenous vein and stab phlebectomy 10-20 incisions left leg by Tinnie Gens MD      OB History    Gravida  2   Para  2   Term  2   Preterm  0   AB  0   Living  2     SAB  0   IAB  0   Ectopic      Multiple  0   Live Births  2           Family History  Problem Relation Age of Onset  . Diabetes Father   . Cancer Sister        uterine  . Diabetes Sister   . Diabetes Paternal Grandmother   . Diabetes Paternal Uncle   . Diabetes Paternal Uncle   . Diabetes Paternal Uncle   . Diabetes Paternal Uncle   . Diabetes Paternal Uncle   . Diabetes Paternal Uncle     Social History   Tobacco Use  . Smoking status: Never Smoker  . Smokeless tobacco: Never Used  Vaping Use  . Vaping Use: Never used  Substance Use Topics  . Alcohol use: No  . Drug use: No    Home Medications Prior to Admission medications   Medication Sig Start Date End Date Taking? Authorizing Provider  ondansetron (ZOFRAN ODT) 4 MG disintegrating tablet 4mg  ODT q4 hours prn nausea/vomit 01/15/21  Yes Milton Ferguson, MD  oxyCODONE-acetaminophen (PERCOCET) 5-325 MG tablet Take 1 tablet by mouth every 6 (six) hours as needed. 01/15/21  Yes Milton Ferguson, MD  tamsulosin (FLOMAX) 0.4 MG CAPS capsule Take 1 capsule (0.4 mg total) by mouth daily. 01/15/21  Yes Milton Ferguson, MD  cephALEXin (KEFLEX) 500 MG capsule Take 1 capsule (500 mg total) by mouth 2 (two) times daily. Patient not taking: Reported on 09/08/2020 07/06/20   Jaynee Eagles, PA-C  methocarbamol (ROBAXIN) 500 MG tablet Take 1 tablet  (500 mg total) by mouth 4 (four) times daily. Prn muscle spasm Patient not taking: Reported on 09/08/2020 06/15/20   Argentina Donovan, PA-C  naproxen (NAPROSYN) 500 MG tablet Take 1 tablet (500 mg total) by mouth 2 (two) times daily with a meal. Prn pain Patient not taking: Reported on 09/08/2020 06/15/20   Argentina Donovan, PA-C  rosuvastatin (CRESTOR) 20 MG tablet Take 1 tablet (20 mg total) by mouth daily. 06/15/20   Argentina Donovan, PA-C    Allergies    Bactrim [sulfamethoxazole-trimethoprim]  Review of Systems   Review of Systems  Constitutional: Negative for appetite change and fatigue.  HENT: Negative for congestion, ear discharge and sinus pressure.   Eyes: Negative for discharge.  Respiratory: Negative for cough.   Cardiovascular: Negative for chest pain.  Gastrointestinal: Negative for abdominal pain and diarrhea.  Genitourinary: Positive for flank pain. Negative for frequency and hematuria.  Musculoskeletal: Negative for back pain.  Skin: Negative for rash.  Neurological: Negative for seizures and headaches.  Psychiatric/Behavioral: Negative for hallucinations.    Physical Exam Updated Vital Signs BP 118/82   Pulse 72   Temp 98.8 F (37.1 C) (Oral)   Resp 14   Ht 5\' 2"  (1.575 m)   Wt 66.7 kg   LMP 01/05/2021 Comment: neg preg test 2.27.2022  SpO2 99%   BMI 26.89 kg/m   Physical Exam Vitals and nursing note reviewed.  Constitutional:      Appearance: She is well-developed.  HENT:     Head: Normocephalic.     Nose: Nose normal.  Eyes:     General: No scleral icterus.    Extraocular Movements: EOM normal.     Conjunctiva/sclera: Conjunctivae normal.  Neck:     Thyroid: No thyromegaly.  Cardiovascular:     Rate and Rhythm: Normal rate and regular rhythm.     Heart sounds: No murmur heard. No friction rub. No gallop.   Pulmonary:     Breath sounds: No stridor. No wheezing or rales.  Chest:     Chest wall: No tenderness.  Abdominal:     General:  There is no distension.     Tenderness: There is no abdominal tenderness. There is no rebound.  Musculoskeletal:        General: No edema. Normal range of motion.     Cervical back: Neck supple.     Comments: Tender left flank  Lymphadenopathy:     Cervical: No cervical adenopathy.  Skin:    Findings: No erythema or rash.  Neurological:     Mental Status: She is alert and oriented to person, place, and time.     Motor: No abnormal muscle tone.  Coordination: Coordination normal.  Psychiatric:        Mood and Affect: Mood and affect normal.        Behavior: Behavior normal.     ED Results / Procedures / Treatments   Labs (all labs ordered are listed, but only abnormal results are displayed) Labs Reviewed  CBC WITH DIFFERENTIAL/PLATELET - Abnormal; Notable for the following components:      Result Value   RBC 5.25 (*)    All other components within normal limits  URINALYSIS, ROUTINE W REFLEX MICROSCOPIC - Abnormal; Notable for the following components:   Hgb urine dipstick LARGE (*)    RBC / HPF >50 (*)    Bacteria, UA RARE (*)    All other components within normal limits  COMPREHENSIVE METABOLIC PANEL  HCG, QUANTITATIVE, PREGNANCY    EKG None  Radiology CT Renal Stone Study  Result Date: 01/15/2021 CLINICAL DATA:  Hematuria with low back and flank pain. EXAM: CT ABDOMEN AND PELVIS WITHOUT CONTRAST TECHNIQUE: Multidetector CT imaging of the abdomen and pelvis was performed following the standard protocol without IV contrast. COMPARISON:  11/14/2016 FINDINGS: Lower chest: Unremarkable Hepatobiliary: No focal abnormality in the liver on this study without intravenous contrast. Gallbladder is surgically absent. No intrahepatic or extrahepatic biliary dilation. Pancreas: No focal mass lesion. No dilatation of the main duct. No intraparenchymal cyst. No peripancreatic edema. Spleen: No splenomegaly. No focal mass lesion. Adrenals/Urinary Tract: No adrenal nodule or mass. 2 mm  nonobstructing stone noted upper pole right kidney with 4 mm nonobstructing stone noted lower pole right kidney. Mild fullness of the right intrarenal collecting system noted with 2 x 2 x 5 mm stone at the right UPJ (26/2). Right ureter unremarkable. No stones are seen in the left kidney or ureter. No bladder stones. Stomach/Bowel: Stomach is unremarkable. No gastric wall thickening. No evidence of outlet obstruction. Duodenum is normally positioned as is the ligament of Treitz. No small bowel wall thickening. No small bowel dilatation. The terminal ileum is normal. The appendix is normal. No gross colonic mass. No colonic wall thickening. A few scattered diverticuli are seen in the left colon without diverticulitis. Vascular/Lymphatic: No abdominal aortic aneurysm. No abdominal aortic atherosclerotic calcification. There is no gastrohepatic or hepatoduodenal ligament lymphadenopathy. No retroperitoneal or mesenteric lymphadenopathy. No pelvic sidewall lymphadenopathy. Reproductive: Unremarkable. Other: No intraperitoneal free fluid. Musculoskeletal: No worrisome lytic or sclerotic osseous abnormality. IMPRESSION: 1. 2 x 2 x 5 mm right UPJ stone with mild fullness of the right intrarenal collecting system. 2. Additional nonobstructing right renal stones. Electronically Signed   By: Misty Stanley M.D.   On: 01/15/2021 12:44    Procedures Procedures   Medications Ordered in ED Medications  ondansetron (ZOFRAN) injection 4 mg (4 mg Intravenous Given 01/15/21 1119)  ketorolac (TORADOL) 30 MG/ML injection 30 mg (30 mg Intravenous Given 01/15/21 1120)    ED Course  I have reviewed the triage vital signs and the nursing notes.  Pertinent labs & imaging results that were available during my care of the patient were reviewed by me and considered in my medical decision making (see chart for details).    MDM Rules/Calculators/A&P                         Patient with a kidney stone on the left UPJ.  She is  given pain medicine nausea medicine and Flomax and will follow up with urology  Final Clinical Impression(s) / ED Diagnoses Final diagnoses:  Kidney stone    Rx / DC Orders ED Discharge Orders         Ordered    ondansetron (ZOFRAN ODT) 4 MG disintegrating tablet        01/15/21 1511    oxyCODONE-acetaminophen (PERCOCET) 5-325 MG tablet  Every 6 hours PRN        01/15/21 1511    tamsulosin (FLOMAX) 0.4 MG CAPS capsule  Daily        01/15/21 1511           Milton Ferguson, MD 01/18/21 [id number]

## 2021-01-16 MED ORDER — SODIUM CHLORIDE 0.9 % IV BOLUS
1000.0000 mL | Freq: Once | INTRAVENOUS | Status: AC
  Administered 2021-01-16: 1000 mL via INTRAVENOUS

## 2021-01-16 MED ORDER — HYDROCODONE-ACETAMINOPHEN 5-325 MG PO TABS
2.0000 | ORAL_TABLET | Freq: Once | ORAL | Status: AC
  Administered 2021-01-16: 2 via ORAL
  Filled 2021-01-16: qty 2

## 2021-01-16 MED ORDER — HYDROMORPHONE HCL 1 MG/ML IJ SOLN
1.0000 mg | Freq: Once | INTRAMUSCULAR | Status: AC
  Administered 2021-01-16: 1 mg via INTRAVENOUS
  Filled 2021-01-16: qty 1

## 2021-01-16 MED ORDER — TAMSULOSIN HCL 0.4 MG PO CAPS: 0.4000 mg | ORAL_CAPSULE | Freq: Every day | ORAL | 0 refills | Status: DC

## 2021-01-16 MED ORDER — ONDANSETRON HCL 4 MG/2ML IJ SOLN
4.0000 mg | Freq: Once | INTRAMUSCULAR | Status: AC
  Administered 2021-01-16: 4 mg via INTRAVENOUS
  Filled 2021-01-16: qty 2

## 2021-01-16 MED ORDER — OXYCODONE-ACETAMINOPHEN 5-325 MG PO TABS: 1.0000 | ORAL_TABLET | Freq: Four times a day (QID) | ORAL | 0 refills | Status: DC | PRN

## 2021-01-16 MED ORDER — ONDANSETRON 4 MG PO TBDP: ORAL_TABLET | ORAL | 0 refills | Status: DC

## 2021-01-16 MED ORDER — KETOROLAC TROMETHAMINE 30 MG/ML IJ SOLN
30.0000 mg | Freq: Once | INTRAMUSCULAR | Status: AC
  Administered 2021-01-16: 30 mg via INTRAVENOUS
  Filled 2021-01-16: qty 1

## 2021-01-16 NOTE — ED Provider Notes (Signed)
[facility] EMERGENCY DEPARTMENT Provider Note   CSN: [id number] Arrival date & time: 01/15/21  2206     History Chief Complaint  Patient presents with  . Flank Pain    Alicia Miller is a 45 y.o. female.  Patient presents to the emergency department with a chief complaint of flank pain. She was just diagnosed with a kidney stone yesterday morning. She has not had any relief with her medication at home. She states the pain is severe. She reports associated vomiting. She denies fever.  The history is provided by the patient. The history is limited by a language barrier. A language interpreter was used Customer service manager interpreter).       Past Medical History:  Diagnosis Date  . 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 Cancer (HCC)    Cervical  . Depression   . Laryngospasm 12/10/2016  . Left-sided Bell's palsy 01/19/2016  . 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 No pertinent past medical history   . Varicose veins of left leg with edema     Patient Active Problem List   Diagnosis Date Noted  . Vasculitis ([place]) 12/17/2019  . Martin-Gruber anastomosis ([place]) Right 11/16/2019  . Mood changes 07/28/2019  . Dyspareunia in female 07/28/2019  . Menorrhagia with regular cycle 07/28/2019  . S/P cholecystectomy 07/28/2019  . Nevus, non-neoplastic 08/06/2017  . Dermatofibroma 08/06/2017  . Seborrheic keratoses 06/12/2017  . 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 Pruritic erythematous rash 06/12/2017  . Varicose veins of left lower extremity with complications 74/10/8785  . Vitamin D insufficiency 08/16/2016  . Joint laxity of right knee 08/15/2016  . Chronic pain of left ankle 08/15/2016  . Chronic pain of right ankle 08/15/2016  . Fatigue 08/15/2016  . Stress incontinence, female 02/12/2016  . Breast tenderness in  female 02/12/2016  . Allergic rhinitis 08/11/2015  . Snoring 08/11/2015  . Laryngopharyngeal reflux 08/11/2015    Past Surgical History:  Procedure Laterality Date  . CHOLECYSTECTOMY  05/2015  . ENDOVENOUS ABLATION SAPHENOUS VEIN W/ LASER Left 12/08/2017   endovenous laser ablation left greater saphenous vein and stab phlebectomy 10-20 incisions left leg by Tinnie Gens MD      OB History    Gravida  2   Para  2   Term  2   Preterm  0   AB  0   Living  2     SAB  0   IAB  0   Ectopic      Multiple  0   Live Births  2           Family History  Problem Relation Age of Onset  . Diabetes Father   . Cancer Sister        uterine  . Diabetes Sister   . Diabetes Paternal Grandmother   . Diabetes Paternal Uncle   . Diabetes Paternal Uncle   . Diabetes Paternal Uncle   . Diabetes Paternal Uncle   . Diabetes Paternal Uncle   . Diabetes Paternal Uncle     Social History   Tobacco Use  . Smoking status: Never Smoker  . Smokeless tobacco: Never Used  Vaping Use  . Vaping Use: Never used  Substance Use Topics  . Alcohol use: No  . Drug use: No    Home Medications Prior to  Admission medications   Medication Sig Start Date End Date Taking? Authorizing Provider  cephALEXin (KEFLEX) 500 MG capsule Take 1 capsule (500 mg total) by mouth 2 (two) times daily. Patient not taking: Reported on 09/08/2020 07/06/20   Jaynee Eagles, PA-C  methocarbamol (ROBAXIN) 500 MG tablet Take 1 tablet (500 mg total) by mouth 4 (four) times daily. Prn muscle spasm Patient not taking: Reported on 09/08/2020 06/15/20   Argentina Donovan, PA-C  naproxen (NAPROSYN) 500 MG tablet Take 1 tablet (500 mg total) by mouth 2 (two) times daily with a meal. Prn pain Patient not taking: Reported on 09/08/2020 06/15/20   Argentina Donovan, PA-C  ondansetron (ZOFRAN ODT) 4 MG disintegrating tablet 4mg  ODT q4 hours prn nausea/vomit 01/15/21   Milton Ferguson, MD  oxyCODONE-acetaminophen (PERCOCET) 5-325  MG tablet Take 1 tablet by mouth every 6 (six) hours as needed. 01/15/21   Milton Ferguson, MD  rosuvastatin (CRESTOR) 20 MG tablet Take 1 tablet (20 mg total) by mouth daily. 06/15/20   Argentina Donovan, PA-C  tamsulosin (FLOMAX) 0.4 MG CAPS capsule Take 1 capsule (0.4 mg total) by mouth daily. 01/15/21   Milton Ferguson, MD    Allergies    Bactrim [sulfamethoxazole-trimethoprim]  Review of Systems   Review of Systems  All other systems reviewed and are negative.   Physical Exam Updated Vital Signs BP 125/77   Pulse 73   Temp (!) 97.2 F (36.2 C) (Oral)   Resp (!) 29   Ht 5\' 2"  (1.575 m)   Wt 66.7 kg   LMP 01/05/2021 Comment: neg preg test 2.27.2022  SpO2 99%   BMI 26.89 kg/m   Physical Exam Vitals and nursing note reviewed.  Constitutional:      General: She is not in acute distress.    Appearance: She is well-developed and well-nourished.  HENT:     Head: Normocephalic and atraumatic.  Eyes:     Conjunctiva/sclera: Conjunctivae normal.  Cardiovascular:     Rate and Rhythm: Normal rate and regular rhythm.     Heart sounds: No murmur heard.   Pulmonary:     Effort: Pulmonary effort is normal. No respiratory distress.     Breath sounds: Normal breath sounds.  Abdominal:     Palpations: Abdomen is soft.     Tenderness: There is no abdominal tenderness.  Musculoskeletal:        General: No edema. Normal range of motion.     Cervical back: Neck supple.  Skin:    General: Skin is warm and dry.  Neurological:     Mental Status: She is alert and oriented to person, place, and time.  Psychiatric:        Mood and Affect: Mood and affect and mood normal.        Behavior: Behavior normal.     ED Results / Procedures / Treatments   Labs (all labs ordered are listed, but only abnormal results are displayed) Labs Reviewed  URINALYSIS, ROUTINE W REFLEX MICROSCOPIC - Abnormal; Notable for the following components:      Result Value   Color, Urine AMBER (*)     APPearance TURBID (*)    Specific Gravity, Urine 1.032 (*)    Hgb urine dipstick LARGE (*)    Ketones, ur 20 (*)    Protein, ur 30 (*)    RBC / HPF >50 (*)    All other components within normal limits  PREGNANCY, URINE    EKG None  Radiology CT Renal  Stone Study  Result Date: 01/15/2021 CLINICAL DATA:  Hematuria with low back and flank pain. EXAM: CT ABDOMEN AND PELVIS WITHOUT CONTRAST TECHNIQUE: Multidetector CT imaging of the abdomen and pelvis was performed following the standard protocol without IV contrast. COMPARISON:  11/14/2016 FINDINGS: Lower chest: Unremarkable Hepatobiliary: No focal abnormality in the liver on this study without intravenous contrast. Gallbladder is surgically absent. No intrahepatic or extrahepatic biliary dilation. Pancreas: No focal mass lesion. No dilatation of the main duct. No intraparenchymal cyst. No peripancreatic edema. Spleen: No splenomegaly. No focal mass lesion. Adrenals/Urinary Tract: No adrenal nodule or mass. 2 mm nonobstructing stone noted upper pole right kidney with 4 mm nonobstructing stone noted lower pole right kidney. Mild fullness of the right intrarenal collecting system noted with 2 x 2 x 5 mm stone at the right UPJ (26/2). Right ureter unremarkable. No stones are seen in the left kidney or ureter. No bladder stones. Stomach/Bowel: Stomach is unremarkable. No gastric wall thickening. No evidence of outlet obstruction. Duodenum is normally positioned as is the ligament of Treitz. No small bowel wall thickening. No small bowel dilatation. The terminal ileum is normal. The appendix is normal. No gross colonic mass. No colonic wall thickening. A few scattered diverticuli are seen in the left colon without diverticulitis. Vascular/Lymphatic: No abdominal aortic aneurysm. No abdominal aortic atherosclerotic calcification. There is no gastrohepatic or hepatoduodenal ligament lymphadenopathy. No retroperitoneal or mesenteric lymphadenopathy. No pelvic  sidewall lymphadenopathy. Reproductive: Unremarkable. Other: No intraperitoneal free fluid. Musculoskeletal: No worrisome lytic or sclerotic osseous abnormality. IMPRESSION: 1. 2 x 2 x 5 mm right UPJ stone with mild fullness of the right intrarenal collecting system. 2. Additional nonobstructing right renal stones. Electronically Signed   By: Misty Stanley M.D.   On: 01/15/2021 12:44    Procedures Procedures   Medications Ordered in ED Medications  sodium chloride 0.9 % bolus 1,000 mL (has no administration in time range)  HYDROmorphone (DILAUDID) injection 1 mg (1 mg Intravenous Given 01/16/21 0033)  ketorolac (TORADOL) 30 MG/ML injection 30 mg (30 mg Intravenous Given 01/16/21 0033)  ondansetron (ZOFRAN) injection 4 mg (4 mg Intravenous Given 01/16/21 0033)    ED Course  I have reviewed the triage vital signs and the nursing notes.  Pertinent labs & imaging results that were available during my care of the patient were reviewed by me and considered in my medical decision making (see chart for details).    MDM Rules/Calculators/A&P                          Patient with recently diagnosed KS yesterday presenting with right flank pain.  She wasn't able to get her pain medications filled.    Will treat pain and reassess.  Patient feeling better after dilaudid and toradol.  Will send re-route her pain meds to the 24 hour pharmacy.  Recommend urology follow-up. Final Clinical Impression(s) / ED Diagnoses Final diagnoses:  Kidney stone    Rx / DC Orders ED Discharge Orders    None       Montine Circle, PA-C 01/16/21 0507    Ripley Fraise, MD 01/16/21 [phone]

## 2021-01-18 ENCOUNTER — Encounter: Payer: Self-pay | Admitting: Family Medicine

## 2021-01-18 ENCOUNTER — Ambulatory Visit: Payer: Self-pay | Attending: Family Medicine | Admitting: Family Medicine

## 2021-01-18 ENCOUNTER — Other Ambulatory Visit: Payer: Self-pay

## 2021-01-18 VITALS — BP 120/73 | HR 78 | Ht 62.0 in | Wt 147.0 lb

## 2021-01-18 DIAGNOSIS — N200 Calculus of kidney: Secondary | ICD-10-CM

## 2021-01-18 MED ORDER — KETOROLAC TROMETHAMINE 60 MG/2ML IM SOLN
60.0000 mg | Freq: Once | INTRAMUSCULAR | Status: AC
  Administered 2021-01-18: 60 mg via INTRAMUSCULAR

## 2021-01-18 NOTE — Progress Notes (Signed)
Subjective:  Patient ID: Alicia Miller, female    DOB: 1975-11-21  Age: 45 y.o. MRN: [id number]  CC: Hospitalization Follow-up   HPI Kaslyn [name] presents for an ED follow up. Seen for kidney stones on 01/15/21 at the ED  She does not feel good today as pain was at a 6/10 when she woke up but is now at a 10/10. She has nausea and has Zofran for this. Also received Percocet which has been ineffective. CT renal stone study revealed: IMPRESSION: 1. 2 x 2 x 5 mm right UPJ stone with mild fullness of the right intrarenal collecting system. 2. Additional nonobstructing right renal stones. Currently has no Urology appointment.  Past Medical History:  Diagnosis Date  . 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 Cancer (HCC)    Cervical  . Depression   . Laryngospasm 12/10/2016  . Left-sided Bell's palsy 01/19/2016  . 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 No pertinent past medical history   . Varicose veins of left leg with edema     Past Surgical History:  Procedure Laterality Date  . CHOLECYSTECTOMY  05/2015  . ENDOVENOUS ABLATION SAPHENOUS VEIN W/ LASER Left 12/08/2017   endovenous laser ablation left greater saphenous vein and stab phlebectomy 10-20 incisions left leg by Tinnie Gens MD     Family History  Problem Relation Age of Onset  . Diabetes Father   . Cancer Sister        uterine  . Diabetes Sister   . Diabetes Paternal Grandmother   . Diabetes Paternal Uncle   . Diabetes Paternal Uncle   . Diabetes Paternal Uncle   . Diabetes Paternal Uncle   . Diabetes Paternal Uncle   . Diabetes Paternal Uncle     Allergies  Allergen Reactions  . Bactrim [Sulfamethoxazole-Trimethoprim] Itching    Outpatient Medications Prior to Visit  Medication Sig Dispense Refill  . ondansetron (ZOFRAN ODT) 4 MG disintegrating tablet 4mg  ODT  q4 hours prn nausea/vomit 12 tablet 0  . oxyCODONE-acetaminophen (PERCOCET) 5-325 MG tablet Take 1 tablet by mouth every 6 (six) hours as needed. 20 tablet 0  . tamsulosin (FLOMAX) 0.4 MG CAPS capsule Take 1 capsule (0.4 mg total) by mouth daily. 5 capsule 0  . cephALEXin (KEFLEX) 500 MG capsule Take 1 capsule (500 mg total) by mouth 2 (two) times daily. (Patient not taking: No sig reported) 14 capsule 0  . methocarbamol (ROBAXIN) 500 MG tablet Take 1 tablet (500 mg total) by mouth 4 (four) times daily. Prn muscle spasm (Patient not taking: No sig reported) 90 tablet 0  . naproxen (NAPROSYN) 500 MG tablet Take 1 tablet (500 mg total) by mouth 2 (two) times daily with a meal. Prn pain (Patient not taking: No sig reported) 60 tablet 1  . rosuvastatin (CRESTOR) 20 MG tablet Take 1 tablet (20 mg total) by mouth daily. (Patient not taking: Reported on 01/18/2021) 90 tablet 3   No facility-administered medications prior to visit.     ROS Review of Systems  Constitutional: Negative for activity change, appetite change and fatigue.  HENT: Negative for congestion, sinus pressure and sore throat.   Eyes: Negative for visual disturbance.  Respiratory: Negative for cough, chest tightness, shortness of breath and wheezing.   Cardiovascular: Negative for chest pain and palpitations.  Gastrointestinal: Positive for abdominal pain. Negative for  abdominal distention and constipation.  Endocrine: Negative for polydipsia.  Genitourinary: Negative for dysuria and frequency.  Musculoskeletal: Negative for arthralgias and back pain.  Skin: Negative for rash.  Neurological: Negative for tremors, light-headedness and numbness.  Hematological: Does not bruise/bleed easily.  Psychiatric/Behavioral: Negative for agitation and behavioral problems.    Objective:  BP 120/73   Pulse 78   Ht 5\' 2"  (1.575 m)   Wt 147 lb (66.7 kg)   LMP 01/05/2021 Comment: neg preg test 2.27.2022  SpO2 98%   BMI 26.89 kg/m    BP/Weight 01/18/2021 01/16/2021 0/96/2836  Systolic BP 629 476 -  Diastolic BP 73 95 -  Wt. (Lbs) 147 - 147  BMI 26.89 - 26.89      Physical Exam Constitutional:      Appearance: She is well-developed. She is ill-appearing.  Neck:     Vascular: No JVD.  Cardiovascular:     Rate and Rhythm: Normal rate.     Heart sounds: Normal heart sounds. No murmur heard.   Pulmonary:     Effort: Pulmonary effort is normal.     Breath sounds: Normal breath sounds. No wheezing or rales.  Chest:     Chest wall: No tenderness.  Abdominal:     General: Bowel sounds are normal. There is no distension.     Palpations: Abdomen is soft. There is no mass.     Tenderness: There is abdominal tenderness (RLQ).  Musculoskeletal:        General: Normal range of motion.     Right lower leg: No edema.     Left lower leg: No edema.  Neurological:     Mental Status: She is alert and oriented to person, place, and time.  Psychiatric:        Mood and Affect: Mood normal.     CMP Latest Ref Rng & Units 01/15/2021 09/22/2020 09/08/2020  Glucose 70 - 99 mg/dL 95 - 97  BUN 6 - 20 mg/dL 10 - 11  Creatinine 0.44 - 1.00 mg/dL 0.60 - 0.62  Sodium 135 - 145 mmol/L 137 - 137  Potassium 3.5 - 5.1 mmol/L 3.7 - 4.4  Chloride 98 - 111 mmol/L 103 - 102  CO2 22 - 32 mmol/L 26 - 22  Calcium 8.9 - 10.3 mg/dL 9.1 - 9.6  Total Protein 6.5 - 8.1 g/dL 7.6 8.0 7.4  Total Bilirubin 0.3 - 1.2 mg/dL 0.8 0.4 0.4  Alkaline Phos 38 - 126 U/L 40 57 55  AST 15 - 41 U/L 17 15 10   ALT 0 - 44 U/L 16 19 11     Lipid Panel     Component Value Date/Time   CHOL 256 (H) 09/22/2020 1126   TRIG 213 (H) 09/22/2020 1126   HDL 45 09/22/2020 1126   CHOLHDL 5.7 (H) 09/22/2020 1126   LDLCALC 171 (H) 09/22/2020 1126    CBC    Component Value Date/Time   WBC 7.9 01/15/2021 1056   RBC 5.25 (H) 01/15/2021 1056   HGB 14.7 01/15/2021 1056   HGB 15.0 12/09/2019 1210   HCT 44.9 01/15/2021 1056   HCT 43.6 12/09/2019 1210   PLT 383  01/15/2021 1056   PLT 447 12/09/2019 1210   MCV 85.5 01/15/2021 1056   MCV 83 12/09/2019 1210   MCH 28.0 01/15/2021 1056   MCHC 32.7 01/15/2021 1056   RDW 12.7 01/15/2021 1056   RDW 12.3 12/09/2019 1210   LYMPHSABS 2.2 01/15/2021 1056   LYMPHSABS 2.2 10/11/2019 0901  MONOABS 0.5 01/15/2021 1056   EOSABS 0.4 01/15/2021 1056   EOSABS 0.3 10/11/2019 0901   BASOSABS 0.1 01/15/2021 1056   BASOSABS 0.1 10/11/2019 0901    Lab Results  Component Value Date   HGBA1C 5.5 07/31/2018    Assessment & Plan:  1. Nephrolithiasis Continue Percocet and Zofran - Ambulatory referral to Urology - ketorolac (TORADOL) injection 60 mg    No orders of the defined types were placed in this encounter.   Return if symptoms worsen or fail to improve.       Charlott Rakes, MD, FAAFP. [facility] [place], [place]   01/18/2021, 10:40 AM

## 2021-01-18 NOTE — Patient Instructions (Signed)

## 2021-01-23 ENCOUNTER — Ambulatory Visit: Payer: No Typology Code available for payment source

## 2021-01-25 ENCOUNTER — Ambulatory Visit
Admission: RE | Admit: 2021-01-25 | Discharge: 2021-01-25 | Disposition: A | Discharge status: Home / Self Care | Payer: No Typology Code available for payment source | Source: Ambulatory Visit | Attending: Obstetrics and Gynecology | Admitting: Obstetrics and Gynecology

## 2021-01-25 ENCOUNTER — Ambulatory Visit: Payer: Self-pay | Admitting: *Deleted

## 2021-01-25 ENCOUNTER — Other Ambulatory Visit: Payer: Self-pay

## 2021-01-25 ENCOUNTER — Other Ambulatory Visit: Payer: Self-pay | Admitting: Obstetrics and Gynecology

## 2021-01-25 VITALS — BP 130/86 | Wt 147.2 lb

## 2021-01-25 DIAGNOSIS — N6310 Unspecified lump in the right breast, unspecified quadrant: Secondary | ICD-10-CM

## 2021-01-25 DIAGNOSIS — N6489 Other specified disorders of breast: Secondary | ICD-10-CM

## 2021-01-25 DIAGNOSIS — Z01419 Encounter for gynecological examination (general) (routine) without abnormal findings: Secondary | ICD-10-CM

## 2021-01-25 DIAGNOSIS — N6313 Unspecified lump in the right breast, lower outer quadrant: Secondary | ICD-10-CM

## 2021-01-25 DIAGNOSIS — N644 Mastodynia: Secondary | ICD-10-CM

## 2021-01-25 NOTE — Progress Notes (Signed)
[name] is a 45 y.o. [id number] female who presents to [facility] clinic today with complaint of right breast lump that that was worked up 10/26/2015 that resolved and came back a couple of months ago. Patient stated the lump drains a white fluid and is painful to the touch. Patient rates the pain at a 4-5 out of 10..    Pap Smear: Pap smear completed today. Last Pap smear was9/11/2018 at [facility] and was normal with negative HPV. Patients previous Pap smear [facility]/15/2018[facility] was normal with negative HPV. Patient has a history of an abnormal Pap smear 10/06/2015 at [facility] that showed endocervical adenocarcinoma in situ. A LEEPwas completed for follow up 12/01/2015. Patient also has a history of a Pap smear 10/06/2014 that was normal and positive for HPV. A colposcopy was completed to follow up 11/03/2014. The above results and Pap smear result from 9/7/2017are in EPIC.   Physical exam: Breasts Breasts symmetrical. No skin abnormalities bilateral breasts. No nipple retraction bilateral breasts. No nipple discharge bilateral breasts. No lymphadenopathy. No lumps palpated left breast. Palpated a lump at 8 o'clock on the areola next to the nipple that is consistent with previous finding on diagnostic mammogram 10/26/2015. Complaints of diffuse left breast pain on exam. Complaints of right outer and nipple area pain on exam.    MS DIGITAL SCREENING BILATERAL  Result Date: 09/11/2017 CLINICAL DATA:  Screening. EXAM: DIGITAL SCREENING BILATERAL MAMMOGRAM WITH CAD COMPARISON:  Previous exam(s). ACR Breast Density Category c: The breast tissue is heterogeneously dense, which may obscure small masses. FINDINGS: There are no findings suspicious for malignancy. Images were processed with CAD. IMPRESSION: No mammographic evidence of malignancy. A result letter of this screening mammogram will be mailed directly to the patient. RECOMMENDATION: Screening mammogram in one year.  (Code:SM-B-01Y) BI-RADS CATEGORY  1: Negative. Electronically Signed   By: Lajean Manes M.D.   On: 09/11/2017 15:28   MS DIGITAL SCREENING TOMO BILATERAL  Result Date: 07/21/2019 CLINICAL DATA:  Screening. EXAM: DIGITAL SCREENING BILATERAL MAMMOGRAM WITH TOMO AND CAD COMPARISON:  Previous exam(s). ACR Breast Density Category c: The breast tissue is heterogeneously dense, which may obscure small masses. FINDINGS: There are no findings suspicious for malignancy. Images were processed with CAD. IMPRESSION: No mammographic evidence of malignancy. A result letter of this screening mammogram will be mailed directly to the patient. RECOMMENDATION: Screening mammogram in one year. (Code:SM-B-01Y) BI-RADS CATEGORY  1: Negative. Electronically Signed   By: Nolon Nations M.D.   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 Patient is frequently able to express yellow discharge from the small mass in the skin of the right breast. EXAM: DIGITAL DIAGNOSTIC BILATERAL MAMMOGRAM WITH TOMOSYNTHESIS AND CAD; ULTRASOUND LEFT BREAST LIMITED; ULTRASOUND RIGHT BREAST LIMITED TECHNIQUE: Bilateral digital diagnostic mammography and breast tomosynthesis was performed. The images were evaluated with computer-aided detection.; Targeted ultrasound examination of the left breast was performed; Targeted ultrasound examination of the right breast was performed COMPARISON:  Previous exams. ACR Breast Density Category c: The breast tissue is heterogeneously dense, which may obscure small masses. FINDINGS: There is an oval mass corresponding to the palpable abnormality in the periareolar right breast measuring approximately 0.7 cm. There is an asymmetry seen in the slightly inferior  left breast which  spreads out on the additional images felt to be related to normal/dense fibroglandular tissue. Physical examination of the right breast reveals a mass within the skin of the lower outer areola containing a small yellow papule. Targeted ultrasound of the right breast was performed. There is a oval circumscribed hypoechoic mass within the skin of the right areola at the 8 o'clock position measuring 0.7 x 0.5 x 0.8 cm. This is overall similar in appearance when compared to prior ultrasound from 2016 with findings most compatible with a Montgomery gland cyst. Targeted ultrasound of the entire inferior/central left breast was performed with no suspicious masses or abnormality seen, only extremely dense fibroglandular tissue identified. IMPRESSION: 1. 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 2.  No findings of malignancy in either breast. RECOMMENDATION: 1.  Screening mammogram in one year.(Code:SM-B-01Y) 2. If this Montgomery gland cyst on the right areola continues to enlarge or become infected or bothersome to the patient, then recommend surgical referral for excision. I have discussed the findings and recommendations with the patient. If applicable, a reminder letter will be sent to the patient regarding the next appointment. BI-RADS CATEGORY  2: Benign. Electronically Signed   By: Everlean Alstrom M.D.   On: 01/25/2021 10:51    Pelvic/Bimanual Ext Genitalia No lesions, no swelling and no discharge observed on external genitalia.        Vagina Vagina pink and normal texture. No lesions or discharge observed in vagina.        Cervix Cervix is present. Cervix pink and of normal texture. No discharge observed.    Uterus Uterus is present and palpable. Uterus is titled to the right and normal size.        Adnexae Bilateral ovaries present and palpable. No tenderness on palpation.         Rectovaginal No rectal exam completed today since patient had no rectal  complaints. No skin abnormalities observed on exam.     Smoking History: Patient has never smoked.   Patient Navigation: Patient education provided. Access to services provided for patient through [place] program. Spanish interpreter Edwinna Areola from [facility] provided.    Breast and Cervical Cancer Risk Assessment: Patient has a family history of a paternal aunt having breast cancer. Patient has no known genetic mutations or history of radiation treatment to the chest before age 60. Patient has a history of cervical dysplasia. Patient has no history of being immunocompromised or DES exposure in-utero.  Risk Assessment    Risk Scores      01/25/2021 07/20/2019   Last edited by: Demetrius Revel, LPN Rolena Infante H, LPN   5-year risk: 0.6 % 0.5 %   Lifetime risk: 8.4 % 8.5 %          A: [facility] exam with pap smear Complaint of right breast lump and pain.  P: Referred patient to the [facility] for a diagnostic mammogram. Appointment scheduled Thursday, January 25, 2021 at 0910.  Loletta Parish, RN 01/25/2021 8:18 AM

## 2021-01-25 NOTE — Patient Instructions (Addendum)
Explained breast self awareness with Alicia Miller. Pap smear completed today. Let patient know that next Pap smear will be due based on the result of today's Pap smear.  Referred patient to the [place] for a diagnostic mammogram. Appointment scheduled Thursday, January 25, 2021 at 0910. Patient aware of appointment and will be there. Let patient know will follow up with her within the next couple weeks with results of Pap smear by letter or phone. Alicia Miller verbalized understanding.  Legacy Carrender, [name], RN 8:15 AM

## 2021-01-26 ENCOUNTER — Telehealth: Payer: Self-pay

## 2021-01-26 LAB — CYTOLOGY - PAP
Adequacy: ABSENT
Comment: NEGATIVE
Diagnosis: NEGATIVE
High risk HPV: NEGATIVE

## 2021-01-26 NOTE — Telephone Encounter (Signed)
Via Lavon Paganini, Spanish Interpreter, Patient informed negative Pap/HPV results. Patient verbalized understanding.

## 2021-01-29 ENCOUNTER — Other Ambulatory Visit: Payer: Self-pay

## 2021-01-29 ENCOUNTER — Ambulatory Visit: Payer: Self-pay | Attending: Family Medicine

## 2021-02-07 ENCOUNTER — Telehealth: Payer: Self-pay

## 2021-02-07 NOTE — Telephone Encounter (Signed)
Provider on [facility] 4/25. Call Pt with interpreter services no answer and vm was left that Provider is out the office and appt will need to be rescheduled and call [phone]

## 2021-02-23 ENCOUNTER — Telehealth: Payer: Self-pay

## 2021-02-23 NOTE — Telephone Encounter (Signed)
I call there Pt to cancel her Financial appt since she her next PCP appt is in June

## 2021-02-26 ENCOUNTER — Ambulatory Visit: Payer: Self-pay | Attending: Nurse Practitioner

## 2021-02-26 ENCOUNTER — Other Ambulatory Visit: Payer: Self-pay

## 2021-03-12 ENCOUNTER — Ambulatory Visit: Payer: No Typology Code available for payment source | Admitting: Nurse Practitioner

## 2021-03-26 ENCOUNTER — Ambulatory Visit: Payer: No Typology Code available for payment source

## 2021-04-02 ENCOUNTER — Other Ambulatory Visit: Payer: Self-pay

## 2021-04-02 ENCOUNTER — Ambulatory Visit: Payer: Self-pay | Attending: Nurse Practitioner

## 2021-04-23 ENCOUNTER — Other Ambulatory Visit: Payer: Self-pay

## 2021-04-23 ENCOUNTER — Encounter: Payer: Self-pay | Admitting: Nurse Practitioner

## 2021-04-23 ENCOUNTER — Ambulatory Visit: Payer: Self-pay | Attending: Nurse Practitioner | Admitting: Nurse Practitioner

## 2021-04-23 VITALS — BP 117/79 | HR 73 | Resp 16 | Wt 148.0 lb

## 2021-04-23 DIAGNOSIS — Z7689 Persons encountering health services in other specified circumstances: Secondary | ICD-10-CM

## 2021-04-23 DIAGNOSIS — Z87442 Personal history of urinary calculi: Secondary | ICD-10-CM

## 2021-04-23 DIAGNOSIS — E785 Hyperlipidemia, unspecified: Secondary | ICD-10-CM

## 2021-04-23 DIAGNOSIS — Z1159 Encounter for screening for other viral diseases: Secondary | ICD-10-CM

## 2021-04-23 NOTE — Progress Notes (Signed)
Assessment & Plan:  Alicia Miller was seen today for re-establish.  Diagnoses and all orders for this visit:  Encounter to establish care  Dyslipidemia, goal LDL below 100 -     Lipid panel INSTRUCTIONS: Work on a low fat, heart healthy diet and participate in regular aerobic exercise program by working out at least 150 minutes per week; 5 days a week-30 minutes per day. Avoid red meat/beef/steak,  fried foods. junk foods, sodas, sugary drinks, unhealthy snacking, alcohol and smoking.  Drink at least 80 oz of water per day and monitor your carbohydrate intake daily.    History of nephrolithiasis -     Urinalysis, Complete -     Basic metabolic panel  Need for hepatitis C screening test -     HCV Ab w Reflex to Quant PCR    Patient has been counseled on age-appropriate routine health concerns for screening and prevention. These are reviewed and up-to-date. Referrals have been placed accordingly. Immunizations are up-to-date or declined.    Subjective:   Chief Complaint  Patient presents with  . re-establish   HPI Alicia Miller 45 y.o. female presents to office today for follow up and to establish care with new provider in same office. 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  History of nephrolithiasis 3 weeks ago right sided abdominal pain in the same location as her previous diagnosis of kidney stones. She has an appt with Urology this month. Denies dysuria or hematuria. She endorses passing stones after her    She endorses feelings of fatigue and generalized body aches. She believes it is due to high cholesterol. She does state that cinnamon makes her anxious.  She was diagnosed with COVID a month or 2 ago. Still with dyspnea during cold  temperatures.   Blood pressure is well controlled. Denies chest pain, shortness of breath, palpitations, lightheadedness, dizziness, headaches or BLE edema.  BP Readings from Last 3 Encounters:  04/23/21 117/79  01/25/21 130/86  01/18/21 120/73   Dyslipidemia  She is no longer taking rosuvastatin. Lipid panel pending.  The 10-year ASCVD risk score Mikey Bussing DC Brooke Bonito., et al., 2013) is: 1.3%   Values used to calculate the score:     Age: 66 years     Sex: Female     Is Non-Hispanic African American: No     Diabetic: No     Tobacco smoker: No     Systolic Blood Pressure: 765 mmHg     Is BP treated: No     HDL Cholesterol: 45 mg/dL     Total Cholesterol: 256 mg/dL    Review of Systems  Constitutional: Positive for malaise/fatigue. Negative for fever and weight loss.  HENT: Negative.  Negative for nosebleeds.   Eyes: Negative.  Negative for blurred vision, double vision and photophobia.  Respiratory: Negative.  Negative for cough, shortness of breath and wheezing.   Cardiovascular: Negative.  Negative for chest pain, palpitations and leg swelling.  Gastrointestinal: Negative.  Negative for heartburn, nausea and vomiting.  Musculoskeletal: Positive for myalgias.  Neurological: Negative.  Negative for dizziness, focal weakness, seizures and headaches.  Psychiatric/Behavioral: Negative.  Negative for suicidal ideas.    Past Medical History:  Diagnosis Date  . 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 Cancer (HCC)    Cervical  . Depression   . Laryngospasm 12/10/2016  . Left-sided Bell's palsy 01/19/2016  . Martin-Gruber anastomosis [facility]) Right 11/16/2019   Noted on EMG Dec 2020  . No pertinent past medical history   . Varicose veins of left leg with edema     Past Surgical History:  Procedure Laterality Date  . CHOLECYSTECTOMY  05/2015  . ENDOVENOUS ABLATION SAPHENOUS VEIN W/ LASER Left  12/08/2017   endovenous laser ablation left greater saphenous vein and stab phlebectomy 10-20 incisions left leg by Tinnie Gens MD     Family History  Problem Relation Age of Onset  . Diabetes Father   . Cancer Sister        uterine  . Diabetes Paternal Grandmother   . Diabetes Paternal Uncle   . Diabetes Paternal Uncle   . Diabetes Paternal Uncle   . Diabetes Paternal Uncle   . Diabetes Paternal Uncle   . Diabetes Paternal Uncle   . Diabetes Sister     Social History Reviewed with no changes to be made today.   Outpatient Medications Prior to Visit  Medication Sig Dispense Refill  . rosuvastatin (CRESTOR) 20 MG tablet TAKE 1 TABLET (20 MG TOTAL) BY MOUTH DAILY. (Patient not taking: No sig reported) 90 tablet 3  . cephALEXin (KEFLEX) 500 MG capsule Take 1 capsule (500 mg total) by mouth 2 (two) times daily. (Patient not taking: No sig reported) 14 capsule 0  . methocarbamol (ROBAXIN) 500 MG tablet Take 1 tablet (500 mg total) by mouth 4 (four) times daily. Prn muscle spasm (Patient not taking: No sig reported) 90 tablet 0  . naproxen (NAPROSYN) 500 MG tablet Take 1 tablet (500 mg total) by mouth 2 (two) times daily with a meal. Prn pain (Patient not taking: No sig reported) 60 tablet 1  . ondansetron (ZOFRAN ODT) 4 MG disintegrating tablet 4mg  ODT q4 hours prn nausea/vomit (Patient not taking: Reported on 04/23/2021) 12 tablet 0  . oxyCODONE-acetaminophen (PERCOCET) 5-325 MG tablet Take 1 tablet by mouth every 6 (six) hours as needed. (Patient not taking: Reported on 04/23/2021) 20 tablet 0  . tamsulosin (FLOMAX) 0.4 MG CAPS capsule Take 1 capsule (0.4 mg total) by mouth daily. (Patient not taking: Reported on 04/23/2021) 5 capsule 0   No facility-administered medications prior to visit.    Allergies  Allergen Reactions  . Bactrim [Sulfamethoxazole-Trimethoprim] Itching       Objective:    BP 117/79   Pulse 73   Resp 16   Wt 148 lb (67.1 kg)   SpO2 96%   BMI 27.07 kg/m  Wt  Readings from Last 3 Encounters:  04/23/21 148 lb (67.1 kg)  01/25/21 147 lb 3.2 oz (66.8 kg)  01/18/21 147 lb (66.7 kg)    Physical Exam Vitals and nursing note reviewed.  Constitutional:      Appearance: She is well-developed.  HENT:     Head: Normocephalic and atraumatic.  Cardiovascular:     Rate and Rhythm: Normal rate and regular rhythm.     Heart sounds: Normal heart sounds. No murmur heard. No friction rub. No gallop.   Pulmonary:     Effort: Pulmonary effort is normal. No tachypnea or respiratory distress.     Breath sounds: Normal breath sounds. No decreased breath sounds, wheezing, rhonchi or rales.  Chest:     Chest wall: No tenderness.  Abdominal:     General: Bowel sounds  are normal.     Palpations: Abdomen is soft.  Musculoskeletal:        General: Normal range of motion.     Cervical back: Normal range of motion.  Skin:    General: Skin is warm and dry.  Neurological:     Mental Status: She is alert and oriented to person, place, and time.     Coordination: Coordination normal.  Psychiatric:        Behavior: Behavior normal. Behavior is cooperative.        Thought Content: Thought content normal.        Judgment: Judgment normal.          Patient has been counseled extensively about nutrition and exercise as well as the importance of adherence with medications and regular follow-up. The patient was given clear instructions to go to ER or return to medical center if symptoms don't improve, worsen or new problems develop. The patient verbalized understanding.   Follow-up: Return if symptoms worsen or fail to improve.   Gildardo Pounds, FNP-BC [facility] and [facility], [place]   04/23/2021, 4:20 PM

## 2021-04-24 LAB — LIPID PANEL
Chol/HDL Ratio: 5.1 ratio — ABNORMAL HIGH (ref 0.0–4.4)
Cholesterol, Total: 265 mg/dL — ABNORMAL HIGH (ref 100–199)
HDL: 52 mg/dL (ref >39)
LDL Chol Calc (NIH): 165 mg/dL — ABNORMAL HIGH (ref 0–99)
Triglycerides: 257 mg/dL — ABNORMAL HIGH (ref 0–149)
VLDL Cholesterol Cal: 48 mg/dL — ABNORMAL HIGH (ref 5–40)

## 2021-04-24 LAB — BASIC METABOLIC PANEL
BUN/Creatinine Ratio: 13 (ref 9–23)
BUN: 8 mg/dL (ref 6–24)
CO2: 21 mmol/L (ref 20–29)
Calcium: 9.4 mg/dL (ref 8.7–10.2)
Chloride: 101 mmol/L (ref 96–106)
Creatinine, Ser: 0.64 mg/dL (ref 0.57–1.00)
Glucose: 88 mg/dL (ref 65–99)
Potassium: 4.3 mmol/L (ref 3.5–5.2)
Sodium: 139 mmol/L (ref 134–144)
eGFR: 112 mL/min/{1.73_m2} (ref >59)

## 2021-04-24 LAB — URINALYSIS, COMPLETE
Bilirubin, UA: NEGATIVE
Glucose, UA: NEGATIVE
Ketones, UA: NEGATIVE
Leukocytes,UA: NEGATIVE
Nitrite, UA: NEGATIVE
Protein,UA: NEGATIVE
Specific Gravity, UA: 1.028 (ref 1.005–1.030)
Urobilinogen, Ur: 0.2 mg/dL (ref 0.2–1.0)
pH, UA: 5.5 (ref 5.0–7.5)

## 2021-04-24 LAB — MICROSCOPIC EXAMINATION
Bacteria, UA: NONE SEEN
Casts: NONE SEEN /lpf
WBC, UA: NONE SEEN /hpf (ref 0–5)

## 2021-04-24 LAB — HCV AB W REFLEX TO QUANT PCR: HCV Ab: <0.1 s/co ratio (ref 0.0–0.9)

## 2021-04-24 LAB — HCV INTERPRETATION

## 2021-05-10 ENCOUNTER — Other Ambulatory Visit (HOSPITAL_COMMUNITY): Payer: Self-pay

## 2021-05-22 ENCOUNTER — Other Ambulatory Visit: Payer: Self-pay

## 2021-05-22 MED FILL — Rosuvastatin Calcium Tab 20 MG: ORAL | 30 days supply | Qty: 30 | Fill #0 | Status: AC

## 2021-05-24 ENCOUNTER — Other Ambulatory Visit: Payer: Self-pay

## 2021-07-05 ENCOUNTER — Other Ambulatory Visit: Payer: Self-pay

## 2021-08-03 ENCOUNTER — Other Ambulatory Visit: Payer: Self-pay

## 2021-08-07 ENCOUNTER — Ambulatory Visit: Payer: Self-pay

## 2021-08-07 NOTE — Telephone Encounter (Signed)
Pt. Reports started having headaches 1 week ago. Comes and goes. Hurts in the back. No injury. No other symptoms. Pt. Has appointment 08/29/21. States "this is fine."   Answer Assessment - Initial Assessment Questions 1. LOCATION: "Where does it hurt?"      Back of head 2. ONSET: "When did the headache start?" (Minutes, hours or days)      Last week 3. PATTERN: "Does the pain come and go, or has it been constant since it started?"     Comes and goes 4. SEVERITY: "How bad is the pain?" and "What does it keep you from doing?"  (e.g., Scale 1-10; mild, moderate, or severe)   - MILD (1-3): doesn't interfere with normal activities    - MODERATE (4-7): interferes with normal activities or awakens from sleep    - SEVERE (8-10): excruciating pain, unable to do any normal activities        6 5. RECURRENT SYMPTOM: "Have you ever had headaches before?" If Yes, ask: "When was the last time?" and "What happened that time?"      Yes 6. CAUSE: "What do you think is causing the headache?"     Unsure 7. MIGRAINE: "Have you been diagnosed with migraine headaches?" If Yes, ask: "Is this headache similar?"      No 8. HEAD INJURY: "Has there been any recent injury to the head?"      No 9. OTHER SYMPTOMS: "Do you have any other symptoms?" (fever, stiff neck, eye pain, sore throat, cold symptoms)     No 10. PREGNANCY: "Is there any chance you are pregnant?" "When was your last menstrual period?"       No  Protocols used: Headache-A-AH

## 2021-08-29 ENCOUNTER — Ambulatory Visit: Payer: No Typology Code available for payment source | Admitting: Physician Assistant

## 2021-08-30 ENCOUNTER — Other Ambulatory Visit: Payer: Self-pay

## 2021-08-30 ENCOUNTER — Ambulatory Visit: Payer: Self-pay | Attending: Physician Assistant | Admitting: Physician Assistant

## 2021-08-30 ENCOUNTER — Encounter: Payer: Self-pay | Admitting: Physician Assistant

## 2021-08-30 VITALS — Ht 62.0 in | Wt 149.0 lb

## 2021-08-30 DIAGNOSIS — F439 Reaction to severe stress, unspecified: Secondary | ICD-10-CM | POA: Insufficient documentation

## 2021-08-30 DIAGNOSIS — G44209 Tension-type headache, unspecified, not intractable: Secondary | ICD-10-CM

## 2021-08-30 NOTE — Progress Notes (Signed)
Patient reports pain in the back of the head scaled currently at a 0. Patient reports pain in the area this morning. Patient reports pain beginning 2 months ago. Patient has eaten today and patient has not taken medication today. Patient takes cholesterol medication sparingly.

## 2021-08-30 NOTE — Progress Notes (Signed)
Established Patient Office Visit  Subjective:  Patient ID: Alicia Miller, female    DOB: 1976/10/11  Age: 45 y.o. MRN: [id number]  CC:  Chief Complaint  Patient presents with   Headache    BACK OF HEAD    HPI Alicia Miller states that she has been having pain in the back of her head for the past two months,  States that they happen almost every day, mostly if she is experiencing stress.  Denies n/v/photophobia.  States that they last a few hours up to the whole day.  States that she will take ibuprofen on occasion with some relief.  States that the stressors have not been new.  States that her sleep is good.  States that she drinks very little water.  States that she has not had a vision check up.  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  Does notice neck and shoulder tension.   Due to language barrier, an interpreter was present during the history-taking and subsequent discussion (and for part of the physical exam) with this patient.   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   Cancer (HCC)    Cervical   Depression    Laryngospasm 12/10/2016   Left-sided Bell's palsy 01/19/2016   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   Noted on EMG Dec 2020   No pertinent past medical history    Varicose veins of left leg with edema     Past Surgical History:  Procedure Laterality Date   CHOLECYSTECTOMY  05/2015   ENDOVENOUS ABLATION SAPHENOUS VEIN W/ LASER Left 12/08/2017   endovenous laser ablation left greater saphenous vein and stab phlebectomy 10-20 incisions left leg by Josephina Gip MD     Family History  Problem Relation Age of Onset   Diabetes Father    Cancer Sister        uterine   Diabetes Paternal Grandmother    Diabetes Paternal Uncle    Diabetes Paternal Uncle    Diabetes  Paternal Uncle    Diabetes Paternal Uncle    Diabetes Paternal Uncle    Diabetes Paternal Uncle    Diabetes Sister     Social History   Socioeconomic History   Marital status: Married    Spouse name: Not on file   Number of children: 2   Years of education: Not on file   Highest education level: 6th grade  Occupational History   Not on file  Tobacco Use   Smoking status: Never   Smokeless tobacco: Never  Vaping Use   Vaping Use: Never used  Substance and Sexual Activity   Alcohol use: No   Drug use: No   Sexual activity: Yes    Birth control/protection: None, Other-see comments    Comment: withdrawal method  Other Topics Concern   Not on file  Social History Narrative   Not on file   Social Determinants of Health   Financial Resource Strain: Not on file  Food Insecurity: Not on file  Transportation Needs: No Transportation Needs   Lack of Transportation (Medical): No   Lack of Transportation (Non-Medical): No  Physical Activity: Not on file  Stress: Not on file  Social Connections: Not on file  Intimate Partner Violence: Not on file    Outpatient Medications Prior  to Visit  Medication Sig Dispense Refill   rosuvastatin (CRESTOR) 20 MG tablet TAKE 1 TABLET (20 MG TOTAL) BY MOUTH DAILY. (Patient not taking: No sig reported) 90 tablet 3   No facility-administered medications prior to visit.    Allergies  Allergen Reactions   Bactrim [Sulfamethoxazole-Trimethoprim] Itching    ROS Review of Systems  Constitutional:  Negative for chills and fever.  HENT: Negative.    Eyes:  Negative for photophobia.  Respiratory:  Negative for shortness of breath.   Cardiovascular:  Negative for chest pain.  Gastrointestinal:  Negative for diarrhea, nausea and vomiting.  Endocrine: Negative.   Genitourinary: Negative.   Musculoskeletal: Negative.   Skin: Negative.   Allergic/Immunologic: Negative.   Neurological:  Positive for headaches. Negative for dizziness,  seizures, speech difficulty and weakness.  Hematological: Negative.   Psychiatric/Behavioral:  Negative for dysphoric mood, self-injury, sleep disturbance and suicidal ideas.      Objective:    Physical Exam Vitals and nursing note reviewed.  Constitutional:      Appearance: Normal appearance. She is well-developed.  HENT:     Head: Normocephalic and atraumatic.     Right Ear: External ear normal.     Nose: Nose normal.     Mouth/Throat:     Mouth: Mucous membranes are moist.     Pharynx: Oropharynx is clear.  Eyes:     Extraocular Movements: Extraocular movements intact.     Conjunctiva/sclera: Conjunctivae normal.     Pupils: Pupils are equal, round, and reactive to light.  Cardiovascular:     Rate and Rhythm: Normal rate and regular rhythm.     Pulses: Normal pulses.     Heart sounds: Normal heart sounds.  Pulmonary:     Effort: Pulmonary effort is normal.     Breath sounds: Normal breath sounds.  Musculoskeletal:        General: Normal range of motion.     Cervical back: Normal range of motion and neck supple.  Skin:    General: Skin is warm and dry.  Neurological:     General: No focal deficit present.     Mental Status: She is alert and oriented to person, place, and time.     Cranial Nerves: No cranial nerve deficit.  Psychiatric:        Mood and Affect: Mood normal.        Behavior: Behavior normal.        Thought Content: Thought content normal.        Judgment: Judgment normal.    Ht $R'5\' 2"'Ee$  (1.575 m)   Wt 149 lb (67.6 kg)   LMP 08/24/2021   BMI 27.25 kg/m  Wt Readings from Last 3 Encounters:  08/30/21 149 lb (67.6 kg)  04/23/21 148 lb (67.1 kg)  01/25/21 147 lb 3.2 oz (66.8 kg)     Health Maintenance Due  Topic Date Due   INFLUENZA VACCINE  06/18/2021    There are no preventive care reminders to display for this patient.  Lab Results  Component Value Date   TSH 3.050 03/01/2020   Lab Results  Component Value Date   WBC 7.9 01/15/2021    HGB 14.7 01/15/2021   HCT 44.9 01/15/2021   MCV 85.5 01/15/2021   PLT 383 01/15/2021   Lab Results  Component Value Date   NA 139 04/23/2021   K 4.3 04/23/2021   CO2 21 04/23/2021   GLUCOSE 88 04/23/2021   BUN 8 04/23/2021   CREATININE 0.64 04/23/2021  BILITOT 0.8 01/15/2021   ALKPHOS 40 01/15/2021   AST 17 01/15/2021   ALT 16 01/15/2021   PROT 7.6 01/15/2021   ALBUMIN 4.4 01/15/2021   CALCIUM 9.4 04/23/2021   ANIONGAP 8 01/15/2021   EGFR 112 04/23/2021   Lab Results  Component Value Date   CHOL 265 (H) 04/23/2021   Lab Results  Component Value Date   HDL 52 04/23/2021   Lab Results  Component Value Date   LDLCALC 165 (H) 04/23/2021   Lab Results  Component Value Date   TRIG 257 (H) 04/23/2021   Lab Results  Component Value Date   CHOLHDL 5.1 (H) 04/23/2021   Lab Results  Component Value Date   HGBA1C 5.5 07/31/2018      Assessment & Plan:   Problem List Items Addressed This Visit       Other   Tension headache - Primary   Relevant Orders   CBC with Differential/Platelet   TSH   Vitamin D, 25-hydroxy   Stress    No orders of the defined types were placed in this encounter. 1. Tension headache Patient education given on supportive care, ibuprofen over-the-counter as needed, increase hydration, work on reducing stress, patient education given on stress relieving activities.  Red flags given for prompt reevaluation. - CBC with Differential/Platelet - TSH - Vitamin D, 25-hydroxy  2. Stress    I have reviewed the patient's medical history (PMH, PSH, Social History, Family History, Medications, and allergies) , and have been updated if relevant. I spent 30 minutes reviewing chart and  face to face time with patient.    Follow-up: Return if symptoms worsen or fail to improve.    Loraine Grip Mayers, PA-C

## 2021-08-30 NOTE — Patient Instructions (Signed)
I encourage you to drink more water, aim for at least 64 ounces a day.  I encourage you to work on reducing your stress, doing gentle stretching.  We will call you with today's lab results  Kennieth Rad, PA-C Physician Assistant [facility] Medicine [email or web address]   Control del estrs en los adultos Managing Stress, Adult Sentir un cierto nivel de estrs es normal. El estrs ayuda a que el cuerpo y la mente se preparen para enfrentar las exigencias de la vida. Las hormonas del estrs pueden motivarlo a hacer bien su trabajo y a cumplir con sus responsabilidades. Sin embargo, el estrs intenso o prolongado (crnico) puede Print production planner su salud mental y fsica. El estrs crnico aumenta el riesgo de sufrir ansiedad, depresin y otros problemas de [place], como problemas digestivos, dolores musculares, enfermedades cardacas, hipertensin arterial y accidente cerebrovascular. Cules son las causas? Las causas frecuentes de estrs son las siguientes: Las exigencias del trabajo, como las fechas lmite, la sensacin de Fish farm manager en exceso o el trabajar muchas horas. Las Paramedic, como problemas de dinero, desacuerdos con un cnyuge o problemas en la crianza de los nios. Las presiones por [employer] en la vida, como un divorcio, [place], prdida de un ser querido o enfermedad crnica. Usted puede correr un riesgo ms alto de tener problemas relacionados con el estrs si no duerme lo suficiente, no tiene buena salud, no tiene [employer] o tiene un trastorno de salud mental como ansiedad o depresin. Cmo reconocer el estrs El estrs puede hacer que usted: Tenga dificultad para dormir. Se sienta triste, ansioso, irritable o abrumado. Pierda el apetito. Coma en exceso o tenga deseos de comer alimentos poco saludables. Tenga deseos de consumir drogas o alcohol. El estrs tambin puede causar sntomas fsicos, por  ejemplo: Msculos tensos y doloridos, especialmente en los hombros y el cuello. Dolores de [place]. Dificultad para respirar. Frecuencia cardaca ms rpida. Dolor de [place], nuseas o vmitos. Diarrea o estreimiento. Dificultad para concentrarse. Siga estas instrucciones en su casa: Estilo de vida Identifique el origen del estrs y su reaccin a este. Consulte a un terapeuta que lo ayude a Scientist, clinical (histocompatibility and immunogenetics). Cuando se presenten acontecimientos estresantes: Hable de ello con su familia, amigos o compaeros de [place]. Trate de pensar de un modo realista los acontecimientos estresantes y de no ignorarlos ni Horticulturist, commercial. Intente encontrar los aspectos positivos en una situacin estresante y de no enfocarse en los negativos. Reduzca sus responsabilidades en el trabajo y en su casa, si es posible. Pida ayuda a sus amigos o familiares si la necesita. Encuentre formas de lidiar con Dealer, tales como: Meditacin. Respiracin profunda. Yoga o tai chi. Relajacin muscular progresiva. Hacer arte, tocar msica o leer. Hacerse tiempo para Data processing manager divertidas. Pasar tiempo con amigos y familiares. Recibir apoyo de familiares, amigos o recursos espirituales. Comida y bebida Siga una dieta saludable. Esto puede comprender lo siguiente: Consuma alimentos ricos en fibra, como frijoles, cereales integrales, y frutas y verduras frescas. Limite el consumo de alimentos ricos en grasas y azcares procesados, como alimentos fritos o dulces. No saltee comidas ni coma en exceso. Beba suficiente lquido como para Theatre manager la orina de color [place] plido. Consumo de alcohol No beba alcohol si: Su mdico le indica no hacerlo. Est embarazada, puede estar embarazada o est tratando de [place]. El consumo de alcohol es una forma en la que algunas personas tratan de Public house manager el estrs. Esto puede ser peligroso, por lo que si bebe  alcohol: Limite la cantidad que  bebe: De 0 a 1 medida por da para las mujeres. De 0 a 2 medidas por da para los hombres. Est atento a la cantidad de alcohol que hay en las bebidas que toma. En los [place], una medida equivale a una botella de cerveza de 12 oz (355 ml), un vaso de vino de 5 oz (148 ml) o un vaso de una bebida alcohlica de alta graduacin de 1 oz (44 ml). Actividad  Incluya 30 minutos de ejercicio en su cronograma diario. El ejercicio es un buen recurso para reducir Dealer. Lloyd Huger en su da para realizar una actividad que le resulte relajante. Pruebe caminar, andar en bicicleta, leer un libro o escuchar msica. Programe su tiempo de una manera que reduzca el estrs y mantenga un cronograma uniforme. D prioridad a las tareas ms importantes. Instrucciones generales [place] lo suficiente. Trate de irse a dormir y Holiday representative a la misma hora todos [place]. Use los medicamentos de venta libre y los recetados solamente como se lo haya indicado el mdico. No consuma ningn producto que contenga nicotina o tabaco, como cigarrillos, cigarrillos electrnicos y tabaco de Higher education careers adviser. Si necesita ayuda para dejar de fumar, consulte al mdico. No consuma drogas ni fume para sobrellevar el estrs. Concurra a todas las visitas de seguimiento como se lo haya indicado el mdico. Esto es importante. Dnde buscar apoyo Converse con el mdico sobre cmo controlar el estrs o buscar un grupo de apoyo. Busque un terapeuta para que trabaje con usted sobre las tcnicas de control del estrs. Comunquese con un mdico si: Sus sntomas de Geneticist, molecular. No puede controlar el estrs en su casa. Tiene dificultades para dejar de consumir drogas o alcohol. Solicite ayuda de inmediato si: Podra ser un peligro para usted mismo o para los dems. Tiene cualquier pensamiento de muerte o suicidio. Si alguna vez siente que puede lastimarse o Market researcher a Producer, television/film/video, o tiene pensamientos de poner fin a su  vida, busque ayuda de inmediato. Puede dirigirse al servicio de emergencias ms cercano o comunicarse con: Servicio de emergencias de su localidad (911 en [place] Una lnea de asistencia al suicida y Freight forwarder en crisis, como National Suicide Prevention Lifeline ([place]), al [phone]. Est disponible las 24 horas del da. Resumen Sentir un cierto nivel de estrs es normal, pero el estrs intenso o prolongado (crnico) puede Print production planner su salud mental y fsica. El estrs crnico puede aumentar el riesgo de sufrir ansiedad, depresin y otros problemas de [place], como problemas digestivos, dolores musculares, enfermedades cardacas, hipertensin arterial y accidente cerebrovascular. Usted puede correr un riesgo ms alto de tener problemas relacionados con el estrs si no duerme lo suficiente, no tiene buena salud, carece de [employer] o tiene un trastorno de salud mental como ansiedad o depresin. Identifique el origen del estrs y su reaccin a este. Trate de [employer] acontecimientos estresantes con familiares, amigos o compaeros de [place], de Pension scheme manager un mtodo para sobrellevar la situacin o de obtener 76 de recursos espirituales. Si necesita ms ayuda, hable con el mdico para encontrar un grupo de apoyo o un terapeuta de salud mental. Esta informacin no tiene Marine scientist el consejo del mdico. Asegrese de hacerle al mdico cualquier pregunta que tenga. Document Revised: 07/21/2019 Document Reviewed: 07/21/2019 Elsevier Patient Education  [place].

## 2021-08-31 LAB — CBC WITH DIFFERENTIAL/PLATELET
Basophils Absolute: 0.1 10*3/uL (ref 0.0–0.2)
Basos: 1 %
EOS (ABSOLUTE): 0.4 10*3/uL (ref 0.0–0.4)
Eos: 5 %
Hematocrit: 42.6 % (ref 34.0–46.6)
Hemoglobin: 14.4 g/dL (ref 11.1–15.9)
Immature Grans (Abs): 0 10*3/uL (ref 0.0–0.1)
Immature Granulocytes: 0 %
Lymphocytes Absolute: 2.3 10*3/uL (ref 0.7–3.1)
Lymphs: 28 %
MCH: 28.2 pg (ref 26.6–33.0)
MCHC: 33.8 g/dL (ref 31.5–35.7)
MCV: 84 fL (ref 79–97)
Monocytes Absolute: 0.5 10*3/uL (ref 0.1–0.9)
Monocytes: 6 %
Neutrophils Absolute: 5.1 10*3/uL (ref 1.4–7.0)
Neutrophils: 60 %
Platelets: 371 10*3/uL (ref 150–450)
RBC: 5.1 x10E6/uL (ref 3.77–5.28)
RDW: 13.1 % (ref 11.7–15.4)
WBC: 8.4 10*3/uL (ref 3.4–10.8)

## 2021-08-31 LAB — TSH: TSH: 2.04 u[IU]/mL (ref 0.450–4.500)

## 2021-08-31 LAB — VITAMIN D 25 HYDROXY (VIT D DEFICIENCY, FRACTURES): Vit D, 25-Hydroxy: 23.9 ng/mL — ABNORMAL LOW (ref 30.0–100.0)

## 2021-09-17 ENCOUNTER — Other Ambulatory Visit: Payer: Self-pay

## 2021-09-18 ENCOUNTER — Telehealth: Payer: Self-pay | Admitting: *Deleted

## 2021-09-18 NOTE — Telephone Encounter (Signed)
-----   Message from Kennieth Rad, [place] sent at 09/04/2021 11:57 AM EDT ----- Please call patient and let her know that her thyroid function was within normal limits, she does not show signs of anemia.  Her vitamin D levels are low, she should take 2000 units once daily.  She will purchase this over-the-counter.

## 2021-09-18 NOTE — Telephone Encounter (Signed)
Medical Assistant used [employer] to contact patient.  Interpreter Name: Olam Idler Interpreter #: [phone] Patient verified DOB Patient is aware of thyroid being normal and no low iron. Patient will take OTC 2000 units of vitamin d.

## 2021-09-27 ENCOUNTER — Telehealth (INDEPENDENT_AMBULATORY_CARE_PROVIDER_SITE_OTHER): Payer: Self-pay

## 2021-09-27 NOTE — Telephone Encounter (Signed)
Copied from [place] [phone]. Topic: General - Call Back - No Documentation >> Sep 26, 2021 10:13 AM Scherrie Gerlach wrote: Reason for CRM: pt would like to speak with Clifton James

## 2021-10-01 ENCOUNTER — Telehealth: Payer: Self-pay | Admitting: Nurse Practitioner

## 2021-10-01 NOTE — Telephone Encounter (Signed)
I already spoke with the Pt

## 2021-10-05 ENCOUNTER — Telehealth: Payer: Self-pay | Admitting: Nurse Practitioner

## 2021-10-05 NOTE — Telephone Encounter (Signed)
Copied from [place] [phone]. Topic: General - Other >> Oct 05, 2021  2:24 PM Leward Quan A wrote: Reason for CRM: Patient called in need an appointment to renew her [facility] card that have expired. Have a dental appointment this month and need new card or an award letter Ph# [phone]

## 2021-10-08 NOTE — Telephone Encounter (Signed)
I return Pt call, LVM to call me back

## 2021-10-16 ENCOUNTER — Other Ambulatory Visit: Payer: Self-pay

## 2021-10-16 ENCOUNTER — Ambulatory Visit: Payer: Self-pay | Attending: Physician Assistant | Admitting: Physician Assistant

## 2021-10-16 ENCOUNTER — Ambulatory Visit: Payer: Self-pay | Attending: Nurse Practitioner

## 2021-10-16 DIAGNOSIS — J029 Acute pharyngitis, unspecified: Secondary | ICD-10-CM

## 2021-10-16 DIAGNOSIS — R051 Acute cough: Secondary | ICD-10-CM

## 2021-10-16 MED ORDER — BENZONATATE 100 MG PO CAPS
100.0000 mg | ORAL_CAPSULE | Freq: Two times a day (BID) | ORAL | 0 refills | Status: DC | PRN
  Filled 2021-10-16: qty 20, 10d supply, fill #0

## 2021-10-16 MED ORDER — AZITHROMYCIN 250 MG PO TABS
ORAL_TABLET | ORAL | 0 refills | Status: DC
  Filled 2021-10-16: qty 6, 5d supply, fill #0

## 2021-10-16 NOTE — Progress Notes (Signed)
Patient verified DOB Patient reports a productive cough with clear sputum and body aches for the past 5 days. Patient has eaten today and patient has not taken medication. Patient reports pain in the head in the front and temporal. Patient denies N/V/Diarrhea and her youngest child was sick a month ago with a virus and her oldest was Dx with the flu 3 weeks ago. Patient has been symptomatic for 8 days.

## 2021-10-16 NOTE — Progress Notes (Signed)
Established Patient Office Visit  Subjective:  Patient ID: Alicia Miller, female    DOB: Aug 05, 1976  Age: 45 y.o. MRN: [id number]  CC:  Chief Complaint  Patient presents with   URI    Virtual Visit via Telephone Note  I connected with Alicia Miller on 10/16/21 at  3:10 PM EST by telephone and verified that I am speaking with the correct person using two identifiers.  Location: Patient: Home  Provider: [facility]   I discussed the limitations, risks, security and privacy concerns of performing an evaluation and management service by telephone and the availability of in person appointments. I also discussed with the patient that there may be a patient responsible charge related to this service. The patient expressed understanding and agreed to proceed.   History of Present Illness: States that she has been feeling poorly for the past 8 days with compliants of continued productive cough with clear sputum, body aches along with heaache for the past 5 days. Also complains of sore throat.  Cough is worse at night and interrupting sleep. Denies fever and chills. Eating and drinking okay   Has tried taking teas with honey without relief.  Started taking amoxicillin that she had left over.  States that she has been taking 500 mg ,was taking it every 8 hours, but only took 4 pills, last time she took it was 3 days ago.  Due to language barrier, an interpreter was present during the history-taking and subsequent discussion (and for part of the physical exam) with this patient.    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   Noted on EMG Dec 2020   No pertinent past medical history    Varicose veins of left leg with edema     Past Surgical History:  Procedure Laterality Date   CHOLECYSTECTOMY  05/2015   ENDOVENOUS ABLATION SAPHENOUS VEIN W/ LASER Left 12/08/2017   endovenous laser ablation left greater saphenous vein and stab phlebectomy 10-20 incisions left leg by Josephina Gip MD     Family History  Problem Relation Age of Onset   Diabetes Father    Cancer Sister        uterine   Diabetes Paternal Grandmother    Diabetes Paternal Uncle    Diabetes Paternal Uncle    Diabetes Paternal Uncle    Diabetes Paternal Uncle    Diabetes Paternal Uncle    Diabetes Paternal Uncle    Diabetes Sister     Social History   Socioeconomic History   Marital status: Married    Spouse name: Not on file   Number of children: 2   Years of education: Not on file   Highest education level: 6th grade  Occupational History   Not on file  Tobacco Use   Smoking status: Never   Smokeless tobacco: Never  Vaping Use   Vaping Use: Never used  Substance and Sexual Activity   Alcohol use: No   Drug use: No   Sexual activity: Yes    Birth control/protection: None, Other-see comments    Comment: withdrawal method  Other Topics Concern  Not on file  Social History Narrative   Not on file   Social Determinants of Health   Financial Resource Strain: Not on file  Food Insecurity: Not on file  Transportation Needs: No Transportation Needs   Lack of Transportation (Medical): No   Lack of Transportation (Non-Medical): No  Physical Activity: Not on file  Stress: Not on file  Social Connections: Not on file  Intimate Partner Violence: Not on file    Outpatient Medications Prior to Visit  Medication Sig Dispense Refill   rosuvastatin (CRESTOR) 20 MG tablet TAKE 1 TABLET (20 MG TOTAL) BY MOUTH DAILY. (Patient not taking: No sig reported) 90 tablet 3   No facility-administered medications  prior to visit.    Allergies  Allergen Reactions   Bactrim [Sulfamethoxazole-Trimethoprim] Itching    ROS Review of Systems  Constitutional:  Negative for chills and fever.  HENT:  Positive for congestion, rhinorrhea and sore throat. Negative for sinus pressure, sinus pain and trouble swallowing.   Eyes: Negative.   Respiratory:  Positive for cough. Negative for shortness of breath and wheezing.   Cardiovascular:  Negative for chest pain.  Gastrointestinal:  Negative for constipation and nausea.  Endocrine: Negative.   Genitourinary: Negative.   Musculoskeletal:  Positive for myalgias.  Skin: Negative.   Allergic/Immunologic: Negative.   Neurological:  Positive for headaches. Negative for dizziness and weakness.  Hematological: Negative.   Psychiatric/Behavioral: Negative.       Objective:      There were no vitals taken for this visit. Wt Readings from Last 3 Encounters:  08/30/21 149 lb (67.6 kg)  04/23/21 148 lb (67.1 kg)  01/25/21 147 lb 3.2 oz (66.8 kg)     Health Maintenance Due  Topic Date Due   Pneumococcal Vaccine 67-35 Years old (1 - PCV) Never done   INFLUENZA VACCINE  06/18/2021    There are no preventive care reminders to display for this patient.  Lab Results  Component Value Date   TSH 2.040 08/30/2021   Lab Results  Component Value Date   WBC 8.4 08/30/2021   HGB 14.4 08/30/2021   HCT 42.6 08/30/2021   MCV 84 08/30/2021   PLT 371 08/30/2021   Lab Results  Component Value Date   NA 139 04/23/2021   K 4.3 04/23/2021   CO2 21 04/23/2021   GLUCOSE 88 04/23/2021   BUN 8 04/23/2021   CREATININE 0.64 04/23/2021   BILITOT 0.8 01/15/2021   ALKPHOS 40 01/15/2021   AST 17 01/15/2021   ALT 16 01/15/2021   PROT 7.6 01/15/2021   ALBUMIN 4.4 01/15/2021   CALCIUM 9.4 04/23/2021   ANIONGAP 8 01/15/2021   EGFR 112 04/23/2021   Lab Results  Component Value Date   CHOL 265 (H) 04/23/2021   Lab Results  Component Value Date   HDL 52  04/23/2021   Lab Results  Component Value Date   LDLCALC 165 (H) 04/23/2021   Lab Results  Component Value Date   TRIG 257 (H) 04/23/2021   Lab Results  Component Value Date   CHOLHDL 5.1 (H) 04/23/2021   Lab Results  Component Value Date   HGBA1C 5.5 07/31/2018      Assessment & Plan:   Problem List Items Addressed This Visit   None Visit Diagnoses     Pharyngitis, unspecified etiology    -  Primary   Relevant Medications   azithromycin (ZITHROMAX) 250 MG tablet   Acute cough       Relevant Medications  benzonatate (TESSALON) 100 MG capsule       Meds ordered this encounter  Medications   benzonatate (TESSALON) 100 MG capsule    Sig: Take 1 capsule (100 mg total) by mouth 2 (two) times daily as needed for cough.    Dispense:  20 capsule    Refill:  0    Order Specific Question:   Supervising Provider    Answer:   Elsie Stain [[id number]]   azithromycin (ZITHROMAX) 250 MG tablet    Sig: Take 2 tabs by mouth on day 1, then take 1 tab once daily thereafter    Dispense:  6 tablet    Refill:  0    Order Specific Question:   Supervising Provider    Answer:   Asencion Noble E [[id number]]   Assessment and Plan: 1. Pharyngitis, unspecified etiology Trial azithromycin.  Tessalon Perles.  Patient education given for supportive care.  Red flags for prompt reevaluation.   - azithromycin (ZITHROMAX) 250 MG tablet; Take 2 tabs by mouth on day 1, then take 1 tab once daily thereafter  Dispense: 6 tablet; Refill: 0  2. Acute cough  - benzonatate (TESSALON) 100 MG capsule; Take 1 capsule (100 mg total) by mouth 2 (two) times daily as needed for cough.  Dispense: 20 capsule; Refill: 0   Follow Up Instructions:    I discussed the assessment and treatment plan with the patient. The patient was provided an opportunity to ask questions and all were answered. The patient agreed with the plan and demonstrated an understanding of the instructions.   The patient was advised to  call back or seek an in-person evaluation if the symptoms worsen or if the condition fails to improve as anticipated.  I provided 15 minutes of non-face-to-face time during this encounter.   Loraine Grip Mayers, PA-C

## 2021-10-17 ENCOUNTER — Encounter: Payer: Self-pay | Admitting: Physician Assistant

## 2021-10-17 NOTE — Patient Instructions (Signed)
You are going to take azithromycin as directed, and Tessalon Perles twice a day as needed.  Make sure that you are getting plenty of rest, drinking lots of fluids.  I hope that you feel better soon.  Kennieth Rad, PA-C Physician Assistant [facility] [email or web address]   Faringitis Pharyngitis La faringitis es inflamacin de la garganta (faringe). Es Ardelia Mems causa muy comn de dolor de Investment banker, operational. La faringitis puede ser causada por una bacteria, pero por lo general la provoca un virus. La mayora de los casos de faringitis se curan sin tratamiento. Cules son las causas? Esta afeccin puede ser causada por lo siguiente: Infeccin por virus (viral). La faringitis viral se contagia fcilmente de Ardelia Mems persona a otra (es contagiosa) al toser, estornudar y compartir objetos o utensilios personales como tazas, tenedores, cucharas, cepillos de dientes. Infeccin por bacterias (bacteriana). La faringitis bacteriana se puede contagiar al tocarse la nariz o cara luego de entrar en contacto con las bacterias, o a travs de un contacto cercano, como por ejemplo, al besarse. Alergias. Las alergias pueden causar una acumulacin de mucosidad en la garganta (goteo posnasal) que deriva en la inflamacin e irritacin. A su vez, las alergias pueden bloquear las fosas nasales, lo cual hace que se deba respirar por la boca, y esto seca e Youth worker. Qu incrementa el riesgo? Es ms probable que contraiga esta afeccin si: Fidel Levy 5 y 52 aos. Est en lugares muy concurridos, tales como guardera, escuela o vivir en una residencia estudiantil. Vive en un ambiente de clima fro. Tiene debilitado el sistema que combate las enfermedades (inmunitario). Cules son los signos o sntomas? Los sntomas de esta afeccin varan segn la causa. Los sntomas frecuentes de esta afeccin incluyen los siguientes: Dolor de Investment banker, operational. Fatiga. Fiebre no muy  alta. Nariz tapada (congestin nasal) y tos. Dolor de [place]. Otros sntomas pueden incluir lo siguiente: Ganglios en el cuello (ganglios linfticos) que estn hinchados. Erupciones cutneas. Pelcula parecida a las placas en la garganta o amgdalas. Por lo general, esto es un sntoma de faringitis bacteriana. Vmitos. Ojos rojos con picazn (conjuntivitis). Prdida del apetito. Dolores musculares y en las articulaciones. Amgdalas agrandadas. Cmo se diagnostica? Esta afeccin se puede diagnosticar en funcin de los antecedentes mdicos y un examen fsico. El mdico le har preguntas sobre la enfermedad y sus sntomas. Puede que se haga un cultivo de su garganta para buscar bacterias (prueba rpida para estreptococos). Tambin es posible que se realicen otros anlisis de laboratorio, segn la posible causa, aunque esto es poco comn. Cmo se trata? Muchas veces, no se requiere tratamiento para esta afeccin. La faringitis generalmente mejora en 3 o 4 das sin tratamiento. La faringitis bacteriana puede tratarse con antibiticos. Siga estas instrucciones en su casa: Medicamentos Use los medicamentos de venta libre y los recetados solamente como se lo haya indicado el mdico. Si le recetaron un antibitico, tmelo como se lo haya indicado el mdico. No deje de tomar el antibitico, aunque comience a Sports administrator. Use aerosoles para Airline pilot se lo haya indicado el mdico. Los nios pueden contraer faringitis. No le d aspirina al nio por el riesgo de que contraiga el sndrome de [place]. Control del dolor Para ayudar a Best boy, intente lo siguiente: Beba lquidos calientes, como caldos, infusiones o agua caliente. Tambin puede comer o beber lquidos fros o congelados, tales como paletas de hielo congelado. Haga grgaras con una mezcla de agua y sal 3 o 4 veces al da, o  cuando sea necesario. Para preparar agua con sal, disuelva totalmente de  a 1 cucharadita  (de 3 a 6 g) de sal en 1 taza (237 ml) de agua tibia. Chupe caramelos duros o pastillas para la garganta. Ponga un humidificador de vapor fro en la habitacin por la noche para humedecer el aire. Tambin puede abrir el agua caliente de la ducha y sentarse en el bao con la puerta cerrada durante 5 a 10 minutos.  Instrucciones generales  No consuma ningn producto que contenga nicotina o tabaco. Estos productos incluyen cigarrillos, tabaco para Higher education careers adviser y aparatos de vapeo, como los Psychologist, sport and exercise. Si necesita ayuda para dejar de fumar, consulte al mdico. Haga reposo como se lo haya indicado el mdico. Beber suficiente lquido como para Theatre manager la orina de color amarillo plido. Cmo se evita? Para ayudar a evitar infectarse o que se propague la infeccin: Lvese las manos frecuentemente con agua y jabn durante al menos 20 segundos. Use desinfectante para manos si no dispone de [place] y [place]. No se toque los ojos, la nariz o la boca sin haberse [employer], y [employer] manos despus de tocarse estas zonas. No comparta vasos ni utensilios para comer. Evite el contacto cercano con personas que estn enfermas. Comunquese con un mdico si: Tiene bultos grandes y dolorosos en el cuello. Tiene una erupcin cutnea. Tose con mucosidad de color verde, amarillo amarronado o con [place]. Solicite ayuda de inmediato si: El cuello se pone rgido. Comienza a babear o no puede tragar lquidos. No puede beber ni tomar medicamentos sin vomitar. Siente un dolor intenso que no se va, incluso luego de [employer]. Tiene dificultades para respirar, y no a causa de la congestin nasal. Experimenta un nuevo dolor e hinchazn de las articulaciones como las rodillas, tobillos, [place] o codos. Estos sntomas pueden representar un problema grave que constituye Engineer, maintenance (IT). No espere a ver si los sntomas desaparecen. Solicite atencin mdica de inmediato. Comunquese con el servicio  de emergencias de su localidad (911 en los [place]). No conduzca por sus propios medios [employer] hospital. Resumen La faringitis ocurre cuando hay enrojecimiento, dolor e hinchazn (inflamacin) en la garganta (faringe). Si bien la faringitis puede ser causada por una bacteria, la causa ms comn son los virus. La mayora de los casos de faringitis se curan sin tratamiento. La faringitis bacteriana se trata con antibiticos. Esta informacin no tiene Marine scientist el consejo del mdico. Asegrese de hacerle al mdico cualquier pregunta que tenga. Document Revised: 03/01/2021 Document Reviewed: 03/01/2021 Elsevier Patient Education  [place].

## 2022-01-16 ENCOUNTER — Other Ambulatory Visit: Payer: Self-pay | Admitting: Nurse Practitioner

## 2022-01-16 ENCOUNTER — Other Ambulatory Visit: Payer: Self-pay

## 2022-01-16 DIAGNOSIS — E782 Mixed hyperlipidemia: Secondary | ICD-10-CM

## 2022-01-16 MED ORDER — ROSUVASTATIN CALCIUM 20 MG PO TABS
ORAL_TABLET | Freq: Every day | ORAL | 0 refills | Status: DC
  Filled 2022-01-16: qty 30, 30d supply, fill #0

## 2022-01-16 NOTE — Telephone Encounter (Signed)
Requested medication (s) are due for refill today: yes ? ?Requested medication (s) are on the active medication list: yes   ? ?Last refill: 06/15/20 #90  3 refills ? ?Future visit scheduled no ? ?Notes to clinic:OV note of 04/23/21 states "Pt not taking"  Please review. Thank you. ? ?Requested Prescriptions  ?Pending Prescriptions Disp Refills  ? rosuvastatin (CRESTOR) 20 MG tablet 90 tablet 3  ?  Sig: TAKE 1 TABLET (20 MG TOTAL) BY MOUTH DAILY.  ?  ? Cardiovascular:  Antilipid - Statins 2 Failed - 01/16/2022  8:59 AM  ?  ?  Failed - Lipid Panel in normal range within the last 12 months  ?  Cholesterol, Total  ?Date Value Ref Range Status  ?04/23/2021 265 (H) 100 - 199 mg/dL Final  ? ?LDL Chol Calc (NIH)  ?Date Value Ref Range Status  ?04/23/2021 165 (H) 0 - 99 mg/dL Final  ? ?HDL  ?Date Value Ref Range Status  ?04/23/2021 52 >39 mg/dL Final  ? ?Triglycerides  ?Date Value Ref Range Status  ?04/23/2021 257 (H) 0 - 149 mg/dL Final  ? ?  ?  ?  Passed - Cr in normal range and within 360 days  ?  Creat  ?Date Value Ref Range Status  ?11/07/2016 0.70 0.50 - 1.10 mg/dL Final  ? ?Creatinine, Ser  ?Date Value Ref Range Status  ?04/23/2021 0.64 0.57 - 1.00 mg/dL Final  ?  ?  ?  ?  Passed - Patient is not pregnant  ?  ?  Passed - Valid encounter within last 12 months  ?  Recent Outpatient Visits   ? ?      ? 3 months ago Pharyngitis, unspecified etiology  ? [place], [place]  ? 4 months ago Tension headache  ? [place], [place]  ? 8 months ago Encounter to establish care  ? [place], [place] W, NP  ? 12 months ago Nephrolithiasis  ? [place], MD  ? 1 year ago BP check  ? [place], RPH-CPP  ? ?  ?  ? ?  ?  ?  ? ? ? ? ?

## 2022-01-23 ENCOUNTER — Other Ambulatory Visit: Payer: Self-pay

## 2022-01-31 ENCOUNTER — Ambulatory Visit: Payer: Self-pay | Admitting: *Deleted

## 2022-01-31 NOTE — Telephone Encounter (Signed)
?  Chief Complaint: R sided pain- over 1 year ?Symptoms: pain  when lays on R side ?Frequency: comes and goes- only with pressure of laying down- goes away when changes position ?Pertinent Negatives: Patient denies back pain, diarrhea, fever, urination pain, vomiting ?Disposition: '[]'$ ED /'[]'$ Urgent Care (no appt availability in office) / '[x]'$ Appointment(In office/virtual)/ '[]'$  [place] [facility]/ '[]'$ Home Care/ '[]'$ Refused Recommended Disposition /'[]'$ [facility]/ '[]'$  Follow-up with PCP ?Additional Notes: Appointment scheduled for first available appointment- outside of  disposition- patient is aware and will call back with any changes. ?

## 2022-01-31 NOTE — Telephone Encounter (Signed)
Reason for Disposition ? Abdominal pain is a chronic symptom (recurrent or ongoing AND present > 4 weeks) ? ?Answer Assessment - Initial Assessment Questions ?1. LOCATION: "Where does it hurt?"  ?    R rib pain/side ?2. RADIATION: "Does the pain shoot anywhere else?" (e.g., chest, back) ?    No radiation ?3. ONSET: "When did the pain begin?" (e.g., minutes, hours or days ago)  ?    More than 1 year- patient states she has discussed with provider before ?4. SUDDEN: "Gradual or sudden onset?" ?      ?5. PATTERN "Does the pain come and go, or is it constant?" ?   - If constant: "Is it getting better, staying the same, or worsening?"  ?    (Note: Constant means the pain never goes away completely; most serious pain is constant and it progresses)  ?   - If intermittent: "How long does it last?" "Do you have pain now?" ?    (Note: Intermittent means the pain goes away completely between bouts) ?    Comes and goes- pain when pressure is on the R side- laying ?6. SEVERITY: "How bad is the pain?"  (e.g., Scale 1-10; mild, moderate, or severe) ?  - MILD (1-3): doesn't interfere with normal activities, abdomen soft and not tender to touch  ?  - MODERATE (4-7): interferes with normal activities or awakens from sleep, abdomen tender to touch  ?  - SEVERE (8-10): excruciating pain, doubled over, unable to do any normal activities  ?    moderate ?7. RECURRENT SYMPTOM: "Have you ever had this type of stomach pain before?" If Yes, ask: "When was the last time?" and "What happened that time?"  ?    Chronic- 1 year ?8. CAUSE: "What do you think is causing the stomach pain?" ?    Study- [place] was recommended ?9. RELIEVING/AGGRAVATING FACTORS: "What makes it better or worse?" (e.g., movement, antacids, bowel movement) ?    Moving off the R side ?10. OTHER SYMPTOMS: "Do you have any other symptoms?" (e.g., back pain, diarrhea, fever, urination pain, vomiting) ?      no ?11. PREGNANCY: "Is there any chance you are pregnant?" "When was your  last menstrual period?" ?      *No Answer* ? ?Protocols used: Abdominal Pain - Female-A-AH ? ?

## 2022-02-03 ENCOUNTER — Ambulatory Visit (HOSPITAL_COMMUNITY)
Admission: EM | Admit: 2022-02-03 | Discharge: 2022-02-03 | Disposition: A | Discharge status: Home / Self Care | Payer: No Typology Code available for payment source | Attending: Emergency Medicine | Admitting: Emergency Medicine

## 2022-02-03 ENCOUNTER — Encounter (HOSPITAL_COMMUNITY): Payer: Self-pay | Admitting: Emergency Medicine

## 2022-02-03 DIAGNOSIS — Z20822 Contact with and (suspected) exposure to covid-19: Secondary | ICD-10-CM | POA: Insufficient documentation

## 2022-02-03 DIAGNOSIS — R051 Acute cough: Secondary | ICD-10-CM | POA: Insufficient documentation

## 2022-02-03 DIAGNOSIS — R519 Headache, unspecified: Secondary | ICD-10-CM | POA: Insufficient documentation

## 2022-02-03 DIAGNOSIS — J069 Acute upper respiratory infection, unspecified: Secondary | ICD-10-CM

## 2022-02-03 HISTORY — DX: Pure hypercholesterolemia, unspecified: E78.00

## 2022-02-03 LAB — SARS CORONAVIRUS 2 (TAT 6-24 HRS): SARS Coronavirus 2: NEGATIVE

## 2022-02-03 MED ORDER — IBUPROFEN 800 MG PO TABS: 800.0000 mg | ORAL_TABLET | Freq: Three times a day (TID) | ORAL | 0 refills | Status: DC

## 2022-02-03 MED ORDER — PROMETHAZINE-DM 6.25-15 MG/5ML PO SYRP: 5.0000 mL | ORAL_SOLUTION | Freq: Four times a day (QID) | ORAL | 0 refills | Status: DC | PRN

## 2022-02-03 MED ORDER — BENZONATATE 100 MG PO CAPS: 100.0000 mg | ORAL_CAPSULE | Freq: Three times a day (TID) | ORAL | 0 refills | Status: DC | PRN

## 2022-02-03 NOTE — Discharge Instructions (Signed)
Your symptoms today are most likely being caused by a virus and should steadily improve in time it can take up to 7 to 10 days before you truly start to see a turnaround however things will get better ? ?Take Tessalon every 8 hours as needed to help calm your coughing ? ?You may use cough syrup every 6 hours as needed, be mindful this medication may make you drowsy if this occurs you may use at bedtime ? ?You may use ibuprofen every 8 hours to help with your chest soreness as well as your headache, you may use this in addition to [phone] mg of Tylenol every 6 hours ?  ?  ?For cough: honey 1/2 to 1 teaspoon (you can dilute the honey in water or another fluid).  You can also use guaifenesin .You can use a humidifier for chest congestion and cough.  If you don't have a humidifier, you can sit in the bathroom with the hot shower running.    ?  ?For sore throat: try warm salt water gargles, cepacol lozenges, throat spray, warm tea or water with lemon/honey, popsicles or ice, or OTC cold relief medicine for throat discomfort. ?  ?For congestion: take a daily anti-histamine like Zyrtec, Claritin, and a oral decongestant, such as pseudoephedrine.  You can also use Flonase 1-2 sprays in each nostril daily. ?  ?It is important to stay hydrated: drink plenty of fluids (water, gatorade/powerade/pedialyte, juices, or teas) to keep your throat moisturized and help further relieve irritation/discomfort. ? ?

## 2022-02-03 NOTE — ED Triage Notes (Signed)
Pt had cough and congestion since Thursday. Cough is productive. Pt cough was causing chest pains last night.  ?Pt c/o frontal headache. Took motrin yesterday for headache and certrizine for congestion. Then last night took cold medication ?

## 2022-02-03 NOTE — ED Provider Notes (Signed)
?[place] ? ? ? ?CSN: [id number] ?Arrival date & time: 02/03/22  1002 ? ? ?  ? ?History   ?Chief Complaint ?Chief Complaint  ?Patient presents with  ? Cough  ? Headache  ? ? ?HPI ?Alicia Miller is a 46 y.o. female.  ? ?Patient presents with nasal congestion, rhinorrhea, sore throat, nonproductive cough and generalized headaches for 3 days. Chest soreness begin 1 day ago. Tolerating food and liquids. No sick contacts. Has been taking motrin, antihistamine, not effective.  ? ?Spanish interpreter used for entirety of exam ? ?Past Medical History:  ?Diagnosis Date  ? 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 On 09/2015 pap smear   ? 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 Cancer [facility])   ? Cervical  ? Depression   ? High cholesterol   ? Laryngospasm 12/10/2016  ? Left-sided Bell's palsy 01/19/2016  ? 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 Noted on EMG Dec 2020  ? No pertinent past medical history   ? Varicose veins of left leg with edema   ? ? ?Patient Active Problem List  ? Diagnosis Date Noted  ? Tension headache 08/30/2021  ? Stress 08/30/2021  ? Vasculitis ([place]) 12/17/2019  ? Martin-Gruber anastomosis ([employer]) Right 11/16/2019  ? Mood changes 07/28/2019  ? Dyspareunia in female 07/28/2019  ? Menorrhagia with regular cycle 07/28/2019  ? S/P cholecystectomy 07/28/2019  ? Nevus, non-neoplastic 08/06/2017  ? Dermatofibroma 08/06/2017  ? Seborrheic keratoses 06/12/2017  ? 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 Pruritic erythematous rash 06/12/2017  ? Varicose veins of left lower extremity with complications 70/48/8891  ? Vitamin D insufficiency 08/16/2016  ? Joint laxity of right knee 08/15/2016  ? Chronic pain of left ankle 08/15/2016  ? Chronic pain of right ankle 08/15/2016  ? Fatigue 08/15/2016  ? Stress incontinence, female 02/12/2016  ? Breast tenderness in female 02/12/2016  ? Allergic rhinitis 08/11/2015  ? Snoring 08/11/2015  ? Laryngopharyngeal reflux  08/11/2015  ? ? ?Past Surgical History:  ?Procedure Laterality Date  ? CHOLECYSTECTOMY  05/2015  ? ENDOVENOUS ABLATION SAPHENOUS VEIN W/ LASER Left 12/08/2017  ? endovenous laser ablation left greater saphenous vein and stab phlebectomy 10-20 incisions left leg by Tinnie Gens MD   ? ? ?OB History   ? ? Gravida  ?2  ? Para  ?2  ? Term  ?2  ? Preterm  ?0  ? AB  ?0  ? Living  ?2  ?  ? ? SAB  ?0  ? IAB  ?0  ? Ectopic  ?   ? Multiple  ?0  ? Live Births  ?2  ?   ?  ?  ? ? ? ?Home Medications   ? ?Prior to Admission medications   ?Medication Sig Start Date End Date Taking? Authorizing Provider  ?azithromycin (ZITHROMAX) 250 MG tablet Take 2 tabs by mouth on day 1, then take 1 tab once daily thereafter 10/16/21   Mayers, Cari S, PA-C  ?benzonatate (TESSALON) 100 MG capsule Take 1 capsule (100 mg total) by mouth 2 (two) times daily as needed for cough. 10/16/21   Mayers, Cari S, PA-C  ?rosuvastatin (CRESTOR) 20 MG tablet TAKE 1 TABLET (20 MG TOTAL) BY MOUTH DAILY. 01/16/22 01/16/23  Charlott Rakes, MD  ? ? ?Family History ?Family History  ?Problem Relation Age of Onset  ? Diabetes Father   ? Cancer Sister   ?     uterine  ? Diabetes Paternal Grandmother   ?  Diabetes Paternal Uncle   ? Diabetes Paternal Uncle   ? Diabetes Paternal Uncle   ? Diabetes Paternal Uncle   ? Diabetes Paternal Uncle   ? Diabetes Paternal Uncle   ? Diabetes Sister   ? ? ?Social History ?Social History  ? ?Tobacco Use  ? Smoking status: Never  ? Smokeless tobacco: Never  ?Vaping Use  ? Vaping Use: Never used  ?Substance Use Topics  ? Alcohol use: No  ? Drug use: No  ? ? ? ?Allergies   ?Bactrim [sulfamethoxazole-trimethoprim] ? ? ?Review of Systems ?Review of Systems  ?Constitutional: Negative.   ?HENT:  Positive for congestion, rhinorrhea and sore throat. Negative for dental problem, drooling, ear discharge, ear pain, facial swelling, hearing loss, mouth sores, nosebleeds, postnasal drip, sinus pressure, sinus pain, sneezing, tinnitus, trouble swallowing  and voice change.   ?Respiratory:  Positive for cough. Negative for apnea, choking, chest tightness, shortness of breath, wheezing and stridor.   ?Cardiovascular: Negative.   ?Gastrointestinal: Negative.   ?Musculoskeletal:  Positive for myalgias. Negative for arthralgias, back pain, gait problem, joint swelling, neck pain and neck stiffness.  ?Skin: Negative.   ?Neurological:  Positive for headaches. Negative for dizziness, tremors, seizures, syncope, facial asymmetry, speech difficulty, weakness, light-headedness and numbness.  ? ? ?Physical Exam ?Triage Vital Signs ?ED Triage Vitals  ?Enc Vitals Group  ?   BP 02/03/22 1017 130/72  ?   Pulse Rate 02/03/22 1017 92  ?   Resp 02/03/22 1017 17  ?   Temp 02/03/22 1023 98.2 ?F (36.8 ?C)  ?   Temp Source 02/03/22 1023 Oral  ?   SpO2 02/03/22 1017 99 %  ?   Weight --   ?   Height --   ?   Head Circumference --   ?   Peak Flow --   ?   Pain Score 02/03/22 1019 4  ?   Pain Loc --   ?   Pain Edu? --   ?   Excl. in [place]? --   ? ?No data found. ? ?Updated Vital Signs ?BP 130/72 (BP Location: Left Arm)   Pulse 92   Temp 98.2 ?F (36.8 ?C) (Oral)   Resp 17   LMP 01/26/2022   SpO2 99%  ? ?Visual Acuity ?Right Eye Distance:   ?Left Eye Distance:   ?Bilateral Distance:   ? ?Right Eye Near:   ?Left Eye Near:    ?Bilateral Near:    ? ?Physical Exam ?Constitutional:   ?   Appearance: Normal appearance.  ?HENT:  ?   Head: Normocephalic.  ?   Right Ear: Tympanic membrane, ear canal and external ear normal.  ?   Left Ear: Tympanic membrane, ear canal and external ear normal.  ?   Nose: Congestion and rhinorrhea present.  ?   Mouth/Throat:  ?   Mouth: Mucous membranes are moist.  ?   Pharynx: Oropharynx is clear.  ?Eyes:  ?   Extraocular Movements: Extraocular movements intact.  ?Cardiovascular:  ?   Rate and Rhythm: Normal rate and regular rhythm.  ?   Pulses: Normal pulses.  ?   Heart sounds: Normal heart sounds.  ?Pulmonary:  ?   Effort: Pulmonary effort is normal.  ?   Breath  sounds: Normal breath sounds.  ?Musculoskeletal:     ?   General: Normal range of motion.  ?   Cervical back: Normal range of motion and neck supple.  ?Skin: ?   General: Skin is warm  and dry.  ?Neurological:  ?   Mental Status: She is alert and oriented to person, place, and time. Mental status is at baseline.  ?Psychiatric:     ?   Mood and Affect: Mood normal.     ?   Behavior: Behavior normal.  ? ? ? ?UC Treatments / Results  ?Labs ?(all labs ordered are listed, but only abnormal results are displayed) ?Labs Reviewed - No data to display ? ?EKG ? ? ?Radiology ?No results found. ? ?Procedures ?Procedures (including critical care time) ? ?Medications Ordered in UC ?Medications - No data to display ? ?Initial Impression / Assessment and Plan / UC Course  ?I have reviewed the triage vital signs and the nursing notes. ? ?Pertinent labs & imaging results that were available during my care of the patient were reviewed by me and considered in my medical decision making (see chart for details). ? ?Viral URI with cough ? ?Vital signs are stable and patient has no signs of distress, stable for outpatient treatment, etiology of symptoms is most likely viral, therefore we will attempt to manage symptoms, Tessalon and Promethazine DM prescribed for coughing and ibuprofen for management of headache, may continue use of over-the-counter medications for additional supportive care, urgent care follow-up as needed ?Final Clinical Impressions(s) / UC Diagnoses  ? ?Final diagnoses:  ?None  ? ?Discharge Instructions   ?None ?  ? ?ED Prescriptions   ?None ?  ? ?PDMP not reviewed this encounter. ?  ?Hans Eden, NP ?02/06/22 0825 ? ?

## 2022-03-06 ENCOUNTER — Ambulatory Visit
Admission: RE | Admit: 2022-03-06 | Discharge: 2022-03-06 | Disposition: A | Discharge status: Home / Self Care | Payer: Self-pay | Source: Ambulatory Visit | Attending: Physician Assistant | Admitting: Physician Assistant

## 2022-03-06 ENCOUNTER — Other Ambulatory Visit: Payer: Self-pay

## 2022-03-06 ENCOUNTER — Ambulatory Visit: Payer: Self-pay | Attending: Physician Assistant | Admitting: Physician Assistant

## 2022-03-06 ENCOUNTER — Encounter: Payer: Self-pay | Admitting: Physician Assistant

## 2022-03-06 VITALS — BP 116/76 | HR 91 | Resp 16 | Wt 152.4 lb

## 2022-03-06 DIAGNOSIS — R109 Unspecified abdominal pain: Secondary | ICD-10-CM

## 2022-03-06 DIAGNOSIS — Z87442 Personal history of urinary calculi: Secondary | ICD-10-CM

## 2022-03-06 DIAGNOSIS — Z789 Other specified health status: Secondary | ICD-10-CM

## 2022-03-06 DIAGNOSIS — T7840XD Allergy, unspecified, subsequent encounter: Secondary | ICD-10-CM

## 2022-03-06 DIAGNOSIS — J302 Other seasonal allergic rhinitis: Secondary | ICD-10-CM

## 2022-03-06 DIAGNOSIS — Z758 Other problems related to medical facilities and other health care: Secondary | ICD-10-CM

## 2022-03-06 DIAGNOSIS — Z3202 Encounter for pregnancy test, result negative: Secondary | ICD-10-CM

## 2022-03-06 DIAGNOSIS — Z09 Encounter for follow-up examination after completed treatment for conditions other than malignant neoplasm: Secondary | ICD-10-CM

## 2022-03-06 LAB — POCT URINALYSIS DIP (CLINITEK)
Bilirubin, UA: NEGATIVE
Glucose, UA: NEGATIVE mg/dL
Ketones, POC UA: NEGATIVE mg/dL
Leukocytes, UA: NEGATIVE
Nitrite, UA: NEGATIVE
POC PROTEIN,UA: NEGATIVE
Spec Grav, UA: 1.01 (ref 1.010–1.025)
Urobilinogen, UA: 0.2 E.U./dL
pH, UA: 5.5 (ref 5.0–8.0)

## 2022-03-06 LAB — POCT URINE PREGNANCY: Preg Test, Ur: NEGATIVE

## 2022-03-06 MED ORDER — LORATADINE 10 MG PO TABS
10.0000 mg | ORAL_TABLET | Freq: Every day | ORAL | 1 refills | Status: DC
  Filled 2022-03-06: qty 100, 100d supply, fill #0

## 2022-03-06 MED ORDER — FLUTICASONE PROPIONATE 50 MCG/ACT NA SUSP
2.0000 | Freq: Every day | NASAL | 6 refills | Status: DC
Start: 2022-03-06 — End: 2022-07-13
  Filled 2022-03-06 – 2022-03-18 (×2): qty 16, 30d supply, fill #0

## 2022-03-06 MED ORDER — OLOPATADINE HCL 0.1 % OP SOLN
1.0000 [drp] | Freq: Two times a day (BID) | OPHTHALMIC | 12 refills | Status: DC
Start: 2022-03-06 — End: 2022-07-13
  Filled 2022-03-06: qty 5, 20d supply, fill #0

## 2022-03-06 NOTE — Progress Notes (Signed)
Patient ID: Alicia Miller, female   DOB: 22-Apr-1976, 46 y.o.   MRN: [id number] ? ? ?Alicia Miller, is a 46 y.o. female ? ?[id number] ? ?[id number] ? ?DOB - 08-Dec-1975 ? ?Chief Complaint  ?Patient presents with  ? Abdominal Pain  ?  Pt states the pain is coming from her ribs  ? Foot Swelling  ?    ? ?Subjective:  ? ?Alicia Miller is a 46 y.o. female here today for a follow up  ?Urgent care visit of URI 02/03/2022 and treated with Phenergan DM and tessalon perles.  That has since resolved ? ?Now she is having pollen allergies and itchy watery eyes.  Zyrtec makes her tired.  No fever.  No body aches.   ? ?RUQ/R flank pain.  No N/V/D.  No changes in stool.  No melena/hematochezia.  H/o kidney stones.  She has had cholecystectomy.  Pain is intermittent and worse when she lays down at night.  No urinary s/sx.  Pain is moderated and lasts for a few mins when it occurs ? ?Patient has No headache, No chest pain No problems updated. ? ?ALLERGIES: ?Allergies  ?Allergen Reactions  ? Bactrim [Sulfamethoxazole-Trimethoprim] Itching  ? ? ?PAST MEDICAL HISTORY: ?Past Medical History:  ?Diagnosis Date  ? 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 On 09/2015 pap smear   ? 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 Cancer [facility])   ? Cervical  ? Depression   ? High cholesterol   ? Laryngospasm 12/10/2016  ? Left-sided Bell's palsy 01/19/2016  ? 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 Noted on EMG Dec 2020  ? No pertinent past medical history   ? Varicose veins of left leg with edema   ? ? ?MEDICATIONS AT HOME: ?Prior to Admission medications   ?Medication Sig Start Date End Date Taking? Authorizing Provider  ?fluticasone (FLONASE) 50 MCG/ACT nasal spray Place 2 sprays into both nostrils daily. 03/06/22  Yes Argentina Donovan, PA-C  ?loratadine (CLARITIN) 10 MG tablet Take 1 tablet (10 mg total) by mouth daily. 03/06/22  Yes Argentina Donovan, PA-C  ?olopatadine (PATANOL) 0.1 %  ophthalmic solution Place 1 drop into both eyes 2 (two) times daily. 03/06/22  Yes Argentina Donovan, PA-C  ?ibuprofen (ADVIL) 800 MG tablet Take 1 tablet (800 mg total) by mouth 3 (three) times daily. 02/03/22   Hans Eden, NP  ?rosuvastatin (CRESTOR) 20 MG tablet TAKE 1 TABLET (20 MG TOTAL) BY MOUTH DAILY. 01/16/22 01/16/23  Charlott Rakes, MD  ? ? ?ROS: ?Neg cardiac ?Neg GI ?Neg GU ?Neg MS ?Neg psych ?Neg neuro ? ?Objective:  ? ?Vitals:  ? 03/06/22 0932  ?BP: 116/76  ?Pulse: 91  ?Resp: 16  ?SpO2: 95%  ?Weight: 152 lb 6.4 oz (69.1 kg)  ? ?Exam ?General appearance : Awake, alert, not in any distress. Speech Clear. Not toxic looking ?HEENT: Atraumatic and Normocephalic; eyes with pale erythema ?Neck: Supple, no JVD. No cervical lymphadenopathy.  ?Chest: Good air entry bilaterally, CTAB.  No rales/rhonchi/wheezing ?CVS: S1 S2 regular, no murmurs.  ?Abdomen: Bowel sounds present, Non tender and not distended with no gaurding, rigidity or rebound.  No CVA TTP ?Extremities: B/L Lower Ext shows no edema, both legs are warm to touch ?Neurology: Awake alert, and oriented X 3, CN II-XII intact, Non focal ?Skin: No Rash ? ?Data Review ?Lab Results  ?Component Value Date  ? HGBA1C 5.5 07/31/2018  ? HGBA1C 5.4 10/22/2017  ? HGBA1C 5.3 01/19/2016  ? ? ?  Assessment & Plan  ? ?1. Flank pain ?Increase water intake ?- Comprehensive metabolic panel ?- POCT URINALYSIS DIP (CLINITEK) ?- POCT urine pregnancy ?- DG Abd 1 View; Future ? ?2. Language barrier ?[place] interpreters interpreters used and additional time performing visit was required. ? ?3. Allergy, subsequent encounter ?- fluticasone (FLONASE) 50 MCG/ACT nasal spray; Place 2 sprays into both nostrils daily.  Dispense: 16 g; Refill: 6 ?- olopatadine (PATANOL) 0.1 % ophthalmic solution; Place 1 drop into both eyes 2 (two) times daily.  Dispense: 5 mL; Refill: 12 ?- loratadine (CLARITIN) 10 MG tablet; Take 1 tablet (10 mg total) by mouth daily.  Dispense: 100 tablet; Refill:  1 ? ?4. History of nephrolithiasis ?- Comprehensive metabolic panel ?- POCT URINALYSIS DIP (CLINITEK) ?- POCT urine pregnancy ?- DG Abd 1 View; Future ? ?5. Encounter for examination following treatment at hospital ?URI resolved ? ? ? ?Patient have been counseled extensively about nutrition and exercise. Other issues discussed during this visit include: low cholesterol diet, weight control and daily exercise, foot care, annual eye examinations at Ophthalmology, importance of adherence with medications and regular follow-up. We also discussed long term complications of uncontrolled diabetes and hypertension.  ? ?Return in about 6 months (around 09/05/2022) for PCP for chronic conditions. ? ?The patient was given clear instructions to go to ER or return to medical center if symptoms don't improve, worsen or new problems develop. The patient verbalized understanding. The patient was told to call to get lab results if they haven't heard anything in the next week.  ? ? ? ? ?Freeman Caldron, PA-C ?[place] ?[place], [place] ?[phone]   ?03/06/2022, 9:55 AM  ?

## 2022-03-07 LAB — COMPREHENSIVE METABOLIC PANEL
ALT: 18 IU/L (ref 0–32)
AST: 15 IU/L (ref 0–40)
Albumin/Globulin Ratio: 1.8 (ref 1.2–2.2)
Albumin: 4.6 g/dL (ref 3.8–4.8)
Alkaline Phosphatase: 56 IU/L (ref 44–121)
BUN/Creatinine Ratio: 13 (ref 9–23)
BUN: 8 mg/dL (ref 6–24)
Bilirubin Total: 0.4 mg/dL (ref 0.0–1.2)
CO2: 24 mmol/L (ref 20–29)
Calcium: 9.7 mg/dL (ref 8.7–10.2)
Chloride: 102 mmol/L (ref 96–106)
Creatinine, Ser: 0.64 mg/dL (ref 0.57–1.00)
Globulin, Total: 2.6 g/dL (ref 1.5–4.5)
Glucose: 92 mg/dL (ref 70–99)
Potassium: 5 mmol/L (ref 3.5–5.2)
Sodium: 138 mmol/L (ref 134–144)
Total Protein: 7.2 g/dL (ref 6.0–8.5)
eGFR: 112 mL/min/{1.73_m2} (ref >59)

## 2022-03-12 ENCOUNTER — Other Ambulatory Visit: Payer: Self-pay

## 2022-03-13 ENCOUNTER — Telehealth: Payer: Self-pay | Admitting: Nurse Practitioner

## 2022-03-13 NOTE — Telephone Encounter (Signed)
Copied from [place] [id number]. Topic: General - Other >> Mar 13, 2022 12:47 PM Alicia Miller A wrote: Reason for CRM: The patient would like to speak with a member of clinical staff when possible to review their labs from 03/06/22  Please contact further

## 2022-03-14 ENCOUNTER — Telehealth (INDEPENDENT_AMBULATORY_CARE_PROVIDER_SITE_OTHER): Payer: Self-pay

## 2022-03-14 NOTE — Telephone Encounter (Signed)
-----   Message from Argentina Donovan, [place] sent at 03/07/2022  5:16 PM EDT ----- ?Blood sugar, kidneys, electrolytes, and liver are normal.  Follow up as planned.  Thanks, Freeman Caldron, PA-C ?

## 2022-03-14 NOTE — Telephone Encounter (Signed)
Called with [place] interpreter [phone]) no answer. Left voicemail informing of normal labs and normal xray that did not reveal any kidney stones. Advised to call office and schedule appointment should she continue having issues. Nat Christen, CMA  ?

## 2022-03-15 ENCOUNTER — Telehealth: Payer: Self-pay | Admitting: Nurse Practitioner

## 2022-03-15 ENCOUNTER — Telehealth: Payer: Self-pay

## 2022-03-15 NOTE — Telephone Encounter (Signed)
Pt was called and is aware of results, DOB was confirmed.  ?Interpreter [id number] ?

## 2022-03-15 NOTE — Telephone Encounter (Signed)
Called pt and left vm  ? ?Interpreter [phone]#[id number] ?

## 2022-03-15 NOTE — Telephone Encounter (Signed)
Copied from [place] [id number]. Topic: General - Other ?>> Mar 15, 2022  2:44 PM Tessa Lerner A wrote: ?Reason for CRM: The patient has returned a missed call from the practice regarding their lab results  ? ?Please contact further when possible ?

## 2022-03-18 ENCOUNTER — Other Ambulatory Visit: Payer: Self-pay

## 2022-03-22 ENCOUNTER — Encounter: Payer: Self-pay | Admitting: *Deleted

## 2022-03-22 NOTE — Telephone Encounter (Signed)
Interpreter assistance provided by [employer], [phone] ? ?LVM to return call  ?Will mail letter, labs are normal.  ? ?Blood sugar, kidneys, electrolytes, and liver are normal.  Follow up as planned.  Thanks, Freeman Caldron, PA-C ?

## 2022-05-02 ENCOUNTER — Other Ambulatory Visit: Payer: Self-pay | Admitting: Obstetrics and Gynecology

## 2022-05-02 DIAGNOSIS — Z1231 Encounter for screening mammogram for malignant neoplasm of breast: Secondary | ICD-10-CM

## 2022-06-05 ENCOUNTER — Ambulatory Visit: Payer: No Typology Code available for payment source | Admitting: Physician Assistant

## 2022-07-04 ENCOUNTER — Other Ambulatory Visit: Payer: Self-pay

## 2022-07-04 ENCOUNTER — Other Ambulatory Visit: Payer: Self-pay | Admitting: Nurse Practitioner

## 2022-07-04 DIAGNOSIS — Z1231 Encounter for screening mammogram for malignant neoplasm of breast: Secondary | ICD-10-CM

## 2022-07-11 ENCOUNTER — Ambulatory Visit
Admission: RE | Admit: 2022-07-11 | Discharge: 2022-07-11 | Disposition: A | Discharge status: Home / Self Care | Payer: No Typology Code available for payment source | Source: Ambulatory Visit | Attending: Nurse Practitioner | Admitting: Nurse Practitioner

## 2022-07-11 ENCOUNTER — Ambulatory Visit: Payer: Self-pay | Admitting: *Deleted

## 2022-07-11 VITALS — BP 128/86 | Wt 153.3 lb

## 2022-07-11 DIAGNOSIS — Z01419 Encounter for gynecological examination (general) (routine) without abnormal findings: Secondary | ICD-10-CM

## 2022-07-11 DIAGNOSIS — Z1231 Encounter for screening mammogram for malignant neoplasm of breast: Secondary | ICD-10-CM

## 2022-07-11 NOTE — Patient Instructions (Signed)
Explained breast self awareness Alicia Miller.Pap smear completed today. Let patient know if today's Pap smear is normal and HPV negative that her next Pap smear will be due in one year due to her history. Referred patient to the [facility] for a screening mammogram per recommendation. Appointment scheduled Thursday, July 11, 2022 at 1450. Patient aware of appointment and will be there. Let patient know will follow up with her within the next couple weeks with results of Pap smear by phone. Informed patient that the [facility] will follow up with her within the next couple of weeks with results of her mammogram by letter or phone. [name] verbalized understanding.  Hakiem Malizia, [name], RN 2:44 PM

## 2022-07-11 NOTE — Progress Notes (Signed)
Alicia Miller is a 46 y.o. [id number] female who presents to [facility] clinic today with complaint of right breast lump that that was worked up 10/26/2015 that resolved and came back a couple of months ago around January 2022. Patient stated there is no changes with the lump. Patient states the lump drains a white fluid at times. Patient has complained of pain in the past. Patient denied pain today. Patients last mammogram was completed 01/25/2021 that was benign that a screening mammogram was recommended in one year.    Pap Smear: Pap smear completed today. Last Pap smear was 01/25/2021 at [facility] and was normal with negative HPV. Patients previous Pap smears 07/20/2019, 01/30/2017, and 07/25/2016 were normal with negative HPV. Patient has a history of an abnormal Pap smear 10/06/2015 at [facility] that showed endocervical adenocarcinoma in situ and positive HPV. Patient had a colposcopy 11/22/2015 that showed Adenocarcinoma in situ and a LEEP 01/08/2016 that showed Adenocarcinoma with negative margins for follow up. Patient also has a history of a Pap smear 10/06/2014 that was normal and positive for HPV. A colposcopy was completed to follow up 11/03/2014. The above results are available in Epic.Marland Kitchen   Physical exam: Breasts Breasts symmetrical. No skin abnormalities bilateral breasts. No nipple retraction bilateral breasts. No nipple discharge bilateral breasts. No lymphadenopathy. No lumps palpated left breast. Palpated a lump at 8 o'clock on the areola next to the nipple that is consistent with previous finding on diagnostic mammogram 10/26/2015 and previous exam 01/25/2021. Complaints of left outer breast tenderness on exam.      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  Patient is frequently able to express yellow discharge from the small mass in the skin of the right breast. EXAM: DIGITAL DIAGNOSTIC BILATERAL MAMMOGRAM WITH TOMOSYNTHESIS AND CAD; ULTRASOUND LEFT BREAST LIMITED; ULTRASOUND RIGHT BREAST LIMITED TECHNIQUE: Bilateral digital diagnostic mammography and breast tomosynthesis was performed. The images were evaluated with computer-aided detection.; Targeted ultrasound examination of the left breast was performed; Targeted ultrasound examination of the right breast was performed COMPARISON:  Previous exams. ACR Breast Density Category c: The breast tissue is heterogeneously dense, which may obscure small masses. FINDINGS: There is an oval mass corresponding to the palpable abnormality in the periareolar right breast measuring approximately 0.7 cm. There is an asymmetry seen in the slightly inferior left breast which spreads out on the additional images felt to be related to normal/dense fibroglandular tissue. Physical examination of the right breast reveals a mass within the skin of the lower outer areola containing a small yellow papule. Targeted ultrasound of the right breast was performed. There is a oval circumscribed hypoechoic mass within the skin of the right areola at the 8 o'clock position measuring 0.7 x 0.5 x 0.8 cm. This is overall similar in appearance when compared to prior ultrasound from 2016 with findings most compatible with a Montgomery gland cyst. Targeted ultrasound of the entire inferior/central left breast was performed with no suspicious masses or abnormality seen, only extremely dense fibroglandular tissue identified. IMPRESSION: 1. 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 2.  No findings of malignancy in either breast. RECOMMENDATION: 1.  Screening mammogram in one year.(Code:SM-B-01Y) 2. If this Montgomery gland cyst on the right areola continues to  enlarge or become infected or bothersome to the  patient, then recommend surgical referral for excision. I have discussed the findings and recommendations with the patient. If applicable, a reminder letter will be sent to the patient regarding the next appointment. BI-RADS CATEGORY  2: Benign. Electronically Signed   By: Everlean Alstrom M.D.   On: 01/25/2021 10:51   MS DIGITAL SCREENING TOMO BILATERAL  Result Date: 07/21/2019 CLINICAL DATA:  Screening. EXAM: DIGITAL SCREENING BILATERAL MAMMOGRAM WITH TOMO AND CAD COMPARISON:  Previous exam(s). ACR Breast Density Category c: The breast tissue is heterogeneously dense, which may obscure small masses. FINDINGS: There are no findings suspicious for malignancy. Images were processed with CAD. IMPRESSION: No mammographic evidence of malignancy. A result letter of this screening mammogram will be mailed directly to the patient. RECOMMENDATION: Screening mammogram in one year. (Code:SM-B-01Y) BI-RADS CATEGORY  1: Negative. Electronically Signed   By: Nolon Nations M.D.   On: 07/21/2019 13:08   MS DIGITAL SCREENING BILATERAL  Result Date: 09/11/2017 CLINICAL DATA:  Screening. EXAM: DIGITAL SCREENING BILATERAL MAMMOGRAM WITH CAD COMPARISON:  Previous exam(s). ACR Breast Density Category c: The breast tissue is heterogeneously dense, which may obscure small masses. FINDINGS: There are no findings suspicious for malignancy. Images were processed with CAD. IMPRESSION: No mammographic evidence of malignancy. A result letter of this screening mammogram will be mailed directly to the patient. RECOMMENDATION: Screening mammogram in one year. (Code:SM-B-01Y) BI-RADS CATEGORY  1: Negative. Electronically Signed   By: Lajean Manes M.D.   On: 09/11/2017 15:28    Pelvic/Bimanual Ext Genitalia No lesions, no swelling and no discharge observed on external genitalia.        Vagina Vagina pink and normal texture. No lesions or discharge observed in vagina.        Cervix Cervix is present. Cervix pink and of normal  texture. No discharge observed.    Uterus Uterus is present and palpable. Uterus in normal position and normal size.        Adnexae Bilateral ovaries present and palpable. No tenderness on palpation.         Rectovaginal No rectal exam completed today since patient had no rectal complaints. No skin abnormalities observed on exam.     Smoking History: Patient has never smoked.   Patient Navigation: Patient education provided. Access to services provided for patient through [place] program. Spanish interpreter Rudene Anda from [facility] provided.    Breast and Cervical Cancer Risk Assessment: Patient has a family history of a paternal aunt having breast cancer. Patient has no known genetic mutations or history of radiation treatment to the chest before age 5. Patient has a history of cervical dysplasia. Patient has no history of being immunocompromised or DES exposure in-utero.  Risk Assessment     Risk Scores       07/11/2022 01/25/2021   Last edited by: Royston Bake, CMA McGill, Sherie Mamie Nick, LPN   5-year risk: 0.6 % 0.6 %   Lifetime risk: 8.3 % 8.4 %            A: B[facility]CP exam with pap smear Complaint of right breast lump.  P: Referred patient to the [facility] for a screening mammogram per recommendation. Appointment scheduled Thursday, July 11, 2022 at 1450.  Loletta Parish, RN 07/11/2022 2:44 PM

## 2022-07-13 ENCOUNTER — Encounter: Payer: Self-pay | Admitting: Family Medicine

## 2022-07-13 ENCOUNTER — Ambulatory Visit: Payer: No Typology Code available for payment source | Attending: Nurse Practitioner | Admitting: Family Medicine

## 2022-07-13 VITALS — BP 128/76 | HR 78 | Temp 98.3°F | Resp 16 | Wt 151.0 lb

## 2022-07-13 DIAGNOSIS — E782 Mixed hyperlipidemia: Secondary | ICD-10-CM

## 2022-07-13 DIAGNOSIS — R5383 Other fatigue: Secondary | ICD-10-CM

## 2022-07-13 DIAGNOSIS — E559 Vitamin D deficiency, unspecified: Secondary | ICD-10-CM

## 2022-07-13 MED ORDER — ROSUVASTATIN CALCIUM 20 MG PO TABS
ORAL_TABLET | Freq: Every day | ORAL | 1 refills | Status: DC
  Filled 2022-07-17: qty 90, 90d supply, fill #0

## 2022-07-13 MED ORDER — ERGOCALCIFEROL 1.25 MG (50000 UT) PO CAPS
50000.0000 [IU] | ORAL_CAPSULE | ORAL | 0 refills | Status: DC
  Filled 2022-07-17: qty 12, 84d supply, fill #0

## 2022-07-13 NOTE — Patient Instructions (Signed)
Prevencin de la hipertensin Preventing Hypertension La hipertensin, tambin conocida como presin arterial alta, se produce cuando la sangre bombea en las arterias con demasiada fuerza. Las arterias son vasos sanguneos que transportan la sangre desde el corazn al resto del cuerpo. Con frecuencia, la hipertensin no causa sntomas hasta que la presin arterial es muy alta. Es importante que controle regularmente su presin arterial. Los cambios en la dieta y el estilo de vida pueden ayudar a prevenir la hipertensin y a hacerlo sentir mejor al mejorar su calidad de vida. Si ya tiene hipertensin, puede controlarla con cambios en la dieta y el estilo de vida y con medicamentos. Cmo puede afectarme esta enfermedad? Con el transcurso del tiempo, la hipertensin puede daar las arterias y disminuir el flujo de sangre hacia partes importantes del cuerpo que incluyen el cerebro, el corazn y los riones. Si mantiene su presin arterial en un nivel saludable, podr prevenir complicaciones como un infarto de miocardio, insuficiencia cardaca, un accidente cerebrovascular, insuficiencia renal y demencia vascular. Qu puede aumentar el riesgo? Una alimentacin poco saludable y la falta de actividad fsica pueden aumentar las probabilidades de tener presin arterial alta. Algunos otros factores de riesgo son los siguientes: Edad. El riesgo aumenta con la edad. Tener familiares que han tenido presin arterial alta. Tener ciertas afecciones, como problemas de tiroides. Tener sobrepeso u obesidad. Consumir cafena o alcohol en exceso. Consumir mucha grasa, azcar, caloras o sal (sodio) en su dieta. Fumar o consumir drogas ilegales. Tomar ciertos medicamentos, como antidepresivos, descongestivos, pldoras anticonceptivas y antiinflamatorios no esteroideos (AINE), como el ibuprofeno. Qu medidas puedo tomar para prevenir o controlar esta afeccin? Trabaje junto al mdico para desarrollar un plan de  prevencin de la hipertensin que funcione para usted. Es posible que lo deriven para que reciba asesoramiento sobre una dieta saludable y actividad fsica. Siga su plan y asista a todas las visitas de seguimiento. Cambios en la dieta Siga una dieta saludable. Esto puede comprender lo siguiente: Menor ingesta de sal (sodio). Pregntele al mdico cunto sodio puede consumir de forma segura. La recomendacin general es consumir menos de 1 cucharadita (2300 mg) de sodio por da. No agregue sal a las comidas. Opte por alimentos con bajo contenido de sodio cuando realice las compras o coma fuera de casa. Limite la cantidad de grasa en la dieta. Esto se puede lograr con lcteos descremados o de bajo contenido de grasas e ingiriendo menor cantidad de carnes rojas. Coma ms frutas, verduras y cereales integrales. Establezca un objetivo para comer: 1 a 2 tazas de frutas y verduras frescas todos los das. 3 a 4 porciones de cereales integrales todos los das. Evite los alimentos y las bebidas que tengan azcares agregados. Coma pescados que contengan grasas saludables (cidos grasos omega-3), como la caballa o el salmn. Si necesita implementar un plan de comidas saludable, pruebe la dieta DASH. Esta dieta tiene un alto contenido de frutas, verduras y cereales integrales. Incluye poca cantidad de sodio, carnes rojas y azcares agregados. DASH es la sigla en ingls de "Enfoques Alimentarios para Detener la Hipertensin". Cambios en el estilo de vida  Baje de peso si es necesario. Con tan solo bajar entre el 3 % y el 5 % del peso corporal, puede prevenir o controlar la hipertensin. Por ejemplo, si su peso actual es de 200 libras (91 kg), una prdida entre el 3 % y el 5 % de su peso significa perder entre 6 y 10 libras (2.7 a 4.5 kg). Pdale al mdico que le   recomiende una dieta y un plan de ejercicios para bajar de peso de forma segura. Ejerctate lo suficiente. Debe realizar al menos 150 minutos de ejercicios  de intensidad moderada todas las semanas. Puede realizar este tiempo en sesiones cortas de ejercicios, varias veces al da, o puede realizar sesiones ms largas, pero menos veces por semana. Por ejemplo, puede realizar una caminata enrgica o andar en bicicleta durante 10 minutos, 3 veces al da, durante 5 das a la semana. Encuentre maneras de reducir el estrs, como hacer ejercicios, meditar, escuchar msica o tomar una clase de yoga. Si necesita ayuda para reducir el nivel de estrs, consulte al mdico. No consuma ningn producto que contenga nicotina o tabaco. Estos productos incluyen cigarrillos, tabaco para mascar y aparatos de vapeo, como los cigarrillos electrnicos. Las sustancias qumicas presentes en los productos con tabaco y nicotina elevan su presin arterial cada vez que los consume. Si necesita ayuda para dejar de consumir estos productos, consulte al mdico. Aprenda a medir su presin arterial en casa. Asegrese de conocer su objetivo de presin arterial, como se lo haya indicado el mdico. Trate de dormir entre 7 y 9 horas todas las noches. Consumo de alcohol No beba alcohol si: Su mdico le indica no hacerlo. Est embarazada, puede estar embarazada o est tratando de quedar embarazada. Si bebe alcohol: Limite la cantidad que bebe a lo siguiente: De 0 a 1 medida por da para las mujeres. De 0 a 2 medidas por da para los hombres. Sepa cunta cantidad de alcohol hay en las bebidas que toma. En los [place], una medida equivale a una botella de cerveza de 12 oz (355 ml), un vaso de vino de 5 oz (148 ml) o un vaso de una bebida alcohlica de alta graduacin de 1 oz (44 ml). Medicamentos Adems de los cambios en la dieta y el estilo de vida, el mdico podr indicarle medicamentos para ayudarle a bajar su presin arterial. En general: Tal vez deba probar distintos medicamentos hasta encontrar el ms adecuado para usted. Quiz necesite tomar ms de un medicamento. Use los  medicamentos de venta libre y los recetados solamente como se lo haya indicado el mdico. Preguntas para hacerle al mdico Cul es mi presin arterial ideal? Cmo disminuyo mi riesgo de tener presin arterial alta? Cmo debo controlar mi presin arterial en casa? Dnde obtener apoyo Su mdico puede ayudarle a prevenir la hipertensin y mantener su presin arterial en un nivel saludable. Su hospital o comunidad locales tambin pueden proporcionarle servicios y programas de prevencin. La American Heart Association (Asociacin Estadounidense del Corazn) ofrece un red de ayuda en lnea en supportnetwork.heart.org Dnde obtener ms informacin Obtenga ms informacin sobre la hipertensin en: National Heart, Lung, and Blood Institute (Instituto Nacional del Corazn, los Pulmones y la Sangre): www.nhlbi.nih.gov Centers for Disease Control and Prevention (Centros para el Control y la Prevencin de Enfermedades): www.cdc.gov American Academy of Family Physicians (Academia Estadounidense de Mdicos de Familia): familydoctor.org Obtenga ms informacin sobre la dieta DASH en: National Heart, Lung, and Blood Institute (Instituto Nacional del Corazn, los Pulmones y la Sangre): www.nhlbi.nih.gov Comunquese con un mdico si: Piensa que tiene una reaccin alrgica a los medicamentos que ha tomado. Tiene mareos o dolores de cabeza con recurrencia. Tiene hinchazn en los tobillos. Tiene problemas de visin. Solicite ayuda de inmediato si: Tiene un dolor o malestar repentino o intenso en el pecho, la espalda o el abdomen. Le falta el aire. Tienes un dolor de cabeza repentino e intenso. Estos sntomas pueden   indicar una emergencia. Solicite ayuda de inmediato. Llame al 911. No espere a ver si los sntomas desaparecen. No conduzca por sus propios medios hasta el hospital. Resumen La hipertensin con frecuencia no provoca sntomas hasta que la presin arterial es muy alta. Es importante que controle  regularmente su presin arterial. Los cambios en la dieta y el estilo de vida son pasos importantes para prevenir la hipertensin. Si mantiene su presin arterial en un nivel saludable, podr prevenir complicaciones como un infarto de miocardio, insuficiencia cardaca, un accidente cerebrovascular e insuficiencia renal. Trabaje junto al mdico para desarrollar un plan de prevencin de la hipertensin que funcione para usted. Esta informacin no tiene como fin reemplazar el consejo del mdico. Asegrese de hacerle al mdico cualquier pregunta que tenga. Document Revised: 09/19/2021 Document Reviewed: 09/19/2021 Elsevier Patient Education  2023 Elsevier Inc.  

## 2022-07-13 NOTE — Progress Notes (Signed)
Pt presents for blood pressure concerns,  -states that reading at home was 138/90 when appt was made

## 2022-07-13 NOTE — Progress Notes (Signed)
Subjective:  Patient ID: Alicia Miller, female    DOB: 09-10-1976  Age: 46 y.o. MRN: [id number]  CC: Acute Visit   HPI Alicia Miller is a 46 y.o. year old female with a history of Hyperlipidemia who presents for an acute visit  Interval History: She presents today because she had an elevated BP reading at home - 136/94 but in the Clinic BP is 128/76 and she has no known history of HTN. She also had other elevated readings. She also states she had a headache, was dizzy and that was when her BP was elevated.She was also under a lot of stress at the time. At the moment she is asymptomatic.  She has not been adherent with her Statin She Complains of fatigue and feeling sleepy through out the day. She does have a history of vit D deficiency.  CBC and TSH from October of last year were normal, vitamin D level was low in 08/2021.  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   Noted on EMG Dec 2020   No pertinent past medical history    Varicose veins of left leg with edema     Past Surgical History:  Procedure Laterality Date   CHOLECYSTECTOMY  05/2015   ENDOVENOUS ABLATION SAPHENOUS VEIN W/ LASER Left 12/08/2017   endovenous laser ablation left greater saphenous vein and stab phlebectomy 10-20 incisions left leg by Tinnie Gens MD     Family History  Problem Relation Age of Onset   Diabetes Father    Cancer Sister        uterine   Diabetes Sister    Diabetes Paternal Uncle    Diabetes Paternal Uncle    Diabetes Paternal Uncle    Diabetes Paternal Uncle    Diabetes Paternal Uncle    Diabetes Paternal Uncle    Diabetes Paternal Grandmother    Breast cancer Neg Hx      Social History   Socioeconomic History   Marital status: Married    Spouse name: Not on file   Number of children: 2   Years of education: Not on file   Highest education level: 6th grade  Occupational History   Not on file  Tobacco Use   Smoking status: Never   Smokeless tobacco: Never  Vaping Use   Vaping Use: Never used  Substance and Sexual Activity   Alcohol use: No   Drug use: No   Sexual activity: Yes    Birth control/protection: None, Other-see comments    Comment: withdrawal method  Other Topics Concern   Not on file  Social History Narrative   Not on file   Social Determinants of Health   Financial Resource Strain: Not on file  Food Insecurity: Not on file  Transportation Needs: No Transportation Needs (07/11/2022)   PRAPARE - Transportation    Lack of Transportation (Medical): No    Lack of Transportation (Non-Medical): No  Physical Activity: Not on file  Stress: Not on file  Social Connections: Not on file    Allergies  Allergen Reactions   Bactrim [Sulfamethoxazole-Trimethoprim] Itching    Outpatient Medications Prior to Visit  Medication Sig Dispense  Refill   cyanocobalamin (VITAMIN B12) 100 MCG tablet Take 100 mcg by mouth daily.     fluticasone (FLONASE) 50 MCG/ACT nasal spray Place 2 sprays into both nostrils daily. (Patient not taking: Reported on 07/11/2022) 16 g 6   ibuprofen (ADVIL) 800 MG tablet Take 1 tablet (800 mg total) by mouth 3 (three) times daily. (Patient not taking: Reported on 07/11/2022) 21 tablet 0   loratadine (CLARITIN) 10 MG tablet Take 1 tablet (10 mg total) by mouth daily. (Patient not taking: Reported on 07/11/2022) 100 tablet 1   olopatadine (PATANOL) 0.1 % ophthalmic solution Place 1 drop into both eyes 2 (two) times daily. (Patient not taking: Reported on 07/11/2022) 5 mL 12   rosuvastatin (CRESTOR) 20 MG tablet TAKE 1 TABLET (20 MG TOTAL) BY MOUTH DAILY. (Patient not taking: Reported on 07/11/2022) 30 tablet 0   No  facility-administered medications prior to visit.     ROS Review of Systems  Constitutional:  Positive for fatigue. Negative for activity change and appetite change.  HENT:  Negative for sinus pressure and sore throat.   Respiratory:  Negative for chest tightness, shortness of breath and wheezing.   Cardiovascular:  Negative for chest pain and palpitations.  Gastrointestinal:  Negative for abdominal distention, abdominal pain and constipation.  Genitourinary: Negative.   Musculoskeletal: Negative.   Psychiatric/Behavioral:  Negative for behavioral problems and dysphoric mood.     Objective:  BP 128/76 (BP Location: Left Arm, Patient Position: Sitting, Cuff Size: Normal)   Pulse 78   Temp 98.3 F (36.8 C)   Resp 16   Wt 151 lb (68.5 kg)   SpO2 98%   BMI 27.62 kg/m      07/13/2022    9:47 AM 07/11/2022    2:26 PM 03/06/2022    9:32 AM  BP/Weight  Systolic BP 606 301 601  Diastolic BP 76 86 76  Wt. (Lbs) 151 153.3 152.4  BMI 27.62 kg/m2 28.04 kg/m2 27.87 kg/m2      Physical Exam Constitutional:      Appearance: She is well-developed.  Cardiovascular:     Rate and Rhythm: Normal rate.     Heart sounds: Normal heart sounds. No murmur heard. Pulmonary:     Effort: Pulmonary effort is normal.     Breath sounds: Normal breath sounds. No wheezing or rales.  Chest:     Chest wall: No tenderness.  Abdominal:     General: Bowel sounds are normal. There is no distension.     Palpations: Abdomen is soft. There is no mass.     Tenderness: There is no abdominal tenderness.  Musculoskeletal:        General: Normal range of motion.     Right lower leg: No edema.     Left lower leg: No edema.  Neurological:     Mental Status: She is alert and oriented to person, place, and time.  Psychiatric:        Mood and Affect: Mood normal.        Latest Ref Rng & Units 03/06/2022   10:25 AM 04/23/2021    4:26 PM 01/15/2021   10:56 AM  CMP  Glucose 70 - 99 mg/dL 92  88  95   BUN 6  - 24 mg/dL '8  8  10   '$ Creatinine 0.57 - 1.00 mg/dL 0.64  0.64  0.60   Sodium 134 - 144 mmol/L 138  139  137   Potassium 3.5 - 5.2 mmol/L 5.0  4.3  3.7   Chloride 96 - 106 mmol/L 102  101  103   CO2 20 - 29 mmol/L '24  21  26   '$ Calcium 8.7 - 10.2 mg/dL 9.7  9.4  9.1   Total Protein 6.0 - 8.5 g/dL 7.2   7.6   Total Bilirubin 0.0 - 1.2 mg/dL 0.4   0.8   Alkaline Phos 44 - 121 IU/L 56   40   AST 0 - 40 IU/L 15   17   ALT 0 - 32 IU/L 18   16     Lipid Panel     Component Value Date/Time   CHOL 265 (H) 04/23/2021 1626   TRIG 257 (H) 04/23/2021 1626   HDL 52 04/23/2021 1626   CHOLHDL 5.1 (H) 04/23/2021 1626   LDLCALC 165 (H) 04/23/2021 1626    CBC    Component Value Date/Time   WBC 8.4 08/30/2021 1525   WBC 7.9 01/15/2021 1056   RBC 5.10 08/30/2021 1525   RBC 5.25 (H) 01/15/2021 1056   HGB 14.4 08/30/2021 1525   HCT 42.6 08/30/2021 1525   PLT 371 08/30/2021 1525   MCV 84 08/30/2021 1525   MCH 28.2 08/30/2021 1525   MCH 28.0 01/15/2021 1056   MCHC 33.8 08/30/2021 1525   MCHC 32.7 01/15/2021 1056   RDW 13.1 08/30/2021 1525   LYMPHSABS 2.3 08/30/2021 1525   MONOABS 0.5 01/15/2021 1056   EOSABS 0.4 08/30/2021 1525   BASOSABS 0.1 08/30/2021 1525    Lab Results  Component Value Date   HGBA1C 5.5 07/31/2018    Lab Results  Component Value Date   TSH 2.040 08/30/2021    Assessment & Plan:  1. Vitamin D insufficiency Last vitamin D level was 23.9 in 08/2021 We will write replacement for vitamin D - ergocalciferol (DRISDOL) 1.25 MG (50000 UT) capsule; Take 1 capsule (50,000 Units total) by mouth once a week.  Dispense: 12 capsule; Refill: 0  2. Mixed hyperlipidemia Uncontrolled given she has not been adherent with statin Restart statin Low-cholesterol diet - rosuvastatin (CRESTOR) 20 MG tablet; TAKE 1 TABLET (20 MG TOTAL) BY MOUTH DAILY.  Dispense: 90 tablet; Refill: 1  3. Other fatigue Likely due to vitamin D deficiency as CBC and TSH normal.    Meds ordered  this encounter  Medications   rosuvastatin (CRESTOR) 20 MG tablet    Sig: TAKE 1 TABLET (20 MG TOTAL) BY MOUTH DAILY.    Dispense:  90 tablet    Refill:  1   ergocalciferol (DRISDOL) 1.25 MG (50000 UT) capsule    Sig: Take 1 capsule (50,000 Units total) by mouth once a week.    Dispense:  12 capsule    Refill:  0    Follow-up: Return in about 3 months (around 10/13/2022) for Medical conditions with PCP.       Charlott Rakes, MD, FAAFP. [facility] [place], [place]   07/13/2022, 11:58 AM

## 2022-07-16 ENCOUNTER — Other Ambulatory Visit: Payer: Self-pay | Admitting: Obstetrics and Gynecology

## 2022-07-16 DIAGNOSIS — R928 Other abnormal and inconclusive findings on diagnostic imaging of breast: Secondary | ICD-10-CM

## 2022-07-17 ENCOUNTER — Other Ambulatory Visit: Payer: Self-pay

## 2022-07-18 ENCOUNTER — Other Ambulatory Visit: Payer: Self-pay

## 2022-07-24 ENCOUNTER — Telehealth: Payer: Self-pay

## 2022-07-24 LAB — CYTOLOGY - PAP
Comment: NEGATIVE
Diagnosis: NEGATIVE
High risk HPV: NEGATIVE

## 2022-07-24 NOTE — Telephone Encounter (Signed)
Called patient via Alicia Miller, [employer] to give pap smear results. Informed patient that pap smear was normal and HPV was negative. Based on this result her next pap smear will be due in 1 year due to previous hx. Patient voiced understanding.

## 2022-07-29 ENCOUNTER — Other Ambulatory Visit: Payer: Self-pay | Admitting: Obstetrics and Gynecology

## 2022-07-29 ENCOUNTER — Ambulatory Visit
Admission: RE | Admit: 2022-07-29 | Discharge: 2022-07-29 | Disposition: A | Discharge status: Home / Self Care | Payer: No Typology Code available for payment source | Source: Ambulatory Visit | Attending: Obstetrics and Gynecology | Admitting: Obstetrics and Gynecology

## 2022-07-29 DIAGNOSIS — N6310 Unspecified lump in the right breast, unspecified quadrant: Secondary | ICD-10-CM

## 2022-07-29 DIAGNOSIS — R928 Other abnormal and inconclusive findings on diagnostic imaging of breast: Secondary | ICD-10-CM

## 2022-08-07 ENCOUNTER — Telehealth: Payer: Self-pay | Admitting: *Deleted

## 2022-08-07 ENCOUNTER — Ambulatory Visit: Payer: No Typology Code available for payment source

## 2022-08-07 ENCOUNTER — Other Ambulatory Visit: Payer: No Typology Code available for payment source

## 2022-08-07 NOTE — Telephone Encounter (Signed)
Patient left a voicemail this am in [place]. I had Interpreter Eda Royal interpreter for me. Patient stated she had an appointment she missed this am because it conflicted with dropping her child off at school. Per chart review was for [facility] visit and labs. Will forward to that department. Staci Acosta

## 2022-08-14 ENCOUNTER — Other Ambulatory Visit: Payer: Self-pay

## 2022-08-14 ENCOUNTER — Inpatient Hospital Stay: Payer: Self-pay | Attending: Obstetrics and Gynecology | Admitting: *Deleted

## 2022-08-14 ENCOUNTER — Ambulatory Visit: Payer: No Typology Code available for payment source

## 2022-08-14 VITALS — BP 120/86 | Ht 62.0 in | Wt 154.0 lb

## 2022-08-14 DIAGNOSIS — Z0000 Encounter for general adult medical examination without abnormal findings: Secondary | ICD-10-CM

## 2022-08-14 NOTE — Progress Notes (Signed)
Wisewoman initial [place], [employer]   Clinical Measurement: There were no vitals filed for this visit. Fasting Labs Drawn Today, will review with patient when they result.   Medical History:  Patient states that she has high cholesterol, does not have high blood pressure and she does not have diabetes.  Medications:  Patient states that she does take medication to lower cholesterol, blood pressure or blood sugar.  Patient does not take an aspirin a day to help prevent a heart attack or stroke. During the past 7 days patient has taken prescribed medication to lower cholesterol on 7 days.   Blood pressure, self measurement:  :  Patient states that she does measure blood pressure from home. She checks her blood pressure weekly. She shares her readings with a health care provider: yes.   Nutrition: Patient states that on average she eats 1 cups of fruit and 0 cups of vegetables per day. Patient states that she does not eat fish at least 2 times per week. Patient eats less than half servings of whole grains. Patient drinks less than 36 ounces of beverages with added sugar weekly: yes. Patient is currently watching sodium or salt intake: no. In the past 7 days patient has consumed drinks containing alcohol on 0 days. On a day that patient consumes drinks containing alcohol on average 0 drinks are consumed.      Physical activity:  Patient states that she gets 0 minutes of moderate and 0 minutes of vigorous physical activity each week.  Smoking status:  Patient states that she has has never smoked .   Quality of life:  Over the past 2 weeks patient states that she had little interest or pleasure in doing things: not at all. She has been feeling down, depressed or hopeless:not at all.    Risk reduction and counseling:   Health Coaching: Spoke with patient about the daily recommendations for fruits and vegetables (2 cups fruit and 3 cups vegetables). Showed patient what a  serving size looks like. Patient consumes fish 1 time per week. Gave examples of heart healthy fish that patient cant try adding into diet (salmon, tuna, mackerel, sardines, sea bass or trout). Patient consumes oatmeal but no other whole grains. Gave suggestions for others that she can try adding into diet (whole wheat bread, whole grain cereals, brown rice and whole wheat pasta). Patient has not been exercising recently. See goal below.  Goal: Patient would like to work on exercising more. Patient will work with taking short walks daily for 15 minutes over the next month. Once patient is comfortable she will increase her time and intensity over time. Encouraged patient to speak with PCP if she has any more issues with chest pain while exercising. Also encouraged patient to eat a snack before and after her workouts as well as staying hydrated so she doesn't feel faint during her workouts.  Goal:     Navigation:  I will notify patient of lab results.  Patient is aware of 2 more health coaching sessions and a follow up.  Time: 30 minutes

## 2022-08-15 LAB — LIPID PANEL
Chol/HDL Ratio: 3.2 ratio (ref 0.0–4.4)
Cholesterol, Total: 139 mg/dL (ref 100–199)
HDL: 44 mg/dL (ref >39)
LDL Chol Calc (NIH): 74 mg/dL (ref 0–99)
Triglycerides: 118 mg/dL (ref 0–149)
VLDL Cholesterol Cal: 21 mg/dL (ref 5–40)

## 2022-08-15 LAB — HEMOGLOBIN A1C
Est. average glucose Bld gHb Est-mCnc: 123 mg/dL
Hgb A1c MFr Bld: 5.9 % — ABNORMAL HIGH (ref 4.8–5.6)

## 2022-08-15 LAB — GLUCOSE, RANDOM: Glucose: 94 mg/dL (ref 70–99)

## 2022-08-19 ENCOUNTER — Telehealth: Payer: Self-pay

## 2022-08-19 NOTE — Telephone Encounter (Signed)
Health coaching 2   interpreter- Rudene Anda, [place]- 139 cholesterol, 74 LDL cholesterol, 118 triglycerides, 44 HDL cholesterol, 5.9 hemoglobin A1C, 94 mean plasma glucose.  Patient understands and is aware of her lab results.   Goals- 1. Cut back on the amounts of sweets and sugars consumed in both food and drink.  2. Cut back on the amount of carbs consumed (watch portion sizes). 3. Try and add in daily exercise as her body allows.    Navigation:  Patient is aware of 1 more health coaching sessions and a follow up. Patient has FU appointment already scheduled with [facility] on 10/15/22.  Time-  10 minutes

## 2022-09-06 ENCOUNTER — Ambulatory Visit: Payer: Self-pay | Admitting: Nurse Practitioner

## 2022-10-15 ENCOUNTER — Ambulatory Visit: Payer: Self-pay | Attending: Nurse Practitioner | Admitting: Nurse Practitioner

## 2022-10-15 ENCOUNTER — Encounter: Payer: Self-pay | Admitting: Nurse Practitioner

## 2022-10-15 ENCOUNTER — Other Ambulatory Visit: Payer: Self-pay

## 2022-10-15 VITALS — BP 105/71 | HR 75 | Temp 97.9°F | Ht 62.0 in | Wt 152.6 lb

## 2022-10-15 DIAGNOSIS — R251 Tremor, unspecified: Secondary | ICD-10-CM

## 2022-10-15 DIAGNOSIS — Z23 Encounter for immunization: Secondary | ICD-10-CM

## 2022-10-15 DIAGNOSIS — E7800 Pure hypercholesterolemia, unspecified: Secondary | ICD-10-CM

## 2022-10-15 DIAGNOSIS — Z7689 Persons encountering health services in other specified circumstances: Secondary | ICD-10-CM

## 2022-10-15 DIAGNOSIS — Z1211 Encounter for screening for malignant neoplasm of colon: Secondary | ICD-10-CM

## 2022-10-15 DIAGNOSIS — E559 Vitamin D deficiency, unspecified: Secondary | ICD-10-CM

## 2022-10-15 MED ORDER — ROSUVASTATIN CALCIUM 20 MG PO TABS
ORAL_TABLET | Freq: Every day | ORAL | 1 refills | Status: DC
  Filled 2022-10-15: qty 30, 30d supply, fill #0

## 2022-10-15 NOTE — Progress Notes (Signed)
Assessment & Plan:  Alicia Miller was seen today for vitamin d insufficiency.  Diagnoses and all orders for this visit:  Encounter to establish care  Hypercholesteremia -     rosuvastatin (CRESTOR) 20 MG tablet; TAKE 1 TABLET (20 MG TOTAL) BY MOUTH DAILY. INSTRUCTIONS: Work on a low fat, heart healthy diet and participate in regular aerobic exercise program by working out at least 150 minutes per week; 5 days a week-30 minutes per day. Avoid red meat/beef/steak,  fried foods. junk foods, sodas, sugary drinks, unhealthy snacking, alcohol and smoking.  Drink at least 80 oz of water per day and monitor your carbohydrate intake daily.    Tremor of both hands -     Thyroid Panel With TSH -     Ceruloplasmin  Vitamin D deficiency disease -     VITAMIN D 25 Hydroxy (Vit-D Deficiency, Fractures)  Colon cancer screening -     Fecal occult blood, imunochemical([facility])    Patient has been counseled on age-appropriate routine health concerns for screening and prevention. These are reviewed and up-to-date. Referrals have been placed accordingly. Immunizations are up-to-date or declined.    Subjective:   Chief Complaint  Patient presents with   vitamin D insufficiency   HPI Alicia Miller 46 y.o. female presents to office today to establish care with me today. She has a history of elevated blood pressure without a diagnosis of HTN, prediabetes and hypercholesteremia.    She has a history of hand tremors in the past and reports symptoms went away and have now returned. Tremors are more noticeable  when she tries to paint, draw or write. She states symptoms have been off and on for over 20 years. She has never been evaluated for this.    She is currently taking rosuvastatin 20 mg daily as prescribed.  Lab Results  Component Value Date   CHOL 139 08/14/2022   CHOL 265 (H) 04/23/2021   CHOL 256 (H) 09/22/2020   Lab Results  Component Value Date   HDL 44 08/14/2022   HDL 52  04/23/2021   HDL 45 09/22/2020   Lab Results  Component Value Date   LDLCALC 74 08/14/2022   LDLCALC 165 (H) 04/23/2021   LDLCALC 171 (H) 09/22/2020   Lab Results  Component Value Date   TRIG 118 08/14/2022   TRIG 257 (H) 04/23/2021   TRIG 213 (H) 09/22/2020   Lab Results  Component Value Date   CHOLHDL 3.2 08/14/2022   CHOLHDL 5.1 (H) 04/23/2021   CHOLHDL 5.7 (H) 09/22/2020   No results found for: "LDLDIRECT"   Review of Systems  Constitutional:  Negative for fever, malaise/fatigue and weight loss.  HENT: Negative.  Negative for nosebleeds.   Eyes: Negative.  Negative for blurred vision, double vision and photophobia.  Respiratory: Negative.  Negative for cough and shortness of breath.   Cardiovascular: Negative.  Negative for chest pain, palpitations and leg swelling.  Gastrointestinal: Negative.  Negative for heartburn, nausea and vomiting.  Musculoskeletal: Negative.  Negative for myalgias.  Neurological:  Positive for tremors. Negative for dizziness, focal weakness, seizures and headaches.  Psychiatric/Behavioral: Negative.  Negative for suicidal ideas.     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   Noted on EMG Dec 2020   No pertinent past medical history    Varicose veins of left leg with edema     Past Surgical History:  Procedure Laterality Date   CHOLECYSTECTOMY  05/2015   ENDOVENOUS ABLATION SAPHENOUS VEIN W/ LASER Left 12/08/2017   endovenous laser ablation left greater saphenous vein and stab phlebectomy 10-20 incisions left leg by Tinnie Gens MD     Family History  Problem Relation Age of Onset   Diabetes Father    Uterine cancer Sister     Diabetes Sister    Breast cancer Paternal Aunt        @ unknown age   Diabetes Paternal Uncle    Diabetes Paternal Uncle    Diabetes Paternal Uncle    Diabetes Paternal Uncle    Diabetes Paternal Uncle    Diabetes Paternal Uncle    Diabetes Paternal Grandmother     Social History Reviewed with no changes to be made today.   Outpatient Medications Prior to Visit  Medication Sig Dispense Refill   rosuvastatin (CRESTOR) 20 MG tablet TAKE 1 TABLET (20 MG TOTAL) BY MOUTH DAILY. 90 tablet 1   cyanocobalamin (VITAMIN B12) 100 MCG tablet Take 100 mcg by mouth daily. (Patient not taking: Reported on 08/14/2022)     ergocalciferol (DRISDOL) 1.25 MG (50000 UT) capsule Take 1 capsule (50,000 Units total) by mouth once a week. (Patient not taking: Reported on 10/15/2022) 12 capsule 0   No facility-administered medications prior to visit.    Allergies  Allergen Reactions   Bactrim [Sulfamethoxazole-Trimethoprim] Itching       Objective:    BP 105/71   Pulse 75   Temp 97.9 F (36.6 C) (Temporal)   Ht '5\' 2"'$  (1.575 m)   Wt 152 lb 9.6 oz (69.2 kg)   LMP 10/14/2022 Comment: Start of cycle  SpO2 100%   BMI 27.91 kg/m  Wt Readings from Last 3 Encounters:  10/15/22 152 lb 9.6 oz (69.2 kg)  08/14/22 154 lb (69.9 kg)  07/13/22 151 lb (68.5 kg)    Physical Exam Vitals and nursing note reviewed.  Constitutional:      Appearance: She is well-developed.  HENT:     Head: Normocephalic and atraumatic.  Cardiovascular:     Rate and Rhythm: Normal rate and regular rhythm.     Heart sounds: Normal heart sounds. No murmur heard.    No friction rub. No gallop.  Pulmonary:     Effort: Pulmonary effort is normal. No tachypnea or respiratory distress.     Breath sounds: Normal breath sounds. No decreased breath sounds, wheezing, rhonchi or rales.  Chest:     Chest wall: No tenderness.  Abdominal:     General: Bowel sounds are normal.     Palpations: Abdomen is soft.  Musculoskeletal:         General: Normal range of motion.     Cervical back: Normal range of motion.  Skin:    General: Skin is warm and dry.  Neurological:     Mental Status: She is alert and oriented to person, place, and time.     Motor: Tremor (mild bilateral tremors of both hands only) present.     Coordination: Coordination normal.  Psychiatric:        Behavior: Behavior normal. Behavior is cooperative.        Thought Content: Thought content normal.  Judgment: Judgment normal.          Patient has been counseled extensively about nutrition and exercise as well as the importance of adherence with medications and regular follow-up. The patient was given clear instructions to go to ER or return to medical center if symptoms don't improve, worsen or new problems develop. The patient verbalized understanding.   Follow-up: Return in about 4 months (around 02/13/2023) for prediabetes.   Gildardo Pounds, FNP-BC [facility] [place], [place]   10/15/2022, 10:55 AM

## 2022-10-16 LAB — THYROID PANEL WITH TSH
Free Thyroxine Index: 2.1 (ref 1.2–4.9)
T3 Uptake Ratio: 28 % (ref 24–39)
T4, Total: 7.5 ug/dL (ref 4.5–12.0)
TSH: 2.7 u[IU]/mL (ref 0.450–4.500)

## 2022-10-16 LAB — CERULOPLASMIN: Ceruloplasmin: 19.9 mg/dL (ref 19.0–39.0)

## 2022-10-16 LAB — VITAMIN D 25 HYDROXY (VIT D DEFICIENCY, FRACTURES): Vit D, 25-Hydroxy: 36.8 ng/mL (ref 30.0–100.0)

## 2022-11-25 ENCOUNTER — Telehealth: Payer: Self-pay | Admitting: Nurse Practitioner

## 2022-11-25 NOTE — Telephone Encounter (Signed)
Patient will need to have all paperwork turned in before apt can be scheduled. Will f/u.  Message sent to [facility] to f/u with patient.

## 2022-11-25 NOTE — Telephone Encounter (Signed)
Pt is calling to schedule an appt with the financial counselor to renew her orange Card. Pt is in need of a interpretor. Please advise CB- [phone]

## 2022-12-13 ENCOUNTER — Ambulatory Visit: Payer: Self-pay | Attending: Nurse Practitioner

## 2022-12-14 ENCOUNTER — Other Ambulatory Visit: Payer: Self-pay

## 2022-12-14 ENCOUNTER — Ambulatory Visit (HOSPITAL_COMMUNITY)
Admission: EM | Admit: 2022-12-14 | Discharge: 2022-12-14 | Disposition: A | Discharge status: Home / Self Care | Payer: Self-pay | Attending: Internal Medicine | Admitting: Internal Medicine

## 2022-12-14 ENCOUNTER — Encounter (HOSPITAL_COMMUNITY): Payer: Self-pay | Admitting: *Deleted

## 2022-12-14 ENCOUNTER — Ambulatory Visit (INDEPENDENT_AMBULATORY_CARE_PROVIDER_SITE_OTHER): Payer: Self-pay

## 2022-12-14 DIAGNOSIS — F439 Reaction to severe stress, unspecified: Secondary | ICD-10-CM | POA: Insufficient documentation

## 2022-12-14 DIAGNOSIS — R42 Dizziness and giddiness: Secondary | ICD-10-CM | POA: Insufficient documentation

## 2022-12-14 DIAGNOSIS — R079 Chest pain, unspecified: Secondary | ICD-10-CM | POA: Insufficient documentation

## 2022-12-14 DIAGNOSIS — M549 Dorsalgia, unspecified: Secondary | ICD-10-CM | POA: Insufficient documentation

## 2022-12-14 DIAGNOSIS — R030 Elevated blood-pressure reading, without diagnosis of hypertension: Secondary | ICD-10-CM | POA: Insufficient documentation

## 2022-12-14 LAB — COMPREHENSIVE METABOLIC PANEL
ALT: 20 U/L (ref 0–44)
AST: 22 U/L (ref 15–41)
Albumin: 4 g/dL (ref 3.5–5.0)
Alkaline Phosphatase: 51 U/L (ref 38–126)
Anion gap: 9 (ref 5–15)
BUN: 13 mg/dL (ref 6–20)
CO2: 26 mmol/L (ref 22–32)
Calcium: 9.4 mg/dL (ref 8.9–10.3)
Chloride: 101 mmol/L (ref 98–111)
Creatinine, Ser: 0.74 mg/dL (ref 0.44–1.00)
GFR, Estimated: >60 mL/min (ref >60)
Glucose, Bld: 80 mg/dL (ref 70–99)
Potassium: 3.9 mmol/L (ref 3.5–5.1)
Sodium: 136 mmol/L (ref 135–145)
Total Bilirubin: 0.7 mg/dL (ref 0.3–1.2)
Total Protein: 7.1 g/dL (ref 6.5–8.1)

## 2022-12-14 LAB — CBC
HCT: 44.2 % (ref 36.0–46.0)
Hemoglobin: 14.5 g/dL (ref 12.0–15.0)
MCH: 27.6 pg (ref 26.0–34.0)
MCHC: 32.8 g/dL (ref 30.0–36.0)
MCV: 84 fL (ref 80.0–100.0)
Platelets: 411 10*3/uL — ABNORMAL HIGH (ref 150–400)
RBC: 5.26 MIL/uL — ABNORMAL HIGH (ref 3.87–5.11)
RDW: 12.7 % (ref 11.5–15.5)
WBC: 6.8 10*3/uL (ref 4.0–10.5)
nRBC: 0 % (ref 0.0–0.2)

## 2022-12-14 LAB — POCT URINALYSIS DIPSTICK, ED / UC
Bilirubin Urine: NEGATIVE
Glucose, UA: NEGATIVE mg/dL
Ketones, ur: NEGATIVE mg/dL
Leukocytes,Ua: NEGATIVE
Nitrite: NEGATIVE
Protein, ur: NEGATIVE mg/dL
Specific Gravity, Urine: 1.02 (ref 1.005–1.030)
Urobilinogen, UA: 0.2 mg/dL (ref 0.0–1.0)
pH: 6 (ref 5.0–8.0)

## 2022-12-14 LAB — CBG MONITORING, ED: Glucose-Capillary: 93 mg/dL (ref 70–99)

## 2022-12-14 LAB — LIPASE, BLOOD: Lipase: 44 U/L (ref 11–51)

## 2022-12-14 LAB — POC URINE PREG, ED: Preg Test, Ur: NEGATIVE

## 2022-12-14 NOTE — Discharge Instructions (Addendum)
English: Your test results in office were reassuring.  We will call you if the pending test results warrant a change in your plan of care, you may view these test results on MyChart.  I recommend that you continue to follow-up with your PCP, work on stress reduction exercises, and make sure to increase fluid intake to at least 8 cups of water daily.  If you develop any severe/worsening symptoms please go to the nearest emergency department for further evaluation.  Spanish:  Los resultados de sus pruebas en la oficina fueron tranquilizadores. Lo llamaremos si los resultados de las pruebas pendientes justifican un cambio en su plan de atencin; puede ver los resultados de Writer.  Le recomiendo que contine haciendo un seguimiento con su PCP, trabaje en ejercicios de reduccin del estrs y se asegure de aumentar la ingesta de lquidos a al menos 8 tazas de agua al [employer].  Si desarrolla algn sntoma grave o que empeora, vaya al departamento de emergencias ms cercano para una evaluacin adicional.

## 2022-12-14 NOTE — ED Triage Notes (Addendum)
Pt report high BP when using her home BP machine.BP 133/90 on home machine during triage. Pt reports HA ,nausea, with high BP 156/94 at home last night . Pt also reports CP on Friday and  CP today.. PT has a PCP who has asked her to keep a record  of BP's. Pt has an appt in March with PCP.

## 2022-12-14 NOTE — ED Provider Notes (Signed)
[facility]    CSN: [id number] Arrival date & time: 12/14/22  1015      History   Chief Complaint Chief Complaint  Patient presents with   Hypertension    HPI Alicia Miller is a 47 y.o. female.  Patient presents complaining of hypertension and intermittent right-sided chest pain that started yesterday.  Patient reports that onset of abnormal symptoms started 2 days ago, she reports after eating dinner she noticed that she was nauseous and had an episode of dizziness.  She reports that she is checked her blood pressure on 2 separate occasions at home, she reports that her blood pressure was 156/94 yesterday and she reports that she also had a separate reading that was normal at 118/84.  She reports that yesterday she received some bad news, she states that after this she began to notice the chest pain and high blood pressure.  She reports that today she has had an improvement in her symptoms but she is concerned due to the high blood pressure reading of the chest pain that occurred.  She denies any recent viral symptoms.  She reports that the episodes of dizziness occur randomly.  She reports having an episode of similar symptoms in the past where she was evaluated by a medical provider and she states that she was told to keep a record of her blood pressures.   Patient denies any past medical history of cardiopulmonary problems.  She has not taken any medications for her symptoms.   Spanish interpreter was used for translation.    Hypertension Associated symptoms include chest pain (RT sided intermittent) and headaches (Upon onset, has now resolved). Pertinent negatives include no abdominal pain and no shortness of breath.    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   Noted on EMG Dec 2020   No pertinent past medical history    Varicose veins of left leg with edema     Patient Active Problem List   Diagnosis Date Noted   Tension headache 08/30/2021   Stress 08/30/2021   Vasculitis ([place]) 12/17/2019   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   Mood changes 07/28/2019   Dyspareunia in female 07/28/2019   Menorrhagia with regular cycle 07/28/2019   S/P cholecystectomy 07/28/2019   Nevus, non-neoplastic 08/06/2017   Dermatofibroma 08/06/2017   Seborrheic keratoses 06/12/2017   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   Pruritic erythematous rash 06/12/2017   Varicose veins of left lower extremity with complications 69/62/9528   Vitamin D insufficiency 08/16/2016   Joint laxity of right knee 08/15/2016   Chronic pain of left ankle 08/15/2016   Chronic pain of right ankle 08/15/2016   Fatigue 08/15/2016   Stress incontinence, female 02/12/2016   Breast tenderness in female 02/12/2016   Allergic rhinitis 08/11/2015   Snoring 08/11/2015   Laryngopharyngeal reflux 08/11/2015    Past Surgical History:  Procedure Laterality Date   CHOLECYSTECTOMY  05/2015   ENDOVENOUS ABLATION SAPHENOUS VEIN W/ LASER Left 12/08/2017   endovenous laser ablation left greater saphenous vein and stab phlebectomy 10-20 incisions left leg by Tinnie Gens MD     OB History  Gravida  2   Para  2   Term  2   Preterm  0   AB  0   Living  2      SAB  0   IAB  0   Ectopic      Multiple  0   Live Births  2            Home Medications    Prior to Admission medications   Medication Sig Start Date End Date Taking? Authorizing Provider  rosuvastatin (CRESTOR) 20 MG tablet TAKE 1 TABLET (20 MG TOTAL) BY MOUTH DAILY. 10/15/22 10/15/23  Gildardo Pounds, NP    Family History Family History  Problem Relation Age of Onset    Diabetes Father    Uterine cancer Sister    Diabetes Sister    Breast cancer Paternal Aunt        @ unknown age   Diabetes Paternal Uncle    Diabetes Paternal Uncle    Diabetes Paternal Uncle    Diabetes Paternal Uncle    Diabetes Paternal Uncle    Diabetes Paternal Uncle    Diabetes Paternal Grandmother     Social History Social History   Tobacco Use   Smoking status: Never   Smokeless tobacco: Never  Vaping Use   Vaping Use: Never used  Substance Use Topics   Alcohol use: No   Drug use: No     Allergies   Bactrim [sulfamethoxazole-trimethoprim]   Review of Systems Review of Systems  Constitutional:  Positive for appetite change. Negative for activity change, chills, fatigue and fever.  HENT: Negative.    Eyes:  Negative for visual disturbance.  Respiratory:  Negative for cough, chest tightness, shortness of breath and wheezing.   Cardiovascular:  Positive for chest pain (RT sided intermittent). Negative for palpitations and leg swelling.  Gastrointestinal:  Positive for nausea. Negative for abdominal distention, abdominal pain, blood in stool, constipation, diarrhea and vomiting.  Endocrine: Negative.   Genitourinary: Negative.   Musculoskeletal:  Positive for back pain (RT sided upper back intermittent).       Denies any radiation of pain down RT arm   Skin:  Negative for color change and rash.  Neurological:  Positive for dizziness, light-headedness and headaches (Upon onset, has now resolved). Negative for tremors, seizures, syncope, facial asymmetry, speech difficulty, weakness and numbness.  Psychiatric/Behavioral:  Negative for suicidal ideas.        Stress and Anxiety upon onset of symptoms yesterday     Physical Exam Triage Vital Signs ED Triage Vitals [12/14/22 1135]  Enc Vitals Group     BP 129/84     Pulse Rate 73     Resp 18     Temp 98.3 F (36.8 C)     Temp src      SpO2 97 %     Weight      Height      Head Circumference      Peak Flow       Pain Score      Pain Loc      Pain Edu?      Excl. in [place]?    No data found.  Updated Vital Signs BP 129/84   Pulse 73   Temp 98.3 F (36.8 C)   Resp 18   LMP 12/06/2022   SpO2 97%     Physical Exam Vitals and nursing note reviewed.  Constitutional:      Appearance: Normal appearance.  Cardiovascular:     Rate and Rhythm: Normal rate and regular rhythm.     Heart sounds: Normal heart sounds, S1 normal and S2 normal.  Pulmonary:     Effort: Pulmonary effort is normal.     Breath sounds: Normal breath sounds and air entry. No decreased breath sounds, wheezing, rhonchi or rales.  Abdominal:     General: Abdomen is flat. There is no distension. There are no signs of injury.     Palpations: Abdomen is soft. There is no shifting dullness, fluid wave, hepatomegaly, splenomegaly, mass or pulsatile mass.     Tenderness: There is abdominal tenderness in the right lower quadrant and suprapubic area. There is no right CVA tenderness, left CVA tenderness, guarding or rebound. Negative signs include Murphy's sign and McBurney's sign.       Comments: Tenderness upon palpation of area noted on diagram, between RLQ and Suprapubic region.   Musculoskeletal:     Right shoulder: Normal. No swelling, deformity, effusion, laceration, tenderness, bony tenderness or crepitus. Normal range of motion. Normal strength. Normal pulse.     Right upper arm: Normal. No swelling, edema, deformity, lacerations, tenderness or bony tenderness.     Cervical back: Normal. No swelling, edema, deformity, erythema, signs of trauma, lacerations, rigidity, spasms, torticollis, tenderness, bony tenderness or crepitus. No pain with movement. Normal range of motion.     Thoracic back: Normal.  Neurological:     General: No focal deficit present.     Mental Status: She is alert.     GCS: GCS eye subscore is 4. GCS verbal subscore is 5. GCS motor subscore is 6.     Cranial Nerves: Cranial nerves 2-12 are intact.       UC Treatments / Results  Labs (all labs ordered are listed, but only abnormal results are displayed) Labs Reviewed  POCT URINALYSIS DIPSTICK, ED / UC - Abnormal; Notable for the following components:      Result Value   Hgb urine dipstick SMALL (*)    All other components within normal limits  CBC  COMPREHENSIVE METABOLIC PANEL  LIPASE, BLOOD  POC URINE PREG, ED  CBG MONITORING, ED    EKG   Radiology DG Chest 2 View  Result Date: 12/14/2022 CLINICAL DATA:  Right-sided chest pain EXAM: CHEST - 2 VIEW COMPARISON:  November 13, 2019 FINDINGS: The heart size and mediastinal contours are within normal limits. Both lungs are clear. The visualized skeletal structures are unremarkable. IMPRESSION: No active cardiopulmonary disease. Electronically Signed   By: Dorise Bullion III M.D.   On: 12/14/2022 13:10    Procedures Procedures (including critical care time)  Medications Ordered in UC Medications - No data to display  Initial Impression / Assessment and Plan / UC Course  I have reviewed the triage vital signs and the nursing notes.  Pertinent labs & imaging results that were available during my care of the patient were reviewed by me and considered in my medical decision making (see chart for details).     Patient was evaluated for elevated blood pressure reading, dizziness, stress, chest pain, and upper back pain.  Urinalysis showed small blood, otherwise unremarkable, low suspicion for UTI or nephrolithiasis, may be physiologic.  Urine pregnancy was negative.  CBG was 93. EKG was unremarkable.  Lab work from 10/15/2022 was reviewed, normal TSH, normal ceruloplasmin, and normal vitamin D. Chest X-Ray was unremarkable.  CBC, lipase, and CMP are pending.  Uncertain etiology of symptoms, high suspicion that symptoms are related  to a stressful event that occurred yesterday.  Patient was made aware of red flag symptoms that warrant an emergency department visit.  Patient was made  aware of results reporting protocol and MyChart.  Patient verbalized understanding of instructions.  Charting was provided using a a verbal dictation system, charting was proofread for errors, errors may occur which could change the meaning of the information charted.   Final Clinical Impressions(s) / UC Diagnoses   Final diagnoses:  Elevated blood pressure reading  Dizziness  Stress  Chest pain, unspecified type  Upper back pain on right side     Discharge Instructions      English: Your test results in office were reassuring.  We will call you if the pending test results warrant a change in your plan of care, you may view these test results on MyChart.  I recommend that you continue to follow-up with your PCP, work on stress reduction exercises, and make sure to increase fluid intake to at least 8 cups of water daily.  If you develop any severe/worsening symptoms please go to the nearest emergency department for further evaluation.  Spanish:  Los resultados de sus pruebas en la oficina fueron tranquilizadores. Lo llamaremos si los resultados de las pruebas pendientes justifican un cambio en su plan de atencin; puede ver los resultados de Writer.  Le recomiendo que contine haciendo un seguimiento con su PCP, trabaje en ejercicios de reduccin del estrs y se asegure de aumentar la ingesta de lquidos a al menos 8 tazas de agua al [employer].  Si desarrolla algn sntoma grave o que empeora, vaya al departamento de emergencias ms cercano para una evaluacin adicional.     ED Prescriptions   None    PDMP not reviewed this encounter.   Flossie Dibble, NP 12/14/22 1413

## 2022-12-17 ENCOUNTER — Telehealth: Payer: Self-pay

## 2022-12-17 NOTE — Telephone Encounter (Signed)
Health Coaching 3: 12/16/22  interpreter- Rudene Anda, [employer]   Goals- Patient would like to work on exercising more. Patient will work with taking short walks daily for 15 minutes over the next month. Once patient is comfortable she will increase her time and intensity over time. Encouraged patient to speak with PCP if she has any more issues with chest pain while exercising. Also encouraged patient to eat a snack before and after her workouts as well as staying hydrated so she doesn't feel faint during her workouts.    New goal- Patient has been having issues with high blood pressure recently. Patient recently went to Urgent Care ED for chest pain, dizziness and headache. Patient states that she has not been exercising recently. Encouraged her to FU with PCP before she begins working out again. Patient does have BP monitor at home that she can check her blood pressure with.  Barrier to reaching goal-    Strategies to overcome-    Navigation:  Patient is aware of  a follow up session. Patient is scheduled for FU on 01/15/23 @ 10:30 am.   Time- 10 minutes

## 2023-01-07 ENCOUNTER — Ambulatory Visit: Payer: Self-pay | Admitting: *Deleted

## 2023-01-07 ENCOUNTER — Ambulatory Visit: Payer: Self-pay | Attending: Critical Care Medicine | Admitting: Critical Care Medicine

## 2023-01-07 ENCOUNTER — Encounter: Payer: Self-pay | Admitting: Critical Care Medicine

## 2023-01-07 ENCOUNTER — Other Ambulatory Visit: Payer: Self-pay | Admitting: Nurse Practitioner

## 2023-01-07 VITALS — BP 129/73 | HR 74 | Ht 62.5 in | Wt 153.2 lb

## 2023-01-07 DIAGNOSIS — Q078 Other specified congenital malformations of nervous system: Secondary | ICD-10-CM

## 2023-01-07 DIAGNOSIS — N941 Unspecified dyspareunia: Secondary | ICD-10-CM

## 2023-01-07 DIAGNOSIS — I776 Arteritis, unspecified: Secondary | ICD-10-CM

## 2023-01-07 DIAGNOSIS — C530 Malignant neoplasm of endocervix: Secondary | ICD-10-CM

## 2023-01-07 DIAGNOSIS — R030 Elevated blood-pressure reading, without diagnosis of hypertension: Secondary | ICD-10-CM

## 2023-01-07 NOTE — Patient Instructions (Addendum)
you can take Tylenol or ibuprofen as needed for neck pain  Look at the attachment for back exercises  We need to get a good urinalysis and pelvic exam I will have you come back to see your primary care provider Alicia Miller in 2 weeks to have this performed after your menstrual period ends  You need to invest in a good blood pressure meter like an Omron as we discussed the current blood pressure meter you have is giving false information  Blood pressure is today normal you do not need medication for blood pressure  Puede tomar Tylenol o ibuprofeno segn sea necesario para el dolor de cuello.  Mira el archivo adjunto para ejercicios de espalda.  Necesitamos hacernos un buen anlisis de [place] y un examen plvico. Har que regrese a ver a su proveedor de atencin primaria, la [name], en 2 semanas para que se lo realicen despus de que termine su perodo menstrual.  Debe invertir en un buen medidor de presin arterial como un Omron, como comentamos, el medidor de presin arterial que tiene actualmente proporciona informacin falsa.  La presin arterial hoy es normal no necesita medicamentos para la presin arterial

## 2023-01-07 NOTE — Assessment & Plan Note (Signed)
Does not appear active

## 2023-01-07 NOTE — Telephone Encounter (Signed)
  Chief Complaint: Hypertension Symptoms: BP 139/103  last night  125/82  this AM. Is not on BP meds, states took one of her husbands pills. Headache back of head 7/10 yesterday 4/10- today Frequency: Yesterday Pertinent Negatives: Patient denies visual changes, weakness Disposition: []$ ED /[]$ Urgent Care (no appt availability in office) / [x]$ Appointment(In office/virtual)/ []$  [facility]/ []$ Home Care/ []$ Refused Recommended Disposition /[]$  Mobile Bus/ []$  Follow-up with PCP Additional Notes: Secured appt with Dr. Joya Gaskins for this afternoon. CAre advise provided, verbalizes understanding. Reason for Disposition  Systolic BP  >= 0000000 OR Diastolic >= [id number]  Answer Assessment - Initial Assessment Questions 1. BLOOD PRESSURE: "What is the blood pressure?" "Did you take at least two measurements 5 minutes apart?"     This AM 125/82     139/103 yesterday 2. ONSET: "When did you take your blood pressure?"     Yesterday and this AM 3. HOW: "How did you take your blood pressure?" (e.g., automatic home BP monitor, visiting nurse)     Home monitoring 4. HISTORY: "Do you have a history of high blood pressure?"     Yes 5. MEDICINES: "Are you taking any medicines for blood pressure?" "Have you missed any doses recently?"     No BP meds. Took one of her husbands yesterday. 6. OTHER SYMPTOMS: "Do you have any symptoms?" (e.g., blurred vision, chest pain, difficulty breathing, headache, weakness)     Headache 7/10       3-4/10 today  Protocols used: Blood Pressure - High-A-AH

## 2023-01-07 NOTE — Progress Notes (Signed)
Established Patient Office Visit  Subjective   Patient ID: Alicia Miller, female    DOB: 06/22/1976  Age: 47 y.o. MRN: [id number]  Chief Complaint  Patient presents with   Hypertension    47 y.o.F PCP Raul Del here for HTN  The patient was seen in the emergency room in January for hypertension blood pressure was not that elevated medications were not recommended.  Today patient comes in for a work in visit and visit is assisted by [employer] interpreter for [place]  Patient complains of neck pain and headaches in the posterior part of the head and concerns over blood pressure.  She shows me her blood pressure readings on her home meter which is an older model.  Blood pressure tends to run anywhere from 136/90 140/96 to 110/80 there is a lot of variability in the readings.  On arrival today blood pressure is good 129/73 when I take her blood pressure at the same time this measurement is obtained on her home meter her blood pressure is 139/86 which is much higher.  Patient was found to have blood in her urine during the ER visit has pain in the back and shoulders as well.  She also has lower abdominal pain.  She needs a pelvic exam.  She is finishing her current menstrual cycle and cannot get a urinalysis at this time because she has blood in the vagina area from the menstrual cycle currently She is on no other medication she stopped the Crestor from side effects        Review of Systems  Constitutional:  Negative for chills, diaphoresis, fever, malaise/fatigue and weight loss.  HENT:  Negative for congestion, hearing loss, nosebleeds, sore throat and tinnitus.   Eyes:  Negative for blurred vision, photophobia and redness.  Respiratory:  Negative for cough, hemoptysis, sputum production, shortness of breath, wheezing and stridor.   Cardiovascular:  Negative for chest pain, palpitations, orthopnea, claudication, leg swelling and PND.  Gastrointestinal:  Negative for  abdominal pain, blood in stool, constipation, diarrhea, heartburn, nausea and vomiting.  Genitourinary:  Negative for dysuria, flank pain, frequency, hematuria and urgency.  Musculoskeletal:  Positive for back pain and neck pain. Negative for falls, joint pain and myalgias.  Skin:  Negative for itching and rash.  Neurological:  Positive for headaches. Negative for dizziness, tingling, tremors, sensory change, speech change, focal weakness, seizures, loss of consciousness and weakness.  Endo/Heme/Allergies:  Negative for environmental allergies and polydipsia. Does not bruise/bleed easily.  Psychiatric/Behavioral:  Negative for depression, memory loss, substance abuse and suicidal ideas. The patient is not nervous/anxious and does not have insomnia.       Objective:     BP 129/73   Pulse 74   Ht 5' 2.5" (1.588 m)   Wt 153 lb 3.2 oz (69.5 kg)   LMP 12/06/2022   SpO2 100%   BMI 27.57 kg/m    Physical Exam   No results found for any visits on 01/07/23.    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    Asencion Noble, MD

## 2023-01-07 NOTE — Assessment & Plan Note (Signed)
Needs a pelvic exam and needs a urinalysis will refer back to primary for same

## 2023-01-07 NOTE — Assessment & Plan Note (Signed)
Needs a repeat pelvic exam and likely vaginal ultrasound

## 2023-01-07 NOTE — Assessment & Plan Note (Signed)
I do not think patient has hypertension and she does not need medication readings from home are spurious and she needs a new blood pressure meter I made recommendations for 1

## 2023-01-15 ENCOUNTER — Ambulatory Visit: Payer: Self-pay

## 2023-01-28 ENCOUNTER — Ambulatory Visit
Admission: RE | Admit: 2023-01-28 | Discharge: 2023-01-28 | Disposition: A | Discharge status: Home / Self Care | Payer: No Typology Code available for payment source | Source: Ambulatory Visit | Attending: Obstetrics and Gynecology | Admitting: Obstetrics and Gynecology

## 2023-01-28 DIAGNOSIS — N6310 Unspecified lump in the right breast, unspecified quadrant: Secondary | ICD-10-CM

## 2023-02-03 ENCOUNTER — Ambulatory Visit: Payer: Self-pay | Admitting: Nurse Practitioner

## 2023-02-14 ENCOUNTER — Ambulatory Visit: Payer: Self-pay | Admitting: Nurse Practitioner

## 2023-02-28 ENCOUNTER — Ambulatory Visit: Payer: Self-pay | Attending: Family Medicine

## 2023-03-04 ENCOUNTER — Encounter: Payer: Self-pay | Admitting: Nurse Practitioner

## 2023-03-04 ENCOUNTER — Ambulatory Visit: Payer: Self-pay | Attending: Nurse Practitioner | Admitting: Nurse Practitioner

## 2023-03-04 ENCOUNTER — Other Ambulatory Visit: Payer: Self-pay

## 2023-03-04 VITALS — BP 109/73 | HR 76 | Ht 62.5 in | Wt 155.8 lb

## 2023-03-04 DIAGNOSIS — Z1211 Encounter for screening for malignant neoplasm of colon: Secondary | ICD-10-CM

## 2023-03-04 DIAGNOSIS — R7989 Other specified abnormal findings of blood chemistry: Secondary | ICD-10-CM

## 2023-03-04 DIAGNOSIS — R928 Other abnormal and inconclusive findings on diagnostic imaging of breast: Secondary | ICD-10-CM

## 2023-03-04 DIAGNOSIS — E7800 Pure hypercholesterolemia, unspecified: Secondary | ICD-10-CM

## 2023-03-04 DIAGNOSIS — J309 Allergic rhinitis, unspecified: Secondary | ICD-10-CM

## 2023-03-04 DIAGNOSIS — R7303 Prediabetes: Secondary | ICD-10-CM

## 2023-03-04 DIAGNOSIS — Z23 Encounter for immunization: Secondary | ICD-10-CM

## 2023-03-04 LAB — POCT GLYCOSYLATED HEMOGLOBIN (HGB A1C): HbA1c, POC (prediabetic range): 6 % (ref 5.7–6.4)

## 2023-03-04 MED ORDER — DIPHENHYDRAMINE HCL 25 MG PO TABS
12.5000 mg | ORAL_TABLET | Freq: Four times a day (QID) | ORAL | 0 refills | Status: DC | PRN
  Filled 2023-03-04: qty 30, 8d supply, fill #0

## 2023-03-04 MED ORDER — TETANUS-DIPHTH-ACELL PERTUSSIS 5-2-15.5 LF-MCG/0.5 IM SUSP: 0.5000 mL | Freq: Once | INTRAMUSCULAR | 0 refills | Status: AC

## 2023-03-04 NOTE — Progress Notes (Signed)
Assessment & Plan:  Alicia was seen today for prediabetes.  Diagnoses and all orders for this visit:  Prediabetes -     POCT glycosylated hemoglobin (Hb A1C)  Need for Tdap vaccination -     Tdap (ADACEL) 03-19-14.5 LF-MCG/0.5 injection; Inject 0.5 mLs into the muscle once for 1 dose.  Abnormal mammogram of right breast -     Cancel: MS US BREAST LTD UNI RIGHT INC AXILLA; Future -     MM 3D DIAGNOSTIC MAMMOGRAM BILATERAL BREAST; Future -     MS US BREAST LTD UNI RIGHT INC AXILLA; Future  Elevated platelet count -     CBC with Differential  Allergic rhinitis, unspecified seasonality, unspecified trigger -     diphenhydrAMINE (BENADRYL ALLERGY) 25 MG tablet; Take 0.5-1 tablets (12.5-25 mg total) by mouth every 6 (six) hours as needed. For allergies  Hypercholesterolemia -     Lipid panel  Colon cancer screening -     Fecal occult blood, imunochemical([facility])    Patient has been counseled on age-appropriate routine health concerns for screening and prevention. These are reviewed and up-to-date. Referrals have been placed accordingly. Immunizations are up-to-date or declined.    Subjective:   Chief Complaint  Patient presents with   Prediabetes   HPI Alicia Miller 47 y.o. female presents to office today for follow up to prediabetes  VRI was used to communicate directly with patient for the entire encounter including providing detailed patient instructions.    Patient has been counseled on age-appropriate routine health concerns for screening and prevention. These are reviewed and up-to-date. Referrals have been placed accordingly. Immunizations are up-to-date or declined.     MAMMOGRAM: UTD COLON CANCER SCREENING: OVERDUE PAP SMEAR: UTD  Prediabetes Well controlled with diet and weight management.  Lab Results  Component Value Date   HGBA1C 6.0 03/04/2023    Lab Results  Component Value Date   HGBA1C 5.9 (H) 08/14/2022   Lab Results  Component  Value Date   LDLCALC 74 08/14/2022      Allergy Experiencing severe allergy symptoms: nasal congestion, itching in back of throat, cough. Loratadine, zyrtec, xyzal and benadryl ineffective. She wants to know if she can get allergy shots. Patient was urged to apply for the financial assistance program.  They were instructed to inquire at the front desk about the application process for the [place] discount, orange card or other financial assistance.        Review of Systems  Constitutional:  Negative for fever, malaise/fatigue and weight loss.  HENT:  Positive for congestion. Negative for nosebleeds.   Eyes: Negative.  Negative for blurred vision, double vision and photophobia.  Respiratory: Negative.  Negative for cough and shortness of breath.   Cardiovascular: Negative.  Negative for chest pain, palpitations and leg swelling.  Gastrointestinal: Negative.  Negative for heartburn, nausea and vomiting.  Musculoskeletal: Negative.  Negative for myalgias.  Neurological: Negative.  Negative for dizziness, focal weakness, seizures and headaches.  Endo/Heme/Allergies:  Positive for environmental allergies.  Psychiatric/Behavioral: Negative.  Negative for suicidal ideas.     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   Cancer    Cervical   Depression    High cholesterol    Laryngospasm 12/10/2016   Left-sided Bell's palsy 01/19/2016   Martin-Gruber anastomosis [facility]) Right 11/16/2019   Noted on EMG Dec  2020   No pertinent past medical history    Varicose veins of left leg with edema     Past Surgical History:  Procedure Laterality Date   CHOLECYSTECTOMY  05/2015   ENDOVENOUS ABLATION SAPHENOUS VEIN W/ LASER Left 12/08/2017   endovenous laser ablation left greater saphenous vein and stab phlebectomy 10-20 incisions left leg by Josephina Gip MD     Family  History  Problem Relation Age of Onset   Diabetes Father    Uterine cancer Sister    Diabetes Sister    Breast cancer Paternal Aunt        @ unknown age   Diabetes Paternal Uncle    Diabetes Paternal Uncle    Diabetes Paternal Uncle    Diabetes Paternal Uncle    Diabetes Paternal Uncle    Diabetes Paternal Uncle    Diabetes Paternal Grandmother     Social History Reviewed with no changes to be made today.   No outpatient medications prior to visit.   No facility-administered medications prior to visit.    Allergies  Allergen Reactions   Bactrim [Sulfamethoxazole-Trimethoprim] Itching       Objective:    BP 109/73   Pulse 76   Ht 5' 2.5" (1.588 m)   Wt 155 lb 12.8 oz (70.7 kg)   LMP 03/02/2023 (Exact Date)   SpO2 97%   BMI 28.04 kg/m  Wt Readings from Last 3 Encounters:  03/04/23 155 lb 12.8 oz (70.7 kg)  01/07/23 153 lb 3.2 oz (69.5 kg)  10/15/22 152 lb 9.6 oz (69.2 kg)    Physical Exam Vitals and nursing note reviewed.  Constitutional:      Appearance: She is well-developed.  HENT:     Head: Normocephalic and atraumatic.  Cardiovascular:     Rate and Rhythm: Normal rate and regular rhythm.     Heart sounds: Normal heart sounds. No murmur heard.    No friction rub. No gallop.  Pulmonary:     Effort: Pulmonary effort is normal. No tachypnea or respiratory distress.     Breath sounds: Normal breath sounds. No decreased breath sounds, wheezing, rhonchi or rales.  Chest:     Chest wall: No tenderness.  Abdominal:     General: Bowel sounds are normal.     Palpations: Abdomen is soft.  Musculoskeletal:        General: Normal range of motion.     Cervical back: Normal range of motion.  Skin:    General: Skin is warm and dry.  Neurological:     Mental Status: She is alert and oriented to person, place, and time.     Coordination: Coordination normal.  Psychiatric:        Behavior: Behavior normal. Behavior is cooperative.        Thought Content:  Thought content normal.        Judgment: Judgment normal.          Patient has been counseled extensively about nutrition and exercise as well as the importance of adherence with medications and regular follow-up. The patient was given clear instructions to go to ER or return to medical center if symptoms don't improve, worsen or new problems develop. The patient verbalized understanding.   Follow-up: No follow-ups on file.   Claiborne Rigg, FNP-BC [facility] [place], [place] [phone]   03/04/2023, 10:23 AM

## 2023-03-05 LAB — LIPID PANEL
Chol/HDL Ratio: 5.4 ratio — ABNORMAL HIGH (ref 0.0–4.4)
Cholesterol, Total: 241 mg/dL — ABNORMAL HIGH (ref 100–199)
HDL: 45 mg/dL (ref >39)
LDL Chol Calc (NIH): 155 mg/dL — ABNORMAL HIGH (ref 0–99)
Triglycerides: 224 mg/dL — ABNORMAL HIGH (ref 0–149)
VLDL Cholesterol Cal: 41 mg/dL — ABNORMAL HIGH (ref 5–40)

## 2023-03-05 LAB — CBC WITH DIFFERENTIAL/PLATELET
Basophils Absolute: 0.1 10*3/uL (ref 0.0–0.2)
Basos: 1 %
EOS (ABSOLUTE): 0.9 10*3/uL — ABNORMAL HIGH (ref 0.0–0.4)
Eos: 12 %
Hematocrit: 43.6 % (ref 34.0–46.6)
Hemoglobin: 14.3 g/dL (ref 11.1–15.9)
Immature Grans (Abs): 0 10*3/uL (ref 0.0–0.1)
Immature Granulocytes: 0 %
Lymphocytes Absolute: 2.2 10*3/uL (ref 0.7–3.1)
Lymphs: 32 %
MCH: 27.4 pg (ref 26.6–33.0)
MCHC: 32.8 g/dL (ref 31.5–35.7)
MCV: 84 fL (ref 79–97)
Monocytes Absolute: 0.4 10*3/uL (ref 0.1–0.9)
Monocytes: 6 %
Neutrophils Absolute: 3.3 10*3/uL (ref 1.4–7.0)
Neutrophils: 49 %
Platelets: 392 10*3/uL (ref 150–450)
RBC: 5.22 x10E6/uL (ref 3.77–5.28)
RDW: 13.3 % (ref 11.7–15.4)
WBC: 6.9 10*3/uL (ref 3.4–10.8)

## 2023-03-17 ENCOUNTER — Other Ambulatory Visit: Payer: Self-pay

## 2023-03-17 DIAGNOSIS — N6315 Unspecified lump in the right breast, overlapping quadrants: Secondary | ICD-10-CM

## 2023-06-16 ENCOUNTER — Ambulatory Visit: Payer: Self-pay | Attending: Nurse Practitioner | Admitting: Nurse Practitioner

## 2023-06-16 ENCOUNTER — Encounter: Payer: Self-pay | Admitting: Nurse Practitioner

## 2023-06-16 VITALS — BP 105/71 | HR 75 | Ht 62.5 in | Wt 156.4 lb

## 2023-06-16 DIAGNOSIS — M7989 Other specified soft tissue disorders: Secondary | ICD-10-CM

## 2023-06-16 DIAGNOSIS — N939 Abnormal uterine and vaginal bleeding, unspecified: Secondary | ICD-10-CM

## 2023-06-16 DIAGNOSIS — R102 Pelvic and perineal pain: Secondary | ICD-10-CM

## 2023-06-16 DIAGNOSIS — R7303 Prediabetes: Secondary | ICD-10-CM

## 2023-06-16 NOTE — Progress Notes (Unsigned)
Assessment & Plan:  There are no diagnoses linked to this encounter.  Patient has been counseled on age-appropriate routine health concerns for screening and prevention. These are reviewed and up-to-date. Referrals have been placed accordingly. Immunizations are up-to-date or declined.    Subjective:   Chief Complaint  Patient presents with   Foot Swelling    Right and left foot.    HPI Alicia Miller 47 y.o. female presents to office today   VRI was used to communicate directly with patient for the entire encounter including providing detailed patient instructions.    Swelling was 2 weeks ago in both feet. She had worn heels and states feet were swollen for 2 weeks afterwards. No swelling today. Usually swollen after standing for long periods of time. She does not wear compression socks at work.    She notes lower pelvic pain, cramping. Last menstrual cycle was last month. Notes irregular menstrual cycles and increased menstraul cycles with clotting and bleeding.  Associated symptoms insomnia which have resolved. Could be premenopausal.      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   Noted on EMG Dec 2020   No pertinent past medical history    Varicose veins of left leg with edema     Past Surgical History:  Procedure Laterality Date   CHOLECYSTECTOMY  05/2015   ENDOVENOUS ABLATION SAPHENOUS VEIN W/ LASER Left 12/08/2017   endovenous laser ablation left greater saphenous vein and stab phlebectomy 10-20 incisions left leg by Josephina Gip MD     Family History  Problem Relation Age of Onset   Diabetes Father    Uterine cancer Sister    Diabetes Sister     Breast cancer Paternal Aunt        @ unknown age   Diabetes Paternal Uncle    Diabetes Paternal Uncle    Diabetes Paternal Uncle    Diabetes Paternal Uncle    Diabetes Paternal Uncle    Diabetes Paternal Uncle    Diabetes Paternal Grandmother     Social History Reviewed with no changes to be made today.   Outpatient Medications Prior to Visit  Medication Sig Dispense Refill   diphenhydrAMINE (BENADRYL ALLERGY) 25 MG tablet Take 0.5-1 tablets (12.5-25 mg total) by mouth every 6 (six) hours as needed. For allergies (Patient not taking: Reported on 06/16/2023) 30 tablet 0   No facility-administered medications prior to visit.    Allergies  Allergen Reactions   Bactrim [Sulfamethoxazole-Trimethoprim] Itching       Objective:    BP 105/71 (BP Location: Left Arm, Patient Position: Sitting, Cuff Size: Normal)   Pulse 75   Ht 5' 2.5" (1.588 m)   Wt 156 lb 6.4 oz (70.9 kg)   LMP 06/12/2023   SpO2 98%   BMI 28.15 kg/m  Wt Readings from Last 3 Encounters:  06/16/23 156 lb 6.4 oz (70.9 kg)  03/04/23 155 lb 12.8 oz (70.7 kg)  01/07/23 153 lb 3.2 oz (69.5 kg)    Physical Exam       Patient has been counseled extensively about nutrition and exercise as well as the importance of adherence with  medications and regular follow-up. The patient was given clear instructions to go to ER or return to medical center if symptoms don't improve, worsen or new problems develop. The patient verbalized understanding.   Follow-up: No follow-ups on file.   Claiborne Rigg, FNP-BC [facility] [place], [place] [phone]   06/16/2023, 10:24 AM

## 2023-06-17 ENCOUNTER — Encounter: Payer: Self-pay | Admitting: Nurse Practitioner

## 2023-06-19 ENCOUNTER — Ambulatory Visit (HOSPITAL_COMMUNITY): Payer: Self-pay

## 2023-07-16 ENCOUNTER — Telehealth: Payer: Self-pay

## 2023-07-16 NOTE — Telephone Encounter (Signed)
Via, Kristeen Mans, [facility] Spanish Interpreter, call was returned to patient. Patient is scheduled for [place] [facility] 07/17/2023, needed to know if she will receive a pink card. Patient informed will receive new pink card, then will be sent to the [facility] for diagnostic mammogram, confirmed appointment is with [facility] at 9 o'clock ([address], [place])-must come to appointment first, then will go [facility]. Patient verbalized understanding.

## 2023-07-17 ENCOUNTER — Ambulatory Visit
Admission: RE | Admit: 2023-07-17 | Discharge: 2023-07-17 | Disposition: A | Discharge status: Home / Self Care | Payer: Self-pay | Source: Ambulatory Visit | Attending: Obstetrics and Gynecology | Admitting: Obstetrics and Gynecology

## 2023-07-17 ENCOUNTER — Ambulatory Visit: Payer: Self-pay | Admitting: Hematology and Oncology

## 2023-07-17 ENCOUNTER — Ambulatory Visit
Admission: RE | Admit: 2023-07-17 | Discharge: 2023-07-17 | Disposition: A | Discharge status: Home / Self Care | Payer: No Typology Code available for payment source | Source: Ambulatory Visit | Attending: Obstetrics and Gynecology | Admitting: Obstetrics and Gynecology

## 2023-07-17 VITALS — BP 123/80 | Wt 167.0 lb

## 2023-07-17 DIAGNOSIS — N6315 Unspecified lump in the right breast, overlapping quadrants: Secondary | ICD-10-CM

## 2023-07-17 NOTE — Patient Instructions (Signed)
Taught Alicia Miller about self breast awareness and gave educational materials to take home. Patient did not need a Pap smear today due to last Pap smear was in 07/11/22 per patient. Let her know [facility] will cover Pap smears every 5 years unless has a history of abnormal Pap smears. Referred patient to the [facility] for diagnostic mammogram. Appointment scheduled for 07/17/23. Patient aware of appointment and will be there. Let patient know will follow up with her within the next couple weeks with results. Alicia Miller verbalized understanding.  Pascal Lux, NP 9:11 AM

## 2023-07-17 NOTE — Progress Notes (Signed)
[name] is a 47 y.o. female who presents to [facility] clinic today with no complaints. Follow up 8 mm probable complicated cyst of right breast.    Pap Smear: Pap not smear completed today. Last Pap smear was 07/11/22 and was normal. Per patient has no history of an abnormal Pap smear. Last Pap smear result is available in Epic.   Physical exam: Breasts Breasts symmetrical. No skin abnormalities bilateral breasts. No nipple retraction bilateral breasts. No nipple discharge bilateral breasts. No lymphadenopathy. No lumps palpated bilateral breasts.  MS DIGITAL DIAG TOMO UNI RIGHT  Result Date: 07/29/2022 CLINICAL DATA:  Recall from screening to evaluate a possible mass an asymmetry over the right breast. EXAM: DIGITAL DIAGNOSTIC UNILATERAL RIGHT MAMMOGRAM WITH TOMOSYNTHESIS; ULTRASOUND RIGHT BREAST LIMITED TECHNIQUE: Right digital diagnostic mammography and breast tomosynthesis was performed.; Targeted ultrasound examination of the right breast was performed COMPARISON:  Previous exam(s). ACR Breast Density Category c: The breast tissue is heterogeneously dense, which may obscure small masses. FINDINGS: Additional images of the right breast were obtained. There is a persistent oval circumscribed mass over the upper central right breast. The questionable asymmetry over the outer right breast does not persist on additional images. Targeted ultrasound is performed, showing an oval circumscribed hypoechoic to anechoic mass over the 12 o'clock position of the right breast 7 cm from the nipple corresponding to the mammographic finding. This measures 5 x 7 x 8 mm. This likely represents a minimally complicated cyst and less likely a solid mass. Several smaller cysts are present over the 12 o'clock position of the right breast closer to the nipple during real-time sonographic evaluation. IMPRESSION: 8 mm probable complicated cyst over the 12 o'clock position of the right breast. RECOMMENDATION:  Recommend follow-up diagnostic right breast ultrasound 6 months. I have discussed the findings and recommendations with the patient via an in-person interpreter. If applicable, a reminder letter will be sent to the patient regarding the next appointment. BI-RADS CATEGORY  3: Probably benign. Electronically Signed   By: Elberta Fortis M.D.   On: 07/29/2022 12:31  MS DIGITAL SCREENING TOMO BILATERAL  Result Date: 07/15/2022 CLINICAL DATA:  Screening. EXAM: DIGITAL SCREENING BILATERAL MAMMOGRAM WITH TOMOSYNTHESIS AND CAD TECHNIQUE: Bilateral screening digital craniocaudal and mediolateral oblique mammograms were obtained. Bilateral screening digital breast tomosynthesis was performed. The images were evaluated with computer-aided detection. COMPARISON:  Previous exam(s). ACR Breast Density Category c: The breast tissue is heterogeneously dense, which may obscure small masses. FINDINGS: In the right breast, a possible mass and possible asymmetry warrants further evaluation. In the left breast, no findings suspicious for malignancy. IMPRESSION: Further evaluation is suggested for a possible mass and possible asymmetry in the right breast. RECOMMENDATION: Diagnostic mammogram and possibly ultrasound of the right breast. (Code:FI-R-50M) The patient will be contacted regarding the findings, and additional imaging will be scheduled. BI-RADS CATEGORY  0: Incomplete. Need additional imaging evaluation and/or prior mammograms for comparison. Electronically Signed   By: Sherron Ales M.D.   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 Patient is frequently able to express yellow discharge from the small mass in the skin of the right breast. EXAM: DIGITAL DIAGNOSTIC BILATERAL MAMMOGRAM  WITH TOMOSYNTHESIS AND CAD; ULTRASOUND LEFT BREAST LIMITED; ULTRASOUND RIGHT BREAST LIMITED TECHNIQUE: Bilateral  digital diagnostic mammography and breast tomosynthesis was performed. The images were evaluated with computer-aided detection.; Targeted ultrasound examination of the left breast was performed; Targeted ultrasound examination of the right breast was performed COMPARISON:  Previous exams. ACR Breast Density Category c: The breast tissue is heterogeneously dense, which may obscure small masses. FINDINGS: There is an oval mass corresponding to the palpable abnormality in the periareolar right breast measuring approximately 0.7 cm. There is an asymmetry seen in the slightly inferior left breast which spreads out on the additional images felt to be related to normal/dense fibroglandular tissue. Physical examination of the right breast reveals a mass within the skin of the lower outer areola containing a small yellow papule. Targeted ultrasound of the right breast was performed. There is a oval circumscribed hypoechoic mass within the skin of the right areola at the 8 o'clock position measuring 0.7 x 0.5 x 0.8 cm. This is overall similar in appearance when compared to prior ultrasound from 2016 with findings most compatible with a Montgomery gland cyst. Targeted ultrasound of the entire inferior/central left breast was performed with no suspicious masses or abnormality seen, only extremely dense fibroglandular tissue identified. IMPRESSION: 1. 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 2.  No findings of malignancy in either breast. RECOMMENDATION: 1.  Screening mammogram in one year.(Code:SM-B-01Y) 2. If this Montgomery gland cyst on the right areola continues to enlarge or become infected or bothersome to the patient, then recommend surgical referral for excision. I have discussed the findings and recommendations with the patient. If applicable, a reminder letter  will be sent to the patient regarding the next appointment. BI-RADS CATEGORY  2: Benign. Electronically Signed   By: Edwin Cap M.D.   On: 01/25/2021 10:51   MS DIGITAL SCREENING TOMO BILATERAL  Result Date: 07/21/2019 CLINICAL DATA:  Screening. EXAM: DIGITAL SCREENING BILATERAL MAMMOGRAM WITH TOMO AND CAD COMPARISON:  Previous exam(s). ACR Breast Density Category c: The breast tissue is heterogeneously dense, which may obscure small masses. FINDINGS: There are no findings suspicious for malignancy. Images were processed with CAD. IMPRESSION: No mammographic evidence of malignancy. A result letter of this screening mammogram will be mailed directly to the patient. RECOMMENDATION: Screening mammogram in one year. (Code:SM-B-01Y) BI-RADS CATEGORY  1: Negative. Electronically Signed   By: Norva Pavlov M.D.   On: 07/21/2019 13:08         Pelvic/Bimanual Pap is not indicated today    Smoking History: Patient has never smoked and was not referred to quit line.    Patient Navigation: Patient education provided. Access to services provided for patient through [facility] program. Natale [place] interpreter provided. No transportation provided   Colorectal Cancer Screening: Per patient has never had colonoscopy completed No complaints today.    Breast and Cervical Cancer Risk Assessment: Patient does not have family history of breast cancer, known genetic mutations, or radiation treatment to the chest before age 80. Patient does not have history of cervical dysplasia, immunocompromised, or DES exposure in-utero.  Risk Scores as of Encounter on 07/17/2023     Dondra Spry           5-year 0.91%   Lifetime 10.61%   This patient is [place] but has no documented birth country, so the [place] model used data from [place] patients to calculate their risk score. Document a birth country in the Demographics activity for a more accurate score.         Last calculated by Ellen Henri  K, CMA on 07/17/2023  at  8:58 AM        A: B[facility]CP exam without pap smear No complaints with benign exam. Follow up 8 mm probable complicated cyst of the right breast.   P: Referred patient to the [facility] for a diagnostic mammogram. Appointment scheduled 07/17/23.  Ilda Basset A, NP 07/17/2023 9:08 AM

## 2023-07-23 ENCOUNTER — Ambulatory Visit: Payer: Self-pay | Attending: Physician Assistant | Admitting: Physician Assistant

## 2023-07-23 ENCOUNTER — Encounter: Payer: Self-pay | Admitting: Physician Assistant

## 2023-07-23 ENCOUNTER — Other Ambulatory Visit: Payer: Self-pay

## 2023-07-23 VITALS — BP 114/76 | HR 80 | Temp 98.1°F | Wt 157.4 lb

## 2023-07-23 DIAGNOSIS — Z758 Other problems related to medical facilities and other health care: Secondary | ICD-10-CM

## 2023-07-23 DIAGNOSIS — Z20818 Contact with and (suspected) exposure to other bacterial communicable diseases: Secondary | ICD-10-CM

## 2023-07-23 DIAGNOSIS — R52 Pain, unspecified: Secondary | ICD-10-CM

## 2023-07-23 DIAGNOSIS — Z603 Acculturation difficulty: Secondary | ICD-10-CM

## 2023-07-23 DIAGNOSIS — J029 Acute pharyngitis, unspecified: Secondary | ICD-10-CM

## 2023-07-23 LAB — POCT RAPID STREP A (OFFICE): Rapid Strep A Screen: NEGATIVE

## 2023-07-23 MED ORDER — PROMETHAZINE-DM 6.25-15 MG/5ML PO SYRP
5.0000 mL | ORAL_SOLUTION | Freq: Four times a day (QID) | ORAL | 0 refills | Status: DC | PRN
Start: 2023-07-23 — End: 2023-12-10
  Filled 2023-07-23: qty 118, 6d supply, fill #0

## 2023-07-23 MED ORDER — AMOXICILLIN 500 MG PO CAPS
500.0000 mg | ORAL_CAPSULE | Freq: Three times a day (TID) | ORAL | 0 refills | Status: AC
Start: 2023-07-23 — End: 2023-08-02
  Filled 2023-07-23: qty 30, 10d supply, fill #0

## 2023-07-23 NOTE — Progress Notes (Signed)
Patient ID: Alicia Miller, female   DOB: 06/03/1976, 47 y.o.   MRN: [id number]   Alicia Miller, is a 47 y.o. female  OJJ:[id number]  [id number]  DOB - [birth year]  Chief Complaint  Patient presents with   Sore Throat   Generalized Body Aches       Subjective:   Alicia Miller is a 47 y.o. female here today for ST that started 2 days ago after her son was diagnosed with strep throat last week.  She is also having body aches, slight cough, and mild nausea this morning.  No fever or runny nose.  No SOB.    No problems updated.  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 Authorizing Provider  amoxicillin (AMOXIL) 500 MG capsule Take 1 capsule (500 mg total) by mouth 3 (three) times daily for 10 days. 07/23/23 08/02/23 Yes Citlally Captain, Marzella Schlein, PA-C  promethazine-dextromethorphan (PROMETHAZINE-DM) 6.25-15 MG/5ML syrup Take 5 mLs by mouth 4 (four) times daily as needed for cough. 07/23/23  Yes Kona Yusuf, Marzella Schlein, PA-C  diphenhydrAMINE (BENADRYL ALLERGY) 25 MG tablet Take 0.5-1 tablets (12.5-25 mg total) by mouth every 6 (six) hours as needed. For allergies Patient not taking: Reported on 07/23/2023 03/04/23   Claiborne Rigg, NP    ROS: Neg resp Neg cardiac Neg GI Neg GU Neg MS Neg psych Neg neuro  Objective:   Vitals:   07/23/23 1521  BP:  114/76  Pulse: 80  Temp: 98.1 F (36.7 C)  SpO2: 99%  Weight: 157 lb 6.4 oz (71.4 kg)   Exam General appearance : Awake, alert, not in any distress. Speech Clear. Not toxic looking HEENT: Atraumatic and Normocephalic, TM B congested, throat with mild to moderate erythema, no exudate.   Neck: Supple, no JVD. No cervical lymphadenopathy.  Chest: Good air entry bilaterally, CTAB.  No rales/rhonchi/wheezing CVS: S1 S2 regular, no murmurs.  Extremities: B/L Lower Ext shows no edema, both legs are warm to touch Neurology: Awake alert, and oriented X 3, CN II-XII intact, Non focal Skin: No Rash  Data Review Lab Results  Component Value Date   HGBA1C 6.0 03/04/2023   HGBA1C 5.9 (H) 08/14/2022   HGBA1C 5.5 07/31/2018    Assessment & Plan   1. Pharyngitis, unspecified etiology Covering for strep due to exposure - COVID-19, Flu A+B and RSV - POCT rapid strep A - amoxicillin (AMOXIL) 500 MG capsule; Take 1 capsule (500 mg total) by mouth 3 (three) times daily for 10 days.  Dispense: 30 capsule; Refill: 0 - promethazine-dextromethorphan (PROMETHAZINE-DM) 6.25-15 MG/5ML syrup; Take 5 mLs by mouth 4 (four) times daily as needed for cough.  Dispense: 118 mL; Refill: 0  2. Body aches - COVID-19, Flu A+B and RSV - POCT rapid strep A - amoxicillin (AMOXIL) 500 MG capsule; Take 1 capsule (  500 mg total) by mouth 3 (three) times daily for 10 days.  Dispense: 30 capsule; Refill: 0  3. Language barrier [facility] "Madaline Guthrie" interpreters used and additional time performing visit was required.   4. Exposure to strep throat - amoxicillin (AMOXIL) 500 MG capsule; Take 1 capsule (500 mg total) by mouth 3 (three) times daily for 10 days.  Dispense: 30 capsule; Refill: 0    Return if symptoms worsen or fail to improve.  The patient was given clear instructions to go to ER or return to medical center if symptoms don't improve, worsen or new problems develop. The patient verbalized understanding. The  patient was told to call to get lab results if they haven't heard anything in the next week.      Georgian Co, PA-C [facility] [place], [place] [phone]   07/23/2023, 3:44 PM

## 2023-07-24 ENCOUNTER — Telehealth: Payer: Self-pay

## 2023-07-24 NOTE — Telephone Encounter (Signed)
Copied from CRM [id number]. Topic: General - Inquiry >> Jul 24, 2023  2:23 PM De Blanch wrote: Reason for CRM: Pt is calling to f/u on recent lab results.  Requesting a callback.

## 2023-07-24 NOTE — Telephone Encounter (Signed)
There haven't been any results from the test.

## 2023-07-25 ENCOUNTER — Ambulatory Visit: Payer: Self-pay | Admitting: *Deleted

## 2023-07-25 ENCOUNTER — Telehealth: Payer: Self-pay

## 2023-07-25 LAB — COVID-19, FLU A+B AND RSV
Influenza A, NAA: NOT DETECTED
Influenza B, NAA: NOT DETECTED
RSV, NAA: NOT DETECTED
SARS-CoV-2, NAA: NOT DETECTED

## 2023-07-25 NOTE — Telephone Encounter (Signed)
-----   Message from Georgian Co sent at 07/25/2023 10:49 AM EDT ----- Please call patient.  Her test for flu, RSV, and covid is negative.  Thanks, Georgian Co, PA-C

## 2023-07-25 NOTE — Telephone Encounter (Signed)
Reason for Disposition  Difficulty breathing  Answer Assessment - Initial Assessment Questions 1. LOCATION: "Where does it hurt?"       Below the neck- mid line 2. RADIATION: "Does the pain go anywhere else?" (e.g., into neck, jaw, arms, back)     Neck, throat- more like body ache 3. ONSET: "When did the chest pain begin?" (Minutes, hours or days)      Today- neck, shoulder, throat since yetserday 4. PATTERN: "Does the pain come and go, or has it been constant since it started?"  "Does it get worse with exertion?"      Constant-not worse with movement 5. DURATION: "How long does it last" (e.g., seconds, minutes, hours)     Feels related to throat 6. SEVERITY: "How bad is the pain?"  (e.g., Scale 1-10; mild, moderate, or severe)    - MILD (1-3): doesn't interfere with normal activities     - MODERATE (4-7): interferes with normal activities or awakens from sleep    - SEVERE (8-10): excruciating pain, unable to do any normal activities       Moderate 5/10 7. CARDIAC RISK FACTORS: "Do you have any history of heart problems or risk factors for heart disease?" (e.g., angina, prior heart attack; diabetes, high blood pressure, high cholesterol, smoker, or strong family history of heart disease)     High BP 8. PULMONARY RISK FACTORS: "Do you have any history of lung disease?"  (e.g., blood clots in lung, asthma, emphysema, birth control pills)     no 9. CAUSE: "What do you think is causing the chest pain?"     Related to throat pain 10. OTHER SYMPTOMS: "Do you have any other symptoms?" (e.g., dizziness, nausea, vomiting, sweating, fever, difficulty breathing, cough)       Slight cough, SOB- fatigue  Protocols used: Chest Pain-A-AH

## 2023-07-25 NOTE — Telephone Encounter (Addendum)
Interpreter:Danna [id number]  Chief Complaint: chest pain Symptoms: chest pain- upper chest- moderate-started today, constant, pain in shoulder, neck Frequency: chest pain started today Pertinent Negatives: Patient denies dizziness, nausea, vomiting, sweating, fever, cough Disposition: [x] ED /[] Urgent Care (no appt availability in office) / [] Appointment(In office/virtual)/ []  [facility]/ [] Home Care/ [] Refused Recommended Disposition /[] [facility]/ []  Follow-up with PCP Additional Notes: Patient advised ED for evaluation of chest pain- high risk symptom.

## 2023-07-25 NOTE — Telephone Encounter (Signed)
Pt was called and vm was left, Information has been sent to nurse pool.   Interpreter id # [phone]

## 2023-07-25 NOTE — Telephone Encounter (Signed)
Noted agree with deposition

## 2023-09-03 ENCOUNTER — Encounter: Payer: Self-pay | Admitting: Nurse Practitioner

## 2023-09-03 ENCOUNTER — Ambulatory Visit: Payer: Self-pay | Attending: Nurse Practitioner | Admitting: Nurse Practitioner

## 2023-09-03 ENCOUNTER — Other Ambulatory Visit: Payer: Self-pay

## 2023-09-03 VITALS — BP 112/74 | HR 101 | Ht 62.5 in | Wt 157.4 lb

## 2023-09-03 DIAGNOSIS — R7303 Prediabetes: Secondary | ICD-10-CM

## 2023-09-03 DIAGNOSIS — J309 Allergic rhinitis, unspecified: Secondary | ICD-10-CM

## 2023-09-03 DIAGNOSIS — J302 Other seasonal allergic rhinitis: Secondary | ICD-10-CM

## 2023-09-03 LAB — POCT GLYCOSYLATED HEMOGLOBIN (HGB A1C): HbA1c, POC (prediabetic range): 5.9 % (ref 5.7–6.4)

## 2023-09-03 MED ORDER — FLUTICASONE PROPIONATE 50 MCG/ACT NA SUSP
2.0000 | Freq: Every day | NASAL | 6 refills | Status: AC
Start: 2023-09-03 — End: ?
  Filled 2023-09-03: qty 16, 30d supply, fill #0

## 2023-09-03 MED ORDER — CETIRIZINE HCL 10 MG PO TABS
10.0000 mg | ORAL_TABLET | Freq: Every day | ORAL | 11 refills | Status: AC
Start: 2023-09-03 — End: ?
  Filled 2023-09-03: qty 30, 30d supply, fill #0

## 2023-09-03 NOTE — Assessment & Plan Note (Signed)
Ongoing nasal/nares itching, burning, and productive cough. These symptoms have worsened over the past 3 days. She was initially seen in clinic for these symptoms on 07/23/23 and prescribed Amoxicillin and DM w/ Phenergan. Patient says she took the abx for 2 days because she "did not like the way it made me feel." She also has not taken previously prescribed cough syrup because "I forgot I had it". Plan to start Fluticasone 50 mcg/act 2 puffs into each nostril daily, and Cetirizine 10 mg PO daily. Referral also placed to allergy and asthma.

## 2023-09-03 NOTE — Progress Notes (Signed)
Assessment & Plan:  Alicia Miller was seen today for medical management of chronic issues.  Diagnoses and all orders for this visit:  Prediabetes -     POCT glycosylated hemoglobin (Hb A1C) Continue blood sugar control as discussed in office today, low carbohydrate diet, and regular physical exercise as tolerated, 150 minutes per week (30 min each day, 5 days per week, or 50 min 3 days per week). Keep blood sugar logs with fasting goal of 90-130 mg/dl, post prandial (after you eat) less than 180.  For Hypoglycemia: BS <60 and Hyperglycemia BS >400; contact the clinic ASAP. Annual eye exams and foot exams are recommended.   Seasonal allergies -     cetirizine (ZYRTEC) 10 MG tablet; Take 1 tablet (10 mg total) by mouth daily. -     fluticasone (FLONASE) 50 MCG/ACT nasal spray; Place 2 sprays into both nostrils daily. -     Ambulatory referral to Allergy    Patient has been counseled on age-appropriate routine health concerns for screening and prevention. These are reviewed and up-to-date. Referrals have been placed accordingly. Immunizations are up-to-date or declined.    Subjective:   Chief Complaint  Patient presents with   Medical Management of Chronic Issues   Interpreter services, Adrianna # [id number]  Patient presents for follow up prediabetes. Her prediabetes is well controlled A1C 5.9 today.   She is concerned because she is having ongoing nasal/nares itching, burning, and productive cough. These symptoms have worsened over the past 3 days. She was initially seen in clinic for these symptoms on 07/23/23 and prescribed Amoxicillin and DM w/ Phenergan. Patient says she took the abx for 2 days because she "did not like the way it made me feel." She also has not taken previously prescribed cough syrup because "I forgot I had it". Patient says she has been taking "XL3 cough and cold tablets".    Review of Systems  Constitutional:  Positive for malaise/fatigue. Negative for fever and weight  loss.  HENT:  Positive for congestion. Negative for nosebleeds.        Ears/nasal itching, runny nose  Eyes: Negative.  Negative for blurred vision, double vision and photophobia.  Respiratory:  Positive for cough and sputum production. Negative for shortness of breath.        Productive cough, clear phglem  Cardiovascular:  Negative for chest pain, palpitations and leg swelling.       Chest discomfort when coughing  Gastrointestinal: Negative.  Negative for heartburn, nausea and vomiting.  Musculoskeletal: Negative.  Negative for myalgias.  Neurological: Negative.  Negative for dizziness, focal weakness, seizures and headaches.  Psychiatric/Behavioral: Negative.  Negative for suicidal ideas.     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   Noted on EMG Dec 2020   No pertinent past medical history    Varicose veins of left leg with edema     Past Surgical History:  Procedure Laterality Date   CHOLECYSTECTOMY  05/2015   ENDOVENOUS ABLATION SAPHENOUS VEIN W/ LASER Left 12/08/2017   endovenous laser ablation left greater saphenous vein and stab phlebectomy 10-20 incisions left leg by Josephina Gip MD     Family  History  Problem Relation Age of Onset   Diabetes Father    Uterine cancer Sister    Diabetes Sister    Breast cancer Paternal Aunt        @ unknown age   Diabetes Paternal Uncle    Diabetes Paternal Uncle    Diabetes Paternal Uncle    Diabetes Paternal Uncle    Diabetes Paternal Uncle    Diabetes Paternal Uncle    Diabetes Paternal Grandmother     Social History Reviewed with no changes to be made today.   Outpatient Medications Prior to Visit  Medication Sig  Dispense Refill   diphenhydrAMINE (BENADRYL ALLERGY) 25 MG tablet Take 0.5-1 tablets (12.5-25 mg total) by mouth every 6 (six) hours as needed. For allergies (Patient not taking: Reported on 07/23/2023) 30 tablet 0   promethazine-dextromethorphan (PROMETHAZINE-DM) 6.25-15 MG/5ML syrup Take 5 mLs by mouth 4 (four) times daily as needed for cough. (Patient not taking: Reported on 09/03/2023) 118 mL 0   No facility-administered medications prior to visit.    Allergies  Allergen Reactions   Bactrim [Sulfamethoxazole-Trimethoprim] Itching       Objective:    BP 112/74 (BP Location: Left Arm, Patient Position: Sitting, Cuff Size: Normal)   Pulse (!) 101   Ht 5' 2.5" (1.588 m)   Wt 157 lb 6.4 oz (71.4 kg)   LMP 08/30/2023 (Exact Date)   SpO2 98%   BMI 28.33 kg/m  Wt Readings from Last 3 Encounters:  09/03/23 157 lb 6.4 oz (71.4 kg)  07/23/23 157 lb 6.4 oz (71.4 kg)  07/17/23 167 lb (75.8 kg)    Physical Exam Vitals and nursing note reviewed.  Constitutional:      Appearance: Normal appearance. She is well-developed.  HENT:     Head: Normocephalic and atraumatic.     Right Ear: Tympanic membrane normal.     Left Ear: Tympanic membrane normal.     Nose: Congestion and rhinorrhea present. Rhinorrhea is clear.     Right Turbinates: Enlarged.     Left Turbinates: Enlarged.     Mouth/Throat:     Mouth: Mucous membranes are moist.     Pharynx: Postnasal drip present.  Cardiovascular:     Rate and Rhythm: Regular rhythm. Tachycardia present.     Pulses: Normal pulses.     Heart sounds: Normal heart sounds. No murmur heard.    No friction rub. No gallop.  Pulmonary:     Effort: Pulmonary effort is normal. No tachypnea or respiratory distress.     Breath sounds: Normal breath sounds. No decreased breath sounds, wheezing, rhonchi or rales.  Chest:     Chest wall: No tenderness.  Abdominal:     General: Bowel sounds are normal.     Palpations: Abdomen is soft.  Musculoskeletal:         General: Normal range of motion.     Cervical back: Normal range of motion.  Skin:    General: Skin is warm and dry.  Neurological:     Mental Status: She is alert and oriented to person, place, and time.     Coordination: Coordination normal.  Psychiatric:        Behavior: Behavior normal. Behavior is cooperative.        Thought Content: Thought content normal.        Judgment: Judgment normal.          Patient has been counseled extensively about nutrition and exercise as well as the importance of adherence  with medications and regular follow-up. The patient was given clear instructions to go to ER or return to medical center if symptoms don't improve, worsen or new problems develop. The patient verbalized understanding.   Follow-up: Return in about 3 months (around 12/04/2023), or if symptoms worsen or fail to improve.   I have seen and examined this patient with the advanced practice provider student Burnard Bunting and agree with the above note   Bertram Denver FNP-BC [phone]   09/21/2023, 8:59 PM

## 2023-09-04 LAB — FECAL OCCULT BLOOD, IMMUNOCHEMICAL: Fecal Occult Bld: NEGATIVE

## 2023-09-21 ENCOUNTER — Encounter: Payer: Self-pay | Admitting: Nurse Practitioner

## 2023-12-10 ENCOUNTER — Encounter: Payer: Self-pay | Admitting: Nurse Practitioner

## 2023-12-10 ENCOUNTER — Other Ambulatory Visit: Payer: Self-pay

## 2023-12-10 ENCOUNTER — Telehealth (HOSPITAL_BASED_OUTPATIENT_CLINIC_OR_DEPARTMENT_OTHER): Payer: Self-pay | Admitting: Nurse Practitioner

## 2023-12-10 DIAGNOSIS — R109 Unspecified abdominal pain: Secondary | ICD-10-CM

## 2023-12-10 DIAGNOSIS — M2550 Pain in unspecified joint: Secondary | ICD-10-CM

## 2023-12-10 DIAGNOSIS — Z87442 Personal history of urinary calculi: Secondary | ICD-10-CM

## 2023-12-10 MED ORDER — TAMSULOSIN HCL 0.4 MG PO CAPS
0.4000 mg | ORAL_CAPSULE | Freq: Every day | ORAL | 0 refills | Status: DC
Start: 2023-12-10 — End: 2024-04-21
  Filled 2023-12-10: qty 30, 30d supply, fill #0

## 2023-12-10 NOTE — Progress Notes (Signed)
Virtual Visit Consent   Alicia Miller, you are scheduled for a virtual visit with a [facility] provider today. Just as with appointments in the office, your consent must be obtained to participate. Your consent will be active for this visit and any virtual visit you may have with one of our providers in the next 365 days. If you have a MyChart account, a copy of this consent can be sent to you electronically.  As this is a virtual visit, video technology does not allow for your provider to perform a traditional examination. This may limit your provider's ability to fully assess your condition. If your provider identifies any concerns that need to be evaluated in person or the need to arrange testing (such as labs, EKG, etc.), we will make arrangements to do so. Although advances in technology are sophisticated, we cannot ensure that it will always work on either your end or our end. If the connection with a video visit is poor, the visit may have to be switched to a telephone visit. With either a video or telephone visit, we are not always able to ensure that we have a secure connection.  By engaging in this virtual visit, you consent to the provision of healthcare and authorize for your insurance to be billed (if applicable) for the services provided during this visit. Depending on your insurance coverage, you may receive a charge related to this service.  I need to obtain your verbal consent now. Are you willing to proceed with your visit today? Alicia Miller has provided verbal consent on 12/10/2023 for a virtual visit (video or telephone). Claiborne Rigg, NP  Date: 12/10/2023 10:38 AM  Virtual Visit via Video Note   I, Claiborne Rigg, connected with  Alicia Miller  ([id number], Sep 30, 1978) on 12/10/23 at  9:50 AM EST by a video-enabled telemedicine application and verified that I am speaking with the correct person using two identifiers.  Location: Patient: Virtual Visit  Location Patient: Home Provider: Virtual Visit Location Provider: Home Office   I discussed the limitations of evaluation and management by telemedicine and the availability of in person appointments. The patient expressed understanding and agreed to proceed.    History of Present Illness: Alicia Miller is a 48 y.o. who identifies as a female who was assigned female at birth, and is being seen today for low back pain  VRI was used to communicate directly with patient for the entire encounter including providing detailed patient instructions.    She has been experiencing pain in lower back. Has history of kidney stones in 2022. Requesting tamsulosin again as she reports relief of similar pain in the past when taken. She also endorses pain all over her body specifically in her hands and elbows. Wonders if this is related to perimenopause.   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 pylori infection 04/18/2014    Allergies:  Allergies  Allergen Reactions   Bactrim [Sulfamethoxazole-Trimethoprim] Itching   Medications:  Current Outpatient Medications:    tamsulosin (FLOMAX) 0.4 MG CAPS capsule, Take 1 capsule (0.4 mg total) by mouth daily after breakfast., Disp: 30 capsule, Rfl: 0   cetirizine (ZYRTEC) 10 MG tablet, Take 1 tablet (10 mg total) by mouth daily., Disp: 30 tablet, Rfl: 11   fluticasone (FLONASE) 50 MCG/ACT nasal spray, Place 2 sprays into both nostrils daily., Disp: 16 g, Rfl: 6  Observations/Objective: Patient is well-developed, well-nourished in no acute distress.  Resting comfortably at home.  Head is normocephalic, atraumatic.  No labored breathing.  Speech is clear and coherent with logical content.  Patient is alert and oriented at baseline.    Assessment and Plan: 1. Flank pain (Primary) - tamsulosin (FLOMAX) 0.4 MG CAPS capsule; Take 1 capsule (0.4 mg total) by mouth daily after breakfast.  Dispense: 30 capsule; Refill: 0  2. History of nephrolithiasis - tamsulosin (FLOMAX) 0.4 MG CAPS capsule; Take 1 capsule (0.4 mg total) by mouth daily after breakfast.  Dispense: 30 capsule; Refill: 0  3. Arthralgia of multiple joints - Arthritis Panel; Future - VITAMIN D 25 Hydroxy (Vit-D Deficiency, Fractures); Future   Follow Up Instructions: I discussed the assessment and treatment plan with the patient. The patient was provided an opportunity to ask questions and all were answered. The patient agreed with the plan and demonstrated an understanding of the instructions.  A copy of instructions were sent to the patient via MyChart unless otherwise noted below.    The patient was advised to call back or seek an in-person evaluation if the symptoms worsen or if the condition fails to improve as anticipated.    Claiborne Rigg, NP

## 2023-12-15 ENCOUNTER — Other Ambulatory Visit: Payer: Self-pay

## 2023-12-15 ENCOUNTER — Ambulatory Visit: Payer: No Typology Code available for payment source | Attending: Nurse Practitioner

## 2023-12-15 DIAGNOSIS — M2550 Pain in unspecified joint: Secondary | ICD-10-CM

## 2023-12-15 DIAGNOSIS — R109 Unspecified abdominal pain: Secondary | ICD-10-CM

## 2023-12-15 DIAGNOSIS — Z87442 Personal history of urinary calculi: Secondary | ICD-10-CM

## 2023-12-16 ENCOUNTER — Encounter: Payer: No Typology Code available for payment source | Admitting: Nurse Practitioner

## 2023-12-16 LAB — ARTHRITIS PANEL
Anti Nuclear Antibody (ANA): NEGATIVE
Rheumatoid fact SerPl-aCnc: <10 [IU]/mL (ref <14.0)
Sed Rate: 18 mm/h (ref 0–32)
Uric Acid: 4.3 mg/dL (ref 2.6–6.2)

## 2023-12-16 LAB — URINALYSIS, COMPLETE
Bilirubin, UA: NEGATIVE
Glucose, UA: NEGATIVE
Leukocytes,UA: NEGATIVE
Nitrite, UA: NEGATIVE
Protein,UA: NEGATIVE
Specific Gravity, UA: 1.026 (ref 1.005–1.030)
Urobilinogen, Ur: 0.2 mg/dL (ref 0.2–1.0)
pH, UA: 5.5 (ref 5.0–7.5)

## 2023-12-16 LAB — MICROSCOPIC EXAMINATION
Bacteria, UA: NONE SEEN
Casts: NONE SEEN /[LPF]
WBC, UA: NONE SEEN /[HPF] (ref 0–5)

## 2023-12-16 LAB — VITAMIN D 25 HYDROXY (VIT D DEFICIENCY, FRACTURES): Vit D, 25-Hydroxy: 21.8 ng/mL — ABNORMAL LOW (ref 30.0–100.0)

## 2023-12-17 ENCOUNTER — Emergency Department (HOSPITAL_COMMUNITY): Payer: No Typology Code available for payment source

## 2023-12-17 ENCOUNTER — Emergency Department (HOSPITAL_COMMUNITY)
Admission: EM | Admit: 2023-12-17 | Discharge: 2023-12-17 | Disposition: A | Discharge status: Home / Self Care | Payer: No Typology Code available for payment source | Attending: Emergency Medicine | Admitting: Emergency Medicine

## 2023-12-17 ENCOUNTER — Other Ambulatory Visit: Payer: Self-pay

## 2023-12-17 ENCOUNTER — Encounter (HOSPITAL_COMMUNITY): Payer: Self-pay

## 2023-12-17 DIAGNOSIS — R1084 Generalized abdominal pain: Secondary | ICD-10-CM | POA: Insufficient documentation

## 2023-12-17 LAB — COMPREHENSIVE METABOLIC PANEL
ALT: 15 U/L (ref 0–44)
AST: 16 U/L (ref 15–41)
Albumin: 3.7 g/dL (ref 3.5–5.0)
Alkaline Phosphatase: 45 U/L (ref 38–126)
Anion gap: 9 (ref 5–15)
BUN: 9 mg/dL (ref 6–20)
CO2: 21 mmol/L — ABNORMAL LOW (ref 22–32)
Calcium: 8.3 mg/dL — ABNORMAL LOW (ref 8.9–10.3)
Chloride: 104 mmol/L (ref 98–111)
Creatinine, Ser: 0.59 mg/dL (ref 0.44–1.00)
GFR, Estimated: >60 mL/min (ref >60)
Glucose, Bld: 115 mg/dL — ABNORMAL HIGH (ref 70–99)
Potassium: 3.4 mmol/L — ABNORMAL LOW (ref 3.5–5.1)
Sodium: 134 mmol/L — ABNORMAL LOW (ref 135–145)
Total Bilirubin: 0.8 mg/dL (ref 0.0–1.2)
Total Protein: 7 g/dL (ref 6.5–8.1)

## 2023-12-17 LAB — CBC
HCT: 43.6 % (ref 36.0–46.0)
Hemoglobin: 14.6 g/dL (ref 12.0–15.0)
MCH: 27.4 pg (ref 26.0–34.0)
MCHC: 33.5 g/dL (ref 30.0–36.0)
MCV: 81.8 fL (ref 80.0–100.0)
Platelets: 356 10*3/uL (ref 150–400)
RBC: 5.33 MIL/uL — ABNORMAL HIGH (ref 3.87–5.11)
RDW: 13.1 % (ref 11.5–15.5)
WBC: 10.6 10*3/uL — ABNORMAL HIGH (ref 4.0–10.5)
nRBC: 0 % (ref 0.0–0.2)

## 2023-12-17 LAB — LIPASE, BLOOD: Lipase: 30 U/L (ref 11–51)

## 2023-12-17 LAB — URINALYSIS, ROUTINE W REFLEX MICROSCOPIC
Bilirubin Urine: NEGATIVE
Glucose, UA: NEGATIVE mg/dL
Ketones, ur: NEGATIVE mg/dL
Leukocytes,Ua: NEGATIVE
Nitrite: NEGATIVE
Protein, ur: 30 mg/dL — AB
Specific Gravity, Urine: 1.026 (ref 1.005–1.030)
pH: 5 (ref 5.0–8.0)

## 2023-12-17 LAB — HCG, SERUM, QUALITATIVE: Preg, Serum: NEGATIVE

## 2023-12-17 MED ORDER — ACETAMINOPHEN 500 MG PO TABS
1000.0000 mg | ORAL_TABLET | Freq: Once | ORAL | Status: DC
  Filled 2023-12-17: qty 2

## 2023-12-17 MED ORDER — IOHEXOL 350 MG/ML SOLN
75.0000 mL | Freq: Once | INTRAVENOUS | Status: AC | PRN
  Administered 2023-12-17: 75 mL via INTRAVENOUS

## 2023-12-17 MED ORDER — ONDANSETRON HCL 4 MG/2ML IJ SOLN
4.0000 mg | Freq: Once | INTRAMUSCULAR | Status: AC
  Administered 2023-12-17: 4 mg via INTRAVENOUS
  Filled 2023-12-17: qty 2

## 2023-12-17 MED ORDER — CEFDINIR 300 MG PO CAPS
300.0000 mg | ORAL_CAPSULE | Freq: Two times a day (BID) | ORAL | 0 refills | Status: AC
  Filled 2023-12-17: qty 20, 10d supply, fill #0

## 2023-12-17 MED ORDER — CEFTRIAXONE SODIUM 1 G IJ SOLR
1.0000 g | Freq: Once | INTRAMUSCULAR | Status: AC
  Administered 2023-12-17: 1 g via INTRAMUSCULAR
  Filled 2023-12-17: qty 10

## 2023-12-17 MED ORDER — KETOROLAC TROMETHAMINE 30 MG/ML IJ SOLN
30.0000 mg | Freq: Once | INTRAMUSCULAR | Status: AC
Start: 2023-12-17 — End: 2023-12-17
  Administered 2023-12-17: 30 mg via INTRAVENOUS
  Filled 2023-12-17: qty 1

## 2023-12-17 NOTE — ED Provider Notes (Signed)
[place] EMERGENCY DEPARTMENT AT [facility] Provider Note   CSN: [id number] Arrival date & time: 12/17/23  [id number]     History  Chief Complaint  Patient presents with   Abdominal Pain   HPI Alicia Miller is a 48 y.o. female status post cholecystectomy presenting for abdominal pain.  States it has been intermittent for the 2 years.  Mostly in the right flank but extends to the right upper and right lower quadrants of the abdomen.  It was much worse last night.  Endorses some nausea and malodorous urine as well.  Denies vomiting diarrhea.  States she had chills yesterday but denies fever.  Reports a history of kidney stones.  States symptoms are similar.   Abdominal Pain      Home Medications Prior to Admission medications   Medication Sig Start Date End Date Taking? Authorizing Provider  cefdinir (OMNICEF) 300 MG capsule Take 1 capsule (300 mg total) by mouth 2 (two) times daily for 10 days. 12/17/23 12/27/23 Yes Gareth Eagle, PA-C  cetirizine (ZYRTEC) 10 MG tablet Take 1 tablet (10 mg total) by mouth daily. 09/03/23   Claiborne Rigg, NP  fluticasone (FLONASE) 50 MCG/ACT nasal spray Place 2 sprays into both nostrils daily. 09/03/23   Claiborne Rigg, NP  tamsulosin (FLOMAX) 0.4 MG CAPS capsule Take 1 capsule (0.4 mg total) by mouth daily after breakfast. 12/10/23   Claiborne Rigg, NP      Allergies    Bactrim [sulfamethoxazole-trimethoprim]    Review of Systems   Review of Systems  Gastrointestinal:  Positive for abdominal pain.    Physical Exam Updated Vital Signs BP 114/77   Pulse 79   Temp 98.4 F (36.9 C)   Resp 16   SpO2 99%  Physical Exam Vitals and nursing note reviewed.  HENT:     Head: Normocephalic and atraumatic.     Mouth/Throat:     Mouth: Mucous membranes are moist.  Eyes:     General:        Right eye: No discharge.        Left eye: No discharge.     Conjunctiva/sclera: Conjunctivae normal.  Cardiovascular:     Rate and  Rhythm: Normal rate and regular rhythm.     Pulses: Normal pulses.     Heart sounds: Normal heart sounds.  Pulmonary:     Effort: Pulmonary effort is normal.     Breath sounds: Normal breath sounds.  Abdominal:     General: Abdomen is flat.     Palpations: Abdomen is soft.     Tenderness: There is right CVA tenderness.  Skin:    General: Skin is warm and dry.  Neurological:     General: No focal deficit present.  Psychiatric:        Mood and Affect: Mood normal.     ED Results / Procedures / Treatments   Labs (all labs ordered are listed, but only abnormal results are displayed) Labs Reviewed  COMPREHENSIVE METABOLIC PANEL - Abnormal; Notable for the following components:      Result Value   Sodium 134 (*)    Potassium 3.4 (*)    CO2 21 (*)    Glucose, Bld 115 (*)    Calcium 8.3 (*)    All other components within normal limits  CBC - Abnormal; Notable for the following components:   WBC 10.6 (*)    RBC 5.33 (*)    All other components within normal limits  URINALYSIS, ROUTINE W REFLEX MICROSCOPIC - Abnormal; Notable for the following components:   APPearance HAZY (*)    Hgb urine dipstick MODERATE (*)    Protein, ur 30 (*)    Bacteria, UA RARE (*)    All other components within normal limits  LIPASE, BLOOD  HCG, SERUM, QUALITATIVE    EKG EKG Interpretation Date/Time:  Wednesday December 17 2023 02:53:20 EST Ventricular Rate:  93 PR Interval:  154 QRS Duration:  92 QT Interval:  356 QTC Calculation: 442 R Axis:   88  Text Interpretation: Normal sinus rhythm Normal ECG When compared with ECG of 14-Dec-2022 11:56, No significant change was found Confirmed by Dione Booze ([id number]) on 12/17/2023 3:21:55 AM  Radiology CT ABDOMEN PELVIS W CONTRAST Result Date: 12/17/2023 CLINICAL DATA:  Acute non localized abdominal pain. EXAM: CT ABDOMEN AND PELVIS WITH CONTRAST TECHNIQUE: Multidetector CT imaging of the abdomen and pelvis was performed using the standard protocol  following bolus administration of intravenous contrast. RADIATION DOSE REDUCTION: This exam was performed according to the departmental dose-optimization program which includes automated exposure control, adjustment of the mA and/or kV according to patient size and/or use of iterative reconstruction technique. CONTRAST:  75mL OMNIPAQUE IOHEXOL 350 MG/ML SOLN COMPARISON:  KUB 03/06/2022, CT abdomen pelvis without contrast 01/15/2021 FINDINGS: Lower chest: Mild ground-glass subsegmental atelectasis within the bilateral posterior dependent lower lobes. Mild curvilinear subsegmental atelectasis versus scarring within the right middle lobe and left lower lobe. No pleural effusion. Hepatobiliary: Smooth liver contours. Minimally decreased liver attenuation diffusely similar to prior, possibly from mild fatty infiltration. Cholecystectomy clips. No intrahepatic biliary ductal dilatation. The common bile duct measures up to 10 mm in caliber, within normal limits following cholecystectomy. Pancreas: Unremarkable. No pancreatic ductal dilatation or surrounding inflammatory changes. Spleen: Normal in size without focal abnormality. Adrenals/Urinary Tract: Normal adrenals. The kidneys enhance uniformly and are symmetric in size without hydronephrosis. On the current postcontrast study, the previously seen right lower pole 4 mm and right upper pole 2 mm stones on the prior noncontrast CT are not well visualized. No stone is seen within either kidney, either ureter, or the urinary bladder. The urinary bladder is only mildly distended, limiting evaluation. No focal urinary bladder wall thickening is seen. Stomach/Bowel: Minimal sigmoid diverticulosis. No focal bowel wall thickening is seen. Normal appendix. The terminal ileum is unremarkable. No dilated loops of bowel are seen to indicate bowel obstruction. Vascular/Lymphatic: No significant vascular findings are present. No enlarged abdominal or pelvic lymph nodes. Reproductive:  The uterus is present. There is an enhancing likely intramural fibroid within the anterior aspect of the uterine fundus measuring up to 1.5 cm. No 1.6 cm peripherally enhancing likely collapsing cyst within the left adnexa. No follow-up imaging is recommended. Other: Small fat containing umbilical hernia is unchanged. No free air or free fluid is seen within the abdomen or pelvis. Musculoskeletal: No acute or significant osseous findings. IMPRESSION: 1. No acute abnormality is seen within the abdomen or pelvis. 2. Minimal sigmoid diverticulosis. No evidence of bowel inflammation or obstruction. Normal appendix. 3. Prior cholecystectomy. Electronically Signed   By: Neita Garnet M.D.   On: 12/17/2023 13:58    Procedures Procedures    Medications Ordered in ED Medications  acetaminophen (TYLENOL) tablet 1,000 mg (1,000 mg Oral Patient Refused/Not Given 12/17/23 1247)  cefTRIAXone (ROCEPHIN) injection 1 g (has no administration in time range)  ketorolac (TORADOL) 30 MG/ML injection 30 mg (30 mg Intravenous Given 12/17/23 1135)  ondansetron (ZOFRAN) injection 4 mg (4  mg Intravenous Given 12/17/23 1244)  iohexol (OMNIPAQUE) 350 MG/ML injection 75 mL (75 mLs Intravenous Contrast Given 12/17/23 1230)    ED Course/ Medical Decision Making/ A&P                                 Medical Decision Making Amount and/or Complexity of Data Reviewed Labs: ordered.  Risk OTC drugs. Prescription drug management.   Initial Impression and Ddx 48 year old well-appearing female presenting for abdominal pain.  Exam notable for right CVA tenderness.  DDx includes kidney stone, kidney infection, biliary colic, acute hepatitis, MSK, other. Patient PMH that increases complexity of ED encounter:  s/p cholecystectomy, reported history of kidney stones  Interpretation of Diagnostics - I independent reviewed and interpreted the labs as followed: leukocytosis, hematuria  - I independently visualized the following  imaging with scope of interpretation limited to determining acute life threatening conditions related to emergency care: CT ab/pelvis, which revealed no acute findings  - I personally reviewed and interpreted EKG which revealed NSR  Patient Reassessment and Ultimate Disposition/Management On reassessment, patient stated that her pain had improved.  Workup largely unremarkable but given her urinary symptoms with flank pain, we will plan to treat for mild pyelonephritis.  Workup does not suggest that she sepsis.  Started her on Omnicef.  Advised her to follow-up with her PCP.  Vital stable.  Discussed return precautions.  Discharged good condition.  Patient management required discussion with the following services or consulting groups:  None  Complexity of Problems Addressed Acute complicated illness or Injury  Additional Data Reviewed and Analyzed Further history obtained from: Past medical history and medications listed in the EMR and Prior ED visit notes  Patient Encounter Risk Assessment Prescriptions         Final Clinical Impression(s) / ED Diagnoses Final diagnoses:  Generalized abdominal pain    Rx / DC Orders ED Discharge Orders          Ordered    cefdinir (OMNICEF) 300 MG capsule  2 times daily        12/17/23 1422              Gareth Eagle, PA-C 12/17/23 1424    Gerhard Munch, MD 12/17/23 1549

## 2023-12-17 NOTE — ED Provider Triage Note (Signed)
Emergency Medicine Provider Triage Evaluation Note  Alicia Miller , a 48 y.o. female  was evaluated in triage.  Pt complains of abdominal pain, nausea, vomiting. Abd pain intermittent for years, nausea and vomiting for several days.   Review of Systems  Positive: As above, weakness, dizziness Negative: Fever, chills, vaginal bleeding, diarrhea, constipation  Physical Exam  BP 114/77   Pulse 79   Temp 98.4 F (36.9 C)   Resp 16   SpO2 99%  Gen:   Awake, no distress   Resp:  Normal effort  MSK:   Moves extremities without difficulty  Other:    Medical Decision Making  Medically screening exam initiated at 11:30 AM.  Appropriate orders placed.  Alicia Miller was informed that the remainder of the evaluation will be completed by another provider, this initial triage assessment does not replace that evaluation, and the importance of remaining in the ED until their evaluation is complete.  Pt has been in waiting room for almost 9 hours.  Labs with WBC 10.6, CMP unremarkable, preg negative, normal lipase, UA with moderate hemoglobin, protein, rare bacteria. Will add on CT, give toradol for pain   Verbena Boeding T, PA-C 12/17/23 1133

## 2023-12-17 NOTE — ED Triage Notes (Signed)
Pt is coming in for nausea and emesis that has happened over the last couple days, but she does mention having some abd pain that has been as on going issues x 2 years. She mentions that she is not pregnant. She also endorses she is somewhat weak and dizzy as well. No abnoraml bowel movements, no vaginal discharge or bleeding.

## 2023-12-17 NOTE — Discharge Instructions (Addendum)
Evaluation today was overall reassuring but given your urinary symptoms and lab findings, feel that we should treat you for UTI.  I am starting you on Omnicef.  Please take the entire course even if you are feeling better and follow-up with your primary care doctor.  If you develop a fever, worsening flank or abdominal pain or any other concerning symptom please return emergency department further evaluation.

## 2023-12-18 ENCOUNTER — Encounter: Payer: Self-pay | Admitting: Nurse Practitioner

## 2023-12-23 ENCOUNTER — Other Ambulatory Visit: Payer: Self-pay

## 2024-01-15 ENCOUNTER — Encounter: Payer: Self-pay | Admitting: Internal Medicine

## 2024-01-15 ENCOUNTER — Ambulatory Visit: Payer: Self-pay | Attending: Internal Medicine | Admitting: Internal Medicine

## 2024-01-15 VITALS — BP 103/66 | HR 75 | Temp 98.3°F | Ht 62.0 in | Wt 149.0 lb

## 2024-01-15 DIAGNOSIS — R109 Unspecified abdominal pain: Secondary | ICD-10-CM

## 2024-01-15 DIAGNOSIS — R3129 Other microscopic hematuria: Secondary | ICD-10-CM

## 2024-01-15 DIAGNOSIS — D259 Leiomyoma of uterus, unspecified: Secondary | ICD-10-CM

## 2024-01-15 NOTE — Progress Notes (Signed)
 Patient ID: Alicia Miller, female    DOB: 09/22/1976  MRN: [id number]  CC: Follow-up (ER f/u. /No questions / concerns/No to flu vax)   Subjective: Alicia Miller is a 48 y.o. female who presents for ER f/u. Her concerns today include:  Pt with hx of preDM, allergies, varicose veins.  [employer] Language interpreter used during this encounter. #Carlos [phone]  Pt presents as f/u from ER visit 12/17/23 for pain in RT flank that wrapped around to RT side of abdomen.  CAT scan the abdomen revealed no kidney stones.  Incidental finding of small uterine fibroid.  UA showed moderate Hgb with small amounts noted on previous UAs as well.  UA showed increased WBC.  She was treated for possible bladder/kidney infection with Omnicef. She completed the antibiotics.  She is feeling better.  Has slight residual pain sometimes in the right flank and over the anterior lateral rib cage. No fever, dysuria or hematuria.  She does not recall whether she was on her menstrual cycle when she was seen in the emergency room.  Thinks her last menstrual cycle was on the eighth of this month.  Reports that menstrual cycles have been heavy for the past 1 year lasting about 4 days.  Most recent CBC revealed normal hemoglobin of 14.6. 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 pylori infection 04/18/2014     Current Outpatient Medications on File Prior to Visit  Medication Sig Dispense Refill   cetirizine (ZYRTEC) 10 MG tablet Take 1 tablet (10 mg total) by mouth daily. 30 tablet 11   fluticasone (FLONASE) 50 MCG/ACT nasal spray Place 2 sprays into both nostrils daily. 16 g 6   tamsulosin (FLOMAX) 0.4 MG CAPS capsule Take 1 capsule (0.4 mg total) by mouth daily after breakfast. 30 capsule 0   No current facility-administered medications on file prior to visit.    Allergies  Allergen Reactions   Bactrim [Sulfamethoxazole-Trimethoprim] Itching    Social History   Socioeconomic History   Marital status: Married    Spouse name: Not on file   Number of children: 2   Years of education: Not on file   Highest education level: 6th grade  Occupational History   Not on file  Tobacco Use   Smoking status: Never   Smokeless tobacco: Never  Vaping Use   Vaping status: Never Used  Substance and Sexual Activity   Alcohol use: No   Drug use:  No   Sexual activity: Yes    Birth control/protection: None  Other Topics Concern   Not on file  Social History Narrative   Not on file   Social Drivers of Health   Financial Resource Strain: Low Risk  (09/03/2023)   Overall Financial Resource Strain (CARDIA)    Difficulty of Paying Living Expenses: Not hard at all  Food Insecurity: No Food Insecurity (09/03/2023)   Hunger Vital Sign    Worried About Running Out of Food in the Last Year: Never true    Ran  Out of Food in the Last Year: Never true  Transportation Needs: No Transportation Needs (09/03/2023)   PRAPARE - Administrator, Civil Service (Medical): No    Lack of Transportation (Non-Medical): No  Physical Activity: Unknown (09/03/2023)   Exercise Vital Sign    Days of Exercise per Week: 0 days    Minutes of Exercise per Session: Not on file  Stress: No Stress Concern Present (09/03/2023)   [employer] of Occupational Health - Occupational Stress Questionnaire    Feeling of Stress : Not at all  Social Connections: Moderately Integrated (09/03/2023)   Social Connection and Isolation Panel [NHANES]    Frequency of Communication with Friends and Family: Once a week    Frequency of Social Gatherings with Friends and Family: Once a week    Attends Religious Services: 1 to 4 times per year    Active Member of [employer] or Organizations: Yes    Attends Banker Meetings: 1 to 4 times per year    Marital Status: Married  Catering manager Violence: Not At Risk (09/03/2023)   Humiliation, Afraid, Rape, and Kick questionnaire    Fear of Current or Ex-Partner: No    Emotionally Abused: No    Physically Abused: No    Sexually Abused: No    Family History  Problem Relation Age of Onset   Diabetes Father    Uterine cancer Sister    Diabetes Sister    Breast cancer Paternal Aunt        @ unknown age   Diabetes Paternal Uncle    Diabetes Paternal Uncle    Diabetes Paternal Uncle    Diabetes Paternal Uncle    Diabetes Paternal Uncle    Diabetes Paternal Uncle    Diabetes Paternal Grandmother     Past Surgical History:  Procedure Laterality Date   CHOLECYSTECTOMY  05/2015   ENDOVENOUS ABLATION SAPHENOUS VEIN W/ LASER Left 12/08/2017   endovenous laser ablation left greater saphenous vein and stab phlebectomy 10-20 incisions left leg by Josephina Gip MD     ROS: Review of Systems Negative except as stated above  PHYSICAL EXAM: BP 103/66 (BP Location:  Left Arm, Patient Position: Sitting, Cuff Size: Normal)   Pulse 75   Temp 98.3 F (36.8 C) (Oral)   Ht 5\' 2"  (1.575 m)   Wt 149 lb (67.6 kg)   SpO2 97%   BMI 27.25 kg/m   Physical Exam  General appearance - alert, well appearing, and in no distress Mental status - normal mood, behavior, speech, dress, motor activity, and thought processes Abdomen - soft, nontender, nondistended, no masses or organomegaly Musculoskeletal -no flank tenderness.      Latest Ref Rng & Units 12/17/2023    2:52 AM 06/16/2023   10:55 AM 12/14/2022   12:27 PM  CMP  Glucose 70 - 99 mg/dL 829  93  80   BUN 6 - 20 mg/dL 9  10  13   Creatinine 0.44 - 1.00 mg/dL 4.09  8.11  9.14   Sodium 135 - 145 mmol/L 134  138  136   Potassium 3.5 - 5.1 mmol/L 3.4  4.3  3.9   Chloride 98 - 111 mmol/L 104  102  101   CO2 22 - 32 mmol/L 21  23  26    Calcium 8.9 - 10.3 mg/dL 8.3  9.3  9.4   Total Protein 6.5 - 8.1 g/dL 7.0  7.0  7.1   Total Bilirubin 0.0 - 1.2 mg/dL 0.8  0.3  0.7   Alkaline Phos 38 - 126 U/L 45  57  51   AST 15 - 41 U/L 16  15  22    ALT 0 - 44 U/L 15  14  20     Lipid Panel     Component Value Date/Time   CHOL 241 (H) 03/04/2023 1038   TRIG 224 (H) 03/04/2023 1038   HDL 45 03/04/2023 1038   CHOLHDL 5.4 (H) 03/04/2023 1038   LDLCALC 155 (H) 03/04/2023 1038    CBC    Component Value Date/Time   WBC 10.6 (H) 12/17/2023 0252   RBC 5.33 (H) 12/17/2023 0252   HGB 14.6 12/17/2023 0252   HGB 14.3 03/04/2023 1038   HCT 43.6 12/17/2023 0252   HCT 43.6 03/04/2023 1038   PLT 356 12/17/2023 0252   PLT 392 03/04/2023 1038   MCV 81.8 12/17/2023 0252   MCV 84 03/04/2023 1038   MCH 27.4 12/17/2023 0252   MCHC 33.5 12/17/2023 0252   RDW 13.1 12/17/2023 0252   RDW 13.3 03/04/2023 1038   LYMPHSABS 2.2 03/04/2023 1038   MONOABS 0.5 01/15/2021 1056   EOSABS 0.9 (H) 03/04/2023 1038   BASOSABS 0.1 03/04/2023 1038    ASSESSMENT AND PLAN: 1. Flank pain (Primary) Better having completed abx.  2.  Microscopic hematuria Given Hgb seen on previous UA, will recheck UA today to see if any blood in urine when she is not on menses - Urinalysis, Routine w reflex microscopic  3. Uterine leiomyoma, unspecified location Patient reports heavy menses but normally last 4 days.  Hemoglobin has been stable in the 14's.  Discussed with her what her uterine fibroids.  Given that she is having regular menses with normal hemoglobin, I think we can observe for now.     Patient was given the opportunity to ask questions.  Patient verbalized understanding of the plan and was able to repeat key elements of the plan.   This documentation was completed using Paediatric nurse.  Any transcriptional errors are unintentional.  No orders of the defined types were placed in this encounter.    Requested Prescriptions    No prescriptions requested or ordered in this encounter    No follow-ups on file.  Jonah Blue, MD, FACP

## 2024-01-16 LAB — URINALYSIS, ROUTINE W REFLEX MICROSCOPIC
Bilirubin, UA: NEGATIVE
Glucose, UA: NEGATIVE
Leukocytes,UA: NEGATIVE
Nitrite, UA: NEGATIVE
Protein,UA: NEGATIVE
RBC, UA: NEGATIVE
Specific Gravity, UA: >=1.03 — AB (ref 1.005–1.030)
Urobilinogen, Ur: 0.2 mg/dL (ref 0.2–1.0)
pH, UA: 6 (ref 5.0–7.5)

## 2024-03-03 ENCOUNTER — Encounter: Payer: Self-pay | Admitting: Nurse Practitioner

## 2024-03-03 ENCOUNTER — Ambulatory Visit: Payer: Self-pay | Attending: Nurse Practitioner | Admitting: Nurse Practitioner

## 2024-03-03 ENCOUNTER — Other Ambulatory Visit: Payer: Self-pay

## 2024-03-03 VITALS — BP 98/67 | HR 77 | Resp 19 | Ht 60.0 in | Wt 151.0 lb

## 2024-03-03 DIAGNOSIS — R7303 Prediabetes: Secondary | ICD-10-CM

## 2024-03-03 DIAGNOSIS — M25512 Pain in left shoulder: Secondary | ICD-10-CM

## 2024-03-03 DIAGNOSIS — Z0000 Encounter for general adult medical examination without abnormal findings: Secondary | ICD-10-CM

## 2024-03-03 DIAGNOSIS — M79642 Pain in left hand: Secondary | ICD-10-CM

## 2024-03-03 MED ORDER — DICLOFENAC SODIUM 1 % EX GEL
4.0000 g | Freq: Four times a day (QID) | CUTANEOUS | 1 refills | Status: AC
  Filled 2024-03-03: qty 100, 10d supply, fill #0

## 2024-03-03 NOTE — Progress Notes (Signed)
 Assessment & Plan:  Alicia Miller was seen today for annual exam.  Diagnoses and all orders for this visit:  Encounter for annual physical exam -     CBC with Differential -     CMP14+EGFR -     Hemoglobin A1c -     Lipid panel  Acute pain of left shoulder -     diclofenac Sodium (VOLTAREN) 1 % GEL; Apply 4 g topically 4 (four) times daily. Avoid sleeping on affected side  Left hand pain -     diclofenac Sodium (VOLTAREN) 1 % GEL; Apply 4 g topically 4 (four) times daily. Wrist splint/brace for 4-6 weeks  Prediabetes -     CMP14+EGFR -     Hemoglobin A1c    Patient has been counseled on age-appropriate routine health concerns for screening and prevention. These are reviewed and up-to-date. Referrals have been placed accordingly. Immunizations are up-to-date or declined.    Subjective:   Chief Complaint  Patient presents with   Annual Exam    Alicia Miller 48 y.o. female presents to office today for annual physical exam  VRI was used to communicate directly with patient for the entire encounter including providing detailed patient instructions.    She has left shoulder, elbow and left hand pain. States she was told in the past that she had carpal tunnel in both hands and would need surgery however the right hand pain resolved over time but the left hand and wrist pain persisted. There is associated swelling and pain in the left wrist. Her left shoulder pain is aggravated by lying down on the affected side at night. There is currently no pain present on exam. There is palpable left elbow pain but no swelling or evidence or bursal sac.       Review of Systems  Constitutional:  Negative for fever, malaise/fatigue and weight loss.  HENT: Negative.  Negative for nosebleeds.   Eyes: Negative.  Negative for blurred vision, double vision and photophobia.  Respiratory: Negative.  Negative for cough and shortness of breath.   Cardiovascular: Negative.  Negative for chest pain,  palpitations and leg swelling.  Gastrointestinal: Negative.  Negative for heartburn, nausea and vomiting.  Genitourinary: Negative.   Musculoskeletal:  Positive for joint pain. Negative for myalgias.  Skin: Negative.   Neurological: Negative.  Negative for dizziness, focal weakness, seizures and headaches.  Endo/Heme/Allergies: Negative.   Psychiatric/Behavioral: Negative.  Negative for suicidal ideas.     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   Noted on EMG Dec 2020   No pertinent past medical history    Varicose veins of left leg with edema     Past Surgical History:  Procedure Laterality Date   CHOLECYSTECTOMY  05/2015   ENDOVENOUS ABLATION SAPHENOUS VEIN W/ LASER Left 12/08/2017   endovenous laser ablation left greater saphenous vein and stab phlebectomy 10-20 incisions left leg by Josephina Gip MD     Family History  Problem Relation Age of Onset   Diabetes Father    Uterine cancer Sister    Diabetes Sister    Breast cancer Paternal Aunt        @  unknown age   Diabetes Paternal Uncle    Diabetes Paternal Uncle    Diabetes Paternal Uncle    Diabetes Paternal Uncle    Diabetes Paternal Uncle    Diabetes Paternal Uncle    Diabetes Paternal Grandmother     Social History Reviewed with no changes to be made today.   Outpatient Medications Prior to Visit  Medication Sig Dispense Refill   cetirizine (ZYRTEC) 10 MG tablet Take 1 tablet (10 mg total) by mouth daily. 30 tablet 11   fluticasone (FLONASE) 50 MCG/ACT nasal spray Place 2 sprays into both nostrils daily. 16 g 6   tamsulosin (FLOMAX) 0.4 MG CAPS capsule Take 1 capsule (0.4 mg total) by mouth daily after  breakfast. 30 capsule 0   No facility-administered medications prior to visit.    Allergies  Allergen Reactions   Bactrim [Sulfamethoxazole-Trimethoprim] Itching       Objective:    BP 98/67 (BP Location: Left Arm, Patient Position: Sitting, Cuff Size: Normal)   Pulse 77   Resp 19   Ht 5' (1.524 m)   Wt 151 lb (68.5 kg)   LMP 02/12/2024 (Exact Date)   SpO2 100%   BMI 29.49 kg/m  Wt Readings from Last 3 Encounters:  03/03/24 151 lb (68.5 kg)  01/15/24 149 lb (67.6 kg)  09/03/23 157 lb 6.4 oz (71.4 kg)    Physical Exam Constitutional:      Appearance: She is well-developed.  HENT:     Head: Normocephalic and atraumatic.     Right Ear: Hearing, tympanic membrane, ear canal and external ear normal.     Left Ear: Hearing, tympanic membrane, ear canal and external ear normal.     Nose: Nose normal.     Right Turbinates: Not enlarged.     Left Turbinates: Not enlarged.     Mouth/Throat:     Lips: Pink.     Mouth: Mucous membranes are moist.     Dentition: No dental tenderness, gingival swelling, dental abscesses or gum lesions.     Pharynx: No oropharyngeal exudate.  Eyes:     General: No scleral icterus.       Right eye: No discharge.     Extraocular Movements: Extraocular movements intact.     Conjunctiva/sclera: Conjunctivae normal.     Pupils: Pupils are equal, round, and reactive to light.  Neck:     Thyroid: No thyromegaly.     Trachea: No tracheal deviation.  Cardiovascular:     Rate and Rhythm: Normal rate and regular rhythm.     Heart sounds: Normal heart sounds. No murmur heard.    No friction rub.  Pulmonary:     Effort: Pulmonary effort is normal. No accessory muscle usage or respiratory distress.     Breath sounds: Normal breath sounds. No decreased breath sounds, wheezing, rhonchi or rales.  Abdominal:     General: Bowel sounds are normal. There is no distension.     Palpations: Abdomen is soft. There is no mass.     Tenderness: There is no  abdominal tenderness. There is no right CVA tenderness, left CVA tenderness, guarding or rebound.     Hernia: No hernia is present.  Musculoskeletal:        General: No deformity. Normal range of motion.     Left wrist: Swelling and bony tenderness present.     Left hand: Swelling and bony tenderness present.     Cervical back: Normal range of motion and neck supple.  Lymphadenopathy:  Cervical: No cervical adenopathy.  Skin:    General: Skin is warm and dry.     Findings: No erythema.  Neurological:     Mental Status: She is alert and oriented to person, place, and time.     Cranial Nerves: No cranial nerve deficit.     Motor: Motor function is intact.     Coordination: Coordination is intact. Coordination normal.     Gait: Gait is intact.     Deep Tendon Reflexes:     Reflex Scores:      Patellar reflexes are 1+ on the right side and 1+ on the left side. Psychiatric:        Attention and Perception: Attention normal.        Mood and Affect: Mood normal.        Speech: Speech normal.        Behavior: Behavior normal.        Thought Content: Thought content normal.        Judgment: Judgment normal.          Patient has been counseled extensively about nutrition and exercise as well as the importance of adherence with medications and regular follow-up. The patient was given clear instructions to go to ER or return to medical center if symptoms don't improve, worsen or new problems develop. The patient verbalized understanding.   Follow-up: Return in 7 weeks (on 04/21/2024), or if symptoms worsen or fail to improve, for shoulder and wrist pain.   Collins Dean, FNP-BC [facility] and [facility], [place] [phone]   03/03/2024, 12:30 PM

## 2024-03-04 LAB — HEMOGLOBIN A1C
Est. average glucose Bld gHb Est-mCnc: 117 mg/dL
Hgb A1c MFr Bld: 5.7 % — ABNORMAL HIGH (ref 4.8–5.6)

## 2024-03-04 LAB — CMP14+EGFR
ALT: 14 IU/L (ref 0–32)
AST: 16 IU/L (ref 0–40)
Albumin: 4.5 g/dL (ref 3.9–4.9)
Alkaline Phosphatase: 58 IU/L (ref 44–121)
BUN/Creatinine Ratio: 16 (ref 9–23)
BUN: 11 mg/dL (ref 6–24)
Bilirubin Total: 0.4 mg/dL (ref 0.0–1.2)
CO2: 20 mmol/L (ref 20–29)
Calcium: 9.4 mg/dL (ref 8.7–10.2)
Chloride: 102 mmol/L (ref 96–106)
Creatinine, Ser: 0.69 mg/dL (ref 0.57–1.00)
Globulin, Total: 2.8 g/dL (ref 1.5–4.5)
Glucose: 97 mg/dL (ref 70–99)
Potassium: 4.7 mmol/L (ref 3.5–5.2)
Sodium: 137 mmol/L (ref 134–144)
Total Protein: 7.3 g/dL (ref 6.0–8.5)
eGFR: 108 mL/min/{1.73_m2} (ref >59)

## 2024-03-04 LAB — CBC WITH DIFFERENTIAL/PLATELET
Basophils Absolute: 0.1 10*3/uL (ref 0.0–0.2)
Basos: 1 %
EOS (ABSOLUTE): 0.6 10*3/uL — ABNORMAL HIGH (ref 0.0–0.4)
Eos: 9 %
Hematocrit: 43.1 % (ref 34.0–46.6)
Hemoglobin: 14.5 g/dL (ref 11.1–15.9)
Immature Grans (Abs): 0 10*3/uL (ref 0.0–0.1)
Immature Granulocytes: 0 %
Lymphocytes Absolute: 2.2 10*3/uL (ref 0.7–3.1)
Lymphs: 31 %
MCH: 27.8 pg (ref 26.6–33.0)
MCHC: 33.6 g/dL (ref 31.5–35.7)
MCV: 83 fL (ref 79–97)
Monocytes Absolute: 0.4 10*3/uL (ref 0.1–0.9)
Monocytes: 6 %
Neutrophils Absolute: 3.8 10*3/uL (ref 1.4–7.0)
Neutrophils: 53 %
Platelets: 420 10*3/uL (ref 150–450)
RBC: 5.21 x10E6/uL (ref 3.77–5.28)
RDW: 12.9 % (ref 11.7–15.4)
WBC: 7.1 10*3/uL (ref 3.4–10.8)

## 2024-03-04 LAB — LIPID PANEL
Chol/HDL Ratio: 4.7 ratio — ABNORMAL HIGH (ref 0.0–4.4)
Cholesterol, Total: 236 mg/dL — ABNORMAL HIGH (ref 100–199)
HDL: 50 mg/dL (ref >39)
LDL Chol Calc (NIH): 157 mg/dL — ABNORMAL HIGH (ref 0–99)
Triglycerides: 160 mg/dL — ABNORMAL HIGH (ref 0–149)
VLDL Cholesterol Cal: 29 mg/dL (ref 5–40)

## 2024-03-09 ENCOUNTER — Encounter: Payer: Self-pay | Admitting: Nurse Practitioner

## 2024-03-25 ENCOUNTER — Emergency Department (HOSPITAL_COMMUNITY)
Admission: EM | Admit: 2024-03-25 | Discharge: 2024-03-25 | Disposition: A | Discharge status: Home / Self Care | Attending: Emergency Medicine | Admitting: Emergency Medicine

## 2024-03-25 ENCOUNTER — Emergency Department (HOSPITAL_COMMUNITY)

## 2024-03-25 ENCOUNTER — Other Ambulatory Visit: Payer: Self-pay

## 2024-03-25 ENCOUNTER — Encounter (HOSPITAL_COMMUNITY): Payer: Self-pay

## 2024-03-25 ENCOUNTER — Ambulatory Visit (HOSPITAL_COMMUNITY)
Admission: EM | Admit: 2024-03-25 | Discharge: 2024-03-25 | Disposition: A | Discharge status: Home / Self Care | Attending: Emergency Medicine | Admitting: Emergency Medicine

## 2024-03-25 DIAGNOSIS — Z8541 Personal history of malignant neoplasm of cervix uteri: Secondary | ICD-10-CM | POA: Insufficient documentation

## 2024-03-25 DIAGNOSIS — R1031 Right lower quadrant pain: Secondary | ICD-10-CM | POA: Insufficient documentation

## 2024-03-25 DIAGNOSIS — K5792 Diverticulitis of intestine, part unspecified, without perforation or abscess without bleeding: Secondary | ICD-10-CM | POA: Insufficient documentation

## 2024-03-25 LAB — COMPREHENSIVE METABOLIC PANEL WITH GFR
ALT: 14 U/L (ref 0–44)
AST: 14 U/L — ABNORMAL LOW (ref 15–41)
Albumin: 3.6 g/dL (ref 3.5–5.0)
Alkaline Phosphatase: 40 U/L (ref 38–126)
Anion gap: 10 (ref 5–15)
BUN: 11 mg/dL (ref 6–20)
CO2: 25 mmol/L (ref 22–32)
Calcium: 9.1 mg/dL (ref 8.9–10.3)
Chloride: 101 mmol/L (ref 98–111)
Creatinine, Ser: 0.62 mg/dL (ref 0.44–1.00)
GFR, Estimated: >60 mL/min (ref >60)
Glucose, Bld: 109 mg/dL — ABNORMAL HIGH (ref 70–99)
Potassium: 3.5 mmol/L (ref 3.5–5.1)
Sodium: 136 mmol/L (ref 135–145)
Total Bilirubin: 0.7 mg/dL (ref 0.0–1.2)
Total Protein: 7.3 g/dL (ref 6.5–8.1)

## 2024-03-25 LAB — POCT URINALYSIS DIP (MANUAL ENTRY)
Bilirubin, UA: NEGATIVE
Glucose, UA: NEGATIVE mg/dL
Leukocytes, UA: NEGATIVE
Nitrite, UA: NEGATIVE
Protein Ur, POC: NEGATIVE mg/dL
Spec Grav, UA: >=1.03 — AB (ref 1.010–1.025)
Urobilinogen, UA: 0.2 U/dL
pH, UA: 5.5 (ref 5.0–8.0)

## 2024-03-25 LAB — URINALYSIS, ROUTINE W REFLEX MICROSCOPIC
Bilirubin Urine: NEGATIVE
Glucose, UA: NEGATIVE mg/dL
Hgb urine dipstick: NEGATIVE
Ketones, ur: 20 mg/dL — AB
Leukocytes,Ua: NEGATIVE
Nitrite: NEGATIVE
Protein, ur: 30 mg/dL — AB
Specific Gravity, Urine: 1.028 (ref 1.005–1.030)
pH: 5 (ref 5.0–8.0)

## 2024-03-25 LAB — LIPASE, BLOOD: Lipase: 35 U/L (ref 11–51)

## 2024-03-25 LAB — CBC
HCT: 40.4 % (ref 36.0–46.0)
Hemoglobin: 13.3 g/dL (ref 12.0–15.0)
MCH: 27.5 pg (ref 26.0–34.0)
MCHC: 32.9 g/dL (ref 30.0–36.0)
MCV: 83.5 fL (ref 80.0–100.0)
Platelets: 466 10*3/uL — ABNORMAL HIGH (ref 150–400)
RBC: 4.84 MIL/uL (ref 3.87–5.11)
RDW: 12.6 % (ref 11.5–15.5)
WBC: 8.7 10*3/uL (ref 4.0–10.5)
nRBC: 0 % (ref 0.0–0.2)

## 2024-03-25 LAB — HCG, SERUM, QUALITATIVE: Preg, Serum: NEGATIVE

## 2024-03-25 MED ORDER — AMOXICILLIN-POT CLAVULANATE 875-125 MG PO TABS
1.0000 | ORAL_TABLET | Freq: Once | ORAL | Status: AC
  Administered 2024-03-25: 1 via ORAL
  Filled 2024-03-25: qty 1

## 2024-03-25 MED ORDER — SODIUM CHLORIDE 0.9 % IV BOLUS
1000.0000 mL | Freq: Once | INTRAVENOUS | Status: AC
  Administered 2024-03-25: 1000 mL via INTRAVENOUS

## 2024-03-25 MED ORDER — KETOROLAC TROMETHAMINE 30 MG/ML IJ SOLN
INTRAMUSCULAR | Status: AC
  Filled 2024-03-25: qty 1

## 2024-03-25 MED ORDER — AMOXICILLIN-POT CLAVULANATE 875-125 MG PO TABS
1.0000 | ORAL_TABLET | Freq: Two times a day (BID) | ORAL | 0 refills | Status: DC
  Filled 2024-03-25: qty 14, 7d supply, fill #0

## 2024-03-25 MED ORDER — ONDANSETRON 4 MG PO TBDP
4.0000 mg | ORAL_TABLET | Freq: Three times a day (TID) | ORAL | 0 refills | Status: DC | PRN
  Filled 2024-03-25: qty 20, 7d supply, fill #0

## 2024-03-25 MED ORDER — KETOROLAC TROMETHAMINE 30 MG/ML IJ SOLN
30.0000 mg | Freq: Once | INTRAMUSCULAR | Status: AC
  Administered 2024-03-25: 30 mg via INTRAMUSCULAR

## 2024-03-25 MED ORDER — OXYCODONE HCL 5 MG PO TABS
5.0000 mg | ORAL_TABLET | ORAL | 0 refills | Status: DC | PRN
  Filled 2024-03-25: qty 10, 2d supply, fill #0

## 2024-03-25 MED ORDER — IOHEXOL 350 MG/ML SOLN
60.0000 mL | Freq: Once | INTRAVENOUS | Status: AC | PRN
  Administered 2024-03-25: 60 mL via INTRAVENOUS

## 2024-03-25 NOTE — Discharge Instructions (Signed)
 You have diverticulitis.  You received your first dose of antibiotic in the emergency department.  Additional antibiotic sent into the pharmacy for you.  Follow-up with your primary care doctor as well as the gastroenterologist listed above.  Return for any concerning symptoms.

## 2024-03-25 NOTE — ED Notes (Signed)
 Patient is being discharged from the Urgent Care and sent to the Emergency Department via POV . Per Creighton Doffing, PA-C, patient is in need of higher level of care due to abdominal pain. Patient is aware and verbalizes understanding of plan of care.  Vitals:   03/25/24 1120  BP: 109/75  Pulse: 89  Resp: 14  Temp: 98.5 F (36.9 C)  SpO2: 98%

## 2024-03-25 NOTE — ED Triage Notes (Signed)
 Per Danie Duran #[id number]   Patient states she has been having lower abdominal pain that radiates into the  back. Patient states she has abdominal cramping and once the cramping goes away I am able to have a BM.  Patient also c/o chest pressure and states she has been coughing occasionally x 1 week  Patient denies any dysuria or urinary frequency, but states she has been taking Cystex because she thought she might have a UTI.

## 2024-03-25 NOTE — ED Provider Notes (Signed)
 [facility]    CSN: [id number] Arrival date & time: 03/25/24  1014      History   Chief Complaint Chief Complaint  Patient presents with   Abdominal Pain    HPI Alicia Miller is a 48 y.o. female.  Medical interpretor used for encounter 4 day history of lower abdominal pain Located across the entire low belly. Seems worse RLQ. Nausea but no vomiting Last BM today was soft. Seems pain improved a little after BM. Normal urination. No dysuria, hematuria, flank pain. History of kidney stone with worse pain than what she is experiencing today. Took ibuprofen  2 days ago that seemed to help Also has been using Cystex in case it was a UTI  History of uterine fibroid and ovarian cyst Cervical cancer  Heavier cycles more recently. 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   Noted on EMG Dec 2020   No pertinent past medical history    Varicose veins of left leg with edema     Patient Active Problem List   Diagnosis Date Noted   Prediabetes 09/03/2023   Elevated blood pressure reading without diagnosis of hypertension 01/07/2023   Tension headache 08/30/2021   Stress 08/30/2021   Vasculitis (HCC) 12/17/2019   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   Mood changes 07/28/2019   Dyspareunia in female 07/28/2019   Menorrhagia with regular cycle 07/28/2019   S/P cholecystectomy 07/28/2019   Nevus, non-neoplastic 08/06/2017   Dermatofibroma 08/06/2017   Seborrheic keratoses 06/12/2017   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 pylori infection 04/18/2014    Past Surgical History:  Procedure Laterality Date   CHOLECYSTECTOMY  05/2015   ENDOVENOUS ABLATION SAPHENOUS VEIN W/ LASER Left 12/08/2017   endovenous laser ablation left greater saphenous vein and stab phlebectomy 10-20 incisions left leg by Merced Stair MD     OB History     Gravida  2   Para  2   Term  2   Preterm  0   AB  0   Living  2      SAB  0   IAB  0   Ectopic      Multiple  0   Live Births  2            Home Medications    Prior to Admission medications   Medication Sig Start Date End Date Taking? Authorizing Provider  cetirizine  (ZYRTEC ) 10 MG tablet Take 1 tablet (10 mg total) by  mouth daily. 09/03/23   Fleming, Zelda W, NP  diclofenac  Sodium (VOLTAREN ) 1 % GEL Apply 4 g topically 4 (four) times daily. 03/03/24 04/02/24  Fleming, Zelda W, NP  fluticasone  (FLONASE ) 50 MCG/ACT nasal spray Place 2 sprays into both nostrils daily. 09/03/23   Collins Dean, NP  tamsulosin  (FLOMAX ) 0.4 MG CAPS capsule Take 1 capsule (0.4 mg total) by mouth daily after breakfast. 12/10/23   Collins Dean, NP    Family History Family History  Problem Relation Age of Onset   Diabetes Father    Uterine cancer Sister    Diabetes Sister    Breast cancer Paternal Aunt        @ unknown age   Diabetes Paternal Uncle    Diabetes Paternal Uncle    Diabetes Paternal Uncle    Diabetes  Paternal Uncle    Diabetes Paternal Uncle    Diabetes Paternal Uncle    Diabetes Paternal Grandmother     Social History Social History   Tobacco Use   Smoking status: Never   Smokeless tobacco: Never  Vaping Use   Vaping status: Never Used  Substance Use Topics   Alcohol use: No   Drug use: No     Allergies   Bactrim  [sulfamethoxazole -trimethoprim ]   Review of Systems Review of Systems  Gastrointestinal:  Positive for abdominal pain.   Per HPI  Physical Exam Triage Vital Signs ED Triage Vitals  Encounter Vitals Group     BP 03/25/24 1120 109/75     Systolic BP Percentile --      Diastolic BP Percentile --      Pulse Rate 03/25/24 1120 89     Resp 03/25/24 1120 14     Temp 03/25/24 1120 98.5 F (36.9 C)     Temp Source 03/25/24 1120 Oral     SpO2 03/25/24 1120 98 %     Weight --      Height --      Head Circumference --      Peak Flow --      Pain Score 03/25/24 1127 4     Pain Loc --      Pain Education --      Exclude from Growth Chart --    No data found.  Updated Vital Signs BP 109/75 (BP Location: Left Arm)   Pulse 89   Temp 98.5 F (36.9 C) (Oral)   Resp 14   LMP 03/11/2024 (Exact Date)   SpO2 98%    Physical Exam Vitals and nursing note reviewed.  Constitutional:      Appearance: Normal appearance.  HENT:     Mouth/Throat:     Mouth: Mucous membranes are moist.     Pharynx: Oropharynx is clear.  Eyes:     Conjunctiva/sclera: Conjunctivae normal.  Cardiovascular:     Rate and Rhythm: Normal rate and regular rhythm.     Heart sounds: Normal heart sounds.  Pulmonary:     Effort: Pulmonary effort is normal. No respiratory distress.     Breath sounds: Normal breath sounds.  Abdominal:     General: Bowel sounds are normal.     Palpations: Abdomen is soft.     Tenderness: There is abdominal tenderness in the right upper quadrant, epigastric area and left upper quadrant. There is no right CVA tenderness, left CVA tenderness, guarding  or rebound.     Comments: Tender low abdomen with worst pain RLQ. No guarding  Musculoskeletal:  General: Normal range of motion.  Skin:    General: Skin is warm and dry.  Neurological:     Mental Status: She is alert and oriented to person, place, and time.      UC Treatments / Results  Labs (all labs ordered are listed, but only abnormal results are displayed) Labs Reviewed  POCT URINALYSIS DIP (MANUAL ENTRY) - Abnormal; Notable for the following components:      Result Value   Ketones, POC UA trace (5) (*)    Spec Grav, UA >=1.030 (*)    Blood, UA small (*)    All other components within normal limits  URINE CULTURE    EKG   Radiology No results found.  Procedures Procedures (including critical care time)  Medications Ordered in UC Medications  ketorolac  (TORADOL ) 30 MG/ML injection 30 mg (30 mg Intramuscular Given 03/25/24 1308)    Initial Impression / Assessment and Plan / UC Course  I have reviewed the triage vital signs and the nursing notes.  Pertinent labs & imaging results that were available during my care of the patient were reviewed by me and considered in my medical decision making (see chart for details).  UA trace ketones and small blood, elevated spec grav Culture is pending to rule out infection Kidney stones in past with worse pain than this. No flank pain or CVA tenderness.  With pain worse in the RLQ on palpation I have discussed with patient possible etiologies, and that some could be life threatening. Concern for uterine fibroid, ruptured or hemorrhagic cyst, metastasis from cervical cancer, or appendicitis vs ruptured appendix. I have strongly recommended patient be evaluated in the emergency department for higher level of care and advancing imaging not available in UC setting. Patient reports a bad experience in the ED and she is not willing to go. I have offered her toradol  IM; discussed if medication does not improve pain she will need  to be seen in the ED  On reassessment after pain medicine there is some improvement in overall pain, but RLQ pain remains the same and she has grimace with palpation.   I have again strongly recommended emergency department evaluation. Discussion with patient I cannot determine the cause of her pain in the urgent care, and she could have an emergent etiology.  Patient verbalizes understanding and states she will go to the ED now  Final Clinical Impressions(s) / UC Diagnoses   Final diagnoses:  RLQ abdominal pain   Discharge Instructions   None    ED Prescriptions   None    PDMP not reviewed this encounter.   Camrynn Mcclintic, Ivette Marks, [place] 03/25/24 1357

## 2024-03-25 NOTE — ED Provider Notes (Signed)
 [facility] EMERG[facility]Y DEPARTMENT AT [facility] Provider Note   CSN: [id number] Arrival date & time: 03/25/24  1403     History  Chief Complaint  Patient presents with   Abdominal Pain   Nausea    Alicia Miller is a 48 y.o. female.  48 year old female presents today for concern of lower abdominal pain.  Ongoing for 4 days.  Associated with nausea but no vomiting.  She went to urgent care and referred to the emergency department.  No prior history of abdominal surgery.  Accompanied by son.  The history is provided by the patient. A language interpreter was used.       Home Medications Prior to Admission medications   Medication Sig Start Date End Date Taking? Authorizing Provider  amoxicillin -clavulanate (AUGMENTIN ) 875-125 MG tablet Take 1 tablet by mouth every 12 (twelve) hours. 03/25/24  Yes Jigar Zielke, PA-C  ondansetron  (ZOFRAN -ODT) 4 MG disintegrating tablet Take 1 tablet (4 mg total) by mouth every 8 (eight) hours as needed. 03/25/24  Yes Ismael Treptow, PA-C  oxyCODONE  (ROXICODONE ) 5 MG immediate release tablet Take 1 tablet (5 mg total) by mouth every 4 (four) hours as needed for severe pain (pain score 7-10). 03/25/24  Yes Natahlia Hoggard, PA-C  cetirizine  (ZYRTEC ) 10 MG tablet Take 1 tablet (10 mg total) by mouth daily. 09/03/23   Fleming, Zelda W, NP  diclofenac  Sodium (VOLTAREN ) 1 % GEL Apply 4 g topically 4 (four) times daily. 03/03/24 04/02/24  Fleming, Zelda W, NP  fluticasone  (FLONASE ) 50 MCG/ACT nasal spray Place 2 sprays into both nostrils daily. 09/03/23   Fleming, Zelda W, NP  tamsulosin  (FLOMAX ) 0.4 MG CAPS capsule Take 1 capsule (0.4 mg total) by mouth daily after breakfast. 12/10/23   Fleming, Zelda W, NP      Allergies    Bactrim  [sulfamethoxazole -trimethoprim ]    Review of Systems   Review of Systems  Constitutional:  Negative for activity change, chills and fever.  Gastrointestinal:  Positive for abdominal pain and nausea. Negative for abdominal  distention and vomiting.  Genitourinary:  Negative for dysuria.  Neurological:  Negative for weakness and light-headedness.  All other systems reviewed and are negative.   Physical Exam Updated Vital Signs BP 121/77 (BP Location: Right Arm)   Pulse 76   Temp 98.1 F (36.7 C) (Oral)   Resp 18   Ht 5\' 2"  (1.575 m)   Wt 69.9 kg   LMP 03/11/2024 (Exact Date)   SpO2 98%   BMI 28.17 kg/m  Physical Exam Vitals and nursing note reviewed.  Constitutional:      General: She is not in acute distress.    Appearance: Normal appearance. She is not ill-appearing.  HENT:     Head: Normocephalic and atraumatic.     Nose: Nose normal.  Eyes:     Conjunctiva/sclera: Conjunctivae normal.  Pulmonary:     Effort: Pulmonary effort is normal. No respiratory distress.  Abdominal:     General: There is no distension.     Palpations: Abdomen is soft.     Tenderness: There is abdominal tenderness. There is no guarding.  Musculoskeletal:        General: No deformity. Normal range of motion.     Cervical back: Normal range of motion.  Skin:    Findings: No rash.  Neurological:     Mental Status: She is alert.     ED Results / Procedures / Treatments   Labs (all labs ordered are listed, but only  abnormal results are displayed) Labs Reviewed  COMPREHENSIVE METABOLIC PANEL WITH GFR - Abnormal; Notable for the following components:      Result Value   Glucose, Bld 109 (*)    AST 14 (*)    All other components within normal limits  CBC - Abnormal; Notable for the following components:   Platelets 466 (*)    All other components within normal limits  URINALYSIS, ROUTINE W REFLEX MICROSCOPIC - Abnormal; Notable for the following components:   Color, Urine AMBER (*)    APPearance HAZY (*)    Ketones, ur 20 (*)    Protein, ur 30 (*)    Bacteria, UA RARE (*)    All other components within normal limits  LIPASE, BLOOD  HCG, SERUM, QUALITATIVE    EKG None  Radiology CT ABDOMEN PELVIS W  CONTRAST Result Date: 03/25/2024 CLINICAL DATA:  Abdominal pain, nausea. EXAM: CT ABDOMEN AND PELVIS WITH CONTRAST TECHNIQUE: Multidetector CT imaging of the abdomen and pelvis was performed using the standard protocol following bolus administration of intravenous contrast. RADIATION DOSE REDUCTION: This exam was performed according to the departmental dose-optimization program which includes automated exposure control, adjustment of the mA and/or kV according to patient size and/or use of iterative reconstruction technique. CONTRAST:  60mL OMNIPAQUE  IOHEXOL  350 MG/ML SOLN COMPARISON:  12/17/2023 FINDINGS: Lower chest: Dependent subsegmental atelectasis in the lower lobes. Hepatobiliary: Cholecystectomy.  Otherwise unremarkable Pancreas: Unremarkable Spleen: Unremarkable Adrenals/Urinary Tract: Unremarkable Stomach/Bowel: Sigmoid diverticulosis with mild acute diverticulitis as shown on image 48 series 6. No extraluminal gas or abscess. Adjacent mesenteric edema noted. Vascular/Lymphatic: Unremarkable Reproductive: 1.9 cm mass anteriorly in the lower uterine body compatible with fibroid. Other: No supplemental non-categorized findings. Musculoskeletal: Unremarkable IMPRESSION: 1. Mild acute sigmoid diverticulitis. No extraluminal gas or abscess. 2. Dependent subsegmental atelectasis in the lower lobes. 3. Uterine fibroid. Electronically Signed   By: Freida Jes M.D.   On: 03/25/2024 18:18    Procedures Procedures    Medications Ordered in ED Medications  amoxicillin -clavulanate (AUGMENTIN ) 875-125 MG per tablet 1 tablet (has no administration in time range)  sodium chloride  0.9 % bolus 1,000 mL (0 mLs Intravenous Stopped 03/25/24 1757)  iohexol  (OMNIPAQUE ) 350 MG/ML injection 60 mL (60 mLs Intravenous Contrast Given 03/25/24 1753)    ED Course/ Medical Decision Making/ A&P                                 Medical Decision Making Amount and/or Complexity of Data Reviewed Labs:  ordered. Radiology: ordered.  Risk Prescription drug management.   Medical Decision Making / ED Course   This patient presents to the ED for concern of abdominal pain, this involves an extensive number of treatment options, and is a complaint that carries with it a high risk of complications and morbidity.  The differential diagnosis includes diverticulitis, gastroenteritis, colitis, appendicitis, cholecystitis, UTI  MDM: 48 year old female presents today for concern of abdominal pain.  Admission considered but will reevaluate after labs and imaging.  CBC without leukocytosis or anemia.  CMP with preserved renal function, normal electrolytes.  UA without evidence of UTI.  Lipase within normal, pregnancy test negative.  CT with evidence of diverticulitis without any complicating features.  Augmentin  given.  Prescribed for 7-day course.  Discharged in stable condition.  Discussed follow-up with PCP and gastroenterologist.  Patient voices understanding and is in agreement with plan.    Additional history obtained: -Additional history obtained from  urgent care note -External records from outside source obtained and reviewed including: Chart review including previous notes, labs, imaging, consultation notes   Lab Tests: -I ordered, reviewed, and interpreted labs.   The pertinent results include:   Labs Reviewed  COMPREHENSIVE METABOLIC PANEL WITH GFR - Abnormal; Notable for the following components:      Result Value   Glucose, Bld 109 (*)    AST 14 (*)    All other components within normal limits  CBC - Abnormal; Notable for the following components:   Platelets 466 (*)    All other components within normal limits  URINALYSIS, ROUTINE W REFLEX MICROSCOPIC - Abnormal; Notable for the following components:   Color, Urine AMBER (*)    APPearance HAZY (*)    Ketones, ur 20 (*)    Protein, ur 30 (*)    Bacteria, UA RARE (*)    All other components within normal limits  LIPASE,  BLOOD  HCG, SERUM, QUALITATIVE      EKG  EKG Interpretation Date/Time:    Ventricular Rate:    PR Interval:    QRS Duration:    QT Interval:    QTC Calculation:   R Axis:      Text Interpretation:           Imaging Studies ordered: I ordered imaging studies including CT abdomen pelvis with contrast I independently visualized and interpreted imaging. I agree with the radiologist interpretation   Medicines ordered and prescription drug management: Meds ordered this encounter  Medications   sodium chloride  0.9 % bolus 1,000 mL   iohexol  (OMNIPAQUE ) 350 MG/ML injection 60 mL   amoxicillin -clavulanate (AUGMENTIN ) 875-125 MG per tablet 1 tablet   amoxicillin -clavulanate (AUGMENTIN ) 875-125 MG tablet    Sig: Take 1 tablet by mouth every 12 (twelve) hours.    Dispense:  14 tablet    Refill:  0    Supervising Provider:   Early Glisson [[id number]]   oxyCODONE  (ROXICODONE ) 5 MG immediate release tablet    Sig: Take 1 tablet (5 mg total) by mouth every 4 (four) hours as needed for severe pain (pain score 7-10).    Dispense:  10 tablet    Refill:  0    Supervising Provider:   Annabell Key, BRIAN [[id number]]   ondansetron  (ZOFRAN -ODT) 4 MG disintegrating tablet    Sig: Take 1 tablet (4 mg total) by mouth every 8 (eight) hours as needed.    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  Return precaution discussed.  Patient voices understanding and is in agreement with plan.   Final Clinical Impression(s) / ED Diagnoses Final diagnoses:  Diverticulitis    Rx / DC Orders ED Discharge Orders          Ordered    amoxicillin -clavulanate (AUGMENTIN ) 875-125 MG tablet  Every 12 hours        03/25/24 2006    oxyCODONE  (ROXICODONE ) 5 MG immediate release tablet  Every 4 hours PRN        03/25/24 2014    ondansetron  (ZOFRAN -ODT) 4 MG disintegrating tablet  Every 8 hours PRN        03/25/24 2014              Lucina Sabal, PA-C 03/25/24 2022    Wynetta Heckle, MD 03/25/24 2155

## 2024-03-25 NOTE — ED Triage Notes (Signed)
 Pt came in via POV d/t lower abd for the last 4 days with some nausea. More pain felt to the RLQ, she went to UC first & they sent her here to ED for more imaging informing her that she may need to have appendix checked. Does have Hx of uterine fibroids. A/Ox4, rates her pain 2/10 was given pain meds at [facility].

## 2024-03-26 ENCOUNTER — Other Ambulatory Visit: Payer: Self-pay | Admitting: Nurse Practitioner

## 2024-03-26 ENCOUNTER — Other Ambulatory Visit: Payer: Self-pay

## 2024-03-26 LAB — URINE CULTURE: Culture: NO GROWTH

## 2024-03-26 NOTE — Telephone Encounter (Signed)
 Copied from CRM [id number]. Topic: Clinical - Medication Refill >> Mar 26, 2024  9:20 AM Jalayah J wrote: Medication: amoxicillin -clavulanate (AUGMENTIN ) 875-125 MG tablet   Has the patient contacted their pharmacy? Yes (Agent: If no, request that the patient contact the pharmacy for the refill. If patient does not wish to contact the pharmacy document the reason why and proceed with request.) (Agent: If yes, when and what did the pharmacy advise?)  This is the patient's preferred pharmacy:  [facility] [address] [place] [address] Phone: [phone] Fax: [phone]  Is this the correct pharmacy for this prescription? Yes If no, delete pharmacy and type the correct one.   Has the prescription been filled recently? No  Is the patient out of the medication? Yes  Has the patient been seen for an appointment in the last year OR does the patient have an upcoming appointment? Yes  Can we respond through MyChart? No  Agent: Please be advised that Rx refills may take up to 3 business days. We ask that you follow-up with your pharmacy.

## 2024-03-29 NOTE — Telephone Encounter (Signed)
 Requested medications are due for refill today.  no  Requested medications are on the active medications list.  yes  Last refill. 03/25/2024 #14 0 rf  Future visit scheduled.   yes  Notes to clinic.  Medication not assigned to a protocol. Rx was signed by another provider.    Requested Prescriptions  Pending Prescriptions Disp Refills   amoxicillin -clavulanate (AUGMENTIN ) 875-125 MG tablet 14 tablet 0    Sig: Take 1 tablet by mouth every 12 (twelve) hours.     Off-Protocol Failed - 03/29/2024 12:51 PM      Failed - Medication not assigned to a protocol, review manually.      Passed - Valid encounter within last 12 months    Recent Outpatient Visits           3 weeks ago Encounter for annual physical exam   [facility] [place] Collins Dean, NP   2 months ago Flank pain   [facility] [place]. [facility] Lawrance Presume, MD   3 months ago Flank pain   [facility] [place]. [facility] Collins Dean, NP   6 months ago Prediabetes   [facility] Collins Dean, NP   8 months ago Pharyngitis, unspecified etiology   [facility] [place]. [facility] [place], [place], PA-C

## 2024-04-21 ENCOUNTER — Other Ambulatory Visit: Payer: Self-pay

## 2024-04-21 ENCOUNTER — Encounter: Payer: Self-pay | Admitting: Nurse Practitioner

## 2024-04-21 ENCOUNTER — Ambulatory Visit (HOSPITAL_COMMUNITY)
Admission: RE | Admit: 2024-04-21 | Discharge: 2024-04-21 | Disposition: A | Discharge status: Home / Self Care | Payer: Self-pay | Source: Ambulatory Visit | Attending: Nurse Practitioner | Admitting: Nurse Practitioner

## 2024-04-21 ENCOUNTER — Ambulatory Visit: Payer: Self-pay | Attending: Nurse Practitioner | Admitting: Nurse Practitioner

## 2024-04-21 VITALS — BP 109/71 | HR 66 | Ht 62.0 in | Wt 148.2 lb

## 2024-04-21 DIAGNOSIS — R1032 Left lower quadrant pain: Secondary | ICD-10-CM

## 2024-04-21 DIAGNOSIS — M25512 Pain in left shoulder: Secondary | ICD-10-CM | POA: Insufficient documentation

## 2024-04-21 DIAGNOSIS — Z1211 Encounter for screening for malignant neoplasm of colon: Secondary | ICD-10-CM

## 2024-04-21 NOTE — Patient Instructions (Addendum)
  Pltanos, naranjas, meln cantalupo, meln verde, albaricoques, pomelo (algunos frutos secos, como ciruelas pasas, pasas y dtiles, tambin son ricos en potasio) Espinacas cocidas Brcoli cocido Papas Boniatos Championes Guisantes Pepinos Calabacn Calabazas Verduras de hoja verde Zumo de naranja Zumo de tomate Zumo de ciruela pasa Zumo de albaricoque Zumo de pomelo Yogur de leche (ACTIVIA) Atn Fletn Bacalao Trucha Pez roca Las legumbres ricas en potasio incluyen: [place] de [place] Frijoles pintos Frijoles rojos Soja Lentejas Frutos secos Carne y aves de corral Surveyor, minerals integral y salvaje Cereal de salvado Pan y pasta integrales    Alimentos que se deben evitar durante un brote de diverticulitis:  Alimentos ricos en fibra: . Los [employer], los frijoles, las [place], las verduras crudas y algunas frutas pueden ser difciles de digerir y podran Armed forces training and education officer colon.  Frutos secos, semillas y palomitas de maz: Aaron Aas Si bien antes se crea que causaban problemas, las investigaciones actuales no lo respaldan con firmeza. Sin embargo, Associate Professor durante un brote puede ser Ebb Goldman buena precaucin para International aid/development worker.  Alimentos fritos: . Estos pueden tener un alto contenido de grasa y contribuir al Dentist digestivo y la inflamacin.  Alimentos picantes: . Estos pueden Armed forces training and education officer tracto digestivo y empeorar los sntomas en algunas personas.  Carne roja: Aaron Aas Algunos estudios sugieren una relacin entre el consumo elevado de carne roja y un mayor riesgo de diverticulitis. Alimentos fermentados: . Como el chucrut o el kimchi, pueden contribuir a problemas digestivos. Lcteos: Margret Shepherd personas pueden experimentar intolerancia a los lcteos, especialmente durante un brote, lo que puede empeorar sntomas como hinchazn y gases.

## 2024-04-21 NOTE — Progress Notes (Signed)
 Assessment & Plan:   Kenitha was seen today for shoulder pain.  Diagnoses and all orders for this visit:  Acute pain of left shoulder -     DG Shoulder Left; Future  Colon cancer screening -     Ambulatory referral to Gastroenterology -     Fecal occult blood, imunochemical([facility])  Abdominal discomfort in left lower quadrant Food plan given to help her distinguish foods that will exacerbate diverticulosis -     CBC    Patient has been counseled on age-appropriate routine health concerns for screening and prevention. These are reviewed and up-to-date. Referrals have been placed accordingly. Immunizations are up-to-date or declined.    Subjective:   Chief Complaint  Patient presents with   Shoulder Pain    Alicia Miller 48 y.o. female presents to office today for follow up to left shoulder and hand pain.   At her last visit with me a few weeks ago she had complaints of left shoulder, elbow and hand pain. States she was told in the past that she had carpal tunnel in both hands and would need surgery however the right hand pain resolved over time but the left hand and wrist pain persisted. There is associated swelling and pain in the left wrist. Her left shoulder pain is aggravated by lying down on the affected side at night. There is currently no pain present on exam. There is palpable left elbow pain but no swelling or evidence or bursal sac.   I recommended wrist splint and voltaren  gel at that time with instructions to avoid sleeping on her left side at night for a few weeks.     Today she reports the pain in her shoulder is still present however the wrist and hand pain has resolved. She does continues to sleep on her left side at night   Currently experiencing mild LLQ abdominal pain. There is abdominal tenderness present with palpitation.  She was recently diagnosed with mild diverticulitis. She completed her antibiotics. States the pain medication she  prescribed was too strong for her so she stopped taking this.    Review of Systems  Constitutional:  Negative for fever, malaise/fatigue and weight loss.  HENT: Negative.  Negative for nosebleeds.   Eyes: Negative.  Negative for blurred vision, double vision and photophobia.  Respiratory: Negative.  Negative for cough and shortness of breath.   Cardiovascular: Negative.  Negative for chest pain, palpitations and leg swelling.  Gastrointestinal:  Positive for abdominal pain. Negative for blood in stool, constipation, diarrhea, heartburn, melena, nausea and vomiting.  Musculoskeletal:  Positive for joint pain. Negative for myalgias.  Neurological: Negative.  Negative for dizziness, focal weakness, seizures and headaches.  Psychiatric/Behavioral: Negative.  Negative for suicidal ideas.     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   Noted on EMG Dec 2020   No pertinent past medical history    Varicose veins of left leg with edema     Past Surgical History:  Procedure Laterality Date   CHOLECYSTECTOMY  05/2015   ENDOVENOUS ABLATION SAPHENOUS VEIN W/ LASER Left 12/08/2017   endovenous laser ablation left greater saphenous vein and stab  phlebectomy 10-20 incisions left leg by Merced Stair MD     Family History  Problem Relation Age of Onset   Diabetes Father    Uterine cancer Sister    Diabetes Sister    Breast cancer Paternal Aunt        @ unknown age   Diabetes Paternal Uncle    Diabetes Paternal Uncle    Diabetes Paternal Uncle    Diabetes Paternal Uncle    Diabetes Paternal Uncle    Diabetes Paternal Uncle    Diabetes Paternal Grandmother     Social History Reviewed  with no changes to be made today.   Outpatient Medications Prior to Visit  Medication Sig Dispense Refill   vitamin B-12 (CYANOCOBALAMIN) 100 MCG tablet Take 100 mcg by mouth daily.     cetirizine  (ZYRTEC ) 10 MG tablet Take 1 tablet (10 mg total) by mouth daily. (Patient not taking: Reported on 04/21/2024) 30 tablet 11   fluticasone  (FLONASE ) 50 MCG/ACT nasal spray Place 2 sprays into both nostrils daily. (Patient not taking: Reported on 04/21/2024) 16 g 6   amoxicillin -clavulanate (AUGMENTIN ) 875-125 MG tablet Take 1 tablet by mouth every 12 (twelve) hours. (Patient not taking: Reported on 04/21/2024) 14 tablet 0   ondansetron  (ZOFRAN -ODT) 4 MG disintegrating tablet Take 1 tablet (4 mg total) by mouth every 8 (eight) hours as needed. (Patient not taking: Reported on 04/21/2024) 20 tablet 0   oxyCODONE  (ROXICODONE ) 5 MG immediate release tablet Take 1 tablet (5 mg total) by mouth every 4 (four) hours as needed for severe pain (pain score 7-10). (Patient not taking: Reported on 04/21/2024) 10 tablet 0   tamsulosin  (FLOMAX ) 0.4 MG CAPS capsule Take 1 capsule (0.4 mg total) by mouth daily after breakfast. (Patient not taking: Reported on 04/21/2024) 30 capsule 0   No facility-administered medications prior to visit.    Allergies  Allergen Reactions   Bactrim  [Sulfamethoxazole -Trimethoprim ] Itching       Objective:    BP 109/71 (BP Location: Left Arm, Patient Position: Sitting, Cuff Size: Normal)   Pulse 66   Ht 5\' 2"  (1.575 m)   Wt 148 lb 3.2 oz (67.2 kg)   LMP 03/11/2024 (Exact Date)   BMI 27.11 kg/m  Wt Readings from Last 3 Encounters:  04/21/24 148 lb 3.2 oz (67.2 kg)  03/25/24 154 lb (69.9 kg)  03/03/24 151 lb (68.5 kg)    Physical Exam Vitals and nursing note reviewed.  Constitutional:      Appearance: She is well-developed.  HENT:     Head: Normocephalic and atraumatic.  Cardiovascular:     Rate and Rhythm: Normal rate and regular rhythm.     Heart sounds: Normal heart sounds. No  murmur heard.    No friction rub. No gallop.  Pulmonary:     Effort: Pulmonary effort is normal. No tachypnea or respiratory distress.     Breath sounds: Normal breath sounds. No decreased breath sounds, wheezing, rhonchi or rales.  Chest:     Chest wall: No tenderness.  Abdominal:     General: Bowel sounds are normal. There is no distension.     Palpations: Abdomen is soft.     Tenderness: There is abdominal tenderness. There is no guarding.  Musculoskeletal:     Left shoulder: Tenderness present. Decreased range of motion.     Cervical back: Normal range of motion.  Skin:    General: Skin is warm and dry.  Neurological:     Mental Status: She is alert and  oriented to person, place, and time.     Coordination: Coordination normal.  Psychiatric:        Behavior: Behavior normal. Behavior is cooperative.        Thought Content: Thought content normal.        Judgment: Judgment normal.          Patient has been counseled extensively about nutrition and exercise as well as the importance of adherence with medications and regular follow-up. The patient was given clear instructions to go to ER or return to medical center if symptoms don't improve, worsen or new problems develop. The patient verbalized understanding.   Follow-up: Return if symptoms worsen or fail to improve.   Collins Dean, FNP-BC [facility] [place], [place] [phone]   04/21/2024, 12:11 PM

## 2024-04-22 ENCOUNTER — Ambulatory Visit: Payer: Self-pay | Admitting: Nurse Practitioner

## 2024-04-22 LAB — CBC
Hematocrit: 42.1 % (ref 34.0–46.6)
Hemoglobin: 13.7 g/dL (ref 11.1–15.9)
MCH: 27.1 pg (ref 26.6–33.0)
MCHC: 32.5 g/dL (ref 31.5–35.7)
MCV: 83 fL (ref 79–97)
Platelets: 392 10*3/uL (ref 150–450)
RBC: 5.06 x10E6/uL (ref 3.77–5.28)
RDW: 13 % (ref 11.7–15.4)
WBC: 7.3 10*3/uL (ref 3.4–10.8)

## 2024-04-24 LAB — FECAL OCCULT BLOOD, IMMUNOCHEMICAL: Fecal Occult Bld: NEGATIVE

## 2024-04-26 ENCOUNTER — Other Ambulatory Visit: Payer: Self-pay | Admitting: Nurse Practitioner

## 2024-04-26 ENCOUNTER — Other Ambulatory Visit: Payer: Self-pay

## 2024-04-26 DIAGNOSIS — M25512 Pain in left shoulder: Secondary | ICD-10-CM

## 2024-04-26 MED ORDER — MELOXICAM 7.5 MG PO TABS
7.5000 mg | ORAL_TABLET | Freq: Every day | ORAL | 0 refills | Status: DC
  Filled 2024-04-26: qty 30, 30d supply, fill #0

## 2024-04-29 ENCOUNTER — Other Ambulatory Visit: Payer: Self-pay

## 2024-05-06 ENCOUNTER — Other Ambulatory Visit: Payer: Self-pay

## 2024-05-06 DIAGNOSIS — Z87898 Personal history of other specified conditions: Secondary | ICD-10-CM

## 2024-06-01 NOTE — Progress Notes (Unsigned)
 Ellouise Console, PA-C [address] [place], [place]  [address] Phone: [phone]   Gastroenterology Consultation  Referring Provider:     Theotis Haze ORN, NP Primary Care Physician:  Theotis Haze ORN, NP Primary Gastroenterologist:  Ellouise Console, PA-C / Elspeth Naval, MD  Reason for Consultation:     ED follow-up for diverticulitis        HPI:   Alicia Miller is a 48 y.o. y/o female referred for consultation & management  by Theotis Haze ORN, NP.  Spanish language interpreter was used today through [employer].  Patient went to [facility] ED 03/25/2024 with 4-day history of lower abdominal pain.  Nausea but no vomiting.  Abdominal pelvic CT showed mild acute sigmoid diverticulitis.  No perforation or abscess.  Uterine fibroid.  No other abnormality.  Treated with Augmentin  x 7 days.  Told to follow-up with GI.  Current symptoms: Left lower quadrant pain has resolved.  She is feeling a lot better.  She finished all Augmentin  antibiotic.  No current abdominal pain.  She admits to mild constipation.  Denies rectal bleeding or weight loss.  No previous colonoscopy. No family history of colon cancer.  Patient saw Dr. Missie through [facility] in 2015 to evaluate upper abdominal pain.    05/2014 EGD by Dr. Missie (for epigastric pain): Mild gastritis, otherwise normal esophagus and duodenum.  Continued on PPI.  No biopsies.  PMH: Cervical cancer, cholelithiasis s/p cholecystectomy, GERD, history of H. Pylori.  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   Noted on EMG Dec 2020   No pertinent past medical history    Varicose  veins of left leg with edema     Past Surgical History:  Procedure Laterality Date   CHOLECYSTECTOMY  05/2015   ENDOVENOUS ABLATION SAPHENOUS VEIN W/ LASER Left 12/08/2017   endovenous laser ablation left greater saphenous vein and stab phlebectomy 10-20 incisions left leg by Lynwood Collum MD     Prior to Admission medications   Medication Sig Start Date End Date Taking? Authorizing Provider  cetirizine  (ZYRTEC ) 10 MG tablet Take 1 tablet (10 mg total) by mouth daily. Patient not taking: Reported on 04/21/2024 09/03/23   Fleming, Zelda W, NP  fluticasone  (FLONASE ) 50 MCG/ACT nasal spray Place 2 sprays into both nostrils daily. Patient not taking: Reported on 04/21/2024 09/03/23   Fleming, Zelda W, NP  meloxicam  (MOBIC ) 7.5 MG tablet Take 1 tablet (7.5 mg total) by mouth daily FOR SHOULDER/JOINT PAIN. 04/26/24   Fleming, Zelda W, NP  vitamin B-12 (CYANOCOBALAMIN) 100 MCG tablet Take 100 mcg by mouth daily.    [provider]    Family History  Problem Relation Age of Onset   Diabetes Father    Uterine cancer Sister    Diabetes Sister    Diabetes Paternal Grandmother    Breast cancer Paternal Aunt        @ unknown age   Diabetes Paternal Uncle    Diabetes Paternal Uncle    Diabetes Paternal Uncle    Diabetes Paternal Uncle    Diabetes Paternal Uncle  Diabetes Paternal Uncle    Liver disease Neg Hx    Esophageal cancer Neg Hx    Colon cancer Neg Hx      Social History   Tobacco Use   Smoking status: Never   Smokeless tobacco: Never  Vaping Use   Vaping status: Never Used  Substance Use Topics   Alcohol use: No   Drug use: No    Allergies as of 06/02/2024 - Review Complete 06/02/2024  Allergen Reaction Noted   Bactrim  [sulfamethoxazole -trimethoprim ] Itching 12/10/2016    Review of Systems:    All systems reviewed and negative except where noted in HPI.   Physical Exam:  BP 118/68   Pulse 70   Ht 5' 2 (1.575 m)   Wt 147 lb (66.7 kg)   BMI 26.89 kg/m   No LMP recorded.  General:   Alert,  Well-developed, well-nourished, pleasant and cooperative in NAD Lungs:  Respirations even and unlabored.  Clear throughout to auscultation.   No wheezes, crackles, or rhonchi. No acute distress. Heart:  Regular rate and rhythm; no murmurs, clicks, rubs, or gallops. Abdomen:  Normal bowel sounds.  No bruits.  Soft, and non-distended without masses, hepatosplenomegaly or hernias noted.  No Tenderness.  No guarding or rebound tenderness.    Rectal: Deferred until time of colonoscopy. Neurologic:  Alert and oriented x3;  grossly normal neurologically. Psych:  Alert and cooperative. Normal mood and affect.  Imaging Studies: No results found.  Labs: CBC    Component Value Date/Time   WBC 7.3 04/21/2024 0945   WBC 8.7 03/25/2024 1516   RBC 5.06 04/21/2024 0945   RBC 4.84 03/25/2024 1516   HGB 13.7 04/21/2024 0945   HCT 42.1 04/21/2024 0945   PLT 392 04/21/2024 0945   MCV 83 04/21/2024 0945   MCH 27.1 04/21/2024 0945   MCH 27.5 03/25/2024 1516   MCHC 32.5 04/21/2024 0945   MCHC 32.9 03/25/2024 1516   RDW 13.0 04/21/2024 0945   LYMPHSABS 2.2 03/03/2024 1004   MONOABS 0.5 01/15/2021 1056   EOSABS 0.6 (H) 03/03/2024 1004   BASOSABS 0.1 03/03/2024 1004    CMP     Component Value Date/Time   NA 136 03/25/2024 1516   NA 137 03/03/2024 1004   K 3.5 03/25/2024 1516   CL 101 03/25/2024 1516   CO2 25 03/25/2024 1516   GLUCOSE 109 (H) 03/25/2024 1516   BUN 11 03/25/2024 1516   BUN 11 03/03/2024 1004   CREATININE 0.62 03/25/2024 1516   CREATININE 0.70 11/07/2016 1050   CALCIUM  9.1 03/25/2024 1516   PROT 7.3 03/25/2024 1516   PROT 7.3 03/03/2024 1004   ALBUMIN 3.6 03/25/2024 1516   ALBUMIN 4.5 03/03/2024 1004   AST 14 (L) 03/25/2024 1516   ALT 14 03/25/2024 1516   ALKPHOS 40 03/25/2024 1516   BILITOT 0.7 03/25/2024 1516   BILITOT 0.4 03/03/2024 1004   GFRNONAA >60 03/25/2024 1516   GFRNONAA >89 11/07/2016 1050   GFRAA 128 09/08/2020 1137    GFRAA >89 11/07/2016 1050    Assessment and Plan:   Alicia Miller is a 48 y.o. y/o female has been referred for ED follow-up of:  1.  Mild uncomplicated acute sigmoid diverticulitis.  Resolved s/p treatment with Augmentin  x 7 days.  No more abdominal pain. - Patient education discussed. - She will let us  know if she has any recurrent LLQ abdominal pain in the future.  2.  Constipation - Start OTC Miralax Powder: Mix 1 capful in  6 to 8 ounces of a drink once daily - Recommend high-fiber diet, 30 g of fiber daily - Eat fruits, vegetables, and whole grains - Drink 64 ounces of water / fluids daily.   3.  External Hemorrhoids - Rx Hydrocortisone  2.5% cream apply 2-3 times daily as needed, number 30 g, 1 refill. - Stressed the importance of treating underlying constipation.  4.  Colon cancer screening: No previous colonoscopy - Scheduling Colonoscopy I discussed risks of colonoscopy with patient to include risk of bleeding, colon perforation, and risk of sedation.  Patient expressed understanding and agrees to proceed with colonoscopy.   Follow up as needed based on colonoscopy results and GI symptoms.  Ellouise Console, PA-C

## 2024-06-02 ENCOUNTER — Encounter: Payer: Self-pay | Admitting: Physician Assistant

## 2024-06-02 ENCOUNTER — Other Ambulatory Visit: Payer: Self-pay

## 2024-06-02 ENCOUNTER — Ambulatory Visit (INDEPENDENT_AMBULATORY_CARE_PROVIDER_SITE_OTHER): Payer: Self-pay | Admitting: Physician Assistant

## 2024-06-02 VITALS — BP 118/68 | HR 70 | Ht 62.0 in | Wt 147.0 lb

## 2024-06-02 DIAGNOSIS — K644 Residual hemorrhoidal skin tags: Secondary | ICD-10-CM

## 2024-06-02 DIAGNOSIS — K5900 Constipation, unspecified: Secondary | ICD-10-CM

## 2024-06-02 DIAGNOSIS — Z1211 Encounter for screening for malignant neoplasm of colon: Secondary | ICD-10-CM

## 2024-06-02 DIAGNOSIS — Z8719 Personal history of other diseases of the digestive system: Secondary | ICD-10-CM

## 2024-06-02 DIAGNOSIS — K649 Unspecified hemorrhoids: Secondary | ICD-10-CM

## 2024-06-02 MED ORDER — HYDROCORTISONE (PERIANAL) 2.5 % EX CREA
1.0000 | TOPICAL_CREAM | Freq: Two times a day (BID) | CUTANEOUS | 1 refills | Status: AC
  Filled 2024-06-02: qty 30, 10d supply, fill #0

## 2024-06-02 NOTE — Progress Notes (Signed)
 Agree with assessment and plan as outlined.

## 2024-06-02 NOTE — Patient Instructions (Addendum)
   For constipation: Start OTC Miralax Powder Mix 1 capful in 6 to 8 ounces of a drink once daily  Recommend high-fiber diet, 30 g of fiber daily Eat fruits, vegetables, and whole grains Drink 64 ounces of water / fluids daily.   We have sent the following medications to your pharmacy for you to pick up at your convenience: Hydrocortisone  Cream 2.5%  You have been scheduled for a Colonoscopy. Please follow written instructions given to you at your visit today.   If you use inhalers (even only as needed), please bring them with you on the day of your procedure.  DO NOT TAKE 7 DAYS PRIOR TO TEST- Trulicity (dulaglutide) Ozempic, Wegovy (semaglutide) Mounjaro (tirzepatide) Bydureon Bcise (exanatide extended release)  DO NOT TAKE 1 DAY PRIOR TO YOUR TEST Rybelsus (semaglutide) Adlyxin (lixisenatide) Victoza (liraglutide) Byetta (exanatide) ___________________________________________________________________________  Please follow up sooner if symptoms increase or worsen   Due to recent changes in healthcare laws, you may see the results of your imaging and laboratory studies on MyChart before your provider has had a chance to review them.  We understand that in some cases there may be results that are confusing or concerning to you. Not all laboratory results come back in the same time frame and the provider may be waiting for multiple results in order to interpret others.  Please give us  48 hours in order for your provider to thoroughly review all the results before contacting the office for clarification of your results.   Thank you for trusting me with your gastrointestinal care!   Ellouise Console, PA-C _______________________________________________________  If your blood pressure at your visit was 140/90 or greater, please contact your primary care physician to follow up on this.  _______________________________________________________  If you are age 39 or older, your body mass  index should be between 23-30. Your Body mass index is 26.89 kg/m. If this is out of the aforementioned range listed, please consider follow up with your Primary Care Provider.  If you are age 53 or younger, your body mass index should be between 19-25. Your Body mass index is 26.89 kg/m. If this is out of the aformentioned range listed, please consider follow up with your Primary Care Provider.   ________________________________________________________  The [facility] providers would like to encourage you to use MYCHART to communicate with providers for non-urgent requests or questions.  Due to long hold times on the telephone, sending your provider a message by [facility] may be a faster and more efficient way to get a response.  Please allow 48 business hours for a response.  Please remember that this is for non-urgent requests.  _______________________________________________________

## 2024-06-03 ENCOUNTER — Other Ambulatory Visit: Payer: Self-pay

## 2024-06-08 ENCOUNTER — Ambulatory Visit (HOSPITAL_COMMUNITY)
Admission: EM | Admit: 2024-06-08 | Discharge: 2024-06-08 | Disposition: A | Discharge status: Home / Self Care | Payer: Self-pay | Attending: Emergency Medicine | Admitting: Emergency Medicine

## 2024-06-08 ENCOUNTER — Encounter (HOSPITAL_COMMUNITY): Payer: Self-pay

## 2024-06-08 ENCOUNTER — Other Ambulatory Visit: Payer: Self-pay

## 2024-06-08 DIAGNOSIS — M79652 Pain in left thigh: Secondary | ICD-10-CM

## 2024-06-08 DIAGNOSIS — W57XXXA Bitten or stung by nonvenomous insect and other nonvenomous arthropods, initial encounter: Secondary | ICD-10-CM

## 2024-06-08 MED ORDER — CEPHALEXIN 500 MG PO CAPS
500.0000 mg | ORAL_CAPSULE | Freq: Four times a day (QID) | ORAL | 0 refills | Status: AC
  Filled 2024-06-08: qty 20, 5d supply, fill #0

## 2024-06-08 MED ORDER — HYDROXYZINE HCL 25 MG PO TABS
25.0000 mg | ORAL_TABLET | Freq: Four times a day (QID) | ORAL | 0 refills | Status: AC | PRN
  Filled 2024-06-08: qty 12, 3d supply, fill #0

## 2024-06-08 NOTE — Discharge Instructions (Addendum)
 Make sure to avoid early morning,late evening as insect activity is at peak. Use bug spray when outside, long sleeves/pants, use meds(Keflex  and Atarax ) as prescribed. May use over the counter calamine lotion topically to area as label directed,  wash hands frequently, trim nails,avoid scratching. Apply ice to area. If you develop fever, streaking, worsening issues please follow up immediately with PCP or go to ER.

## 2024-06-08 NOTE — ED Triage Notes (Signed)
 Per interpreter, pt states bite by an ant to lt upper thigh last night. C/o redness, heat, and itchy to area. States took allergy meds today.

## 2024-06-08 NOTE — ED Provider Notes (Signed)
 [facility]    CSN: [id number] Arrival date & time: 06/08/24  1213      History   Chief Complaint Chief Complaint  Patient presents with   Insect Bite    HPI Alicia Miller is a 48 y.o. female.   48 year old female, Alicia Miller, presents to urgent care for evaluation of ant bite to the left upper thigh that occurred last night.  Patient complains of redness, itchy ,warm to touch to the area of left thigh.  Patient states she took allergy meds this morning and used some ice to area.  The history is provided by the patient. A language interpreter was used.    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   Noted on EMG Dec 2020   No pertinent past medical history    Varicose veins of left leg with edema     Patient Active Problem List   Diagnosis Date Noted   Infected insect bite 06/08/2024   Left thigh pain 06/08/2024   Prediabetes 09/03/2023   Elevated blood pressure reading without diagnosis of hypertension 01/07/2023   Tension headache 08/30/2021   Stress 08/30/2021   Vasculitis (HCC) 12/17/2019   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   Mood changes 07/28/2019   Dyspareunia in female 07/28/2019   Menorrhagia with regular cycle 07/28/2019   S/P cholecystectomy 07/28/2019   Nevus, non-neoplastic 08/06/2017   Dermatofibroma 08/06/2017   Seborrheic keratoses 06/12/2017   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 pylori infection 04/18/2014    Past Surgical History:  Procedure Laterality Date   CHOLECYSTECTOMY  05/2015   ENDOVENOUS ABLATION SAPHENOUS VEIN W/ LASER Left 12/08/2017   endovenous laser ablation left greater saphenous vein and stab phlebectomy 10-20 incisions left leg by Lynwood Collum MD     OB History     Gravida  2   Para  2   Term  2   Preterm  0   AB  0   Living  2      SAB  0   IAB  0   Ectopic      Multiple  0   Live Births  2            Home Medications    Prior to Admission medications   Medication Sig Start Date End Date Taking? Authorizing Provider  cephALEXin  (KEFLEX ) 500 MG capsule Take 1 capsule (500 mg total) by mouth 4 (four) times daily for 5 days. 06/08/24 06/13/24 Yes Sharada Albornoz, Rilla, NP  hydrOXYzine  (ATARAX ) 25 MG tablet Take 1 tablet (25 mg total)  by mouth every 6 (six) hours as needed for itching. 06/08/24  Yes Valeriano Bain, Rilla, NP  cetirizine  (ZYRTEC ) 10 MG tablet Take 1 tablet (10 mg total) by mouth daily. Patient not taking: Reported on 04/21/2024 09/03/23   Fleming, Zelda W, NP  fluticasone  (FLONASE ) 50 MCG/ACT nasal spray Place 2 sprays into both nostrils daily. Patient not taking: Reported on 04/21/2024 09/03/23   Fleming, Zelda W, NP  hydrocortisone  (ANUSOL -HC) 2.5 % rectal cream Place 1 Application rectally 2 (two) times daily. 06/02/24   [place], PA-C  vitamin B-12 (CYANOCOBALAMIN) 100 MCG tablet Take 100 mcg by mouth daily.    [provider]    Family History Family History  Problem Relation Age of Onset   Diabetes Father    Uterine  cancer Sister    Diabetes Sister    Diabetes Paternal Grandmother    Breast cancer Paternal Aunt        @ unknown age   Diabetes Paternal Uncle    Diabetes Paternal Uncle    Diabetes Paternal Uncle    Diabetes Paternal Uncle    Diabetes Paternal Uncle    Diabetes Paternal Uncle    Liver disease Neg Hx    Esophageal cancer Neg Hx    Colon cancer Neg Hx     Social History Social History   Tobacco Use   Smoking status: Never   Smokeless tobacco: Never  Vaping Use   Vaping status: Never Used  Substance Use Topics   Alcohol use: No   Drug use: No     Allergies   Bactrim  [sulfamethoxazole -trimethoprim ]   Review of Systems Review of Systems  Constitutional:  Negative for fever.  Gastrointestinal:  Negative for abdominal pain, nausea and vomiting.  Musculoskeletal:  Negative for myalgias.  Skin:  Positive for color change and wound.  All other systems reviewed and are negative.    Physical Exam Triage Vital Signs ED Triage Vitals  Encounter Vitals Group     BP      Girls Systolic BP Percentile      Girls Diastolic BP Percentile      Boys Systolic BP Percentile      Boys Diastolic BP Percentile      Pulse      Resp      Temp      Temp src      SpO2      Weight      Height      Head Circumference      Peak Flow      Pain Score      Pain Loc      Pain Education      Exclude from Growth Chart    No data found.  Updated Vital Signs BP 125/82 (BP Location: Right Arm)   Pulse (!) 103   Temp 98.3 F (36.8 C) (Oral)   Resp 16   LMP 06/02/2024 (Exact Date)   SpO2 96%   Visual Acuity Right Eye Distance:   Left Eye Distance:   Bilateral Distance:    Right Eye Near:   Left Eye Near:    Bilateral Near:     Physical Exam Vitals and nursing note reviewed.  Constitutional:      Appearance: Normal appearance. She is well-developed and well-groomed.  Cardiovascular:     Rate and Rhythm: Normal rate.  Pulmonary:     Effort: Pulmonary effort is normal.   Skin:    General: Skin is warm.  Findings: Lesion and wound present.  Neurological:     General: No focal deficit present.     Mental Status: She is alert and oriented to person, place, and time.     GCS: GCS eye subscore is 4. GCS verbal subscore is 5. GCS motor subscore is 6.  Psychiatric:        Attention and Perception: Attention normal.        Mood and Affect: Mood normal.        Speech: Speech normal.        Behavior: Behavior is cooperative.      UC Treatments / Results  Labs (all labs ordered are listed, but only abnormal results are displayed) Labs Reviewed - No data to display  EKG   Radiology No results found.  Procedures Procedures (including critical care time)  Medications Ordered in UC Medications - No data to display  Initial Impression / Assessment and Plan / UC Course  I have reviewed the triage vital signs and the nursing notes.  Pertinent labs & imaging results that were available during my care of the patient were reviewed by me and considered in my medical decision making (see chart for details).    Discussed exam findings and plan of care with patient, keflex /atarax  scripted, strict go to ER precautions given.   Patient verbalized understanding to this provider via translator.  Ddx: Bug bite, cellulitis, left thigh pain Final Clinical Impressions(s) / UC Diagnoses   Final diagnoses:  Bug bite with infection, initial encounter  Left thigh pain     Discharge Instructions      Make sure to avoid early morning,late evening as insect activity is at peak. Use bug spray when outside, long sleeves/pants, use meds(Keflex  and Atarax ) as prescribed. May use over the counter calamine lotion topically to area as label directed,  wash hands frequently, trim nails,avoid scratching. Apply ice to area. If you develop fever, streaking, worsening issues please follow up immediately with PCP or go to ER.      ED Prescriptions     Medication Sig  Dispense Auth. Provider   cephALEXin  (KEFLEX ) 500 MG capsule Take 1 capsule (500 mg total) by mouth 4 (four) times daily for 5 days. 20 capsule Asheton Viramontes, NP   hydrOXYzine  (ATARAX ) 25 MG tablet Take 1 tablet (25 mg total) by mouth every 6 (six) hours as needed for itching. 12 tablet Zainab Crumrine, Rilla, NP      PDMP not reviewed this encounter.   Aminta Rilla, NP 06/08/24 1727

## 2024-06-11 ENCOUNTER — Other Ambulatory Visit: Payer: Self-pay

## 2024-07-16 ENCOUNTER — Telehealth: Payer: Self-pay | Admitting: Gastroenterology

## 2024-07-16 ENCOUNTER — Other Ambulatory Visit: Payer: Self-pay

## 2024-07-16 DIAGNOSIS — Z1211 Encounter for screening for malignant neoplasm of colon: Secondary | ICD-10-CM

## 2024-07-16 MED ORDER — PEG 3350-KCL-NA BICARB-NACL 420 G PO SOLR
4000.0000 mL | Freq: Once | ORAL | 0 refills | Status: AC
  Filled 2024-07-16: qty 4000, 1d supply, fill #0

## 2024-07-16 NOTE — Telephone Encounter (Signed)
 Inbound call from patient stating she would like to speak to someone in regards to procedure on 10/16 to see if her procedure would be covered by [place] service area. [facility] Requesting a call back  Please advise  Thank you

## 2024-07-16 NOTE — Telephone Encounter (Signed)
 Inbound call from patient stating she has has an upcoming procedure on 10/16 due to rescheduling but patient states she was never sent the prep medication to her pharmacy   Please advise  Thank you

## 2024-07-16 NOTE — Telephone Encounter (Signed)
 Attempted to return patient call.  No asnwer. Sent prep to pharmacy on file.

## 2024-07-22 ENCOUNTER — Other Ambulatory Visit: Payer: Self-pay

## 2024-07-22 ENCOUNTER — Telehealth: Payer: Self-pay | Admitting: Physician Assistant

## 2024-07-22 ENCOUNTER — Encounter: Payer: Self-pay | Admitting: Gastroenterology

## 2024-07-22 NOTE — Telephone Encounter (Signed)
 Inbound call from patient stating she was last seen 7/16 and she was taking antibiotics but now she has been experiencing the same pain and wants to know if she has to be seen in office again or can she be prescribed the antibiotics. Requesting a call back Please advise  Thank you

## 2024-07-22 NOTE — Telephone Encounter (Signed)
 Spoke with pt and offered her an appt for tomorrow afternoon with Ellouise but pt states she cannot come in the afternoon. Discussed [facility] eval but pt states she does not like the er. Pt scheduled to see Delon Failing PA 07/23/24@8 :40am. Pt aware of appt.

## 2024-07-22 NOTE — Telephone Encounter (Signed)
 Pt reports she started having the same pain that she had in July, it started on Saturday. She wants to know if she can be prescribed Augmentin  as she was in July. Please advise.

## 2024-07-23 ENCOUNTER — Encounter: Payer: Self-pay | Admitting: Physician Assistant

## 2024-07-23 ENCOUNTER — Ambulatory Visit (INDEPENDENT_AMBULATORY_CARE_PROVIDER_SITE_OTHER): Payer: Self-pay | Admitting: Physician Assistant

## 2024-07-23 ENCOUNTER — Ambulatory Visit: Payer: Self-pay | Admitting: Physician Assistant

## 2024-07-23 ENCOUNTER — Other Ambulatory Visit: Payer: Self-pay

## 2024-07-23 VITALS — BP 104/68 | HR 77 | Ht 62.0 in | Wt 149.4 lb

## 2024-07-23 DIAGNOSIS — R110 Nausea: Secondary | ICD-10-CM

## 2024-07-23 DIAGNOSIS — K5900 Constipation, unspecified: Secondary | ICD-10-CM

## 2024-07-23 DIAGNOSIS — Z1211 Encounter for screening for malignant neoplasm of colon: Secondary | ICD-10-CM

## 2024-07-23 DIAGNOSIS — Z8719 Personal history of other diseases of the digestive system: Secondary | ICD-10-CM

## 2024-07-23 DIAGNOSIS — R1032 Left lower quadrant pain: Secondary | ICD-10-CM

## 2024-07-23 MED ORDER — AMOXICILLIN-POT CLAVULANATE 875-125 MG PO TABS
1.0000 | ORAL_TABLET | Freq: Two times a day (BID) | ORAL | 0 refills | Status: AC
Start: 2024-07-23 — End: 2024-08-03
  Filled 2024-07-23: qty 14, 7d supply, fill #0

## 2024-07-23 NOTE — Patient Instructions (Addendum)
 Hemos enviado los siguientes medicamentos a su farmacia para que los recoja cuando le resulte conveniente: Augmentin  875 ng [employer] al da durante 7 [address]  [address].  Dieta de lquidos claros durante 2-3 das; luego, cambie a una dieta baja en fibra hasta que mejore.  Colonoscopia cancelada por ahora.   _______________________________________________________________________   We have sent the following medications to your pharmacy for you to pick up at your convenience: Augmentin  875 ng twice daily for 7 days.   Clear liquid diet for 2-3 days, then switch to low fiber diet until better.   Colonoscopy canceled for now.

## 2024-07-23 NOTE — Progress Notes (Signed)
 Chief Complaint: Abdominal pain  HPI:    [name] is a 48 year old Hispanic female, who presents to clinic today accompanied by interpreter, with a past medical history as listed below including cervical cancer, adenocarcinoma of the cervix, cholelithiasis status postcholecystectomy and history of H. pylori depression and multiple others, assigned to Dr. Leigh last visit, who presents to clinic today for follow-up of her abdominal pain.      2015 patient follows with GI at [facility] to evaluate upper abdominal pain.    05/2014 EGD by Dr. Missie for epigastric pain with mild gastritis and otherwise normal.  Continue PPI.    03/25/2024 ER visit for 4-day history of lower abdominal pain, nausea but no vomiting, CT showed mild acute sigmoid diverticulitis with no perforation or abscess.  Treated with Augmentin  x 7 days.    06/02/2024 patient seen by Alicia Console, PA-C in our clinic at that time following up after an ER visit for diverticulitis.  Follow-up feeling better.  Mild constipation.  At that time discussed mild uncomplicated acute diverticulitis resolved after treatment with Augmentin .  Started on MiraLAX for constipation.  Recommended Hydrocortisone  2.5% cream for external hemorrhoids.  Scheduled for colonoscopy.    09/02/2024 upcoming colonoscopy with Dr. Leigh.    Today, the patient tells me that a week ago, 07/17/2024 she started with left lower quadrant pain, which felt very similar to prior episode of diverticulitis, which grew more severe over the following 3 to 4 days.  Describes it was hard to even get up and walk around, she felt like she needed to have a bowel movement but when she would sit down it would be loose stool.  She tried some natural remedies including Linseed oil which helped and over the past 48 hours she feels much better with some lingering left lower quadrant discomfort , rated as a 5-6/10, and still a slight change in her normal bowel habits.  Patient tells me  yesterday she started her menstrual cycle so this has added to some of her discomfort.  Associate symptoms include nausea but no fever.    Denies fever, chills, weight loss or blood in her stool.  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   Noted on EMG Dec 2020   No pertinent past medical history    Varicose veins of left leg with edema     Past Surgical History:  Procedure Laterality Date   CHOLECYSTECTOMY  05/2015   ENDOVENOUS ABLATION SAPHENOUS VEIN W/ LASER Left 12/08/2017   endovenous laser ablation left greater saphenous vein and stab phlebectomy 10-20 incisions left leg by Alicia Collum MD     Current Outpatient Medications  Medication Sig Dispense Refill   cetirizine  (ZYRTEC ) 10 MG tablet Take 1 tablet (10 mg total) by mouth daily. (Patient not taking: Reported on 04/21/2024) 30 tablet 11   fluticasone  (FLONASE ) 50 MCG/ACT nasal spray Place 2 sprays into both nostrils daily. (Patient not taking: Reported on 04/21/2024) 16 g 6   hydrocortisone  (ANUSOL -HC) 2.5 % rectal cream Place 1 Application rectally 2 (two) times daily. 30 g 1   hydrOXYzine  (ATARAX ) 25 MG tablet Take 1  tablet (25 mg total) by mouth every 6 (six) hours as needed for itching. 12 tablet 0   vitamin B-12 (CYANOCOBALAMIN) 100 MCG tablet Take 100 mcg by mouth daily.     No current facility-administered medications for this visit.    Allergies as of 07/23/2024 - Review Complete 06/08/2024  Allergen Reaction Noted   Bactrim  [sulfamethoxazole -trimethoprim ] Itching 12/10/2016    Family History  Problem Relation Age of Onset   Diabetes Father    Uterine cancer Sister    Diabetes Sister     Diabetes Paternal Grandmother    Breast cancer Paternal Aunt        @ unknown age   Diabetes Paternal Uncle    Diabetes Paternal Uncle    Diabetes Paternal Uncle    Diabetes Paternal Uncle    Diabetes Paternal Uncle    Diabetes Paternal Uncle    Liver disease Neg Hx    Esophageal cancer Neg Hx    Colon cancer Neg Hx     Social History   Socioeconomic History   Marital status: Married    Spouse name: Not on file   Number of children: 2   Years of education: Not on file   Highest education level: 6th grade  Occupational History   Not on file  Tobacco Use   Smoking status: Never   Smokeless tobacco: Never  Vaping Use   Vaping status: Never Used  Substance and Sexual Activity   Alcohol use: No   Drug use: No   Sexual activity: Yes    Birth control/protection: None  Other Topics Concern   Not on file  Social History Narrative   Not on file   Social Drivers of Health   Financial Resource Strain: Low Risk  (09/03/2023)   Overall Financial Resource Strain (CARDIA)    Difficulty of Paying Living Expenses: Not hard at all  Food Insecurity: No Food Insecurity (09/03/2023)   Hunger Vital Sign    Worried About Running Out of Food in the Last Year: Never true    Ran Out of Food in the Last Year: Never true  Transportation Needs: No Transportation Needs (09/03/2023)   PRAPARE - Administrator, Civil Service (Medical): No    Lack of Transportation (Non-Medical): No  Physical Activity: Unknown (09/03/2023)   Exercise Vital Sign    Days of Exercise per Week: 0 days    Minutes of Exercise per Session: Not on file  Stress: No Stress Concern Present (09/03/2023)   [employer] of Occupational Health - Occupational Stress Questionnaire    Feeling of Stress : Not at all  Social Connections: Moderately Integrated (09/03/2023)   Social Connection and Isolation Panel    Frequency of Communication with Friends and Family: Once a week    Frequency of Social  Gatherings with Friends and Family: Once a week    Attends Religious Services: 1 to 4 times per year    Active Member of [employer] or Organizations: Yes    Attends Banker Meetings: 1 to 4 times per year    Marital Status: Married  Catering manager Violence: Not At Risk (09/03/2023)   Humiliation, Afraid, Rape, and Kick questionnaire    Fear of Current or Ex-Partner: No    Emotionally Abused: No    Physically Abused: No    Sexually Abused: No    Review of Systems:    Constitutional: No weight loss, fever or chills Cardiovascular: No chest pain Respiratory: No SOB  Gastrointestinal: See  HPI and otherwise negative   Physical Exam:  Vital signs: BP 104/68   Pulse 77   Ht 5' 2 (1.575 m)   Wt 149 lb 6 oz (67.8 kg)   BMI 27.32 kg/m    Constitutional:   Pleasant Hispanic female appears to be in NAD, Well developed, Well nourished, alert and cooperative Respiratory: Respirations even and unlabored. Lungs clear to auscultation bilaterally.   No wheezes, crackles, or rhonchi.  Cardiovascular: Normal S1, S2. No MRG. Regular rate and rhythm. No peripheral edema, cyanosis or pallor.  Gastrointestinal:  Soft, nondistended, moderate-marked LLQ ttp, moderate RLQ ttp No rebound or guarding. Normal bowel sounds. No appreciable masses or hepatomegaly. Rectal:  Not performed.  Psychiatric:  Demonstrates good judgement and reason without abnormal affect or behaviors.  Most recent labs: CBC    Component Value Date/Time   WBC 7.3 04/21/2024 0945   WBC 8.7 03/25/2024 1516   RBC 5.06 04/21/2024 0945   RBC 4.84 03/25/2024 1516   HGB 13.7 04/21/2024 0945   HCT 42.1 04/21/2024 0945   PLT 392 04/21/2024 0945   MCV 83 04/21/2024 0945   MCH 27.1 04/21/2024 0945   MCH 27.5 03/25/2024 1516   MCHC 32.5 04/21/2024 0945   MCHC 32.9 03/25/2024 1516   RDW 13.0 04/21/2024 0945   LYMPHSABS 2.2 03/03/2024 1004   MONOABS 0.5 01/15/2021 1056   EOSABS 0.6 (H) 03/03/2024 1004   BASOSABS 0.1  03/03/2024 1004    CMP     Component Value Date/Time   NA 136 03/25/2024 1516   NA 137 03/03/2024 1004   K 3.5 03/25/2024 1516   CL 101 03/25/2024 1516   CO2 25 03/25/2024 1516   GLUCOSE 109 (H) 03/25/2024 1516   BUN 11 03/25/2024 1516   BUN 11 03/03/2024 1004   CREATININE 0.62 03/25/2024 1516   CREATININE 0.70 11/07/2016 1050   CALCIUM  9.1 03/25/2024 1516   PROT 7.3 03/25/2024 1516   PROT 7.3 03/03/2024 1004   ALBUMIN 3.6 03/25/2024 1516   ALBUMIN 4.5 03/03/2024 1004   AST 14 (L) 03/25/2024 1516   ALT 14 03/25/2024 1516   ALKPHOS 40 03/25/2024 1516   BILITOT 0.7 03/25/2024 1516   BILITOT 0.4 03/03/2024 1004   GFRNONAA >60 03/25/2024 1516   GFRNONAA >89 11/07/2016 1050   GFRAA 128 09/08/2020 1137   GFRAA >89 11/07/2016 1050    Assessment: 1.  Left lower quadrant abdominal pain: Similar pain in July with a CT showing acute uncomplicated sigmoid diverticulitis treated with Augmentin  x 7 days and resolved, now started with pain about 6 days ago, it grew severe and now has lessened slightly but continues to be a 5-6/10 left lower quadrant, complicated by the onset of her menstrual cycle with some generalized lower abdominal discomfort now, bowel habits changed to slightly looser; most likely recurrent diverticulitis +/- menstrual cycle discomfort +/- constipation 2.  Screening for colorectal cancer: Patient is now 61 never had screening for colon cancer  Plan: 1.  Patient originally scheduled for a colonoscopy in October.  We will cancel this.  Explained that we need to wait 6 to 8 weeks after an episode of suspected diverticulitis in order to proceed with colonoscopy.  I offered the patient an option to repeat a CT scan and/or treat empirically for suspected diverticulitis.  She would prefer to back off on her diet and treat empirically and see how she does. 2.  Prescribed Augmentin  875/125 twice daily x 7 days #14 with no refill. 3.  Recommend the patient keep her stools regular  if the Linseed oil is working she can continue or start MiraLAX daily 4.  Recommend she back off to a clear liquid diet for the next couple of days and then increase to a low fiber diet until she no longer is experiencing pain.  Provided her handout handouts on this. 5.  Patient will call and let me know how she is doing.  She does need a colonoscopy but it will need to be scheduled 6 to 8 weeks out from finishing antibiotics presuming that she has no further abdominal pain.  She is aware. 6.  Patient to follow in clinic with us  as needed.  Delon Failing, PA-C [facility] 07/23/2024, 8:22 AM  Cc: Theotis Haze ORN, NP

## 2024-07-23 NOTE — Progress Notes (Signed)
 Agree with assessment and plan as outlined. Agree with treating for suspected recurrent diverticulitis with antibiotics, postponing colonoscopy for now at least another 6 to 8 weeks given recurrent symptoms.  Jan can you make sure to chart check in 3 weeks or so to see if she has contacted us  and been scheduled for colonoscopy, pending her course?  Thanks

## 2024-07-27 ENCOUNTER — Other Ambulatory Visit: Payer: Self-pay

## 2024-08-05 ENCOUNTER — Ambulatory Visit
Admission: RE | Admit: 2024-08-05 | Discharge: 2024-08-05 | Disposition: A | Discharge status: Home / Self Care | Payer: Self-pay | Source: Ambulatory Visit | Attending: Obstetrics and Gynecology | Admitting: Obstetrics and Gynecology

## 2024-08-05 ENCOUNTER — Ambulatory Visit: Payer: Self-pay | Admitting: *Deleted

## 2024-08-05 VITALS — BP 112/76 | Wt 148.4 lb

## 2024-08-05 DIAGNOSIS — Z1239 Encounter for other screening for malignant neoplasm of breast: Secondary | ICD-10-CM

## 2024-08-05 DIAGNOSIS — N6324 Unspecified lump in the left breast, lower inner quadrant: Secondary | ICD-10-CM

## 2024-08-05 DIAGNOSIS — Z87898 Personal history of other specified conditions: Secondary | ICD-10-CM

## 2024-08-05 NOTE — Progress Notes (Signed)
 [name] is a 48 y.o. female who presents to [facility] today with complaint of right breast lump that that was worked up 10/26/2015 that resolved and came back a couple of months later around January 2022. Patient stated there is no changes with the lump. Patient states the lump drains a white fluid at times. Patient has complained of pain in the past. Patient denied pain today. Patients last mammogram was completed 07/17/2023 that was probably benign that a diagnostic mammogram was recommended in one year. .    Pap Smear: Pap smear not completed today. Last Pap smear was 07/11/2022 at [facility] and was normal with negative HPV. Patients previous Pap smears 01/25/2021, 07/20/2019, 01/30/2017, and 07/25/2016 were normal with negative HPV. Patient has a history of an abnormal Pap smear 10/06/2015 at [facility] that showed endocervical adenocarcinoma in situ and positive HPV. Patient had a colposcopy 11/22/2015 that showed Adenocarcinoma in situ and a LEEP 01/08/2016 that showed Adenocarcinoma with negative margins for follow up. Patient also has a history of a Pap smear 10/06/2014 that was normal and positive for HPV. A colposcopy was completed to follow up 11/03/2014. The above results are available in [name]    Physical exam: Breasts Breasts symmetrical. No skin abnormalities bilateral breasts. No nipple retraction bilateral breasts. No nipple discharge bilateral breasts. No lymphadenopathy. No lumps palpated left breast. Palpated a lump at 8 o'clock on the areola next to the nipple that is consistent with previous finding on diagnostic mammogram 10/26/2015, previous exam 01/25/2021, and previous exam 07/11/2022. Complaints of left lower breast tenderness on exam.      MS 3D DIAG MAMMO BILAT BR (aka MM) Result Date: 07/17/2023 CLINICAL DATA:  48 year old female presenting for annual exam and short-term follow-up of a probably benign right breast mass. EXAM: DIGITAL DIAGNOSTIC  BILATERAL MAMMOGRAM WITH TOMOSYNTHESIS AND CAD; ULTRASOUND RIGHT BREAST LIMITED TECHNIQUE: Bilateral digital diagnostic mammography and breast tomosynthesis was performed. The images were evaluated with computer-aided detection. ; Targeted ultrasound examination of the right breast was performed COMPARISON:  Previous exam(s). ACR Breast Density Category c: The breasts are heterogeneously dense, which may obscure small masses. FINDINGS: Mammogram: Right breast: There is a stable small obscured mass best seen on MLO view in the upper right breast. There are no new suspicious findings elsewhere in the right breast. Left breast: No suspicious mass, distortion, or microcalcifications are identified to suggest presence of malignancy. Ultrasound: Targeted ultrasound is performed in the right breast at 12 o'clock 7 cm from the nipple demonstrating an oval circumscribed hypoechoic mass measuring 0.7 x 0.4 x 0.7 cm, previously measuring 0.7 x 0.4 x 0.7 cm. IMPRESSION: 1. Stable probably benign right breast mass at 12 o'clock. 2. No mammographic evidence of malignancy in the left breast. RECOMMENDATION: Diagnostic bilateral mammogram and right breast ultrasound in 1 year. I have discussed the findings and recommendations with the patient. If applicable, a reminder letter will be sent to the patient regarding the next appointment. BI-RADS CATEGORY  3: Probably benign. Electronically Signed   By: Inocente Ast M.D.   On: 07/17/2023 11:05   MS DIGITAL DIAG TOMO UNI RIGHT Result Date: 07/29/2022 CLINICAL DATA:  Recall from screening to evaluate a possible mass an asymmetry over the right breast. EXAM: DIGITAL DIAGNOSTIC UNILATERAL RIGHT MAMMOGRAM WITH TOMOSYNTHESIS; ULTRASOUND RIGHT BREAST LIMITED TECHNIQUE: Right digital diagnostic mammography and breast tomosynthesis was performed.; Targeted ultrasound examination of the right breast was performed COMPARISON:  Previous exam(s). ACR Breast Density Category  c: The breast  tissue is heterogeneously dense, which may obscure small masses. FINDINGS: Additional images of the right breast were obtained. There is a persistent oval circumscribed mass over the upper central right breast. The questionable asymmetry over the outer right breast does not persist on additional images. Targeted ultrasound is performed, showing an oval circumscribed hypoechoic to anechoic mass over the 12 o'clock position of the right breast 7 cm from the nipple corresponding to the mammographic finding. This measures 5 x 7 x 8 mm. This likely represents a minimally complicated cyst and less likely a solid mass. Several smaller cysts are present over the 12 o'clock position of the right breast closer to the nipple during real-time sonographic evaluation. IMPRESSION: 8 mm probable complicated cyst over the 12 o'clock position of the right breast. RECOMMENDATION: Recommend follow-up diagnostic right breast ultrasound 6 months. I have discussed the findings and recommendations with the patient via an in-person interpreter. If applicable, a reminder letter will be sent to the patient regarding the next appointment. BI-RADS CATEGORY  3: Probably benign. Electronically Signed   By: Toribio Agreste M.D.   On: 07/29/2022 12:31  MS DIGITAL SCREENING TOMO BILATERAL Result Date: 07/15/2022 CLINICAL DATA:  Screening. EXAM: DIGITAL SCREENING BILATERAL MAMMOGRAM WITH TOMOSYNTHESIS AND CAD TECHNIQUE: Bilateral screening digital craniocaudal and mediolateral oblique mammograms were obtained. Bilateral screening digital breast tomosynthesis was performed. The images were evaluated with computer-aided detection. COMPARISON:  Previous exam(s). ACR Breast Density Category c: The breast tissue is heterogeneously dense, which may obscure small masses. FINDINGS: In the right breast, a possible mass and possible asymmetry warrants further evaluation. In the left breast, no findings suspicious for malignancy. IMPRESSION: Further evaluation  is suggested for a possible mass and possible asymmetry in the right breast. RECOMMENDATION: Diagnostic mammogram and possibly ultrasound of the right breast. (Code:[id number]-R-65M) The patient will be contacted regarding the findings, and additional imaging will be scheduled. BI-RADS CATEGORY  0: Incomplete. Need additional imaging evaluation and/or prior mammograms for comparison. Electronically Signed   By: Leita Mattocks M.D.   On: 07/15/2022 14:06   MS DIGITAL DIAG TOMO BILAT Result Date: 01/25/2021 CLINICAL DATA:  48 year old female with a palpable area of concern in the periareolar right breast which has been present for many years, previously imaged in 2016 and characterized as a Montgomery gland cyst. Patient is frequently able to express yellow discharge from the small mass in the skin of the right breast. EXAM: DIGITAL DIAGNOSTIC BILATERAL MAMMOGRAM WITH TOMOSYNTHESIS AND CAD; ULTRASOUND LEFT BREAST LIMITED; ULTRASOUND RIGHT BREAST LIMITED TECHNIQUE: Bilateral digital diagnostic mammography and breast tomosynthesis was performed. The images were evaluated with computer-aided detection.; Targeted ultrasound examination of the left breast was performed; Targeted ultrasound examination of the right breast was performed COMPARISON:  Previous exams. ACR Breast Density Category c: The breast tissue is heterogeneously dense, which may obscure small masses. FINDINGS: There is an oval mass corresponding to the palpable abnormality in the periareolar right breast measuring approximately 0.7 cm. There is an asymmetry seen in the slightly inferior left breast which spreads out on the additional images felt to be related to normal/dense fibroglandular tissue. Physical examination of the right breast reveals a mass within the skin of the lower outer areola containing a small yellow papule. Targeted ultrasound of the right breast was performed. There is a oval circumscribed hypoechoic mass within the skin of the right areola  at the 8 o'clock position measuring 0.7 x 0.5 x 0.8 cm. This is overall similar in appearance  when compared to prior ultrasound from 2016 with findings most compatible with a Montgomery gland cyst. Targeted ultrasound of the entire inferior/central left breast was performed with no suspicious masses or abnormality seen, only extremely dense fibroglandular tissue identified. IMPRESSION: 1. 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 2.  No findings of malignancy in either breast. RECOMMENDATION: 1.  Screening mammogram in one year.(Code:SM-B-01Y) 2. If this Montgomery gland cyst on the right areola continues to enlarge or become infected or bothersome to the patient, then recommend surgical referral for excision. I have discussed the findings and recommendations with the patient. If applicable, a reminder letter will be sent to the patient regarding the next appointment. BI-RADS CATEGORY  2: Benign. Electronically Signed   By: Delon Music M.D.   On: 01/25/2021 10:51    Pelvic/Bimanual Pap is indicated today. Patient is currently on menstrual period. Pap smear scheduled at [facility] clinic on Tuesday, August 10, 2024 at 0830.    Smoking History: Patient has never smoked.   Patient Navigation: Patient education provided. Access to services provided for patient through [place] program. Spanish interpreter Bernice Angry from [facility] provided.   Colorectal Cancer Screening: Per patient has never had colonoscopy completed. Patient completed a FIT test given by her PCP 04/23/2024 that was negative. No complaints today.    Breast and Cervical Cancer Risk Assessment: Patient has a family history of a paternal aunt having breast cancer. Patient has no known genetic mutations or history of radiation treatment to the chest before age 81. Patient has a history of cervical dysplasia. Patient has no history of being immunocompromised or DES exposure in-utero.   Risk Scores  as of Encounter on 08/05/2024     Alisa           5-year 0.94%   Lifetime 10.47%   This patient is Hispana/Latina but has no documented birth country, so the Foot of Ten model used data from [place] patients to calculate their risk score. Document a birth country in the Demographics activity for a more accurate score.         Last calculated by Logan Lyle BRAVO, CMA on 08/05/2024 at  8:49 AM        A: BCCCP exam without pap smear Complaint of right breast lump.   P: Referred patient to the [facility] for a diagnostic mammogram per recommendation. Appointment scheduled Thursday, August 05, 2024 at 1010.   Driscilla Wanda SQUIBB, RN 08/05/2024 9:07 AM

## 2024-08-05 NOTE — Patient Instructions (Signed)
 Explained breast self awareness with Alicia Miller. Pap is indicated today. Patient is currently on menstrual period. Pap smear scheduled at [facility] clinic on Tuesday, August 10, 2024 at 0830. Referred patient to the [facility] for a diagnostic mammogram per recommendation. Appointment scheduled Thursday, August 05, 2024 at 1010. Patient aware of appointment and will be there. [name] verbalized understanding.  Medhansh Brinkmeier, Wanda Ship, RN 9:08 AM

## 2024-08-10 ENCOUNTER — Other Ambulatory Visit: Payer: Self-pay | Admitting: *Deleted

## 2024-08-10 DIAGNOSIS — Z124 Encounter for screening for malignant neoplasm of cervix: Secondary | ICD-10-CM

## 2024-08-10 NOTE — Patient Instructions (Signed)
 [name]

## 2024-08-10 NOTE — Progress Notes (Signed)
 Patient: Alicia Miller           Date of Birth: [birth year]           MRN: [id number] Visit Date: 08/10/2024 PCP: Theotis Haze ORN, NP  Cervical Cancer Screening Do you smoke?: No Have you ever had or been told you have an allergy to latex products?: No Marital status: Married Date of last pap smear: 1-2 yrs ago Date of last menstrual period: 08/02/24  Cervical Exam  Abnormal Observations: Normal Exam. Recommendations: Last Pap smear was 07/11/2022 at [facility] and was normal with negative HPV. Patients previous Pap smears 01/25/2021, 07/20/2019, 01/30/2017, and 07/25/2016 were normal with negative HPV. 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 Patient had a colposcopy 11/22/2015 that showed Adenocarcinoma in situ and a LEEP 01/08/2016 that showed Adenocarcinoma with negative margins for follow up. Patient also has a history of a Pap smear 10/06/2014 that was normal and positive for HPV. A colposcopy was completed to follow up 11/03/2014. The above results are available in Epic. Let patient know if today's Pap smear is normal and HPV negative that her next Pap smear will be due in one year. Informed patient will follow up with her on Pap smear results within the next couple of weeks by letter or phone. Patient verbalized understanding.    Spanish interpreter Bernice Angry from [facility] provided.  Patient's History Patient Active Problem List   Diagnosis Date Noted   Infected insect bite 06/08/2024   Left thigh pain 06/08/2024   Prediabetes 09/03/2023   Elevated blood pressure reading without diagnosis of hypertension 01/07/2023   Tension headache 08/30/2021   Stress 08/30/2021   Vasculitis 12/17/2019   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   Mood changes 07/28/2019   Dyspareunia in female 07/28/2019   Menorrhagia with regular cycle 07/28/2019   S/P  cholecystectomy 07/28/2019   Nevus, non-neoplastic 08/06/2017   Dermatofibroma 08/06/2017   Seborrheic keratoses 06/12/2017   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   Pruritic erythematous rash 06/12/2017   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   Noted on EMG Dec 2020   No pertinent past medical history    Varicose veins of left leg with edema     Family History  Problem Relation Age of Onset   Diabetes Father    Uterine cancer Sister    Diabetes Sister    Diabetes Paternal Grandmother    Breast cancer Paternal Aunt        @ unknown age  Diabetes Paternal Uncle    Diabetes Paternal Uncle    Diabetes Paternal Uncle    Diabetes Paternal Uncle    Diabetes Paternal Uncle    Diabetes Paternal Uncle    Liver disease Neg Hx    Esophageal cancer Neg Hx    Colon cancer Neg Hx     Social History   Occupational History   Not on file  Tobacco Use   Smoking status: Never   Smokeless tobacco: Never  Vaping Use   Vaping status: Never Used  Substance and Sexual Activity   Alcohol use: No    Drug use: No   Sexual activity: Yes    Birth control/protection: None

## 2024-08-13 LAB — CYTOLOGY - PAP
Adequacy: ABSENT
Comment: NEGATIVE
Diagnosis: NEGATIVE
High risk HPV: NEGATIVE

## 2024-08-17 ENCOUNTER — Ambulatory Visit: Payer: Self-pay

## 2024-08-19 ENCOUNTER — Telehealth: Payer: Self-pay

## 2024-08-19 NOTE — Telephone Encounter (Signed)
-----   Message from [facility] Eschbach H sent at 07/27/2024  5:36 PM EDT ----- Regarding: colonoscopy ? From [facility] on 9-8:  Jan can you make sure to chart check in 3 weeks ( or so to see if she has contacted us  and been scheduled for colonoscopy, pending her course?  Thanks

## 2024-08-19 NOTE — Telephone Encounter (Signed)
 Using the interpretor on the language line called the patient.  Alicia Miller wanted to schedule the colonoscopy.  Patient was scheduled for Thursday, 11-6 with Dr. Leigh. Patient is not on BT or GLP1s.  Alicia Miller was scheduled for a Previsit on 10-30 at 10:30am. Alicia Miller understands to come to the 2nd floor of our building and that Alicia Miller will need a driver for her procedure.

## 2024-09-02 ENCOUNTER — Encounter: Payer: Self-pay | Admitting: Gastroenterology

## 2024-09-16 ENCOUNTER — Ambulatory Visit (AMBULATORY_SURGERY_CENTER): Payer: Self-pay

## 2024-09-16 ENCOUNTER — Other Ambulatory Visit: Payer: Self-pay

## 2024-09-16 VITALS — Ht 62.0 in | Wt 150.0 lb

## 2024-09-16 DIAGNOSIS — Z1211 Encounter for screening for malignant neoplasm of colon: Secondary | ICD-10-CM

## 2024-09-16 MED ORDER — PEG 3350-KCL-NA BICARB-NACL 420 G PO SOLR
4000.0000 mL | Freq: Once | ORAL | 0 refills | Status: AC
  Filled 2024-09-16: qty 4000, 1d supply, fill #0

## 2024-09-16 NOTE — Progress Notes (Signed)
 No egg or soy allergy known to patient  No issues known to pt with past sedation with any surgeries or procedures Patient denies ever being told they had issues or difficulty with intubation  No FH of Malignant Hyperthermia Pt is not on diet pills Pt is not on  home 02  Pt is not on blood thinners  Pt has some issues with constipation, takes no meds  No A fib or A flutter Have any cardiac testing pending--No Pt can ambulate  Pt denies use of chewing tobacco Discussed diabetic I weight loss medication holds Discussed NSAID holds Checked BMI Pt instructed to use Singlecare.com or GoodRx for a price reduction on prep  Patient's chart reviewed  Pre visit completed using Spanish interpreter w/ Davene Maretta)

## 2024-09-17 ENCOUNTER — Telehealth: Payer: Self-pay | Admitting: Nurse Practitioner

## 2024-09-17 NOTE — Telephone Encounter (Signed)
 Call to patient with interpreter [phone] to advised to go to UC or ED unable to reach VM left

## 2024-09-17 NOTE — Telephone Encounter (Signed)
 Copied from CRM [id number]. Topic: Clinical - Refused Triage >> Sep 17, 2024  2:55 PM Joesph B wrote: Patient/caller voiced complaints of Patient is experiencing dizziness, and hands are trembling.    . Declined transfer to triage.

## 2024-09-18 ENCOUNTER — Encounter (HOSPITAL_COMMUNITY): Payer: Self-pay

## 2024-09-18 ENCOUNTER — Ambulatory Visit (HOSPITAL_COMMUNITY)
Admission: EM | Admit: 2024-09-18 | Discharge: 2024-09-18 | Disposition: A | Discharge status: Home / Self Care | Attending: Physician Assistant | Admitting: Physician Assistant

## 2024-09-18 DIAGNOSIS — R5383 Other fatigue: Secondary | ICD-10-CM | POA: Insufficient documentation

## 2024-09-18 DIAGNOSIS — R399 Unspecified symptoms and signs involving the genitourinary system: Secondary | ICD-10-CM | POA: Insufficient documentation

## 2024-09-18 LAB — BASIC METABOLIC PANEL WITH GFR
Anion gap: 10 (ref 5–15)
BUN: 11 mg/dL (ref 6–20)
CO2: 22 mmol/L (ref 22–32)
Calcium: 8.9 mg/dL (ref 8.9–10.3)
Chloride: 103 mmol/L (ref 98–111)
Creatinine, Ser: 0.63 mg/dL (ref 0.44–1.00)
GFR, Estimated: >60 mL/min (ref >60)
Glucose, Bld: 86 mg/dL (ref 70–99)
Potassium: 3.9 mmol/L (ref 3.5–5.1)
Sodium: 135 mmol/L (ref 135–145)

## 2024-09-18 LAB — CBC
HCT: 42.7 % (ref 36.0–46.0)
Hemoglobin: 13.8 g/dL (ref 12.0–15.0)
MCH: 26.4 pg (ref 26.0–34.0)
MCHC: 32.3 g/dL (ref 30.0–36.0)
MCV: 81.6 fL (ref 80.0–100.0)
Platelets: 429 K/uL — ABNORMAL HIGH (ref 150–400)
RBC: 5.23 MIL/uL — ABNORMAL HIGH (ref 3.87–5.11)
RDW: 13.3 % (ref 11.5–15.5)
WBC: 8.3 K/uL (ref 4.0–10.5)
nRBC: 0 % (ref 0.0–0.2)

## 2024-09-18 LAB — POCT URINALYSIS DIP (MANUAL ENTRY)
Bilirubin, UA: NEGATIVE
Glucose, UA: NEGATIVE mg/dL
Ketones, POC UA: NEGATIVE mg/dL
Leukocytes, UA: NEGATIVE
Nitrite, UA: NEGATIVE
Protein Ur, POC: NEGATIVE mg/dL
Spec Grav, UA: 1.02 (ref 1.010–1.025)
Urobilinogen, UA: 0.2 U/dL
pH, UA: 6 (ref 5.0–8.0)

## 2024-09-18 LAB — POCT FASTING CBG KUC MANUAL ENTRY: POCT Glucose (KUC): 108 mg/dL — AB (ref 70–99)

## 2024-09-18 NOTE — Discharge Instructions (Addendum)
 Your urinalysis today in clinic was normal. We will call with lab results if abnormal. Make sure you are drinking plenty of fluids Recommend a follow-up appointment with your primary care physician.  Su anlisis de orina de hoy en la clnica fue normal.  Le llamaremos con los [place] de laboratorio si son anormales.  Asegrese de beber mucho lquido.  Le recomendamos una cita de seguimiento con su mdico de cabecera.

## 2024-09-18 NOTE — ED Provider Notes (Signed)
 [facility]    CSN: [id number] Arrival date & time: 09/18/24  1003      History   Chief Complaint Chief Complaint  Patient presents with   Dysuria   Dizziness    HPI Alicia Miller is a 48 y.o. female.   Patient presents with about 1 month of intermittent fatigue, dizziness, numbness and tingling to her hands and bilateral lower extremities.  She reports her menstrual cycle has been irregular over the last few months.  States she had increased bleeding and August.  She denies abdominal pain, fever, chills, shortness of breath, palpitations, chest pain.  She does have a primary care but reports they advised her to be seen in urgent care.  She reports this morning experiencing some mild dysuria and dark urine.    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   Noted on EMG Dec 2020   No pertinent past medical history    Varicose veins of left leg with edema     Patient Active Problem List   Diagnosis Date Noted   Infected insect bite 06/08/2024   Left thigh pain 06/08/2024   Prediabetes 09/03/2023   Elevated blood pressure reading without diagnosis of hypertension 01/07/2023   Tension headache 08/30/2021   Stress 08/30/2021   Vasculitis 12/17/2019   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   Mood changes 07/28/2019   Dyspareunia in female 07/28/2019   Menorrhagia with regular cycle 07/28/2019   S/P cholecystectomy 07/28/2019   Nevus, non-neoplastic 08/06/2017   Dermatofibroma 08/06/2017   Seborrheic keratoses 06/12/2017   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   Pruritic erythematous rash 06/12/2017   Varicose veins of left  lower extremity with complications 04/10/2017   Vitamin D  insufficiency 08/16/2016   Joint laxity of right knee 08/15/2016   Chronic pain of left ankle 08/15/2016   Chronic pain of right ankle 08/15/2016   Fatigue 08/15/2016   Stress incontinence, female 02/12/2016   Breast tenderness in female 02/12/2016   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 pylori infection 04/18/2014    Past Surgical History:  Procedure Laterality Date   CHOLECYSTECTOMY  05/2015   ENDOVENOUS ABLATION SAPHENOUS VEIN W/ LASER Left 12/08/2017   endovenous laser ablation left greater saphenous vein and stab phlebectomy 10-20 incisions left leg by Lynwood Collum MD     OB History     Gravida  2   Para  2   Term  2   Preterm  0   AB  0   Living  2      SAB  0   IAB  0   Ectopic      Multiple  0   Live Births  2            Home Medications    Prior to Admission medications   Medication Sig Start Date End Date Taking? Authorizing Provider  cetirizine  (ZYRTEC ) 10 MG tablet Take 1 tablet (10 mg total) by mouth daily. Patient not taking: Reported  on 08/05/2024 09/03/23   Fleming, Zelda W, NP  fluticasone  (FLONASE ) 50 MCG/ACT nasal spray Place 2 sprays into both nostrils daily. Patient not taking: Reported on 08/05/2024 09/03/23   Fleming, Zelda W, NP  Homeopathic Products (ARNICARE ARTHRITIS PO) Take 2 tablets by mouth at bedtime as needed.    [provider]  hydrocortisone  (ANUSOL -HC) 2.5 % rectal cream Place 1 Application rectally 2 (two) times daily. Patient not taking: Reported on 08/05/2024 06/02/24   [place], PA-C  hydrOXYzine  (ATARAX ) 25 MG tablet Take 1 tablet (25 mg total) by mouth every 6 (six) hours as needed for itching. Patient not taking: Reported on 08/05/2024 06/08/24   Defelice, Rilla, NP   vitamin B-12 (CYANOCOBALAMIN) 100 MCG tablet Take 100 mcg by mouth daily. Patient not taking: Reported on 08/05/2024    [provider]    Family History Family History  Problem Relation Age of Onset   Diabetes Father    Uterine cancer Sister    Diabetes Sister    Breast cancer Paternal Aunt        @ unknown age   Diabetes Paternal Uncle    Diabetes Paternal Uncle    Diabetes Paternal Uncle    Diabetes Paternal Uncle    Diabetes Paternal Uncle    Diabetes Paternal Uncle    Diabetes Paternal Grandmother    Liver disease Neg Hx    Esophageal cancer Neg Hx    Colon cancer Neg Hx    Rectal cancer Neg Hx     Social History Social History   Tobacco Use   Smoking status: Never   Smokeless tobacco: Never  Vaping Use   Vaping status: Never Used  Substance Use Topics   Alcohol use: No   Drug use: No     Allergies   Bactrim  [sulfamethoxazole -trimethoprim ]   Review of Systems Review of Systems  Constitutional:  Positive for fatigue. Negative for chills and fever.  HENT:  Negative for ear pain and sore throat.   Eyes:  Negative for pain and visual disturbance.  Respiratory:  Negative for cough and shortness of breath.   Cardiovascular:  Negative for chest pain and palpitations.  Gastrointestinal:  Negative for abdominal pain and vomiting.  Genitourinary:  Positive for dysuria. Negative for hematuria.  Musculoskeletal:  Negative for arthralgias and back pain.  Skin:  Negative for color change and rash.  Neurological:  Positive for dizziness and numbness. Negative for seizures and syncope.  All other systems reviewed and are negative.    Physical Exam Triage Vital Signs ED Triage Vitals  Encounter Vitals Group     BP 09/18/24 1027 111/75     Girls Systolic BP Percentile --      Girls Diastolic BP Percentile --      Boys Systolic BP Percentile --      Boys Diastolic BP Percentile --      Pulse Rate 09/18/24 1027 75     Resp 09/18/24 1027 14     Temp  09/18/24 1027 97.9 F (36.6 C)     Temp Source 09/18/24 1027 Oral     SpO2 09/18/24 1027 98 %     Weight --      Height --      Head Circumference --      Peak Flow --      Pain Score 09/18/24 1032 3     Pain Loc --      Pain Education --      Exclude from [employer]  Chart --    No data found.  Updated Vital Signs BP 111/75 (BP Location: Left Arm)   Pulse 75   Temp 97.9 F (36.6 C) (Oral)   Resp 14   LMP 07/28/2024   SpO2 98%   Visual Acuity Right Eye Distance:   Left Eye Distance:   Bilateral Distance:    Right Eye Near:   Left Eye Near:    Bilateral Near:     Physical Exam Vitals and nursing note reviewed.  Constitutional:      General: She is not in acute distress.    Appearance: She is well-developed.  HENT:     Head: Normocephalic and atraumatic.  Eyes:     Conjunctiva/sclera: Conjunctivae normal.  Cardiovascular:     Rate and Rhythm: Normal rate and regular rhythm.     Heart sounds: No murmur heard. Pulmonary:     Effort: Pulmonary effort is normal. No respiratory distress.     Breath sounds: Normal breath sounds.  Abdominal:     Palpations: Abdomen is soft.     Tenderness: There is no abdominal tenderness.  Musculoskeletal:        General: No swelling.     Cervical back: Neck supple.  Skin:    General: Skin is warm and dry.     Capillary Refill: Capillary refill takes less than 2 seconds.  Neurological:     Mental Status: She is alert.  Psychiatric:        Mood and Affect: Mood normal.      UC Treatments / Results  Labs (all labs ordered are listed, but only abnormal results are displayed) Labs Reviewed  POCT URINALYSIS DIP (MANUAL ENTRY) - Abnormal; Notable for the following components:      Result Value   Blood, UA small (*)    All other components within normal limits  POCT FASTING CBG [facility] MANUAL ENTRY - Abnormal; Notable for the following components:   POCT Glucose ([facility]) 108 (*)    All other components within normal limits  URINE  CULTURE  CBC  BASIC METABOLIC PANEL WITH GFR    EKG   Radiology No results found.  Procedures Procedures (including critical care time)  Medications Ordered in UC Medications - No data to display  Initial Impression / Assessment and Plan / UC Course  I have reviewed the triage vital signs and the nursing notes.  Pertinent labs & imaging results that were available during my care of the patient were reviewed by me and considered in my medical decision making (see chart for details).     Patient overall well-appearing in no acute distress.  UA normal in clinic today.  Physical exam normal.  Will get blood work including CBC and BMP.  Will also send out urine culture.  Advise follow-up with primary care physician. Final Clinical Impressions(s) / UC Diagnoses   Final diagnoses:  UTI symptoms  Other fatigue     Discharge Instructions      Your urinalysis today in clinic was normal. We will call with lab results if abnormal. Make sure you are drinking plenty of fluids Recommend a follow-up appointment with your primary care physician.  Su anlisis de orina de hoy en la clnica fue normal.  Le llamaremos con los [place] de laboratorio si son anormales.  Asegrese de beber mucho lquido.  Le recomendamos una cita de seguimiento con su mdico de cabecera.     ED Prescriptions   None    PDMP not reviewed this  encounter.   Ward, Harlene PEDLAR, PA-C 09/18/24 1131

## 2024-09-18 NOTE — ED Triage Notes (Addendum)
 Per interpreterGLENWOOD Shape [phone]  Patient reports that she has had dizziness in her legs and hands x 1 month.  Patient also c/o dysuria, low back pain. and dark urine when she urinated this AM.  Patient denies taking any medications for her symptoms.

## 2024-09-19 LAB — URINE CULTURE: Culture: <10000 — AB

## 2024-09-20 ENCOUNTER — Other Ambulatory Visit: Payer: Self-pay

## 2024-09-20 ENCOUNTER — Ambulatory Visit (HOSPITAL_COMMUNITY): Payer: Self-pay

## 2024-09-23 ENCOUNTER — Encounter: Payer: Self-pay | Admitting: Gastroenterology

## 2024-09-23 ENCOUNTER — Ambulatory Visit (AMBULATORY_SURGERY_CENTER): Payer: Self-pay | Admitting: Gastroenterology

## 2024-09-23 VITALS — BP 123/75 | HR 61 | Temp 98.1°F | Resp 14 | Ht 62.0 in | Wt 150.0 lb

## 2024-09-23 DIAGNOSIS — K5732 Diverticulitis of large intestine without perforation or abscess without bleeding: Secondary | ICD-10-CM

## 2024-09-23 DIAGNOSIS — K648 Other hemorrhoids: Secondary | ICD-10-CM

## 2024-09-23 MED ORDER — SODIUM CHLORIDE 0.9 % IV SOLN: 500.0000 mL | INTRAVENOUS | Status: DC

## 2024-09-23 NOTE — Progress Notes (Signed)
 Raquel, Spanish interpreter, used in recovery for all instructions.  Patient and family verbalized understanding of all instructions

## 2024-09-23 NOTE — Progress Notes (Signed)
 Pt's states no medical or surgical changes since previsit or office visit.

## 2024-09-23 NOTE — Progress Notes (Signed)
 Report given to PACU, vss

## 2024-09-23 NOTE — Progress Notes (Signed)
 [facility] History and Physical   Primary Care Physician:  Theotis Haze ORN, NP   Reason for Procedure:   Diverticulitis - first time colonoscopy  Plan:    colonoscopy     HPI: Alicia Miller is a 48 y.o. female  here for colonoscopy screening - history of diverticulitis of the left colon in recent months. First time exam.   Patient denies any bowel symptoms at this time. No family history of colon cancer known. Otherwise feels well without any cardiopulmonary symptoms.   I have discussed risks / benefits of anesthesia and endoscopic procedure with Mikel Zina Florence and they wish to proceed with the exams as outlined today.    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 Authorizing Provider  cetirizine  (ZYRTEC ) 10 MG tablet Take 1 tablet (10 mg total) by mouth daily. Patient not taking: Reported on 04/21/2024 09/03/23   Fleming, Zelda W, NP  fluticasone  (FLONASE ) 50 MCG/ACT nasal spray Place 2 sprays into both nostrils daily. Patient not taking: Reported on 04/21/2024 09/03/23   Fleming, Zelda W, NP  Homeopathic Products (ARNICARE ARTHRITIS PO) Take 2 tablets by mouth at bedtime as needed.     [provider]  hydrocortisone  (ANUSOL -HC) 2.5 % rectal cream Place 1 Application rectally 2 (two) times daily. Patient not taking: No sig reported 06/02/24   [place], PA-C  hydrOXYzine  (ATARAX ) 25 MG tablet Take 1 tablet (25 mg total) by mouth every 6 (six) hours as needed for itching. Patient not taking: No sig reported 06/08/24   Defelice, Rilla, NP  vitamin B-12 (CYANOCOBALAMIN) 100 MCG tablet Take 100 mcg by mouth daily. Patient not taking: No sig reported    [provider]    Current Outpatient Medications  Medication Sig Dispense Refill   cetirizine  (ZYRTEC ) 10 MG tablet Take 1 tablet (10 mg total) by mouth daily. (Patient not taking: Reported on 04/21/2024) 30 tablet 11   fluticasone  (FLONASE ) 50 MCG/ACT nasal spray Place 2 sprays into both nostrils daily. (Patient not taking: Reported on 04/21/2024) 16 g 6   Homeopathic Products (ARNICARE ARTHRITIS PO) Take 2 tablets by mouth at bedtime as needed.     hydrocortisone  (ANUSOL -HC) 2.5 % rectal cream Place 1 Application rectally 2 (two) times daily. (Patient not taking: No sig reported) 30 g 1   hydrOXYzine  (ATARAX ) 25 MG tablet Take 1 tablet (25 mg total) by mouth every 6 (six) hours as needed for itching. (Patient not taking: No sig reported) 12 tablet 0   vitamin B-12 (CYANOCOBALAMIN) 100 MCG tablet Take 100 mcg by mouth daily. (Patient not taking: No sig reported)  Current Facility-Administered Medications  Medication Dose Route Frequency Provider Last Rate Last Admin   0.9 %  sodium chloride  infusion  500 mL Intravenous Continuous Kei Mcelhiney, Elspeth SQUIBB, MD        Allergies as of 09/23/2024 - Review Complete 09/23/2024  Allergen Reaction Noted   Bactrim  [sulfamethoxazole -trimethoprim ] Itching 12/10/2016    Family History  Problem Relation Age of Onset   Diabetes Father    Uterine cancer Sister    Diabetes Sister    Breast cancer Paternal Aunt        @ unknown age   Diabetes Paternal Uncle     Diabetes Paternal Uncle    Diabetes Paternal Uncle    Diabetes Paternal Uncle    Diabetes Paternal Uncle    Diabetes Paternal Uncle    Diabetes Paternal Grandmother    Liver disease Neg Hx    Esophageal cancer Neg Hx    Colon cancer Neg Hx    Rectal cancer Neg Hx    Stomach cancer Neg Hx     Social History   Socioeconomic History   Marital status: Married    Spouse name: Not on file   Number of children: 2   Years of education: Not on file   Highest education level: 6th grade  Occupational History   Not on file  Tobacco Use   Smoking status: Never   Smokeless tobacco: Never  Vaping Use   Vaping status: Never Used  Substance and Sexual Activity   Alcohol use: No   Drug use: No   Sexual activity: Yes    Birth control/protection: None  Other Topics Concern   Not on file  Social History Narrative   Not on file   Social Drivers of Health   Financial Resource Strain: Low Risk  (09/03/2023)   Overall Financial Resource Strain (CARDIA)    Difficulty of Paying Living Expenses: Not hard at all  Food Insecurity: No Food Insecurity (08/05/2024)   Hunger Vital Sign    Worried About Running Out of Food in the Last Year: Never true    Ran Out of Food in the Last Year: Never true  Transportation Needs: No Transportation Needs (08/05/2024)   PRAPARE - Administrator, Civil Service (Medical): No    Lack of Transportation (Non-Medical): No  Physical Activity: Unknown (09/03/2023)   Exercise Vital Sign    Days of Exercise per Week: 0 days    Minutes of Exercise per Session: Not on file  Stress: No Stress Concern Present (09/03/2023)   [employer] of Occupational Health - Occupational Stress Questionnaire    Feeling of Stress : Not at all  Social Connections: Moderately Integrated (09/03/2023)   Social Connection and Isolation Panel    Frequency of Communication with Friends and Family: Once a week    Frequency of Social Gatherings with Friends and Family: Once  a week    Attends Religious Services: 1 to 4 times per year    Active Member of [employer] or Organizations: Yes    Attends Banker Meetings: 1 to 4 times per year    Marital Status: Married  Catering Manager Violence: Not At Risk (09/03/2023)   Humiliation, Afraid, Rape, and Kick questionnaire    Fear of Current or Ex-Partner: No    Emotionally Abused: No    Physically Abused: No    Sexually Abused: No    Review of Systems: All other review of systems negative except as mentioned in the HPI.  Physical  Exam: Vital signs BP 126/78   Pulse 64   Temp 98.1 F (36.7 C) (Temporal)   Resp 18   Ht 5' 2 (1.575 m)   Wt 150 lb (68 kg)   LMP 07/28/2024   SpO2 100%   BMI 27.44 kg/m   General:   Alert,  Well-developed, pleasant and cooperative in NAD Lungs:  Clear throughout to auscultation.   Heart:  Regular rate and rhythm Abdomen:  Soft, nontender and nondistended.   Neuro/Psych:  Alert and cooperative. Normal mood and affect. A and O x 3  Marcey Naval, MD [facility]

## 2024-09-23 NOTE — Op Note (Signed)
  [facility] Patient Name: Alicia Miller Procedure Date: 09/23/2024 9:49 AM MRN: [id number] Endoscopist: Elspeth P. Leigh , MD, [id number] Age: 48 Referring MD:  Date of Birth: 1976/06/04 Gender: Female Account #: [id number] Procedure:                Colonoscopy Indications:              history of diverticulitis - first time colonoscopy Medicines:                Monitored Anesthesia Care Procedure:                Pre-Anesthesia Assessment:                           - Prior to the procedure, a History and Physical                            was performed, and patient medications and                            allergies were reviewed. The patient's tolerance of                            previous anesthesia was also reviewed. The risks                            and benefits of the procedure and the sedation                            options and risks were discussed with the patient.                            All questions were answered, and informed consent                            was obtained. Prior Anticoagulants: The patient has                            taken no anticoagulant or antiplatelet agents. ASA                            Grade Assessment: II - A patient with mild systemic                            disease. After reviewing the risks and benefits,                            the patient was deemed in satisfactory condition to                            undergo the procedure.                           After obtaining informed consent, the colonoscope  was passed under direct vision. Throughout the                            procedure, the patient's blood pressure, pulse, and                            oxygen saturations were monitored continuously. The                            [id number]-HQ190L Colonoscope [id number] was introduced                            through the anus and advanced to the the cecum,                             identified by appendiceal orifice and ileocecal                            valve. The colonoscopy was performed without                            difficulty. The patient tolerated the procedure                            well. The quality of the bowel preparation was                            good. The ileocecal valve, appendiceal orifice, and                            rectum were photographed. Scope In: 10:01:14 AM Scope Out: 10:18:34 AM Scope Withdrawal Time: 0 hours 14 minutes 6 seconds  Total Procedure Duration: 0 hours 17 minutes 20 seconds  Findings:                 The perianal and digital rectal examinations were                            normal.                           Scattered diverticula were found in the sigmoid                            colon, transverse colon and hepatic flexure.                           Internal hemorrhoids were found during                            retroflexion. The hemorrhoids were small.                           The exam was otherwise without abnormality. Complications:            No immediate complications. Estimated blood loss:  None. Estimated Blood Loss:     Estimated blood loss: none. Impression:               - Diverticulosis in the sigmoid colon, in the                            transverse colon and at the hepatic flexure.                           - Internal hemorrhoids.                           - The examination was otherwise normal.                           - No polyps. Recommendation:           - Patient has a contact number available for                            emergencies. The signs and symptoms of potential                            delayed complications were discussed with the                            patient. Return to normal activities tomorrow.                            Written discharge instructions were provided to the                            patient.                           -  Resume previous diet.                           - Continue present medications.                           - Repeat colonoscopy in 10 years for screening                            purposes.                           - Contact us  in the interim if recurrent symptoms                            of diverticulitis Clerance Umland P. Angelyne Terwilliger, MD 09/23/2024 10:23:43 AM This report has been signed electronically.

## 2024-09-23 NOTE — Patient Instructions (Signed)
 YOU HAD AN ENDOSCOPIC PROCEDURE TODAY AT THE [facility]:   Refer to the procedure report that was given to you for any specific questions about what was found during the examination.  If the procedure report does not answer your questions, please call your gastroenterologist to clarify.  If you requested that your care partner not be given the details of your procedure findings, then the procedure report has been included in a sealed envelope for you to review at your convenience later.  YOU SHOULD EXPECT: Some feelings of bloating in the abdomen. Passage of more gas than usual.  Walking can help get rid of the air that was put into your GI tract during the procedure and reduce the bloating. If you had a lower endoscopy (such as a colonoscopy or flexible sigmoidoscopy) you may notice spotting of blood in your stool or on the toilet paper. If you underwent a bowel prep for your procedure, you may not have a normal bowel movement for a few days.  Please Note:  You might notice some irritation and congestion in your nose or some drainage.  This is from the oxygen used during your procedure.  There is no need for concern and it should clear up in a day or so.  SYMPTOMS TO REPORT IMMEDIATELY:  Following lower endoscopy (colonoscopy or flexible sigmoidoscopy):  Excessive amounts of blood in the stool  Significant tenderness or worsening of abdominal pains  Swelling of the abdomen that is new, acute  Fever of 100F or higher  Following upper endoscopy (EGD)  Vomiting of blood or coffee ground material  New chest pain or pain under the shoulder blades  Painful or persistently difficult swallowing  New shortness of breath  Fever of 100F or higher  Black, tarry-looking stools  Resume previous diet Continue present medications Repeat colonoscopy in 10 years for screening purposes Handout on diverticulosis given  For urgent or emergent issues, a gastroenterologist can be reached at any  hour by calling [phone]. Do not use MyChart messaging for urgent concerns.    DIET:  We do recommend a small meal at first, but then you may proceed to your regular diet.  Drink plenty of fluids but you should avoid alcoholic beverages for 24 hours.  ACTIVITY:  You should plan to take it easy for the rest of today and you should NOT DRIVE or use heavy machinery until tomorrow (because of the sedation medicines used during the test).    FOLLOW UP: Our staff will call the number listed on your records the next business day following your procedure.  We will call around 7:15- 8:00 am to check on you and address any questions or concerns that you may have regarding the information given to you following your procedure. If we do not reach you, we will leave a message.     If any biopsies were taken you will be contacted by phone or by letter within the next 1-3 weeks.  Please call us  at [phone] if you have not heard about the biopsies in 3 weeks.    SIGNATURES/CONFIDENTIALITY: You and/or your care partner have signed paperwork which will be entered into your electronic medical record.  These signatures attest to the fact that that the information above on your After Visit Summary has been reviewed and is understood.  Full responsibility of the confidentiality of this discharge information lies with you and/or your care-partner.

## 2024-09-24 ENCOUNTER — Telehealth: Payer: Self-pay

## 2024-09-24 NOTE — Telephone Encounter (Signed)
 Follow up call to pt, no answer.

## 2024-10-05 ENCOUNTER — Ambulatory Visit: Admitting: Internal Medicine

## 2024-11-10 ENCOUNTER — Telehealth: Payer: Self-pay | Admitting: Nurse Practitioner

## 2024-11-10 NOTE — Telephone Encounter (Signed)
 Contacted pt left vm to confirmed appt  12/24

## 2024-11-12 ENCOUNTER — Ambulatory Visit: Admitting: Nurse Practitioner

## 2024-12-29 ENCOUNTER — Ambulatory Visit: Payer: Self-pay | Admitting: Nurse Practitioner

## 2025-01-14 ENCOUNTER — Ambulatory Visit: Admitting: Nurse Practitioner
